# Patient Record
Sex: Female | Born: 1970 | Race: Black or African American | Hispanic: No | Marital: Single | Smoking: Never smoker
Health system: Southern US, Community
[De-identification: ages and names within clinical notes are randomized; demographics above are authoritative.]

## PROBLEM LIST (undated history)

## (undated) DIAGNOSIS — Z800 Family history of malignant neoplasm of digestive organs: Secondary | ICD-10-CM

## (undated) DIAGNOSIS — E079 Disorder of thyroid, unspecified: Secondary | ICD-10-CM

## (undated) DIAGNOSIS — D259 Leiomyoma of uterus, unspecified: Secondary | ICD-10-CM

## (undated) DIAGNOSIS — Z975 Presence of (intrauterine) contraceptive device: Secondary | ICD-10-CM

## (undated) DIAGNOSIS — K219 Gastro-esophageal reflux disease without esophagitis: Secondary | ICD-10-CM

## (undated) DIAGNOSIS — Z803 Family history of malignant neoplasm of breast: Secondary | ICD-10-CM

## (undated) DIAGNOSIS — F209 Schizophrenia, unspecified: Secondary | ICD-10-CM

## (undated) DIAGNOSIS — E039 Hypothyroidism, unspecified: Secondary | ICD-10-CM

## (undated) HISTORY — DX: Family history of malignant neoplasm of digestive organs: Z80.0

## (undated) HISTORY — DX: Family history of malignant neoplasm of breast: Z80.3

---

## 2007-01-18 DIAGNOSIS — F319 Bipolar disorder, unspecified: Secondary | ICD-10-CM | POA: Insufficient documentation

## 2007-08-19 ENCOUNTER — Inpatient Hospital Stay: Payer: Self-pay | Admitting: Psychiatry

## 2009-01-04 DIAGNOSIS — D259 Leiomyoma of uterus, unspecified: Secondary | ICD-10-CM | POA: Insufficient documentation

## 2010-11-02 ENCOUNTER — Inpatient Hospital Stay: Payer: Self-pay | Admitting: Psychiatry

## 2010-12-02 DIAGNOSIS — R87610 Atypical squamous cells of undetermined significance on cytologic smear of cervix (ASC-US): Secondary | ICD-10-CM | POA: Insufficient documentation

## 2011-05-26 ENCOUNTER — Inpatient Hospital Stay: Payer: Self-pay | Admitting: Psychiatry

## 2011-05-31 LAB — TSH: Thyroid Stimulating Horm: 5.36 u[IU]/mL — ABNORMAL HIGH

## 2013-02-05 LAB — DRUG SCREEN, URINE
Cannabinoid 50 Ng, Ur ~~LOC~~: NEGATIVE (ref ?–50)
Cocaine Metabolite,Ur ~~LOC~~: NEGATIVE (ref ?–300)
MDMA (Ecstasy)Ur Screen: NEGATIVE (ref ?–500)
Methadone, Ur Screen: NEGATIVE (ref ?–300)
Opiate, Ur Screen: NEGATIVE (ref ?–300)
Tricyclic, Ur Screen: NEGATIVE (ref ?–1000)

## 2013-02-05 LAB — CBC
HCT: 42.5 % (ref 35.0–47.0)
HGB: 15.1 g/dL (ref 12.0–16.0)
MCH: 31.3 pg (ref 26.0–34.0)
RBC: 4.82 10*6/uL (ref 3.80–5.20)
RDW: 14.3 % (ref 11.5–14.5)
WBC: 8.1 10*3/uL (ref 3.6–11.0)

## 2013-02-05 LAB — URINALYSIS, COMPLETE
Ketone: NEGATIVE
Ph: 7 (ref 4.5–8.0)
Protein: NEGATIVE
Specific Gravity: 1.002 (ref 1.003–1.030)
Squamous Epithelial: 12
WBC UR: 11 /HPF (ref 0–5)

## 2013-02-05 LAB — COMPREHENSIVE METABOLIC PANEL
Alkaline Phosphatase: 151 U/L — ABNORMAL HIGH (ref 50–136)
Anion Gap: 9 (ref 7–16)
Bilirubin,Total: 0.6 mg/dL (ref 0.2–1.0)
Calcium, Total: 9.7 mg/dL (ref 8.5–10.1)
Creatinine: 0.74 mg/dL (ref 0.60–1.30)
EGFR (African American): >60
EGFR (Non-African Amer.): >60
Osmolality: 255 (ref 275–301)
SGPT (ALT): 22 U/L (ref 12–78)
Sodium: 129 mmol/L — ABNORMAL LOW (ref 136–145)

## 2013-02-05 LAB — TSH: Thyroid Stimulating Horm: 1.96 u[IU]/mL

## 2013-02-05 LAB — ETHANOL: Ethanol: <3 mg/dL

## 2013-02-06 LAB — VALPROIC ACID LEVEL: Valproic Acid: 85 ug/mL

## 2013-02-07 LAB — AMMONIA: Ammonia, Plasma: <25 mcmol/L (ref 11–32)

## 2013-02-08 ENCOUNTER — Inpatient Hospital Stay: Payer: Self-pay | Admitting: Psychiatry

## 2013-02-12 LAB — POTASSIUM: Potassium: 3.6 mmol/L (ref 3.5–5.1)

## 2013-11-11 LAB — CBC
HCT: 39.3 % (ref 35.0–47.0)
HGB: 13.4 g/dL (ref 12.0–16.0)
MCH: 30.8 pg (ref 26.0–34.0)
MCHC: 34.2 g/dL (ref 32.0–36.0)
MCV: 90 fL (ref 80–100)
Platelet: 295 10*3/uL (ref 150–440)
RBC: 4.37 10*6/uL (ref 3.80–5.20)
RDW: 13.8 % (ref 11.5–14.5)
WBC: 8.9 10*3/uL (ref 3.6–11.0)

## 2013-11-11 LAB — COMPREHENSIVE METABOLIC PANEL
ALK PHOS: 111 U/L
Albumin: 3.8 g/dL (ref 3.4–5.0)
Anion Gap: 10 (ref 7–16)
BUN: 2 mg/dL — ABNORMAL LOW (ref 7–18)
Bilirubin,Total: 0.3 mg/dL (ref 0.2–1.0)
CALCIUM: 9.1 mg/dL (ref 8.5–10.1)
CREATININE: 0.8 mg/dL (ref 0.60–1.30)
Chloride: 90 mmol/L — ABNORMAL LOW (ref 98–107)
Co2: 25 mmol/L (ref 21–32)
EGFR (African American): >60
EGFR (Non-African Amer.): >60
Glucose: 132 mg/dL — ABNORMAL HIGH (ref 65–99)
OSMOLALITY: 250 (ref 275–301)
Potassium: 2.6 mmol/L — ABNORMAL LOW (ref 3.5–5.1)
SGOT(AST): 25 U/L (ref 15–37)
SGPT (ALT): 18 U/L (ref 12–78)
SODIUM: 125 mmol/L — AB (ref 136–145)
TOTAL PROTEIN: 8.1 g/dL (ref 6.4–8.2)

## 2013-11-11 LAB — URINALYSIS, COMPLETE
BILIRUBIN, UR: NEGATIVE
Bacteria: NONE SEEN
Glucose,UR: NEGATIVE mg/dL (ref 0–75)
KETONE: NEGATIVE
Nitrite: NEGATIVE
PH: 7 (ref 4.5–8.0)
PROTEIN: NEGATIVE
RBC,UR: 1 /HPF (ref 0–5)
Specific Gravity: 1.001 (ref 1.003–1.030)
Squamous Epithelial: 3

## 2013-11-11 LAB — DRUG SCREEN, URINE
Amphetamines, Ur Screen: NEGATIVE (ref ?–1000)
BENZODIAZEPINE, UR SCRN: NEGATIVE (ref ?–200)
Barbiturates, Ur Screen: NEGATIVE (ref ?–200)
COCAINE METABOLITE, UR ~~LOC~~: NEGATIVE (ref ?–300)
Cannabinoid 50 Ng, Ur ~~LOC~~: NEGATIVE (ref ?–50)
MDMA (Ecstasy)Ur Screen: NEGATIVE (ref ?–500)
Methadone, Ur Screen: NEGATIVE (ref ?–300)
Opiate, Ur Screen: NEGATIVE (ref ?–300)
Phencyclidine (PCP) Ur S: NEGATIVE (ref ?–25)
Tricyclic, Ur Screen: NEGATIVE (ref ?–1000)

## 2013-11-11 LAB — SALICYLATE LEVEL: Salicylates, Serum: <1.7 mg/dL

## 2013-11-11 LAB — ACETAMINOPHEN LEVEL: Acetaminophen: <2 ug/mL

## 2013-11-11 LAB — ETHANOL: Ethanol: <3 mg/dL

## 2013-11-12 ENCOUNTER — Inpatient Hospital Stay: Payer: Self-pay | Admitting: Psychiatry

## 2013-11-12 LAB — PREGNANCY, URINE: Pregnancy Test, Urine: NEGATIVE m[IU]/mL

## 2013-11-14 LAB — BASIC METABOLIC PANEL
Anion Gap: 9 (ref 7–16)
BUN: 3 mg/dL — ABNORMAL LOW (ref 7–18)
CREATININE: 0.88 mg/dL (ref 0.60–1.30)
Calcium, Total: 9.6 mg/dL (ref 8.5–10.1)
Chloride: 97 mmol/L — ABNORMAL LOW (ref 98–107)
Co2: 29 mmol/L (ref 21–32)
EGFR (African American): >60
Glucose: 117 mg/dL — ABNORMAL HIGH (ref 65–99)
OSMOLALITY: 268 (ref 275–301)
Potassium: 3.9 mmol/L (ref 3.5–5.1)
SODIUM: 135 mmol/L — AB (ref 136–145)

## 2014-09-15 NOTE — Consult Note (Signed)
Brief Consult Note: Diagnosis: Schizoaffective disorder bipolar type.   Patient was seen by consultant.   Consult note dictated.   Recommend further assessment or treatment.   Orders entered.   Comments: Tamara [name] has a h/o psychosis and mood instability on Haldol decanoate injections and depakote. She came to the hospital floridly psychotic in spite of good treatment compliance as evidenced by therapeutic VPA level.   PLAN: 1. Mood/Psychosis-.we continue depakote. 100 mg of Haldol decanoate injection due on 9/24. Will add Zyprexa zydid to address psychosis.   2. UTI- it is not impossible that some symptoms stem from it. She is on Cipro.   3. Hyponatremia-per mother, she drinks a lot of fluids. This can be from Depakote. We will limit fluids.   4. Low potassium-KCl supplementation per ER physician.  5. Slurred speech-could be from EPS, high Ammonia or reportedly, dental work. Ammonia level.  6. Psychiatry will follow along.  Electronic Signatures: Orson Slick (MD)  (Signed 15-Sep-14 16:03)  Authored: Brief Consult Note   Last Updated: 15-Sep-14 16:03 by Orson Slick (MD)

## 2014-09-15 NOTE — Consult Note (Signed)
Brief Consult Note: Diagnosis: Schizoaffective disorder bipolar type.   Patient was seen by consultant.   Recommend further assessment or treatment.   Orders entered.   Comments: Pt seen in ED [facility]. She has h/o psychosis and mood instability on Haldol decanoate injections and depakote. She was floridly psychotic and was started on Haldol Dec to control her paranoia. She remains delusional and she has poor insight about her illness.   PLAN: Pt will be admitted to [facility].  Continue meds.  She will receive Haldol Dec on Sept 24th.  Continue Depakote and Zyprexa Zydis.  Staff to monitor her closely.  Electronic Signatures: Jeronimo Norma (MD)  (Signed 16-Sep-14 10:51)  Authored: Brief Consult Note   Last Updated: 16-Sep-14 10:51 by Jeronimo Norma (MD)

## 2014-09-16 NOTE — Discharge Summary (Signed)
PATIENT NAME:  Tamara [name], Tamara [name] MR#:  [id number] DATE OF BIRTH:  February 02, 1971  DATE OF ADMISSION:  11/12/2013 DATE OF DISCHARGE:  11/15/2013  HOSPITAL COURSE: See dictated history and physical for details of admission. A 44 year old woman with a history of schizophrenia and mild mental retardation who comes into the hospital committed by her family. Apparently, she had been increasingly belligerent and bizarre. When first seen in the Emergency Room, she was disorganized and confused and bizarre. She was started back on her usual medication which she has tolerated fine. Here in the hospital, she has been showing no side effects and has been compliant with medication treatment. She has been treated with medication as well as individual and group psychotherapy. She has shown a significant improvement in her symptoms. She is now able to be coherent and lucid in conversation and is not expressing any hostility. Her insight is improved and she agrees to continuing on her current medicine. It is still unclear to me when her last shot of Haldol decanoate was but she should continue getting that on her regular schedule. Otherwise, she should continue her Depakote and olanzapine and Cogentin as she is taking it currently. The patient was counseled about the importance of staying on her medicine and agrees to this and is going to follow up with [place].   MENTAL STATUS EXAM AT DISCHARGE: Casually dressed, reasonably well-groomed woman, looks her stated age, cooperative with the interview. Good eye contact. Normal psychomotor activity. Speech is a little bit rapid but easy to understand. Mood is stated as being good. Thoughts are lucid. No evidence of loosening of associations or delusions. Denies auditory or visual hallucinations. Denies suicidal or homicidal ideation. Shows improved judgment and insight. Intelligence is low average. Short and long-term memory some chronic impairment. Alert and oriented x 4.   DISCHARGE  MEDICATIONS: Depakote 250 mg 3 times a day, Cogentin 0.5 mg twice a day, Haldol decanoate 100 mg intramuscular every 4 weeks on her usual schedule, olanzapine 5 mg in the morning and 15 mg at night, hydroxyzine 50 mg every 6 hours as needed for anxiety, Zantac 150 mg twice a day, levothyroxine 75 mcg once a day, potassium chloride 20 mEq twice a day.   LABORATORY RESULTS: Most recent chemistry panel shows (Dictation Anomaly) <<sodiumMISSING TEXT>> normalizing at 135, potassium normal, (Dictation Anomaly) <<chlorideMISSING TEXT>> 97, glucose 117. Admission chemistry showed low sodium at 125, probably from excessive water drinking, potassium was low at 2.6. Alcohol was negative. Drug screen negative. CBC unremarkable. Urinalysis 1+ leukocyte esterase, no bacteria.   DISPOSITION: The patient will be discharged home with her family and will follow up with [place].   DIAGNOSIS, PRINCIPAL AND PRIMARY:  AXIS I: Schizophrenia, undifferentiated.   SECONDARY DIAGNOSES: AXIS I: No further.  AXIS II: Mild mental retardation.  AXIS III: Chronic gastric discomfort, heartburn. Hypothyroid. 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  AXIS IV: Moderate from chronic illness.  AXIS V: Functioning at time of discharge 55.   ____________________________ Gonzella Lex, MD jtc:cs D: 11/15/2013 44:81:85 ET T: 11/15/2013 20:22:14 ET JOB#: [id number]  cc: Gonzella Lex, MD, <Dictator> Gonzella Lex MD ELECTRONICALLY SIGNED 11/15/2013 22:21

## 2014-09-16 NOTE — Consult Note (Signed)
PATIENT NAME:  Tamara [name], Tamara [name] MR#:  [id number] DATE OF BIRTH:  1970-10-29  DATE Seen   11/13/2013  CONSULTING PHYSICIAN:  Surya K. Challa, MD  SUBJECTIVE: The patient was seen for followup today in [place]. The patient is a 44 year old African American female with a long history of mental illness and had been on [place] on many occasions. This time she was brought back and according to information obtained, she stated that she came to Emergency Room to see her father Roe Rutherford. 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 Today, patient reports that she gave a call to her mother and she was taking a nap and she smiled while talking about the same. She has been compliant with medications and she already states she feels better now.  OBJECTIVE: The patient is still dressed in hospital scrubs, grooming is below average, alert and oriented to place, person, and time with a little prompting and help. Affect is bright and better and she reports that she rested better. This makes her feel better. No agitation. Denies hearing voices and said, "No ma'am." Denies any visual hallucinations and said, "No ma'am." She is glad that she is getting help and she is being helped and she then said, "I want to go home," and started laughing. Cognition is much below average. Recall memory is poor but still she is cooperative. Admits she is resting better and eating better. Denies any suicidal ideas  or plans and realizes the importance of taking her medications. Insight and judgment guarded. Impulse control is poor.   IMPRESSION: Schizoaffective disorder with psychosis.  PLAN: Continue current medications. I talked to the patient about importance with compliance and discussed about coping skills.   ____________________________ Wallace Cullens. Franchot Mimes, MD skc:lt D: 11/13/2013 20:26:59  ET T: 11/13/2013 22:12:23 ET JOB#: [id number]  cc: Arlyn Leak K. Franchot Mimes, MD, <Dictator> Dewain Penning MD ELECTRONICALLY SIGNED 11/14/2013 9:29

## 2014-09-16 NOTE — H&P (Signed)
PATIENT NAME:  Tamara [name], Tamara [name] MR#:  [id number] DATE OF BIRTH:  1971-01-29  DATE OF ADMISSION:  11/12/2013  SEX: Female.  RACE: African American.  INITIAL ASSESSMENT PSYCHIATRIC EVALUATION   IDENTIFYING INFORMATION: The patient is a 44 year old African American female with a long history of mental illness and had been at [facility] on several occasions for the same. The patient is a poor historian and she stated that "The police came and handcuffed me and brought me here. They said they do that to everybody." She put her hands behind her back and she showed me  how they handcuffed her and brought her here. According to information obtained from the chart, patient stated to the emergency room that she came to the ER to see her father, Roe Rutherford. She is agitated at home. 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 According to information obtained from the Emergency Room note, the patient is clearly psychotic and a history of similar episodes in the past and was brought to the hospital on IVC.   HISTORY OF PRESENT ILLNESS: The patient reports that she was just handcuffed and is a poor historian. According to information obtained from IVC, she has hallucinations, mentally unstable and has schizophrenia and she needs help.   PAST PSYCHIATRIC HISTORY: The patient has a long history of mental illness and has been hospitalized for psychosis on many occasions. She was last hospitalized at [facility] in September of 2014 and then she was  psychotic. The first hospitalization was as a teenager at age 37 years. This is her fifth inpatient hospital hold on psychiatry at [facility]   MEDICATIONS: She has been on various medications and in September of 2014 she was discharged on the following medications:  1. Valproic acid 250 mg. 2. depakote 15 mL orally once a day at bedtime for mood stabilization. 3. Cogentin 0.5 mg 1 tablet twice a day for eps 4.  Haldol 100 mg IM once a month for psychosis. 5. Zyprexa 15 mg 1 at bedtime. 6. Vistaril 15 mg 1 p.o. q. 6 hours p.r.n. for anxiety. 7. Ranitidine 15 mg twice a day. 8. Levothyroxine 75 mcg 1 tablet once a day for hypothyroidism.   The patient reports that her mother gives her the medicine and she takes the medicine as she lives with her parents. She gets her Haldol 100 mg IM and her mother gives it to her at home. Patient reports that her psychiatrist is a [place] man who is a very good man and does not know his name and she reports that her next appointment is coming up on Wednesday, that is 11/16/2013 and she said she is going to get a shot at the clinic. She was wrong when she stated her mother gives it. She said that the nurse at the mental health clinic gives it to her. Mother goes and picks up the medicine from the drug store and then she takes the medication to doctor. According to information obtained from the chart, the patient was under the care of Dr. Sherlynn Stalls at [facility]. No history of suicide attempts.   FAMILY HISTORY OF MENTAL ILLNESS: None reported.   FAMILY HISTORY: Raised by parents. Currently she lives with her mother and father and she says they do not abuse her. They take good care of her, give her the medications. When they do not feel like cooking at home, they go to the shelter and eat food. Admits that she graduated  from high school, was in special classes, and never liked school.  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 She quit the job because she moved.  MARRIAGES: Never married.  CHILDREN: No children.  ALCOHOL AND DRUGS: Denies drinking alcohol. Denies street or prescription drug abuse. Denies smoking nicotine cigarettes.  MEDICAL HISTORY: Hypothyroidism and is on medication for the same. GERD and is on medications for the same. No known history of hypertension. No history of diabetes mellitus.  No major surgeries, no major injuries. 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  ALLERGIES: No known drug allergies.  PRIMARY CARE PHYSICIAN: She does not know the name of the primary care physician and said the current one is gone and she going to get a new one.  PHYSICAL EXAMINATION: VITALS SIGNS: Temperature is 97.4, pulse is 74, rhythm regular. Respirations 20 per minute and regular. Blood pressure is 122/80 mmHg.  HEENT: Head is normocephalic, atraumatic. Eyes: PERRLA, fundi are benign. NECK: Supple without any thyromegaly, lymphadenopathy.  CHEST: Normal expansion, normal breath sounds heard.  HEART: Normal S1 without any murmurs or rubs. ABDOMEN: Soft, no organomegaly. Bowel sounds heard. RECTAL: Deferred. PELVIC: Deferred. NEUROLOGIC: Gait is normal. Romberg is negative. Cranial nerves II-XII are grossly intact and normal. DTRs 2+ and normal.   MENTAL STATUS EXAMINATION: The patient is dressed in hospital scrubs. Alert and oriented to person and place. Not exactly to date. She said that she did not know the year and she did not know the date and she said "I don't know, I can't tell you that." She is exactly uncertain about the situation that brought her and all she knows is she was handcuffed and brought here. She is pleasant and cooperative. Makes good eye contact. Speech is normal in rate and rhythm and smiling and cheerful. She denies auditory or visual hallucinations. Denies people being against her. Denies  history of suicidal or homicidal plans and said, "People are always nice to me." She is probably delusional about certain things. Cognition is much below average. Recall and memory is poor and she could recall 1 of the 3 objects and said she cannot do things like that. She said capital of [place] is "[place]" and she says she is from [place], [place], so she did not known it and she did not know the capital of this  country. She cannot do any spelling. She said 4 quarters and 6 dimes and she said there are lots of nickels and that there were 5 nickels in a dollar and there is bunch of pennies. Denies s/h ideas or plans and contracts for safety. I/J guarded. Impulse control is poor.  Imp SAD with psychosis.       Mild Mental disabilities.       Hypothyroid and GERD. REc Start [pt back on her meds and stabilize. Will participate in individual and Group therapy for support and coping skills. Discharge back home to parents when medically and mentally stable with appropriate apts.    ____________________________ Wallace Cullens. Franchot Mimes, MD skc:lt D: 11/12/2013 20:43:32 ET T: 11/12/2013 21:06:28 ET JOB#: [id number]  cc: Arlyn Leak K. Franchot Mimes, MD, <Dictator> Dewain Penning MD ELECTRONICALLY SIGNED 11/13/2013 20:43

## 2014-09-17 NOTE — H&P (Signed)
PATIENT NAME:  Tamara [name], Tamara [name] MR#:  [id number] DATE OF BIRTH:  06-Nov-1970  DATE OF ADMISSION:  05/26/2011  REFERRING PHYSICIAN: Lenise Arena, MD  ATTENDING PHYSICIAN: Orson Slick, MD   IDENTIFYING DATA: Tamara [name] is a 44 year old female with a history of psychosis.   CHIEF COMPLAINT: "I just try to help."  HISTORY OF PRESENT ILLNESS: Tamara [name] was petitioned by her mother for reportedly threatening behavior. The patient is rather disorganized and unable to provide details. She tells me that she did see some Christmas presents in the room, including two flat screen TVs and other items.  They all disappeared, and they are now in [place] in her sister's place. It is unclear why she became upset about Christmas gifts and whether or not this was delusional. I do not have any details about her behavior, and I am not certain at this point whether she was physical with her mother or not. We will continue to attempt to contact the family for details. The patient reports that she has been compliant with her visits to Dr. Leonides Schanz and her medications. She "takes her pill and drinks her liquids." She does not remember the names of her medicines but apparently does not mind taking them at all. In addition, the patient is given and an injection of Prolixin every three weeks which she reminds me of and has no problem to continue on medications. Her Depakote level on admission was within low therapeutic range of 58, confirming her story. The patient is very appropriate on the unit. There are no behavioral problems. She takes medications as prescribed, is very pleasant and polite with staff and peers. She denies any symptoms of depression, anxiety or psychosis. She denies hearing any voices. Her story is unlikely true. She denies drinking alcohol, prescription pill abuse, or illicit substance use.   PAST PSYCHIATRIC HISTORY: She has been hospitalized for psychosis several times. The first hospitalization  was at the age of 58. She was admitted to [facility] three times. Her last hospitalization was in June of 2012. We do not know her medication story, but apparently she has been doing well on a combination of Depakote, Haldol and Prolixin. She is compliant with her visits to Dr. Leonides Schanz. She denies suicide attempt. She does have poor insight into mental illness.   FAMILY PSYCHIATRIC HISTORY: None reported.   PAST MEDICAL HISTORY:  1. Polydipsia.  2. Hypothyroidism. 3. Gastroesophageal reflux disease.   MEDICATIONS ON ADMISSION:  1. Synthroid 75 mcg daily.  2. Cogentin 0.5 b.i.d.   3. Ranitidine 150 mg b.i.d.    4. Depakote syrup 250 mg t.i.d.   5. Haldol 5 mg t.i.d.   6. Hydroxyzine 50 mg, 3 to 4 times daily as needed.  7. Prolixin 25 mg IM every 3 weeks. The patient is uncertain when the last injection was given.   ALLERGIES: No known drug allergies.   SOCIAL HISTORY: She is disabled from psychosis. She lives with her mother. She was briefly employed prior to development of her symptoms. She reports that she was placed in a group home right after she graduated from high school. She did not like it there and felt that people were mean to her. She is perfectly happy living with her mother. Apparently, she is quite low functioning.   REVIEW OF SYSTEMS: CONSTITUTIONAL: No fevers or chills. No weight changes. EYES: No double or blurred vision. ENT: No hearing loss. RESPIRATORY: No shortness of breath or cough. CARDIOVASCULAR: No chest pain  or orthopnea. GASTROINTESTINAL: No abdominal pain, nausea, vomiting, or diarrhea. GU: No incontinence or frequency. ENDOCRINE: No heat or cold intolerance. LYMPHATIC: No anemia or easy bruising. INTEGUMENTARY: No acne or rash. MUSCULOSKELETAL: No muscle or joint pain. NEUROLOGICAL: No tingling or weakness. PSYCHIATRIC: See history of present illness for details.   PHYSICAL EXAMINATION:  VITAL SIGNS: Blood pressure 108/67, pulse 106,  respirations 20, temperature 95.7.   GENERAL: This is a well-developed female in no acute distress.   HEENT: The pupils are equal, round, and reactive to light.  NECK: Supple. No thyromegaly.   LUNGS: Clear to auscultation. No dullness to percussion.   HEART: Regular rhythm and rate. No murmurs, rubs, or gallops.   ABDOMEN: Soft, nontender, nondistended. Positive bowel sounds.   MUSCULOSKELETAL: Normal muscle strength in all extremities. No stiffness or cogwheeling. Normal muscle tone.   SKIN: No rashes or bruises.   LYMPHATIC: No cervical adenopathy.   NEUROLOGIC: Cranial nerves II through XII are intact. Normal gait. Slight resting tremor.  LABORATORY, DIAGNOSTIC AND RADIOLOGICAL DATA:  Chemistries are within normal limits except for sodium of 130. Blood alcohol level is zero.  LFTs are within normal limits.  TSH 4.68. Depakote level 58.  Urine toxicology screen  negative for substances.  CBC within normal limits.  Urinalysis is not suggestive of urinary tract infection.  Pregnancy test in urine was negative.   MENTAL STATUS EXAMINATION ON ADMISSION: The patient is alert and oriented to person and place. She knows it is December. She does not know the exact date. She is clearly confused about the situation. She is pleasant, polite, and cooperative. She is well groomed and casually dressed. She maintains good eye contact. She keeps combing her hair, that is a little overgrown, as we speak. She makes a comment that she needs to get a haircut. Speech is of normal rhythm, rate, and volume. Mood is fine with an odd affect. Thought processing is influenced by her delusional thinking with its own logic. Thought content: She denies suicidal or homicidal ideation and adamantly denies threatening her mother at home. She is delusional and slightly paranoid. She denies auditory or visual hallucinations. Her cognition is impaired. She registers two out of three and recalls two out of three with  help. She cannot name the President. She does not want to spell. Her insight and judgment seem poor.  SUICIDE RISK ASSESSMENT ON ADMISSION: This is a patient with a long history of psychosis but not suicide attempts who reportedly threatened her family at home.   AXIS I: Schizoaffective disorder, bipolar type, versus paranoid schizophrenia.   AXIS II: Deferred.   AXIS III:  1. Hypothyroidism.  2. Gastroesophageal reflux disease. 3. Polydipsia.   AXIS IV: Mental illness, conflict at home.   AXIS V: Global Assessment of Functioning score is 25.   PLAN: The patient was admitted to  Unit for safety, stabilization and medication management. She was initially placed on suicide precautions and was closely monitored for any unsafe behaviors. She underwent full psychiatric and risk assessment. She received pharmacotherapy, individual and group psychotherapy, substance abuse counseling, and support from therapeutic milieu.   1. Violent behavior: This has resolved. The patient is able to contract for safety.  2. Mood and psychosis: We will continue Depakote and Haldol. We will get in touch with Dr. Leonides Schanz to establish if she is due for her next antipsychotic injection.  3. We will continue all medications as prescribed by her primary provider.  4.  Disposition: It is unclear if the patient is allowed to return to her mother. We will obtain collateral data.   ____________________________ Wardell Honour. Bary Leriche, MD jbp:cbb D: 05/26/2011 15:40:24 ET T: 05/26/2011 16:04:52 ET JOB#: [id number]  cc: Alera Quevedo B. Bary Leriche, MD, <Dictator> Clovis Fredrickson MD ELECTRONICALLY SIGNED 06/04/2011 23:35

## 2014-10-03 NOTE — H&P (Signed)
PATIENT NAME:  Tamara [name], Tamara [name] [id number] OF BIRTH:  10/09/1970 OF ADMISSION:  02/08/2013  REFERRING PHYSICIAN: Emergency Room MDPHYSICIAN: Orson Slick, MD  DATA: Tamara [name] is a 44 year old female with a history of schizoaffective disorder.  COMPLAINT: Patient is unable to state.  OF PRESENT ILLNESS: Tamara [name] returns to the hospital floridly psychotic. The mother believes that the patient has not been taking her medications as prescribed for the past week and became disorganized, delusional, believing that Roe Rutherford is her father, and hallucinating. The patient is in the care of Dr. Macy Mis and usually takes her medications without problems when supervised. She has been maintained on Haldol decanoate shots and liquid Depakote. Her Depakote level is therapeutic. Her next shot of 100 mg of Haldol decanoate on 02/16/2013. The patient is not able to provide any details. She does not remember names of her medications due to thought disorganization but she reports good treatment compliance. The patient denies any symptoms of depression or anxiety. She denies having voices but by her mother?s report she has been talking to herself. She has not been sleeping at night. The patient was admitted to [facility] in 2012 under similar circumstances most likely in a hypomanic episode. The patient denies drinking alcohol, prescription pill or illicit substance use.  PSYCHIATRIC HISTORY: She has been hospitalized for psychosis several times. The first hospitalization was at the age of 64. She was admitted to [facility] three times. Her last hospitalization was in June of 2012. She has been tried on Depakote, Haldol and Prolixin. There were no suicide attempts. She is compliant with her visits to [facility].   PSYCHIATRIC HISTORY: None reported.  MEDICAL HISTORY:  1. Polydipsia.  2. Hypothyroidism. Gastroesophageal reflux disease.  ON ADMISSION:  1. Synthroid 75 mcg daily.  2. Cogentin 0.5  twice daily.   Ranitidine syrup 150 mg twice daily.    Depakote syrup 750 mg at bedtime.    Haldol 0.5 mg twice daily.   Hydroxyzine 50 mg 4 times daily as needed.  Haldol decanoate 100 mg every 4 weeks. Next injection on 02/16/2013.   No known drug allergies.  HISTORY: She is disabled from psychosis. She lives with her mother. She was briefly employed prior to development of her symptoms. She reports that she was placed in a group home right after she graduated from high school. She did not like it there and felt that people were mean to her. She is perfectly happy living with her mother. Apparently, she is quite low functioning.  OF SYSTEMS: No fevers or chills. No weight changes. No double or blurred vision. No hearing loss. No shortness of breath or cough. No chest pain or orthopnea. No abdominal pain, nausea, vomiting, or diarrhea. No incontinence or frequency. No heat or cold intolerance. No anemia or easy bruising. No acne or rash. No muscle or joint pain. No tingling or weakness. See history of present illness for details.  EXAMINATION: SIGNS: Blood pressure 127/69, pulse 102, respirations 20, temperature 99.3. GENERAL: This is a well-developed female in no acute distress. The pupils are equal, round, and reactive to light. Supple. No thyromegaly. Clear to auscultation. No dullness to percussion. Regular rhythm and rate. No murmurs, rubs, or gallops. Soft, nontender, nondistended. Positive bowel sounds. MUSCULOSKELETAL: Normal muscle strength in all extremities. No stiffness or cogwheeling. Normal muscle tone. No rashes or bruises. No cervical adenopathy. Cranial nerves II through XII are intact. Normal gait.  DIAGNOSTIC AND RADIOLOGICAL DATA: Chemistries are within normal limits except for  sodium of 129, potassium 2.9. Blood ammonia 25.  Blood alcohol level is zero. LFTs are within normal limits except AP 151. TSH 1.96. VPA level 85. Urine toxicology screen negative for substances. CBC within normal  limits. Urinalysis is suggestive of urinary tract infection. Pregnancy test in urine negative.  STATUS EXAMINATION ON ADMISSION: The patient is alert and oriented to person and place. She knows it is fall, September. She does not know the exact date. She is uncertain about the situation. She recognizes me from previous admission. She is pleasant, polite, and cooperative. She is well groomed wearing hospital scrubs. She maintains good eye contact. Speech is of normal rhythm, rate, and volume. Mood is "fine" with odd affect. Thought process is influenced by delusional thinking with its own logic. Thought content: She denies suicidal or homicidal ideation. She is delusional and slightly paranoid. She denies auditory or visual hallucinations. Her cognition is impaired. She registers two out of three and recalls two out of three with help. She cannot name the President. She does not want to spell. Her insight and judgment seem poor.  RISK ASSESSMENT ON ADMISSION: This is a patient with a long history of psychosis but not suicide attempts who return to the hospital floridly psychotic apparently in spite of good medication compliance.  DIAGNOSES:I:  Schizoaffective disorder bipolar type.II:  Deferred. III:  Hypothyroidism, Gastroesophageal reflux disease, Polydipsia. IV:  Mental illness.  V:  Global Assessment of Functioning on admission 35.  The patient was admitted to [facility] for safety, stabilization and medication management. She was initially placed on suicide precautions and was closely monitored for any unsafe behaviors. She underwent full psychiatric and risk assessment. She received pharmacotherapy, individual and group psychotherapy, substance abuse counseling, and support from therapeutic milieu.   1. Mood/psychosis: We continued Depakote, VPA level was therapeutic. We will likely give haldol decanoate injection every 3 weeks instead every 4 weeks rather  than to increase the dose. She was started on Zyprexa zydis in the ER for additional antipsychotic and mood stabilizing effects.  Hyponatremia/Hypokalemia: This is likely from a combination of potomania and sodium lowering effect of Depakote. We started potassium supplementation. Will attempt to limit fluid intake.   Medical: We continue Synthroid and Ranitidine as prescribed by her primary provider.   UTI: She was started on Cipro in the ER.    Social: The patient is incompetent adult and her mother, Steve Gregg is the guardian.   Disposition: She will return to home with the mother. She will follow up with [facility].    Electronic Signatures: Orson Slick (MD)  (Signed on 17-Sep-14 03:23)  Authored  Last Updated: 17-Sep-14 03:23 by Orson Slick (MD)

## 2015-02-13 DIAGNOSIS — E031 Congenital hypothyroidism without goiter: Secondary | ICD-10-CM | POA: Diagnosis present

## 2015-03-05 DIAGNOSIS — Z3042 Encounter for surveillance of injectable contraceptive: Secondary | ICD-10-CM | POA: Insufficient documentation

## 2015-09-09 ENCOUNTER — Encounter: Payer: Self-pay | Admitting: *Deleted

## 2015-09-09 ENCOUNTER — Emergency Department
Admission: EM | Admit: 2015-09-09 | Discharge: 2015-09-10 | Disposition: A | Discharge status: Other Acute Inpatient Hospital | Payer: Medicaid Other | Attending: Emergency Medicine | Admitting: Emergency Medicine

## 2015-09-09 DIAGNOSIS — F200 Paranoid schizophrenia: Secondary | ICD-10-CM

## 2015-09-09 DIAGNOSIS — F203 Undifferentiated schizophrenia: Secondary | ICD-10-CM | POA: Diagnosis not present

## 2015-09-09 DIAGNOSIS — Z79899 Other long term (current) drug therapy: Secondary | ICD-10-CM | POA: Insufficient documentation

## 2015-09-09 DIAGNOSIS — F209 Schizophrenia, unspecified: Secondary | ICD-10-CM | POA: Diagnosis not present

## 2015-09-09 DIAGNOSIS — R451 Restlessness and agitation: Secondary | ICD-10-CM | POA: Diagnosis present

## 2015-09-09 HISTORY — DX: Schizophrenia, unspecified: F20.9

## 2015-09-09 HISTORY — DX: Disorder of thyroid, unspecified: E07.9

## 2015-09-09 LAB — URINE DRUG SCREEN, QUALITATIVE (ARMC ONLY)
Amphetamines, Ur Screen: NOT DETECTED
BARBITURATES, UR SCREEN: NOT DETECTED
Benzodiazepine, Ur Scrn: NOT DETECTED
COCAINE METABOLITE, UR ~~LOC~~: NOT DETECTED
Cannabinoid 50 Ng, Ur ~~LOC~~: NOT DETECTED
MDMA (Ecstasy)Ur Screen: NOT DETECTED
METHADONE SCREEN, URINE: NOT DETECTED
OPIATE, UR SCREEN: NOT DETECTED
Phencyclidine (PCP) Ur S: NOT DETECTED
Tricyclic, Ur Screen: NOT DETECTED

## 2015-09-09 LAB — CBC WITH DIFFERENTIAL/PLATELET
Basophils Absolute: 0 10*3/uL (ref 0–0.1)
Basophils Relative: 0 %
EOS PCT: 1 %
Eosinophils Absolute: 0.1 10*3/uL (ref 0–0.7)
HCT: 39 % (ref 35.0–47.0)
Hemoglobin: 13.4 g/dL (ref 12.0–16.0)
LYMPHS ABS: 3 10*3/uL (ref 1.0–3.6)
LYMPHS PCT: 29 %
MCH: 28.4 pg (ref 26.0–34.0)
MCHC: 34.4 g/dL (ref 32.0–36.0)
MCV: 82.6 fL (ref 80.0–100.0)
MONO ABS: 1.3 10*3/uL — AB (ref 0.2–0.9)
Monocytes Relative: 13 %
Neutro Abs: 5.9 10*3/uL (ref 1.4–6.5)
Neutrophils Relative %: 57 %
PLATELETS: 282 10*3/uL (ref 150–440)
RBC: 4.73 MIL/uL (ref 3.80–5.20)
RDW: 13.8 % (ref 11.5–14.5)
WBC: 10.4 10*3/uL (ref 3.6–11.0)

## 2015-09-09 LAB — POCT PREGNANCY, URINE: PREG TEST UR: NEGATIVE

## 2015-09-09 LAB — COMPREHENSIVE METABOLIC PANEL
ALT: 27 U/L (ref 14–54)
AST: 34 U/L (ref 15–41)
Albumin: 4.5 g/dL (ref 3.5–5.0)
Alkaline Phosphatase: 95 U/L (ref 38–126)
Anion gap: 11 (ref 5–15)
CHLORIDE: 96 mmol/L — AB (ref 101–111)
CO2: 23 mmol/L (ref 22–32)
CREATININE: 0.84 mg/dL (ref 0.44–1.00)
Calcium: 9.7 mg/dL (ref 8.9–10.3)
GFR calc Af Amer: >60 mL/min (ref >60)
GFR calc non Af Amer: >60 mL/min (ref >60)
Glucose, Bld: 135 mg/dL — ABNORMAL HIGH (ref 65–99)
Potassium: 3.3 mmol/L — ABNORMAL LOW (ref 3.5–5.1)
SODIUM: 130 mmol/L — AB (ref 135–145)
Total Bilirubin: 0.7 mg/dL (ref 0.3–1.2)
Total Protein: 8 g/dL (ref 6.5–8.1)

## 2015-09-09 LAB — URINALYSIS COMPLETE WITH MICROSCOPIC (ARMC ONLY)
BACTERIA UA: NONE SEEN
BILIRUBIN URINE: NEGATIVE
Glucose, UA: NEGATIVE mg/dL
Ketones, ur: NEGATIVE mg/dL
Nitrite: NEGATIVE
Protein, ur: NEGATIVE mg/dL
Specific Gravity, Urine: 1.001 — ABNORMAL LOW (ref 1.005–1.030)
pH: 7 (ref 5.0–8.0)

## 2015-09-09 LAB — ETHANOL: Alcohol, Ethyl (B): <5 mg/dL (ref <5)

## 2015-09-09 LAB — ACETAMINOPHEN LEVEL: Acetaminophen (Tylenol), Serum: <10 ug/mL — ABNORMAL LOW (ref 10–30)

## 2015-09-09 LAB — SALICYLATE LEVEL: Salicylate Lvl: <4 mg/dL (ref 2.8–30.0)

## 2015-09-09 MED ORDER — SODIUM CHLORIDE 0.9 % IV BOLUS (SEPSIS)
1000.0000 mL | Freq: Once | INTRAVENOUS | Status: AC
  Administered 2015-09-10: 1000 mL via INTRAVENOUS

## 2015-09-09 MED ORDER — DIPHENHYDRAMINE HCL 50 MG/ML IJ SOLN
INTRAMUSCULAR | Status: AC
  Administered 2015-09-09: 50 mg via INTRAMUSCULAR
  Filled 2015-09-09: qty 1

## 2015-09-09 MED ORDER — LORAZEPAM 2 MG/ML IJ SOLN
INTRAMUSCULAR | Status: AC
  Administered 2015-09-09: 2 mg via INTRAMUSCULAR
  Filled 2015-09-09: qty 1

## 2015-09-09 MED ORDER — FOSFOMYCIN TROMETHAMINE 3 G PO PACK
3.0000 g | PACK | Freq: Once | ORAL | Status: AC
  Administered 2015-09-10: 3 g via ORAL
  Filled 2015-09-09: qty 3

## 2015-09-09 MED ORDER — POTASSIUM CHLORIDE CRYS ER 20 MEQ PO TBCR
40.0000 meq | EXTENDED_RELEASE_TABLET | Freq: Once | ORAL | Status: AC
  Administered 2015-09-10: 40 meq via ORAL
  Filled 2015-09-09: qty 2

## 2015-09-09 MED ORDER — HALOPERIDOL LACTATE 5 MG/ML IJ SOLN
INTRAMUSCULAR | Status: AC
  Administered 2015-09-09: 5 mg via INTRAMUSCULAR
  Filled 2015-09-09: qty 1

## 2015-09-09 NOTE — ED Provider Notes (Addendum)
[facility] Emergency Department Provider Note  ____________________________________________  Time seen: Approximately 11:13 PM  I have reviewed the triage vital signs and the nursing notes.   HISTORY  Chief Complaint Medical Clearance    HPI Tamara [name] is a 45 y.o. female brought to the ED by her mother with a chief complaint of agitation and requesting medication adjustment. Patient has a history of schizophrenia who has been experiencing labile mood swings and agitation as well as paranoia. Mother reports patient becomes unbalanced approximately once per year necessitating medication adjustments. Patient currently voices no medical concerns. Denies active SI/HI/AH/VH. Required IM sedation prior to my interview and exam.   Past Medical History  Diagnosis Date  . Thyroid disease   . Schizophrenia ([place])     There are no active problems to display for this patient.   History reviewed. No pertinent past surgical history.  Current Outpatient Rx  Name  Route  Sig  Dispense  Refill  . benztropine (COGENTIN) 0.5 MG tablet   Oral   Take 0.5 mg by mouth 2 (two) times daily.         . cetirizine (ZYRTEC) 10 MG tablet   Oral   Take 10 mg by mouth daily.         . haloperidol (HALDOL) 0.5 MG tablet   Oral   Take 0.5 mg by mouth 2 (two) times daily.         . haloperidol decanoate (HALDOL DECANOATE) 100 MG/ML injection   Intramuscular   Inject 100 mg into the muscle every 28 (twenty-eight) days.         . hydrOXYzine (ATARAX/VISTARIL) 50 MG tablet   Oral   Take 50 mg by mouth every 6 (six) hours as needed.         Marland Kitchen levothyroxine (SYNTHROID, LEVOTHROID) 75 MCG tablet   Oral   Take 75 mcg by mouth daily before breakfast.         . olanzapine zydis (ZYPREXA) 15 MG disintegrating tablet   Oral   Take 15 mg by mouth at bedtime.         . potassium chloride SA (K-DUR,KLOR-CON) 20 MEQ tablet   Oral   Take 20 mEq by mouth 2 (two)  times daily.         . ranitidine (ZANTAC) 15 MG/ML syrup   Oral   Take by mouth 2 (two) times daily. 2 teaspoon fulls         . Valproate Sodium (VALPROIC ACID) 250 MG/5ML SOLN   Oral   Take 45 mLs by mouth at bedtime.           Allergies Review of patient's allergies indicates no known allergies.  History reviewed. No pertinent family history.  Social History Social History  Substance Use Topics  . Smoking status: Never Smoker   . Smokeless tobacco: None  . Alcohol Use: No    Review of Systems  Constitutional: No fever/chills. Eyes: No visual changes. ENT: No sore throat. Cardiovascular: Denies chest pain. Respiratory: Denies shortness of breath. Gastrointestinal: No abdominal pain.  No nausea, no vomiting.  No diarrhea.  No constipation. Genitourinary: Negative for dysuria. Musculoskeletal: Negative for back pain. Skin: Negative for rash. Neurological: Negative for headaches, focal weakness or numbness. Psychiatric:Positive for agitation.  10-point ROS otherwise negative.  ____________________________________________   PHYSICAL EXAM:  VITAL SIGNS: ED Triage Vitals  Enc Vitals Group     BP 09/09/15 2049 167/91 mmHg     Pulse  Rate 09/09/15 2049 144     Resp 09/09/15 2049 24     Temp 09/09/15 2049 99 F (37.2 C)     Temp Source 09/09/15 2049 Oral     SpO2 09/09/15 2049 99 %     Weight 09/09/15 2049 202 lb (91.627 kg)     Height 09/09/15 2049 5\' 2"  (1.575 m)     Head Cir --      Peak Flow --      Pain Score --      Pain Loc --      Pain Edu? --      Excl. in [place]? --     Constitutional: Alert and oriented. Well appearing and in no acute distress. Eyes: Conjunctivae are normal. PERRL. EOMI. Head: Atraumatic. Nose: No congestion/rhinnorhea. Mouth/Throat: Mucous membranes are moist.  Oropharynx non-erythematous. Neck: No stridor.   Cardiovascular: Normal rate, regular rhythm. Grossly normal heart sounds.  Good peripheral  circulation. Respiratory: Normal respiratory effort.  No retractions. Lungs CTAB. Gastrointestinal: Soft and nontender. No distention. No abdominal bruits. No CVA tenderness. Musculoskeletal: No lower extremity tenderness nor edema.  No joint effusions. Neurologic:  Normal speech and language. No gross focal neurologic deficits are appreciated. No gait instability. Skin:  Skin is warm, dry and intact. No rash noted. Psychiatric: Mood and affect are flat. Speech and behavior are flat. Exhibiting flight of ideas.  ____________________________________________   LABS (all labs ordered are listed, but only abnormal results are displayed)  Labs Reviewed  COMPREHENSIVE METABOLIC PANEL - Abnormal; Notable for the following:    Sodium 130 (*)    Potassium 3.3 (*)    Chloride 96 (*)    Glucose, Bld 135 (*)    BUN <5 (*)    All other components within normal limits  CBC WITH DIFFERENTIAL/PLATELET - Abnormal; Notable for the following:    Monocytes Absolute 1.3 (*)    All other components within normal limits  ACETAMINOPHEN LEVEL - Abnormal; Notable for the following:    Acetaminophen (Tylenol), Serum <10 (*)    All other components within normal limits  URINALYSIS COMPLETEWITH MICROSCOPIC ([facility] ONLY) - Abnormal; Notable for the following:    Color, Urine STRAW (*)    APPearance CLEAR (*)    Specific Gravity, Urine 1.001 (*)    Hgb urine dipstick 2+ (*)    Leukocytes, UA 1+ (*)    Squamous Epithelial / LPF 0-5 (*)    All other components within normal limits  ETHANOL  SALICYLATE LEVEL  URINE DRUG SCREEN, QUALITATIVE ([facility] ONLY)  POC URINE PREG, ED  POCT PREGNANCY, URINE   ____________________________________________  EKG  None ____________________________________________  RADIOLOGY  None ____________________________________________   PROCEDURES  Procedure(s) performed: None  Critical Care performed: No  ____________________________________________   INITIAL  IMPRESSION / ASSESSMENT AND PLAN / ED COURSE  Pertinent labs & imaging results that were available during my care of the patient were reviewed by me and considered in my medical decision making (see chart for details).  45 year old female with a history of schizophrenia who presents for agitation. Patient required IM sedation upon her arrival. She is currently calm and cooperative. Laboratory urinalysis results remarkable for hyponatremia and mild hypokalemia; will initiate IV fluid resuscitation. Urinalysis reveals mild UTI for which we will administer fosfomycin. At this time patient is medically cleared for psychiatric evaluation and disposition.  ----------------------------------------- 8:14 AM on 09/10/2015 -----------------------------------------  Patient was transferred to [facility] pending psychiatry evaluation. No events overnight. Resting in no acute distress.  Disposition per psychiatry. ____________________________________________   FINAL CLINICAL IMPRESSION(S) / ED DIAGNOSES  Final diagnoses:  Schizophrenia, unspecified type ([place])      Paulette Blanch, MD 09/10/15 [place], MD 09/10/15 [phone]

## 2015-09-09 NOTE — ED Notes (Signed)
BEHAVIORAL HEALTH ROUNDING  Patient sleeping: No.  Patient alert and oriented: yes  Behavior appropriate: Yes. ; If no, describe:  Nutrition and fluids offered: Yes  Toileting and hygiene offered: Yes  Sitter present: not applicable, Q 15 min safety rounds and observation.  Law enforcement present: Yes ODS  

## 2015-09-09 NOTE — ED Notes (Signed)

## 2015-09-09 NOTE — ED Notes (Signed)
Pt has flight of ideas and is unable to answer health related questions. Pt is accompanied by mother. Pt is yelling about her children being taken from her, being unwilling to stay in ED, people taking her money. Pt is agitated and somewhat uncooperative. Mother reports hx of psychiatric conditions and that she has taken her home meds daily.

## 2015-09-10 ENCOUNTER — Inpatient Hospital Stay
Admission: EM | Admit: 2015-09-10 | Discharge: 2015-09-13 | DRG: 885 | Disposition: A | Discharge status: Home / Self Care | Payer: Medicaid Other | Source: Intra-hospital | Attending: Psychiatry | Admitting: Psychiatry

## 2015-09-10 DIAGNOSIS — F70 Mild intellectual disabilities: Secondary | ICD-10-CM | POA: Diagnosis present

## 2015-09-10 DIAGNOSIS — F200 Paranoid schizophrenia: Secondary | ICD-10-CM

## 2015-09-10 DIAGNOSIS — E031 Congenital hypothyroidism without goiter: Secondary | ICD-10-CM | POA: Diagnosis present

## 2015-09-10 DIAGNOSIS — F209 Schizophrenia, unspecified: Secondary | ICD-10-CM | POA: Diagnosis present

## 2015-09-10 DIAGNOSIS — F22 Delusional disorders: Secondary | ICD-10-CM | POA: Diagnosis present

## 2015-09-10 DIAGNOSIS — Z9114 Patient's other noncompliance with medication regimen: Secondary | ICD-10-CM

## 2015-09-10 DIAGNOSIS — L219 Seborrheic dermatitis, unspecified: Secondary | ICD-10-CM | POA: Diagnosis present

## 2015-09-10 DIAGNOSIS — R631 Polydipsia: Secondary | ICD-10-CM | POA: Diagnosis present

## 2015-09-10 DIAGNOSIS — Z975 Presence of (intrauterine) contraceptive device: Secondary | ICD-10-CM | POA: Diagnosis not present

## 2015-09-10 DIAGNOSIS — R4586 Emotional lability: Secondary | ICD-10-CM | POA: Diagnosis present

## 2015-09-10 DIAGNOSIS — E876 Hypokalemia: Secondary | ICD-10-CM | POA: Diagnosis present

## 2015-09-10 DIAGNOSIS — F203 Undifferentiated schizophrenia: Secondary | ICD-10-CM

## 2015-09-10 DIAGNOSIS — K219 Gastro-esophageal reflux disease without esophagitis: Secondary | ICD-10-CM | POA: Diagnosis present

## 2015-09-10 DIAGNOSIS — E221 Hyperprolactinemia: Secondary | ICD-10-CM | POA: Diagnosis present

## 2015-09-10 DIAGNOSIS — F54 Psychological and behavioral factors associated with disorders or diseases classified elsewhere: Secondary | ICD-10-CM

## 2015-09-10 DIAGNOSIS — E871 Hypo-osmolality and hyponatremia: Secondary | ICD-10-CM | POA: Diagnosis present

## 2015-09-10 HISTORY — DX: Leiomyoma of uterus, unspecified: D25.9

## 2015-09-10 HISTORY — DX: Gastro-esophageal reflux disease without esophagitis: K21.9

## 2015-09-10 HISTORY — DX: Presence of (intrauterine) contraceptive device: Z97.5

## 2015-09-10 MED ORDER — HALOPERIDOL 5 MG PO TABS
5.0000 mg | ORAL_TABLET | Freq: Two times a day (BID) | ORAL | Status: DC
  Administered 2015-09-10: 5 mg via ORAL

## 2015-09-10 MED ORDER — LEVOTHYROXINE SODIUM 75 MCG PO TABS: 75.0000 ug | ORAL_TABLET | Freq: Every day | ORAL | Status: DC

## 2015-09-10 MED ORDER — OLANZAPINE 10 MG PO TABS
5.0000 mg | ORAL_TABLET | Freq: Every day | ORAL | Status: DC
  Administered 2015-09-10: 5 mg via ORAL
  Filled 2015-09-10: qty 1

## 2015-09-10 MED ORDER — DIVALPROEX SODIUM 125 MG PO CSDR
250.0000 mg | DELAYED_RELEASE_CAPSULE | Freq: Two times a day (BID) | ORAL | Status: DC
  Administered 2015-09-10: 250 mg via ORAL
  Filled 2015-09-10: qty 2

## 2015-09-10 MED ORDER — LEVOTHYROXINE SODIUM 75 MCG PO TABS
75.0000 ug | ORAL_TABLET | Freq: Every day | ORAL | Status: DC
  Administered 2015-09-11 – 2015-09-13 (×3): 75 ug via ORAL
  Filled 2015-09-10 (×3): qty 1

## 2015-09-10 MED ORDER — BENZTROPINE MESYLATE 1 MG PO TABS
0.5000 mg | ORAL_TABLET | Freq: Two times a day (BID) | ORAL | Status: DC
  Administered 2015-09-11 – 2015-09-12 (×3): 0.5 mg via ORAL
  Filled 2015-09-10 (×3): qty 1

## 2015-09-10 MED ORDER — PANTOPRAZOLE SODIUM 40 MG PO TBEC
40.0000 mg | DELAYED_RELEASE_TABLET | Freq: Every day | ORAL | Status: DC
  Administered 2015-09-11 – 2015-09-13 (×3): 40 mg via ORAL
  Filled 2015-09-10 (×4): qty 1

## 2015-09-10 MED ORDER — HALOPERIDOL 5 MG PO TABS
5.0000 mg | ORAL_TABLET | Freq: Two times a day (BID) | ORAL | Status: DC
  Administered 2015-09-11: 5 mg via ORAL
  Filled 2015-09-10: qty 1

## 2015-09-10 MED ORDER — PANTOPRAZOLE SODIUM 40 MG PO TBEC
DELAYED_RELEASE_TABLET | ORAL | Status: AC
  Administered 2015-09-10: 40 mg via ORAL
  Filled 2015-09-10: qty 1

## 2015-09-10 MED ORDER — OLANZAPINE 5 MG PO TABS
5.0000 mg | ORAL_TABLET | Freq: Every day | ORAL | Status: DC
Start: 2015-09-10 — End: 2015-09-10

## 2015-09-10 MED ORDER — HALOPERIDOL 5 MG PO TABS
ORAL_TABLET | ORAL | Status: AC
  Administered 2015-09-10: 5 mg via ORAL
  Filled 2015-09-10: qty 1

## 2015-09-10 MED ORDER — MAGNESIUM HYDROXIDE 400 MG/5ML PO SUSP: 30.0000 mL | Freq: Every day | ORAL | Status: DC | PRN

## 2015-09-10 MED ORDER — PANTOPRAZOLE SODIUM 40 MG PO TBEC
40.0000 mg | DELAYED_RELEASE_TABLET | Freq: Every day | ORAL | Status: DC
  Administered 2015-09-10: 40 mg via ORAL

## 2015-09-10 MED ORDER — ALUM & MAG HYDROXIDE-SIMETH 200-200-20 MG/5ML PO SUSP: 30.0000 mL | ORAL | Status: DC | PRN

## 2015-09-10 MED ORDER — BENZTROPINE MESYLATE 0.5 MG PO TABS
ORAL_TABLET | ORAL | Status: AC
Start: 2015-09-10 — End: 2015-09-10
  Administered 2015-09-10: 0.5 mg via ORAL
  Filled 2015-09-10: qty 1

## 2015-09-10 MED ORDER — BENZTROPINE MESYLATE 1 MG PO TABS
0.5000 mg | ORAL_TABLET | Freq: Two times a day (BID) | ORAL | Status: DC
  Administered 2015-09-10: 0.5 mg via ORAL

## 2015-09-10 MED ORDER — ACETAMINOPHEN 325 MG PO TABS: 650.0000 mg | ORAL_TABLET | Freq: Four times a day (QID) | ORAL | Status: DC | PRN

## 2015-09-10 MED ORDER — DIVALPROEX SODIUM 125 MG PO CSDR
250.0000 mg | DELAYED_RELEASE_CAPSULE | Freq: Two times a day (BID) | ORAL | Status: DC
  Administered 2015-09-11: 250 mg via ORAL
  Filled 2015-09-10 (×2): qty 2

## 2015-09-10 NOTE — ED Notes (Addendum)
Patient has been calm and cooperative. Her behavior has been childish at times but this appears to be baseline.  She is currently not expressing any delusional thinking except when directly asked when she offers various stories about "her baby" and other members of the family. She is aware of she is in the hospital and needs to take medication for her "nerves."  Continue all safety precautions.

## 2015-09-10 NOTE — ED Notes (Signed)
Patient resting quietly in room. No noted distress or abnormal behaviors noted. Will continue 15 minute checks and observation by security camera for safety. 

## 2015-09-10 NOTE — ED Notes (Signed)
Patient noted in room. No complaints, stable, in no acute distress. Q15 minute rounds and monitoring via Security Cameras to continue.  

## 2015-09-10 NOTE — ED Notes (Signed)
Patient told that she will be admitted as an inpatient. She was cooperative with taking medications and vital signs. Maintained on 15 minute checks and observation by security camera for safety.

## 2015-09-10 NOTE — ED Notes (Signed)
Patient remains calm and cooperative. She is still expressing some delusional thoughts regarding "her baby" but is easily redirected.  Maintained on 15 minute checks and observation by security camera for safety.

## 2015-09-10 NOTE — BH Assessment (Signed)
Assessment Note  Tamara [name] is an 45 y.o. female. Tamara [name] arrived to the the ED She states that her mom is mad at her because she "got with her man back in the day and had a baby with him and she brought him home in the incubator today and her mother would not hold the baby," She shared the baby's father is dead. She states a friend is taking care of the baby now and will bring the baby around sometimes. She states that she has a rich father and he is having a ball for her and she has to go get dressed.  Patient was not coherent at the time of the interview.  Tamara [name]'s mother reports that she had a nervous break down again.  She has had them since age 64.  Mother states that it is hereditary. Mother states "She was thinking she had a child, her and her mother were lovers, that mother stole her money, that [place] on the bill was making her pregnant.  She is not recognizing pictures of family members or family members in the room. She was argumentative with her mother. Mother states that she was fine in church this morning. Mother states that she has been this way for the past 3 days. Mother states that last month she had an episode that lasted 2 weeks, but they did not bring her due to her being brought in by the police the last time, and she had a hard time afterwards due to being handcuffed. Mother shared that when symptoms were present this time she brought her to the hospital. Mother reports multiple hospitalizations. She reports that she is having auditory and visual hallucination.  She is reported as reacting and responding to internal stimuli. Mother states that she has developmental delay. She is unaware of her IQ.  Mother Tamara [name]) [phone] is her legal guardian.    Diagnosis: Schizophrenia  Past Medical History:  Past Medical History  Diagnosis Date  . Thyroid disease   . Schizophrenia ([place])     History reviewed. No pertinent past surgical  history.  Family History: History reviewed. No pertinent family history.  Social History:  reports that she has never smoked. She does not have any smokeless tobacco history on file. She reports that she does not drink alcohol or use illicit drugs.  Additional Social History:     CIWA: CIWA-Ar BP: (!) 167/91 mmHg Pulse Rate: (!) 144 COWS:    Allergies: No Known Allergies  Home Medications:  (Not in a hospital admission)  OB/GYN Status:  No LMP recorded.  General Assessment Data Location of Assessment: [facility] ED TTS Assessment: In system Is this a Tele or Face-to-Face Assessment?: Face-to-Face Is this an Initial Assessment or a Re-assessment for this encounter?: Initial Assessment Marital status: Single Maiden name: n/a Is patient pregnant?: No Pregnancy Status: No Living Arrangements: Parent Can pt return to current living arrangement?: Yes Admission Status: Voluntary Is patient capable of signing voluntary admission?: No Referral Source: Self/Family/Friend Insurance type: Medicaid  Medical Screening Exam ([place]) Medical Exam completed: Yes  Crisis Care Plan Living Arrangements: Parent Legal Guardian: Mother Tamara [name] [phone]) Name of Psychiatrist: Dr. Lilian Coma Name of Therapist: None at this time  Education Status Is patient currently in school?: No Current Grade: n/a Highest grade of school patient has completed: 12th Name of school: [employer] person: n/a  Risk to self with the past 6 months Suicidal Ideation: No Has patient been a  risk to self within the past 6 months prior to admission? : No Suicidal Intent: No Has patient had any suicidal intent within the past 6 months prior to admission? : No Is patient at risk for suicide?: No Suicidal Plan?: No Has patient had any suicidal plan within the past 6 months prior to admission? : No Access to Means: No What has been your use of drugs/alcohol within the last 12  months?: none  Previous Attempts/Gestures: No How many times?: 0 Other Self Harm Risks: denied Triggers for Past Attempts: None known Intentional Self Injurious Behavior: None Family Suicide History: No Recent stressful life event(s):  (None reported) Persecutory voices/beliefs?: No Depression: No Depression Symptoms:  (denied) Substance abuse history and/or treatment for substance abuse?: No Suicide prevention information given to non-admitted patients: Not applicable  Risk to Others within the past 6 months Homicidal Ideation: No Does patient have any lifetime risk of violence toward others beyond the six months prior to admission? : No Thoughts of Harm to Others: No Current Homicidal Intent: No Current Homicidal Plan: No Access to Homicidal Means: No Identified Victim: None identified History of harm to others?: No Assessment of Violence: None Noted Violent Behavior Description: none reported Does patient have access to weapons?: No Criminal Charges Pending?: No Does patient have a court date: No Is patient on probation?: No  Psychosis Hallucinations: Auditory, Visual Delusions:  (None)  Mental Status Report Appearance/Hygiene: In scrubs Motor Activity: Unremarkable Speech: Slow Level of Consciousness: Alert Mood: Pleasant Affect: Flat Anxiety Level: Minimal Thought Processes: Tangential Judgement: Impaired Orientation: Not oriented Obsessive Compulsive Thoughts/Behaviors: None  Cognitive Functioning Concentration: Poor Memory: Unable to Assess IQ: Below Average Insight: Poor Impulse Control: Poor Appetite: Poor Sleep: Decreased Vegetative Symptoms: None  ADLScreening [facility] Assessment Services) [name] cognitive ability adequate to safely complete daily activities?: Yes Patient able to express need for assistance with ADLs?: Yes Independently performs ADLs?: Yes (appropriate for developmental age)  Prior Inpatient Therapy Prior Inpatient Therapy:  Yes Prior Therapy Dates: Several times since age 15 last time was in  August 2016 Prior Therapy Facilty/Provider(s): [place], [facility] Reason for Treatment: Schizophrenia  Prior Outpatient Therapy Prior Outpatient Therapy: Yes Prior Therapy Dates: Current Prior Therapy Facilty/Provider(s): Dr. Lilian Coma Reason for Treatment: Schizophrenia Does patient have an ACCT team?: No Does patient have Intensive In-House Services?  : No Does patient have [facility] services? : No Does patient have P4CC services?: No  ADL Screening (condition at time of admission) [name] cognitive ability adequate to safely complete daily activities?: Yes Patient able to express need for assistance with ADLs?: Yes Independently performs ADLs?: Yes (appropriate for developmental age)                  Additional Information 1:1 In Past 12 Months?: No CIRT Risk: No Elopement Risk: No Does patient have medical clearance?: Yes     Disposition:  Disposition Initial Assessment Completed for this Encounter: Yes Disposition of Patient: Other dispositions  On Site Evaluation by:   Reviewed with Physician:    Elmer Bales 09/10/2015 12:35 AM

## 2015-09-10 NOTE — ED Notes (Signed)
BEHAVIORAL HEALTH ROUNDING  Patient sleeping: No.  Patient alert and oriented: yes  Behavior appropriate: Yes. ; If no, describe:  Nutrition and fluids offered: Yes  Toileting and hygiene offered: Yes  Sitter present: not applicable, Q 15 min safety rounds and observation.  Law enforcement present: Yes ODS  

## 2015-09-10 NOTE — ED Notes (Signed)
Report received from Ann, RN.

## 2015-09-10 NOTE — ED Notes (Signed)
Patient watching television in the dayroom. In no apparent distress. Maintained on 15 minute checks and observation by security camera for safety.

## 2015-09-10 NOTE — ED Provider Notes (Signed)
-----------------------------------------   5:35 PM on 09/10/2015 -----------------------------------------  Patient has been seen and evaluated by psychiatry, they plan to admit to their service once a bed is available.  Harvest Dark, MD 09/10/15 [phone]

## 2015-09-10 NOTE — ED Notes (Addendum)

## 2015-09-10 NOTE — Progress Notes (Signed)
Pt. To BHU from ED ambulatory without difficulty, to room .Pt. Is alert and oriented, warm and dry in no distress. Pt. denies SI, HI, and AVH. Pt. Remains calm and cooperative. Pt. made aware of security cameras and Q15 minute rounds. Pt. encouraged to let Nursing staff know of any concerns or needs.

## 2015-09-10 NOTE — ED Notes (Signed)
Patient asleep in room. No noted distress or abnormal behavior. Will continue 15 minute checks and observation by security cameras for safety. 

## 2015-09-10 NOTE — ED Notes (Signed)
Report called to Caren Griffins, RN in the ED BHU.

## 2015-09-10 NOTE — ED Notes (Signed)
Report received from [facility] RN. Patient alert and oriented, warm and dry, in no acute distress. Patient denies SI, HI, AVH and pain. Patient made aware of Q15 minute rounds and security cameras for their safety. Patient instructed to come to me with needs or concerns.

## 2015-09-10 NOTE — Consult Note (Signed)
[facility] Face-to-Face Psychiatry Consult   Reason for Consult:  Consult for 45 year old woman with a history of schizophrenia and developmental disability who was dropped off here by her mother because of psychosis. Referring Physician:  Edd Fabian Patient Identification: Tamara [name] MRN:  [id number] Principal Diagnosis: Schizophrenia [facility]) Diagnosis:   Patient Active Problem List   Diagnosis Date Noted  . Schizophrenia ([place]) [F20.9] 09/10/2015  . Thyroid disease [E07.9] 09/10/2015    Total Time spent with patient: 1 hour  Subjective:   Tamara [name] is a 45 y.o. female patient admitted with "I just had a baby and my mother won't pick her up".  HPI:  Patient interviewed. Chart reviewed including old psychiatric hospital notes. Labs reviewed. 45 year old woman who has a history of mental retardation and schizophrenia. Dropped off by her mother with reports that her hallucinations delusions and strange behavior have been worse. The patient is not a very reliable historian but does say that she has a baby that was born sometime last year and she wants to go pick it up but her mother will not help her. Prior history it seems clear that there was no baby born last year. 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 The patient doesn't have any insight into this. Denies being down or depressed. Says that she sleeps okay. Denies suicidal or homicidal ideation. She says that she takes her medicine for her nerves but she can't remember what they are or give me any more detail about it. She is pretty cognitively limited. Ref medical history: History of hypothyroidism.  Substance abuse history: Denies any alcohol or drug abuse no past history of substance abuse problems.  Social history: Apparently lives with her mother. I don't know if there is anyone else in the home regularly right now. Patient is clearly disabled.  Past Psychiatric History: Past history of recurrent episodes of psychosis similar  to this. Mother has reported that the patient will decompensate about once a year and sometimes needs her medicines adjusted. There've been plenty of times in the past when she was lucid and not focused on her delusions. This seems to be a worsening right now. No known history of suicide attempts or violence.  Risk to Self: Suicidal Ideation: No Suicidal Intent: No Is patient at risk for suicide?: No Suicidal Plan?: No Access to Means: No What has been your use of drugs/alcohol within the last 12 months?: none  How many times?: 0 Other Self Harm Risks: denied Triggers for Past Attempts: None known Intentional Self Injurious Behavior: None Risk to Others: Homicidal Ideation: No Thoughts of Harm to Others: No Current Homicidal Intent: No Current Homicidal Plan: No Access to Homicidal Means: No Identified Victim: None identified History of harm to others?: No Assessment of Violence: None Noted Violent Behavior Description: none reported Does patient have access to weapons?: No Criminal Charges Pending?: No Does patient have a court date: No Prior Inpatient Therapy: Prior Inpatient Therapy: Yes Prior Therapy Dates: Several times since age 19 last time was in  August 2016 Prior Therapy Facilty/Provider(s): [facility], Gas[facility] Col[facility] Reason for Treatment: Schizophrenia Prior Outpatient Therapy: Prior Outpatient Therapy: Yes Prior Therapy Dates: Current Prior Therapy Facilty/Provider(s): Dr. Lilian Coma Reason for Treatment: Schizophrenia Does patient have an ACCT team?: No Does patient have Intensive In-House Services?  : No Does patient have [facility] services? : No Does patient have P4CC services?: No  Past Medical History:  Past Medical History  Diagnosis Date  . Thyroid disease   .  Schizophrenia ([place])    History reviewed. No pertinent past surgical history. Family History: History reviewed. No pertinent family history. Family Psychiatric  History: Patient is not able to give any  family history at all. Social History:  History  Alcohol Use No     History  Drug Use No    Social History   Social History  . Marital Status: Single    Spouse Name: N/A  . Number of Children: N/A  . Years of Education: N/A   Social History Main Topics  . Smoking status: Never Smoker   . Smokeless tobacco: None  . Alcohol Use: No  . Drug Use: No  . Sexual Activity: Yes    Birth Control/ Protection: Surgical   Other Topics Concern  . None   Social History Narrative  . None   Additional Social History:    Allergies:  No Known Allergies  Labs:  Results for orders placed or performed during the hospital encounter of 09/09/15 (from the past 48 hour(s))  Comprehensive metabolic panel     Status: Abnormal   Collection Time: 09/09/15  9:07 PM  Result Value Ref Range   Sodium 130 (L) 135 - 145 mmol/L   Potassium 3.3 (L) 3.5 - 5.1 mmol/L   Chloride 96 (L) 101 - 111 mmol/L   CO2 23 22 - 32 mmol/L   Glucose, Bld 135 (H) 65 - 99 mg/dL   BUN <5 (L) 6 - 20 mg/dL   Creatinine, Ser 0.84 0.44 - 1.00 mg/dL   Calcium 9.7 8.9 - 10.3 mg/dL   Total Protein 8.0 6.5 - 8.1 g/dL   Albumin 4.5 3.5 - 5.0 g/dL   AST 34 15 - 41 U/L   ALT 27 14 - 54 U/L   Alkaline Phosphatase 95 38 - 126 U/L   Total Bilirubin 0.7 0.3 - 1.2 mg/dL   GFR calc non Af Amer >60 >60 mL/min   GFR calc Af Amer >60 >60 mL/min    Comment: (NOTE) The eGFR has been calculated using the CKD EPI equation. This calculation has not been validated in all clinical situations. eGFR's persistently <60 mL/min signify possible Chronic Kidney Disease.    Anion gap 11 5 - 15  Ethanol     Status: None   Collection Time: 09/09/15  9:07 PM  Result Value Ref Range   Alcohol, Ethyl (B) <5 <5 mg/dL    Comment:        LOWEST DETECTABLE LIMIT FOR SERUM ALCOHOL IS 5 mg/dL FOR MEDICAL PURPOSES ONLY   CBC with Diff     Status: Abnormal   Collection Time: 09/09/15  9:07 PM  Result Value Ref Range   WBC 10.4 3.6 - 11.0 K/uL    RBC 4.73 3.80 - 5.20 MIL/uL   Hemoglobin 13.4 12.0 - 16.0 g/dL   HCT 39.0 35.0 - 47.0 %   MCV 82.6 80.0 - 100.0 fL   MCH 28.4 26.0 - 34.0 pg   MCHC 34.4 32.0 - 36.0 g/dL   RDW 13.8 11.5 - 14.5 %   Platelets 282 150 - 440 K/uL   Neutrophils Relative % 57 %   Neutro Abs 5.9 1.4 - 6.5 K/uL   Lymphocytes Relative 29 %   Lymphs Abs 3.0 1.0 - 3.6 K/uL   Monocytes Relative 13 %   Monocytes Absolute 1.3 (H) 0.2 - 0.9 K/uL   Eosinophils Relative 1 %   Eosinophils Absolute 0.1 0 - 0.7 K/uL   Basophils Relative 0 %  Basophils Absolute 0.0 0 - 0.1 K/uL  Salicylate level     Status: None   Collection Time: 09/09/15  9:07 PM  Result Value Ref Range   Salicylate Lvl <6.0 2.8 - 30.0 mg/dL  Acetaminophen level     Status: Abnormal   Collection Time: 09/09/15  9:07 PM  Result Value Ref Range   Acetaminophen (Tylenol), Serum <10 (L) 10 - 30 ug/mL    Comment:        THERAPEUTIC CONCENTRATIONS VARY SIGNIFICANTLY. A RANGE OF 10-30 ug/mL MAY BE AN EFFECTIVE CONCENTRATION FOR MANY PATIENTS. HOWEVER, SOME ARE BEST TREATED AT CONCENTRATIONS OUTSIDE THIS RANGE. ACETAMINOPHEN CONCENTRATIONS >150 ug/mL AT 4 HOURS AFTER INGESTION AND >50 ug/mL AT 12 HOURS AFTER INGESTION ARE OFTEN ASSOCIATED WITH TOXIC REACTIONS.   Urine Drug Screen, Qualitative ([facility] only)     Status: None   Collection Time: 09/09/15  9:07 PM  Result Value Ref Range   Tricyclic, Ur Screen NONE DETECTED NONE DETECTED   Amphetamines, Ur Screen NONE DETECTED NONE DETECTED   MDMA (Ecstasy)Ur Screen NONE DETECTED NONE DETECTED   Cocaine Metabolite,Ur [place] NONE DETECTED NONE DETECTED   Opiate, Ur Screen NONE DETECTED NONE DETECTED   Phencyclidine (PCP) Ur S NONE DETECTED NONE DETECTED   Cannabinoid 50 Ng, Ur [place] NONE DETECTED NONE DETECTED   Barbiturates, Ur Screen NONE DETECTED NONE DETECTED   Benzodiazepine, Ur Scrn NONE DETECTED NONE DETECTED   Methadone Scn, Ur NONE DETECTED NONE DETECTED    Comment: (NOTE) 737  Tricyclics,  urine               Cutoff 1000 ng/mL 200  Amphetamines, urine             Cutoff 1000 ng/mL 300  MDMA (Ecstasy), urine           Cutoff 500 ng/mL 400  Cocaine Metabolite, urine       Cutoff 300 ng/mL 500  Opiate, urine                   Cutoff 300 ng/mL 600  Phencyclidine (PCP), urine      Cutoff 25 ng/mL 700  Cannabinoid, urine              Cutoff 50 ng/mL 800  Barbiturates, urine             Cutoff 200 ng/mL 900  Benzodiazepine, urine           Cutoff 200 ng/mL 1000 Methadone, urine                Cutoff 300 ng/mL 1100 1200 The urine drug screen provides only a preliminary, unconfirmed 1300 analytical test result and should not be used for non-medical 1400 purposes. Clinical consideration and professional judgment should 1500 be applied to any positive drug screen result due to possible 1600 interfering substances. A more specific alternate chemical method 1700 must be used in order to obtain a confirmed analytical result.  1800 Gas chromato graphy / mass spectrometry (GC/MS) is the preferred 1900 confirmatory method.   Urinalysis complete, with microscopic ([facility] only)     Status: Abnormal   Collection Time: 09/09/15  9:07 PM  Result Value Ref Range   Color, Urine STRAW (A) YELLOW   APPearance CLEAR (A) CLEAR   Glucose, UA NEGATIVE NEGATIVE mg/dL   Bilirubin Urine NEGATIVE NEGATIVE   Ketones, ur NEGATIVE NEGATIVE mg/dL   Specific Gravity, Urine 1.001 (L) 1.005 - 1.030   Hgb urine dipstick 2+ (A)  NEGATIVE   pH 7.0 5.0 - 8.0   Protein, ur NEGATIVE NEGATIVE mg/dL   Nitrite NEGATIVE NEGATIVE   Leukocytes, UA 1+ (A) NEGATIVE   RBC / HPF 0-5 0 - 5 RBC/hpf   WBC, UA 6-30 0 - 5 WBC/hpf   Bacteria, UA NONE SEEN NONE SEEN   Squamous Epithelial / LPF 0-5 (A) NONE SEEN  Pregnancy, urine POC     Status: None   Collection Time: 09/09/15  9:21 PM  Result Value Ref Range   Preg Test, Ur NEGATIVE NEGATIVE    Comment:        THE SENSITIVITY OF THIS METHODOLOGY IS >24 mIU/mL     No  current facility-administered medications for this encounter.   Current Outpatient Prescriptions  Medication Sig Dispense Refill  . benztropine (COGENTIN) 0.5 MG tablet Take 0.5 mg by mouth 2 (two) times daily.    . cetirizine (ZYRTEC) 10 MG tablet Take 10 mg by mouth daily.    . haloperidol (HALDOL) 0.5 MG tablet Take 0.5 mg by mouth 2 (two) times daily.    . haloperidol decanoate (HALDOL DECANOATE) 100 MG/ML injection Inject 100 mg into the muscle every 28 (twenty-eight) days.    . hydrOXYzine (ATARAX/VISTARIL) 50 MG tablet Take 50 mg by mouth every 6 (six) hours as needed.    Marland Kitchen levothyroxine (SYNTHROID, LEVOTHROID) 75 MCG tablet Take 75 mcg by mouth daily before breakfast.    . olanzapine zydis (ZYPREXA) 15 MG disintegrating tablet Take 15 mg by mouth at bedtime.    . potassium chloride SA (K-DUR,KLOR-CON) 20 MEQ tablet Take 20 mEq by mouth 2 (two) times daily.    . ranitidine (ZANTAC) 15 MG/ML syrup Take by mouth 2 (two) times daily. 2 teaspoon fulls    . Valproate Sodium (VALPROIC ACID) 250 MG/5ML SOLN Take 45 mLs by mouth at bedtime.      Musculoskeletal: Strength & Muscle Tone: within normal limits Gait & Station: normal Patient leans: N/A  Psychiatric Specialty Exam: Review of Systems  Constitutional: Negative.   HENT: Negative.   Eyes: Negative.   Respiratory: Negative.   Cardiovascular: Negative.   Gastrointestinal: Negative.   Musculoskeletal: Negative.   Skin: Negative.   Neurological: Negative.   Psychiatric/Behavioral: Negative for depression, suicidal ideas, hallucinations, memory loss and substance abuse. The patient is nervous/anxious. The patient does not have insomnia.     Blood pressure 106/57, pulse 102, temperature 98.6 F (37 C), temperature source Oral, resp. rate 20, height 5' 2"  (1.575 m), weight 91.627 kg (202 lb), SpO2 100 %.Body mass index is 36.94 kg/(m^2).  General Appearance: Disheveled  Eye Contact::  Good  Speech:  Clear and Coherent  Volume:   Normal  Mood:  Euthymic  Affect:  Inappropriate  Thought Process:  Goal Directed  Orientation:  Full (Time, Place, and Person)  Thought Content:  Delusions  Suicidal Thoughts:  No  Homicidal Thoughts:  No  Memory:  Immediate;   Fair Recent;   Poor Remote;   Poor  Judgement:  Impaired  Insight:  Shallow  Psychomotor Activity:  Normal  Concentration:  Poor  Recall:  Poor  Fund of Knowledge:Poor  Language: Poor  Akathisia:  No  Handed:  Right  AIMS (if indicated):     Assets:  Communication Skills Desire for Improvement Financial Resources/Insurance Housing Intimacy Social Support  ADL's:  Intact  Cognition: Impaired,  Mild  Sleep:      Treatment Plan Summary: Daily contact with patient to assess and evaluate symptoms and  progress in treatment, Medication management and Plan Patient with decompensated psychosis that apparently is causing more problems at home. It's unclear to me what medicine she is currently taking. She is to be on a Haldol decanoate shot I don't know if that is still being administered. I haven't been able to get in touch with the family. Patient will be admitted to the hospital because of psychosis. Continuous observation for now. Check labs including the TSH lipid panel and hemoglobin A1c and prolactin level. Continue some version of current antipsychotic medicines while she is stabilized.  Disposition: Recommend psychiatric Inpatient admission when medically cleared.  Alethia Berthold, MD 09/10/2015 3:25 PM

## 2015-09-11 ENCOUNTER — Encounter: Payer: Self-pay | Admitting: Psychiatry

## 2015-09-11 DIAGNOSIS — L219 Seborrheic dermatitis, unspecified: Secondary | ICD-10-CM

## 2015-09-11 DIAGNOSIS — F209 Schizophrenia, unspecified: Principal | ICD-10-CM

## 2015-09-11 DIAGNOSIS — F70 Mild intellectual disabilities: Secondary | ICD-10-CM

## 2015-09-11 DIAGNOSIS — K219 Gastro-esophageal reflux disease without esophagitis: Secondary | ICD-10-CM

## 2015-09-11 LAB — TSH: TSH: 3.373 u[IU]/mL (ref 0.350–4.500)

## 2015-09-11 LAB — BASIC METABOLIC PANEL
ANION GAP: 9 (ref 5–15)
BUN: 8 mg/dL (ref 6–20)
CALCIUM: 10 mg/dL (ref 8.9–10.3)
CO2: 28 mmol/L (ref 22–32)
CREATININE: 0.81 mg/dL (ref 0.44–1.00)
Chloride: 101 mmol/L (ref 101–111)
GFR calc Af Amer: >60 mL/min (ref >60)
GLUCOSE: 99 mg/dL (ref 65–99)
Potassium: 3.9 mmol/L (ref 3.5–5.1)
Sodium: 138 mmol/L (ref 135–145)

## 2015-09-11 LAB — LIPID PANEL
CHOL/HDL RATIO: 2.9 ratio
Cholesterol: 240 mg/dL — ABNORMAL HIGH (ref 0–200)
HDL: 84 mg/dL (ref >40)
LDL CALC: 127 mg/dL — AB (ref 0–99)
Triglycerides: 145 mg/dL (ref <150)
VLDL: 29 mg/dL (ref 0–40)

## 2015-09-11 LAB — VALPROIC ACID LEVEL: VALPROIC ACID LVL: 41 ug/mL — AB (ref 50.0–100.0)

## 2015-09-11 LAB — HEMOGLOBIN A1C: HEMOGLOBIN A1C: 5.5 % (ref 4.0–6.0)

## 2015-09-11 MED ORDER — OLANZAPINE 5 MG PO TBDP
15.0000 mg | ORAL_TABLET | Freq: Every day | ORAL | Status: DC
  Administered 2015-09-11 – 2015-09-12 (×2): 15 mg via ORAL
  Filled 2015-09-11 (×2): qty 3

## 2015-09-11 MED ORDER — POTASSIUM CHLORIDE CRYS ER 20 MEQ PO TBCR
20.0000 meq | EXTENDED_RELEASE_TABLET | Freq: Two times a day (BID) | ORAL | Status: DC
  Administered 2015-09-11 – 2015-09-12 (×3): 20 meq via ORAL
  Filled 2015-09-11 (×3): qty 1

## 2015-09-11 MED ORDER — HALOPERIDOL 0.5 MG PO TABS
0.5000 mg | ORAL_TABLET | Freq: Two times a day (BID) | ORAL | Status: DC
  Administered 2015-09-11 – 2015-09-12 (×3): 0.5 mg via ORAL
  Filled 2015-09-11 (×3): qty 1

## 2015-09-11 MED ORDER — VALPROIC ACID 250 MG PO CAPS
2250.0000 mg | ORAL_CAPSULE | Freq: Every day | ORAL | Status: DC
  Administered 2015-09-11 – 2015-09-12 (×2): 2250 mg via ORAL
  Filled 2015-09-11 (×4): qty 9

## 2015-09-11 NOTE — [facility] Group Notes (Signed)
[facility] LCSW Group Therapy  09/11/2015 4:41 PM  Type of Therapy:  Group Therapy  Participation Level:  Did Not Attend    August Saucer, MSW, LCSW 09/11/2015, 4:41 PM

## 2015-09-11 NOTE — [facility] Group Notes (Signed)
[place] Group Notes:  (Nursing/MHT/Case Management/Adjunct)  Date:  09/11/2015  Time:  2:19 PM  Type of Therapy:  Psychoeducational Skills  Participation Level:  Active  Participation Quality:  Appropriate, Attentive and Sharing  Affect:  Appropriate  Cognitive:  Alert and Appropriate  Insight:  Appropriate  Engagement in Group:  Engaged  Modes of Intervention:  Discussion, Education and Support  Summary of Progress/Problems:  Tamara [name] [facility] 09/11/2015, 2:19 PM

## 2015-09-11 NOTE — Progress Notes (Signed)
Recreation Therapy Notes  Date: 04.18.17 Time: 9:30 am Location: Craft Room  Group Topic: Goal Setting  Goal Area(s) Addresses:  Patient will write one goal. Patient will write one positive statement.  Behavioral Response: Did not attend  Intervention: Step By Step  Activity: Patients were given a blank foot worksheet and instructed to write a goal on the inside of the foot and to write positive words on the outside of the foot.  Education: LRT educated patients on why it is important to set goals.  Education Outcome: Patient did not attend group.  Clinical Observations/Feedback: Patient did not attend group.  Leonette Monarch, LRT/CTRS 09/11/2015 10:09 AM

## 2015-09-11 NOTE — H&P (Signed)
Psychiatric Admission Assessment Adult  Patient Identification: [name] MRN:  [id number] Date of Evaluation:  09/11/2015 Chief Complaint:  Schizophrenia Principal Diagnosis: Schizophrenia ([place]) Diagnosis:   Patient Active Problem List   Diagnosis Date Noted  . GERD (gastroesophageal reflux disease) [K21.9] 09/11/2015  . Seborrheic dermatitis [L21.9] 09/11/2015  . 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 Schizophrenia ([place]) [F20.9] 09/10/2015  . 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 female brought to the on 4/16 ED by her [name] with a chief complaint of agitation and requesting medication adjustment. Patient has a history of schizophrenia who has been experiencing labile mood swings and agitation as well as paranoia. [name] reports patient becomes unbalanced approximately once per year necessitating medication adjustments.   Collateral info obtained in the ER: [name] reports that she had a "nervous breakdown again". [name] states "She was thinking she had a child, her and her [name] were lovers, that [name] stole her money, that [place] on the bill was making her pregnant.  She is not recognizing pictures of family members or family members in the room. [name] states that she has been this way for the past 3 days. [name] reports multiple hospitalizations. She reports that she is having auditory and visual hallucination.  She is reported as reacting and responding to internal stimuli. [name] states that she has developmental delay. She is unaware of her IQ.  [name] [name]) [phone] is her legal guardian.   Hospitalized in our facility about 5 times. Her last hospitalization was in 2015. Her diagnosis has ranged from schizophrenia to schizoaffective disorder bipolar type. She has been treated with Haldol decanoate and also  Prolixin Decanoate in the past. She has a history of mild intellectual disability and polydipsia. She has presented to the hospital with episodes of hyponatremia. Patient also has history of noncompliance with medications.  Patient currently follows with [facility] Dr. Leticia Penna. In addition to her history of mental illness she has been diagnosed with GERD and hypothyroidism.  I spoke with [name] this morning. She reports the patient has being erratic and disorganized for the last 3 days. The [name] states that she has been drinking large amounts of fluids. Her sensorium upon admission was 130 to rest of the electrolytes were within the normal limits.  [name] denies that the patient has been using any alcohol or illicit substances. Patient is not a smoker. [name] states she gets the medicine to the patient on a daily basis she does not think patient has been missing doses.  Associated Signs/Symptoms: Depression Symptoms:  denies (Hypo) Manic Symptoms:  denies Anxiety Symptoms:  denies Psychotic Symptoms:  denie PTSD Symptoms: NA Total Time spent with patient: 1 hour  Past Psychiatric History: Multiple prior hospitalizations. No prior history of suicidal attempts. No prior history of self-injurious behaviors. Currently follows up with [facility]  Is the patient at risk to self? Yes.   Has the patient been a risk to self in the past 6 months? yES.  Has the patient been a risk to self within the distant past? Yes.   Is the patient a risk to others? No.  Has the patient been a risk to others in the past 6 months? No.   Has the patient been a risk to others within the distant past? NO.  I Past Medical History:  Past Medical History  Diagnosis  Date  . Thyroid disease   . Schizophrenia ([place])   . IUD (intrauterine device) in place IUD was placed in 2016  . Leiomyoma of uterus   . GERD (gastroesophageal reflux disease)    History reviewed. No  pertinent past surgical history.  Family History: History reviewed. No pertinent family history.  Social History: Patient lives with her [name]. Her [name] is her legal guardian. Her [name]'s name is [name] something her telephone number is [phone].  Patient completed the 12th grade. History  Alcohol Use No     History  Drug Use No     Allergies:  No Known Allergies  Lab Results:  Results for orders placed or performed during the hospital encounter of 09/10/15 (from the past 48 hour(s))  Lipid panel     Status: Abnormal   Collection Time: 09/11/15  6:36 AM  Result Value Ref Range   Cholesterol 240 (H) 0 - 200 mg/dL   Triglycerides 145 <150 mg/dL   HDL 84 >40 mg/dL   Total CHOL/HDL Ratio 2.9 RATIO   VLDL 29 0 - 40 mg/dL   LDL Cholesterol 127 (H) 0 - 99 mg/dL    Comment:        Total Cholesterol/HDL:CHD Risk Coronary Heart Disease Risk Table                     Men   Women  1/2 Average Risk   3.4   3.3  Average Risk       5.0   4.4  2 X Average Risk   9.6   7.1  3 X Average Risk  23.4   11.0        Use the calculated Patient Ratio above and the CHD Risk Table to determine the patient's CHD Risk.        ATP III CLASSIFICATION (LDL):  <100     mg/dL   Optimal  100-129  mg/dL   Near or Above                    Optimal  130-159  mg/dL   Borderline  160-189  mg/dL   High  >190     mg/dL   Very High   TSH     Status: None   Collection Time: 09/11/15  6:36 AM  Result Value Ref Range   TSH 3.373 0.350 - 4.500 uIU/mL    Blood Alcohol level:  Lab Results  Component Value Date   ETH <5 [id number]    Metabolic Disorder Labs:  No results found for: HGBA1C, MPG No results found for: PROLACTIN Lab Results  Component Value Date   CHOL 240* 09/11/2015   TRIG 145 09/11/2015   HDL 84 09/11/2015   CHOLHDL 2.9 09/11/2015   VLDL 29 09/11/2015   LDLCALC 127* 09/11/2015    Current Medications: Current Facility-Administered Medications  Medication Dose Route  Frequency Provider Last Rate Last Dose  . acetaminophen (TYLENOL) tablet 650 mg  650 mg Oral Q6H PRN Gonzella Lex, MD      . alum & mag hydroxide-simeth (MAALOX/MYLANTA) 200-200-20 MG/5ML suspension 30 mL  30 mL Oral Q4H PRN Gonzella Lex, MD      . benztropine (COGENTIN) tablet 0.5 mg  0.5 mg Oral BID Gonzella Lex, MD   0.5 mg at 09/11/15 [id number]  . haloperidol (HALDOL) tablet 0.5 mg  0.5 mg Oral BID Hildred Priest, MD      . levothyroxine (SYNTHROID, LEVOTHROID)  tablet 75 mcg  75 mcg Oral QAC breakfast Gonzella Lex, MD   75 mcg at 09/11/15 [phone]  . magnesium hydroxide (MILK OF MAGNESIA) suspension 30 mL  30 mL Oral Daily PRN Gonzella Lex, MD      . OLANZapine zydis (ZYPREXA) disintegrating tablet 15 mg  15 mg Oral QHS Hildred Priest, MD      . pantoprazole (PROTONIX) EC tablet 40 mg  40 mg Oral Daily Gonzella Lex, MD   40 mg at 09/11/15 0634  . potassium chloride SA (K-DUR,KLOR-CON) CR tablet 20 mEq  20 mEq Oral BID Hildred Priest, MD      . Valproic Acid SOLN 2,250 mg  45 mL Oral QHS Hildred Priest, MD       PTA Medications: Prescriptions prior to admission  Medication Sig Dispense Refill Last Dose  . benztropine (COGENTIN) 0.5 MG tablet Take 0.5 mg by mouth 2 (two) times daily.   unknown at unknown  . cetirizine (ZYRTEC) 10 MG tablet Take 10 mg by mouth daily.   unknown at unknown  . haloperidol (HALDOL) 0.5 MG tablet Take 0.5 mg by mouth 2 (two) times daily.   unknown at unknown  . haloperidol decanoate (HALDOL DECANOATE) 100 MG/ML injection Inject 100 mg into the muscle every 28 (twenty-eight) days.   unknown at unknown  . hydrOXYzine (ATARAX/VISTARIL) 50 MG tablet Take 50 mg by mouth every 6 (six) hours as needed.   unknown at unknown  . levothyroxine (SYNTHROID, LEVOTHROID) 75 MCG tablet Take 75 mcg by mouth daily before breakfast.   unknown at unknown  . olanzapine zydis (ZYPREXA) 15 MG disintegrating tablet Take 15 mg by mouth at  bedtime.   unknown at unknown  . potassium chloride SA (K-DUR,KLOR-CON) 20 MEQ tablet Take 20 mEq by mouth 2 (two) times daily.   unknown at unknown  . ranitidine (ZANTAC) 15 MG/ML syrup Take by mouth 2 (two) times daily. 2 teaspoon fulls   unknown at unknown  . Valproate Sodium (VALPROIC ACID) 250 MG/5ML SOLN Take 45 mLs by mouth at bedtime.   unknown at unknown    Musculoskeletal: Strength & Muscle Tone: within normal limits Gait & Station: normal Patient leans: N/A  Psychiatric Specialty Exam: Physical Exam  Constitutional: She is oriented to person, place, and time. She appears well-developed and well-nourished.  HENT:  Head: Normocephalic and atraumatic.  Eyes: Conjunctivae and EOM are normal.  Neck: Normal range of motion.  Respiratory: Effort normal.  Musculoskeletal: Normal range of motion.  Neurological: She is alert and oriented to person, place, and time.    Review of Systems  Constitutional: Negative.   HENT: Negative.   Eyes: Negative.   Respiratory: Negative.   Cardiovascular: Negative.   Gastrointestinal: Negative.   Genitourinary: Negative.   Musculoskeletal: Negative.   Skin: Negative.   Neurological: Negative.   Endo/Heme/Allergies: Negative.   Psychiatric/Behavioral: Negative.     Blood pressure 121/58, pulse 93, temperature 97.8 F (36.6 C), temperature source Oral, resp. rate 18, height 5\' 2"  (1.575 m), weight 87.544 kg (193 lb), SpO2 100 %.Body mass index is 35.29 kg/(m^2).  General Appearance: Fairly Groomed  Engineer, water::  Good  Speech:  Pressured  Volume:  Increased  Mood:  Euthymic  Affect:  Appropriate  Thought Process:  concrete  Orientation:  Full (Time, Place, and Person)  Thought Content:  Hallucinations: None  Suicidal Thoughts:  No  Homicidal Thoughts:  No  Memory:  Immediate;   Fair Recent;   Fair Remote;  Fair  Judgement:  Impaired  Insight:  Shallow  Psychomotor Activity:  Normal  Concentration:  Fair  Recall:  [employer]  of Knowledge:Poor  Language: Fair  Akathisia:  No  Handed:    AIMS (if indicated):     Assets:  Armed forces logistics/support/administrative officer Physical Health  ADL's:  Intact  Cognition: WNL  Sleep:  Number of Hours: 6.25    Treatment Plan Summary:  Schizophrenia versus schizoaffective disorder. I reviewed her current medications patient is receiving Haldol decanoate 100 mg every 28 days, unknown as to when she last received the injection. She is also maintained on olanzapine disintegrating tablet 50 mg at bedtime and Haldol 0.5 mg by mouth twice a day. These medications will be restarted no changes will be made today.  Patient is also on Depakote 2250 MG at bedtime. She is prescribed with liquid Depakote. Unfortunately a Depakote level was not checked in the emergency department. I will order a random level today  EPS patient is currently taking benztropine 0.5 mg by mouth twice a day  Hypothyroidism the patient will be continued on Synthroid 75 g a day. TSH was checked and was within normal limits  Hyponatremia: Currently psychotic and is 130. Patient has history of polydipsia.  Hypokalemia: [place] Capri patient patient was maintained oN K-Dur 20 milliequivalents twice a day  For GERD patient will be continued on Protonix 40 mg a day  Labs: Hemoglobin A1c, and prolactin have been ordered. I will order a random Depakote level.  Precautions every 15 minute checks  Hospitalization and status continue involuntary commitment  Diet regular  Disposition patient will discharge back to her [name]'s care once stable  Follow-up the patient will continue to follow-up with [facility]   [id number] MINUTES  I certify that inpatient services furnished can reasonably be expected to improve the patient's condition.    Hildred Priest, MD 4/18/20179:36 AM

## 2015-09-11 NOTE — Progress Notes (Signed)
Patient pleasant, cooperative with ward routine, had very minimal interaction with peers and required short frequent directions to go to groups or day area. Denies SI, +ve AH/VH and contracts for safety.

## 2015-09-11 NOTE — Tx Team (Signed)
Initial Interdisciplinary Treatment Plan   PATIENT STRESSORS: Marital or family conflict   PATIENT STRENGTHS: General fund of knowledge Supportive family/friends   PROBLEM LIST: Problem List/Patient Goals Date to be addressed Date deferred Reason deferred Estimated date of resolution  Psychosis  09/10/15     Per counselor note: "got with her man back in the day and had a baby with him and she brought him home in the incubator today and her mother would not hold the baby,"  09/10/15                                                DISCHARGE CRITERIA:  Improved stabilization in mood, thinking, and/or behavior  PRELIMINARY DISCHARGE PLAN: Outpatient therapy  PATIENT/FAMIILY INVOLVEMENT: This treatment plan has been presented to and reviewed with the patient, [facility], and/or family member.  The patient and family have been given the opportunity to ask questions and make suggestions.  Derrill Kay 09/11/2015, 12:13 AM
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
## 2015-09-11 NOTE — Progress Notes (Signed)
Patient ID: Tamara [name], female   DOB: Aug 28, 1970, 45 y.o.   MRN: [id number] Patient admitted to the ed IVC after some delusional thoughts about mother stating, "got with her man back in the day and had a baby with him and she brought him home in the incubator today and her mother would not hold the baby" per counselor note. No delusional statements made with this Probation officer. Patient talking about mother stated, "we get mad at eachother then I end up here." She denies SI/HI/AVH at this time. She was oriented to unit. Patient shows some confusion, not being able to find her room on multiple occasion. She's been pleasant and cooperative with admission interview. Safety maintained with 15 min checks.

## 2015-09-11 NOTE — Plan of Care (Signed)
Problem: Alteration in thought process Goal: STG-Patient is able to follow short directions Outcome: Progressing Patient is able to follow short frequent directions.

## 2015-09-11 NOTE — BHH Suicide Risk Assessment (Signed)
[facility] Admission Suicide Risk Assessment   Nursing information obtained from:  Patient Demographic factors:  Unemployed, Low socioeconomic status Current Mental Status:  NA Loss Factors:  NA Historical Factors:  Family history of mental illness or substance abuse (Cousin, niece schizophrenia ) Risk Reduction Factors:  Positive social support  Total Time spent with patient: 1 hour Principal Problem: Schizophrenia ([place]) Diagnosis:   Patient Active Problem List   Diagnosis Date Noted  . GERD (gastroesophageal reflux disease) [K21.9] 09/11/2015  . Seborrheic dermatitis [L21.9] 09/11/2015  . 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 Schizophrenia ([place]) [F20.9] 09/10/2015  . 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  World Pharmacologist [facility]). Score between 0-7:  no or low risk or alcohol related problems. Score between 8-15:  moderate risk of alcohol related problems. Score between 16-19:  high risk of alcohol related problems. Score 20 or above:  warrants further diagnostic evaluation for alcohol dependence and treatment.   CLINICAL FACTORS:   Schizophrenia:   Paranoid or undifferentiated type Previous Psychiatric Diagnoses and Treatments   Psychiatric Specialty Exam: ROS  Blood pressure 121/58, pulse 93, temperature 97.8 F (36.6 C), temperature source Oral, resp. rate 18, height 5\' 2"  (1.575 m), weight 87.544 kg (193 lb), SpO2 100 %.Body mass index is 35.29 kg/(m^2).   COGNITIVE FEATURES THAT CONTRIBUTE TO RISK:  None    SUICIDE RISK:   Mild:  Suicidal ideation of limited frequency, intensity, duration, and specificity.  There are no identifiable plans, no associated intent, mild dysphoria and  related symptoms, good self-control (both objective and subjective assessment), few other risk factors, and identifiable protective factors, including available and accessible social support.  PLAN OF CARE: admit to [facility]  I certify that inpatient services furnished can reasonably be expected to improve the patient's condition.   Hildred Priest, MD 09/11/2015, 10:02 AM

## 2015-09-11 NOTE — Plan of Care (Signed)
Problem: Ineffective individual coping Goal: STG: Patient will remain free from self harm Outcome: Progressing Patient has remained free from harm on unit.

## 2015-09-12 LAB — PROLACTIN: PROLACTIN: 163.4 ng/mL — AB (ref 4.8–23.3)

## 2015-09-12 MED ORDER — POTASSIUM CHLORIDE CRYS ER 20 MEQ PO TBCR: 20.0000 meq | EXTENDED_RELEASE_TABLET | Freq: Every day | ORAL | Status: DC

## 2015-09-12 MED ORDER — AMANTADINE HCL 100 MG PO CAPS
100.0000 mg | ORAL_CAPSULE | Freq: Every day | ORAL | Status: DC
  Administered 2015-09-12: 100 mg via ORAL
  Filled 2015-09-12: qty 1

## 2015-09-12 NOTE — Plan of Care (Signed)
Problem: Ineffective individual coping Goal: STG: Patient will remain free from self harm Outcome: Progressing Calm and cooperative. Bright affect. Med complaint. No voiced thoughts of hurting herself. Pt remains free from harm.

## 2015-09-12 NOTE — BHH Suicide Risk Assessment (Signed)
[place] INPATIENT:  Family/Significant Other Suicide Prevention Education  Suicide Prevention Education:  Education Completed; wit the pt's mother Hayvin Petrey at ph: [phone],  (name of family member/significant other) has been identified by the patient as the family member/significant other with whom the patient will be residing, and identified as the person(s) who will aid the patient in the event of a mental health crisis (suicidal ideations/suicide attempt).  With written consent from the patient, the family member/significant other has been provided the following suicide prevention education, prior to the and/or following the discharge of the patient.  CSW completed SPE with the pt.  The suicide prevention education provided includes the following:  Suicide risk factors  Suicide prevention and interventions  National Suicide Hotline telephone number  [facility] assessment telephone number  [facility] Emergency Assistance [place] and/or Residential Mobile Crisis Unit telephone number  Request made of family/significant other to:  Remove weapons (e.g., guns, rifles, knives), all items previously/currently identified as safety concern.    Remove drugs/medications (over-the-counter, prescriptions, illicit drugs), all items previously/currently identified as a safety concern.  The family member/significant other verbalizes understanding of the suicide prevention education information provided.  The family member/significant other agrees to remove the items of safety concern listed above.  Alphonse Guild Shantrell Placzek 09/12/2015, 3:39 PM

## 2015-09-12 NOTE — BHH Group Notes (Signed)
[place] LCSW Group Therapy  09/12/2015 3:17 PM  Type of Therapy:  Group Therapy  Participation Level:  Active   Participation Quality:  Attentive  Affect:  Excited  Cognitive:  Lacking  Insight:  Limited  Engagement in Therapy:  Limited  Modes of Intervention:  Discussion, Education, Socialization and Support  Summary of Progress/Problems: Self esteem: Patients discussed self esteem and how it impacts them. They discussed what aspects in their lives has influenced their self esteem. They were challenged to identify changes that are needed in order to improve self esteem.  Pt attended group and stayed the entire time. She discussed feeling sad when people call her "slow". She states her medications help her feel less sad.   [place] MSW, [place]  09/12/2015, 3:17 PM

## 2015-09-12 NOTE — Progress Notes (Signed)
Recreation Therapy Notes  Date: 04.19.17 Time: 9:30 am Location: Craft Room  Group Topic: Self-esteem  Goal Area(s) Addresses:  Patient will write at least one positive trait. Patient will verbalize benefit of having healthy self-esteem.  Behavioral Response: Did not attend  Intervention: I Am  Activity: Patients were given a worksheet with the letter I on it and instructed to write as many positive traits about themselves inside the letter.  Education: LRT educated patients on ways they can increase their self-esteem.  Education Outcome: Patient did not attend group.  Clinical Observations/Feedback: Patient did not attend group.  Leonette Monarch, LRT/CTRS 09/12/2015 9:52 AM

## 2015-09-12 NOTE — BHH Group Notes (Signed)
[place] Group Notes:  (Nursing/MHT/Case Management/Adjunct)  Date:  09/12/2015  Time:  5:14 PM  Type of Therapy:  Psychoeducational Skills  Participation Level:  Active  Participation Quality:  Appropriate  Affect:  Appropriate  Cognitive:  Appropriate  Insight:  Appropriate  Engagement in Group:  Engaged and Improving  Modes of Intervention:  Discussion and Education  Summary of Progress/Problems:  Drake Leach 09/12/2015, 5:14 PM

## 2015-09-12 NOTE — Progress Notes (Signed)
Patient is pleasant & cooperative.Denies suicidal or homicidal ideations and AV hallucinations.Appropriate with staff & peers.Compliant with medications.Attended groups.

## 2015-09-12 NOTE — Plan of Care (Signed)
Problem: Alteration in thought process Goal: STG-Patient does not respond to command hallucinations Outcome: Progressing Denies AV hallucinations.

## 2015-09-12 NOTE — Tx Team (Signed)
Interdisciplinary Treatment Plan Update (Adult)         Date: 09/12/2015   Time Reviewed: 9:30 AM   Progress in Treatment: Improving Attending groups: Yes  Participating in groups: Yes  Taking medication as prescribed: Yes  Tolerating medication: Yes  Family/Significant other contact made: Yes, CSW has spoken with the pt's mother  Patient understands diagnosis: Yes  Discussing patient identified problems/goals with staff: Yes  Medical problems stabilized or resolved: Yes  Denies suicidal/homicidal ideation: Yes  Issues/concerns per patient self-inventory: Yes  Other:   New problem(s) identified: N/A   Discharge Plan or Barriers: Pt will discharge home to [facility] to live with her mother and follow up with [facility] for medication management and therapy.   Reason for Continuation of Hospitalization:   Depression   Anxiety   Medication Stabilization   Comments: N/A   Estimated date of discharge: 09/12/15     Patient lives in [place].  Pt is a 45 y.o. female brought to the on 4/16 ED by her mother and was admitted for her chief complaint of agitation and requesting medication adjustment. Patient has a history of schizophrenia who has been experiencing labile mood swings and agitation as well as paranoia. Mother reports patient becomes unbalanced approximately once per year necessitating medication adjustments.  Collateral info obtained in the ER: Ms. Jillson's mother reports that she had a "nervous breakdown again". Mother states "She was thinking she had a child, her and her mother were lovers, that mother stole her money, that [place] on the bill was making her pregnant. She is not recognizing pictures of family members or family members in the room. Mother states that she has been this way for the past 3 days. Mother reports multiple hospitalizations. She reports that she is having auditory and visual hallucination. She is reported as reacting  and responding to internal stimuli. Mother states that she has developmental delay. She is unaware of her IQ. Mother Serrena Linderman) [phone] is her legal guardian. Hospitalized in our facility about 5 times. Her last hospitalization was in 2015. Her diagnosis has ranged from schizophrenia to schizoaffective disorder bipolar type. She has been treated with Haldol decanoate and also Prolixin Decanoate in the past. She has a history of mild intellectual disability and polydipsia. She has presented to the hospital with episodes of hyponatremia. Patient also has history of noncompliance with medications.  Patient currently follows with [facility] Dr. Leticia Penna. In addition to her history of mental illness she has been diagnosed with GERD and hypothyroidism.  I spoke with mother this morning. She reports the patient has being erratic and disorganized for the last 3 days. The mother states that she has been drinking large amounts of fluids. Her sensorium upon admission was 130 to rest of the electrolytes were within the normal limits. Mother denies that the patient has been using any alcohol or illicit substances. Patient is not a smoker. Mother states she gets the medicine to the patient on a daily basis she does not think patient has been missing doses.  Patient will benefit from crisis stabilization, medication evaluation, group therapy, and psycho education in addition to case management for discharge planning. Patient and CSW reviewed pt's identified goals and treatment plan. Pt verbalized understanding and agreed to treatment plan.    Review of initial/current patient goals per problem list:  1. Goal(s): Patient will participate in aftercare plan   Met: Yes  Target date: 3-5 days post admission date  As evidenced by: Patient will participate within aftercare plan AEB aftercare provider and housing plan at discharge being identified.   4/19: Pt will discharge  home to [facility] to live with her mother and follow up with [facility] for medication management and therapy.   2. Goal (s): Patient will exhibit decreased depressive symptoms and suicidal ideations.   Met: No  Target date: 3-5 days post admission date   As evidenced by: Patient will utilize self-rating of depression at 3 or below and demonstrate decreased signs of depression or be deemed stable for discharge by MD.   4/19: Goal progressing.  Pt denies SI/HI.  Pt reports he is safe for discharge.    3. Goal(s): Patient will demonstrate decreased signs and symptoms of anxiety.   Met: No  Target date: 3-5 days post admission date   As evidenced by: Patient will utilize self-rating of anxiety at 3 or below and demonstrated decreased signs of anxiety, or be deemed stable for discharge by MD   4/19: Goal progressing.    4. Goal(s): Patient will demonstrate decreased signs of withdrawal due to substance abuse   Met: Yes  Target date: 3-5 days post admission date   As evidenced by: Patient will produce a CIWA/COWS score of 0, have stable vitals signs, and no symptoms of withdrawal   4/19: Patient produced a CIWA/COWS score of 0, has stable vitals signs, and no symptoms of withdrawal     5. Goal(s): Patient will demonstrate decreased signs of psychosis  * Met: No * Target date: 3-5 days post admission date  * As evidenced by: Patient will demonstrate decreased frequency of AVH or return to baseline function   4/19: Goal progressing.     Attendees:  Patient: Tamara [name] Family:  Physician: Dr. Jerilee Hoh, MD    09/12/2015 10:30 AM  Nursing: , RN       09/12/2015 10:30 AM  Clinical Social Worker: Marylou Flesher, [place]  09/12/2015 10:30 AM  Recreational Therapist: Everitt Amber, LRT   09/12/2015 10:30 AM  Psychologist: Byrd Hesselbach Roush   09/12/2015 10:30 AM  Nursing: Carolynn Sayers    09/12/2015 10:30 AM  Other:        09/12/2015 10:30 AM   Alphonse Guild.  Dujuan Stankowski, LCSWA, LCAS 09/12/15

## 2015-09-12 NOTE — BHH Group Notes (Signed)
[place] Group Notes:  (Nursing/MHT/Case Management/Adjunct)  Date:  09/12/2015  Time:  10:08 PM  Type of Therapy:  Group Therapy  Participation Level:  Active  Participation Quality:  Appropriate  Affect:  Appropriate  Cognitive:  Appropriate  Insight:  Appropriate  Engagement in Group:  Engaged  Modes of Intervention:  Discussion  Summary of Progress/Problems:  Tamara [name] 09/12/2015, 10:08 PM

## 2015-09-12 NOTE — Progress Notes (Signed)
Pleasant and cooperative. Bright affect. Childlike behavior. Showered and changed scrubs. States she's ready to go home to attend the prom at the [facility]. States she paid a lot of the money for the it and hope to go. Interacting with peers and staff. Attends group. Med compliant. Denies SI/HIAV/H. No behavior problems noted. No c/o pain/discomfort noted.

## 2015-09-12 NOTE — Progress Notes (Addendum)
[facility] MD Progress Note  09/12/2015 2:21 PM Tamara [name]  MRN:  [id number] Subjective:  Patient reports doing very well today. She denies having any issues or concerns. Says that she took her medications and they have not caused any problems. She was able to sleep very well last night. Patient denies depressed mood, suicidality, homicidality or having auditory or visual hallucinations. She was has been calm, pleasant and cooperative. She has been participating in programming.  Staff have not reported any aggression or agitation.  She has been eating and sleeping well. She has been compliant with medications   PER NURSING: Pleasant and cooperative. Bright affect. Childlike behavior. Showered and changed scrubs. States she's ready to go home to attend the prom at the [facility]. States she paid a lot of the money for the it and hope to go. Interacting with peers and staff. Attends group. Med compliant. Denies SI/HIAV/H. No behavior problems noted. No c/o pain/discomfort noted.   Principal Problem: Schizophrenia ([place]) Diagnosis:   Patient Active Problem List   Diagnosis Date Noted  . GERD (gastroesophageal reflux disease) [K21.9] 09/11/2015  . Seborrheic dermatitis [L21.9] 09/11/2015  . 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 Schizophrenia ([place]) [F20.9] 09/10/2015  . 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 Thyroid disease   . Schizophrenia ([place])   . IUD (intrauterine device) in place IUD was placed in 2016  . Leiomyoma of uterus   . GERD (gastroesophageal reflux disease)    History reviewed. No pertinent past surgical history. Family History: History reviewed. No pertinent family history.  Social History:  History  Alcohol Use No     History  Drug Use No    Social History   Social History  . Marital Status:  Single    Spouse Name: N/A  . Number of Children: N/A  . Years of Education: N/A   Social History Main Topics  . Smoking status: Never Smoker   . Smokeless tobacco: None  . Alcohol Use: No  . Drug Use: No  . Sexual Activity: Yes    Birth Control/ Protection: Surgical   Other Topics Concern  . None   Social History Narrative    Current Medications: Current Facility-Administered Medications  Medication Dose Route Frequency Provider Last Rate Last Dose  . acetaminophen (TYLENOL) tablet 650 mg  650 mg Oral Q6H PRN Gonzella Lex, MD      . alum & mag hydroxide-simeth (MAALOX/MYLANTA) 200-200-20 MG/5ML suspension 30 mL  30 mL Oral Q4H PRN Gonzella Lex, MD      . amantadine (SYMMETREL) capsule 100 mg  100 mg Oral QHS Hildred Priest, MD      . levothyroxine (SYNTHROID, LEVOTHROID) tablet 75 mcg  75 mcg Oral QAC breakfast Gonzella Lex, MD   75 mcg at 09/12/15 0719  . magnesium hydroxide (MILK OF MAGNESIA) suspension 30 mL  30 mL Oral Daily PRN Gonzella Lex, MD      . OLANZapine zydis (ZYPREXA) disintegrating tablet 15 mg  15 mg Oral QHS Hildred Priest, MD   15 mg at 09/11/15 2138  . pantoprazole (PROTONIX) EC tablet 40 mg  40 mg Oral Daily Gonzella Lex, MD   40 mg at 09/12/15 0857  . [START ON 09/13/2015] potassium chloride SA (K-DUR,KLOR-CON) CR tablet 20 mEq  20 mEq Oral QHS Hildred Priest, MD      .  valproic acid (DEPAKENE) 250 MG capsule 2,250 mg  2,250 mg Oral QHS Hildred Priest, MD   2,250 mg at 09/11/15 2217    Lab Results:  Results for orders placed or performed during the hospital encounter of 09/10/15 (from the past 48 hour(s))  Hemoglobin A1c     Status: None   Collection Time: 09/11/15  6:36 AM  Result Value Ref Range   Hgb A1c MFr Bld 5.5 4.0 - 6.0 %  Lipid panel     Status: Abnormal   Collection Time: 09/11/15  6:36 AM  Result Value Ref Range   Cholesterol 240 (H) 0 - 200 mg/dL   Triglycerides 145 <150 mg/dL   HDL 84  >40 mg/dL   Total CHOL/HDL Ratio 2.9 RATIO   VLDL 29 0 - 40 mg/dL   LDL Cholesterol 127 (H) 0 - 99 mg/dL    Comment:        Total Cholesterol/HDL:CHD Risk Coronary Heart Disease Risk Table                     Men   Women  1/2 Average Risk   3.4   3.3  Average Risk       5.0   4.4  2 X Average Risk   9.6   7.1  3 X Average Risk  23.4   11.0        Use the calculated Patient Ratio above and the CHD Risk Table to determine the patient's CHD Risk.        ATP III CLASSIFICATION (LDL):  <100     mg/dL   Optimal  100-129  mg/dL   Near or Above                    Optimal  130-159  mg/dL   Borderline  160-189  mg/dL   High  >190     mg/dL   Very High   Prolactin     Status: Abnormal   Collection Time: 09/11/15  6:36 AM  Result Value Ref Range   Prolactin 163.4 (H) 4.8 - 23.3 ng/mL    Comment: (NOTE) Performed At: [facility] [place], [place] [id number] Lindon Romp MD [id number]   TSH     Status: None   Collection Time: 09/11/15  6:36 AM  Result Value Ref Range   TSH 3.373 0.350 - 4.500 uIU/mL  Valproic acid level     Status: Abnormal   Collection Time: 09/11/15  6:36 AM  Result Value Ref Range   Valproic Acid Lvl 41 (L) 50.0 - 100.0 ug/mL  Basic metabolic panel     Status: None   Collection Time: 09/11/15  6:36 AM  Result Value Ref Range   Sodium 138 135 - 145 mmol/L   Potassium 3.9 3.5 - 5.1 mmol/L   Chloride 101 101 - 111 mmol/L   CO2 28 22 - 32 mmol/L   Glucose, Bld 99 65 - 99 mg/dL   BUN 8 6 - 20 mg/dL   Creatinine, Ser 0.81 0.44 - 1.00 mg/dL   Calcium 10.0 8.9 - 10.3 mg/dL   GFR calc non Af Amer >60 >60 mL/min   GFR calc Af Amer >60 >60 mL/min    Comment: (NOTE) The eGFR has been calculated using the CKD EPI equation. This calculation has not been validated in all clinical situations. eGFR's persistently <60 mL/min signify possible Chronic Kidney Disease.    Anion gap 9 5 -  15    Blood Alcohol level:  Lab Results  Component  Value Date   ETH <5 09/09/2015    Physical Findings: AIMS:  , ,  ,  , Dental Status Current problems with teeth and/or dentures?: No Does patient usually wear dentures?: No  CIWA:    COWS:     Musculoskeletal: Strength & Muscle Tone: within normal limits Gait & Station: normal Patient leans: N/A  Psychiatric Specialty Exam: Review of Systems  Constitutional: Negative.   HENT: Negative.   Eyes: Negative.   Respiratory: Negative.   Cardiovascular: Negative.   Gastrointestinal: Negative.   Genitourinary: Negative.   Musculoskeletal: Negative.   Skin: Negative.   Neurological: Negative.   Endo/Heme/Allergies: Negative.   Psychiatric/Behavioral: Negative.     Blood pressure 110/65, pulse 97, temperature 97.9 F (36.6 C), temperature source Oral, resp. rate 18, height 5' 2" (1.575 m), weight 87.544 kg (193 lb), SpO2 100 %.Body mass index is 35.29 kg/(m^2).  General Appearance: Fairly Groomed  Engineer, water::  Good  Speech:  Clear and Coherent  Volume:  Normal  Mood:  Euthymic  Affect:  Congruent  Thought Process:  concrete  Orientation:  Full (Time, Place, and Person)  Thought Content:  Hallucinations: None  Suicidal Thoughts:  No  Homicidal Thoughts:  No  Memory:  Immediate;   Good Recent;   Good Remote;   Good  Judgement:  Poor  Insight:  Shallow  Psychomotor Activity:  Normal  Concentration:  Fair  Recall:  Poor  Fund of Knowledge:Poor  Language: Fair  Akathisia:  No  Handed:    AIMS (if indicated):     Assets:  Agricultural consultant Housing Physical Health Social Support  ADL's:  Intact  Cognition: WNL  Sleep:  Number of Hours: 7.15   Treatment Plan Summary:  Schizophrenia versus schizoaffective disorder. I reviewed her current medications patient is receiving Haldol decanoate 100 mg every 28 days, unknown as to when she last received the injection. She is also maintained on olanzapine disintegrating tablet 15 mg at bedtime  and Haldol 0.5 mg by mouth twice a day. In order to simplify her medication regimen I will discontinue the Haldol 0.5 mg twice a day.   Patient is also on Depakote 2250 MG at bedtime. She is prescribed with liquid Depakote. Unfortunately a Depakote level was not checked in the emergency department. Random level was 41 yesterday  EPS patient is currently taking benztropine 0.5 mg by mouth twice a day. Due to hyperprolactinemia I will discontinue benztropine and the staff amantadine 100 mg by mouth daily at bedtime.  Hyperprolactinemia: Lacking is currently elevated at 163. Patient is asymptomatic. I will start the patient on amantadine 100 mg by mouth daily at bedtime.  Hypothyroidism the patient will be continued on Synthroid 75 g a day. TSH was checked and was within normal limits  Hyponatremia: At admission sodium was 131. Sodium on 4/18 was 138  Hypokalemia: [place] patient patient was maintained oN K-Dur 20 milliequivalents twice a day.  Potassium is 3.9. I will change stay K-Dur 20 20 mEq a day.  For GERD patient will be continued on Protonix 40 mg a day  Labs: Hemoglobin A1c was within normal limits. Prolactin was elevated at 163. TSH is within the normal limits. Random Depakote level was 41. Electrolytes are not within the normal limits  Precautions every 15 minute checks  Hospitalization and status continue involuntary commitment  Diet regular  Disposition patient will discharge back to her mother's care  once stable--- we'll plan to have the mother come and visit with the patient as we feels that the patient might be very close to her baseline.  Follow-up the patient will continue to follow-up with [facility]    Hildred Priest, MD 09/12/2015, 2:21 PM

## 2015-09-13 DIAGNOSIS — F54 Psychological and behavioral factors associated with disorders or diseases classified elsewhere: Secondary | ICD-10-CM

## 2015-09-13 DIAGNOSIS — R631 Polydipsia: Secondary | ICD-10-CM

## 2015-09-13 MED ORDER — AMANTADINE HCL 100 MG PO CAPS: 100.0000 mg | ORAL_CAPSULE | Freq: Every day | ORAL | Status: DC

## 2015-09-13 MED ORDER — CETIRIZINE HCL 10 MG PO TABS: 10.0000 mg | ORAL_TABLET | Freq: Every day | ORAL | Status: DC | PRN

## 2015-09-13 MED ORDER — POTASSIUM CHLORIDE CRYS ER 20 MEQ PO TBCR: 20.0000 meq | EXTENDED_RELEASE_TABLET | Freq: Every day | ORAL | Status: DC

## 2015-09-13 NOTE — Progress Notes (Signed)
  [facility] Adult Case Management Discharge Plan :  Will you be returning to the same living situation after discharge:  Yes,  pt will be returning home to [facility] to live with her mother At discharge, do you have transportation home?: Yes,  pt will be picked up by her mother Do you have the ability to pay for your medications: Yes,  pt will be provided with medications at discharge  Release of information consent forms completed and in the chart;  [name] signature needed at discharge.  Patient to Follow up at: Follow-up Information    Follow up with [facility].   Why:  Please call to schedule your hospital follow up for medication managment and therapy upon discharge   Contact information:   [address] [place], [place] [address] Phone: [phone] Fax: [phone]      Next level of care provider has access to [place] and Suicide Prevention discussed: Yes,  completed with pt  Have you used any form of tobacco in the last 30 days? (Cigarettes, Smokeless Tobacco, Cigars, and/or Pipes): No  Has patient been referred to the Quitline?: N/A patient is not a smoker  Patient has been referred for addiction treatment: N/A  Claudine Mouton 09/13/2015, 11:47 AM

## 2015-09-13 NOTE — Plan of Care (Signed)
Problem: Ineffective individual coping Goal: LTG: Patient will report a decrease in negative feelings Outcome: Progressing Patient reports feeling better today than she did yesterday     

## 2015-09-13 NOTE — Plan of Care (Signed)
Problem: Alteration in thought process Goal: LTG-Patient is able to perceive the environment accurately Outcome: Progressing Patient appears to be perceiving her environment accurately this shift

## 2015-09-13 NOTE — Discharge Summary (Addendum)
Physician Discharge Summary Note  Patient:  Tamara [name] is an 45 y.o., female MRN:  [id number] DOB:  11/08/1970 Patient phone:  [phone] (home)  Patient address:   [address] [place] [address],  Total Time spent with patient: 45 minutes  Date of Admission:  09/10/2015 Date of Discharge: 09/13/15  Reason for Admission:  psychosis  Principal Problem: Schizophrenia (HCC) Discharge Diagnoses: Patient Active Problem List   Diagnosis Date Noted  . GERD (gastroesophageal reflux disease) [K21.9] 09/11/2015  . Seborrheic dermatitis [L21.9] 09/11/2015  . 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 Schizophrenia (HCC) [F20.9] 09/10/2015  . 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 female brought to the on 4/16 ED by her mother with a chief complaint of agitation and requesting medication adjustment. Patient has a history of schizophrenia who has been experiencing labile mood swings and agitation as well as paranoia. Mother reports patient becomes unbalanced approximately once per year necessitating medication adjustments.   Collateral info obtained in the ER: Tamara [name]'s mother reports that she had a "nervous breakdown again". Mother states "She was thinking she had a child, her and her mother were lovers, that mother stole her money, that [name] on the bill was making her pregnant. She is not recognizing pictures of family members or family members in the room. Mother states that she has been this way for the past 3 days. Mother reports multiple hospitalizations. She reports that she is having auditory and visual hallucination. She is reported as reacting and responding to internal stimuli. Mother states that she has developmental delay. She is unaware of her IQ. Mother (Sandra Hocutt) [phone] is her legal guardian.  Hospitalized in  our facility about 5 times. Her last hospitalization was in 2015. Her diagnosis has ranged from schizophrenia to schizoaffective disorder bipolar type. She has been treated with Haldol decanoate and also Prolixin Decanoate in the past. She has a history of mild intellectual disability and polydipsia. She has presented to the hospital with episodes of hyponatremia. Patient also has history of noncompliance with medications.  Patient [facility] Dr. Matthew Zeitler. In addition to her history of mental illness she has been diagnosed with GERD and hypothyroidism.  I spoke with mother this morning. She reports the patient has being erratic and disorganized for the last 3 days. The mother states that she has been drinking large amounts of fluids. Her sensorium upon admission was 130 to rest of the electrolytes were within the normal limits. Mother denies that the patient has been using any alcohol or illicit substances. Patient is not a smoker. Mother states she gets the medicine to the patient on a daily basis she does not think patient has been missing doses.  Associated Signs/Symptoms: Depression Symptoms: denies (Hypo) Manic Symptoms: denies Anxiety Symptoms: denies Psychotic Symptoms: denie PTSD Symptoms: NA  Past Psychiatric History: Multiple prior hospitalizations. No prior history of suicidal attempts. No prior history of self-injurious behaviors. Currently follows up [facility]  Past Medical History:  Past Medical History  Diagnosis Date  . Thyroid disease   . Schizophrenia (HCC)   . IUD (intrauterine device) in place IUD was placed in 2016  . Leiomyoma of uterus   . GERD (gastroesophageal reflux disease)    History reviewed. No pertinent past surgical history. Family History: History reviewed. No pertinent family history.  Social   History: Patient lives with her mother. Her mother is her legal guardian. Her  mother's name is Sandra Broman something her telephone number is [phone]. Patient completed the 12th grade. History  Alcohol Use No     History  Drug Use No    Social History   Social History  . Marital Status: Single    Spouse Name: N/A  . Number of Children: N/A  . Years of Education: N/A   Social History Main Topics  . Smoking status: Never Smoker   . Smokeless tobacco: None  . Alcohol Use: No  . Drug Use: No  . Sexual Activity: Yes    Birth Control/ Protection: Surgical   Other Topics Concern  . None   Social History Narrative    Hospital Course:   Schizophrenia versus schizoaffective disorder. I reviewed her current medications patient is receiving Haldol decanoate 100 mg every 28 days, unknown as to when she last received the injection. No injections were given during her hospitalization here. She is also maintained on olanzapine disintegrating tablet 15 mg at bedtime and Haldol 0.5 mg by mouth twice a day. In order to simplify her medication regimen I will discontinue the Haldol 0.5 mg twice a day.   Patient is also on Depakote 2250 MG at bedtime. She is prescribed with liquid Depakote. Unfortunately a Depakote level was not checked in the emergency department. Random level was 41 on 4/19  EPS patient is currently taking benztropine 0.5 mg by mouth twice a day. Due to hyperprolactinemia I will discontinue benztropine and the staff amantadine 100 mg by mouth daily at bedtime. Amantadine can address hyperprolactinemia and prevent EPS  Hyperprolactinemia: Prolactin is currently elevated at 163. Patient is asymptomatic. I will start the patient on amantadine 100 mg by mouth daily at bedtime.  Hypothyroidism the patient will be continued on Synthroid 75 g a day. TSH was checked and was within normal limits  Hyponatremia: At admission sodium was 131. Sodium on 4/18 was 138  Hypokalemia: [place] patient patient was maintained oN K-Dur 20 milliequivalents twice a day.  Potassium is 3.9. I will change stay K-Dur 20 20 mEq a day.  For GERD patient will be continued on Protonix 40 mg a day  Labs: Hemoglobin A1c was within normal limits. Prolactin was elevated at 163. TSH is within the normal limits. Random Depakote level was 41. Electrolytes are not within the normal limits  Follow-up the patient will continue to follow-up with [facility]  During her hospitalization the patient was pleasant, calm and cooperative. She did not require seclusion, restraints or forced medications. She was compliant with medications. She participated in programming. She did not display any unsafe or destructive behaviors.  This hospitalization was uneventful.  Family meeting was held upon discharge. The patient's mother and her stepfather reported that she drinks liquids excessively throughout the day including milk, juice, water and a large amount of sodas. It was explained to the family that likely these episodes of belligerent behavior and agitation are caused by hyponatremia and delirium and not by psychosis.  There were encouraged to supervise liquid intake closely in order to prevent future episodes of hyponatremia. We discussed the possibility of a Clozaril trial in the near future.  My recommendation for her outpatient provider is to consider a trial of Clozaril as this is the only medication that has some evidence to reduce psychogenic polydipsia.   Physical Findings: AIMS:  , ,  ,  , Dental Status Current problems with teeth and/or   dentures?: No Does patient usually wear dentures?: No  CIWA:    COWS:     Musculoskeletal: Strength & Muscle Tone: within normal limits Gait & Station: normal Patient leans: N/A  Psychiatric Specialty Exam: Review of Systems  Constitutional: Negative.   HENT: Negative.   Eyes: Negative.   Respiratory: Negative.   Cardiovascular: Negative.   Gastrointestinal: Negative.   Genitourinary: Negative.   Musculoskeletal:  Negative.   Skin: Negative.   Neurological: Negative.   Endo/Heme/Allergies: Negative.   Psychiatric/Behavioral: Negative.     Blood pressure 107/43, pulse 91, temperature 98.7 F (37.1 C), temperature source Oral, resp. rate 18, height 5' 2" (1.575 m), weight 87.544 kg (193 lb), SpO2 100 %.Body mass index is 35.29 kg/(m^2).  General Appearance: Well Groomed  Eye Contact::  Good  Speech:  Clear and Coherent  Volume:  Normal  Mood:  Euthymic  Affect:  Appropriate  Thought Process:  concrete  Orientation:  Full (Time, Place, and Person)  Thought Content:  Hallucinations: None  Suicidal Thoughts:  No  Homicidal Thoughts:  No  Memory:  Immediate;   Fair Recent;   Fair Remote;   Fair  Judgement:  Poor  Insight:  Shallow  Psychomotor Activity:  Normal  Concentration:  Fair  Recall:  Fair  Fund of Knowledge:Poor  Language: Fair  Akathisia:  No  Handed:    AIMS (if indicated):     Assets:  Communication Skills Financial Resources/Insurance Housing Physical Health Social Support  ADL's:  Intact  Cognition: WNL  Sleep:  Number of Hours: 7   Have you used any form of tobacco in the last 30 days? (Cigarettes, Smokeless Tobacco, Cigars, and/or Pipes): No  Has this patient used any form of tobacco in the last 30 days? (Cigarettes, Smokeless Tobacco, Cigars, and/or Pipes) Yes, No  Blood Alcohol level:  Lab Results  Component Value Date   ETH <5 09/09/2015    Metabolic Disorder Labs:  Lab Results  Component Value Date   HGBA1C 5.5 09/11/2015   Lab Results  Component Value Date   PROLACTIN 163.4* 09/11/2015   Lab Results  Component Value Date   CHOL 240* 09/11/2015   TRIG 145 09/11/2015   HDL 84 09/11/2015   CHOLHDL 2.9 09/11/2015   VLDL 29 09/11/2015   LDLCALC 127* 09/11/2015   Results for Bona, Antwonette B (MRN [id number]) as of 09/13/2015 11:31  Ref. Range 09/09/2015 21:07 09/09/2015 21:21 09/11/2015 06:36  Sodium Latest Ref Range: 135-145 mmol/L 130 (L)  138   Potassium Latest Ref Range: 3.5-5.1 mmol/L 3.3 (L)  3.9  Chloride Latest Ref Range: 101-111 mmol/L 96 (L)  101  CO2 Latest Ref Range: 22-32 mmol/L 23  28  BUN Latest Ref Range: 6-20 mg/dL <5 (L)  8  Creatinine Latest Ref Range: 0.44-1.00 mg/dL 0.84  0.81  Calcium Latest Ref Range: 8.9-10.3 mg/dL 9.7  10.0  EGFR (Non-African Amer.) Latest Ref Range: >60 mL/min >60  >60  EGFR (African American) Latest Ref Range: >60 mL/min >60  >60  Glucose Latest Ref Range: 65-99 mg/dL 135 (H)  99  Anion gap Latest Ref Range: 5-15  11  9  Alkaline Phosphatase Latest Ref Range: 38-126 U/L 95    Albumin Latest Ref Range: 3.5-5.0 g/dL 4.5    AST Latest Ref Range: 15-41 U/L 34    ALT Latest Ref Range: 14-54 U/L 27    Total Protein Latest Ref Range: 6.5-8.1 g/dL 8.0    Total Bilirubin Latest Ref Range: 0.3-1.2 mg/dL 0.7      Cholesterol Latest Ref Range: 0-200 mg/dL   240 (H)  Triglycerides Latest Ref Range: <150 mg/dL   145  HDL Cholesterol Latest Ref Range: >40 mg/dL   84  LDL (calc) Latest Ref Range: 0-99 mg/dL   127 (H)  VLDL Latest Ref Range: 0-40 mg/dL   29  Total CHOL/HDL Ratio Latest Units: RATIO   2.9  WBC Latest Ref Range: 3.6-11.0 K/uL 10.4    RBC Latest Ref Range: 3.80-5.20 MIL/uL 4.73    Hemoglobin Latest Ref Range: 12.0-16.0 g/dL 13.4    HCT Latest Ref Range: 35.0-47.0 % 39.0    MCV Latest Ref Range: 80.0-100.0 fL 82.6    MCH Latest Ref Range: 26.0-34.0 pg 28.4    MCHC Latest Ref Range: 32.0-36.0 g/dL 34.4    RDW Latest Ref Range: 11.5-14.5 % 13.8    Platelets Latest Ref Range: 150-440 K/uL 282    Neutrophils Latest Units: % 57    Lymphocytes Latest Units: % 29    Monocytes Relative Latest Units: % 13    Eosinophil Latest Units: % 1    Basophil Latest Units: % 0    NEUT# Latest Ref Range: 1.4-6.5 K/uL 5.9    Lymphocyte # Latest Ref Range: 1.0-3.6 K/uL 3.0    Monocyte # Latest Ref Range: 0.2-0.9 K/uL 1.3 (H)    Eosinophils Absolute Latest Ref Range: 0-0.7 K/uL 0.1    Basophils Absolute  Latest Ref Range: 0-0.1 K/uL 0.0    Acetaminophen (Tylenol), S Latest Ref Range: 10-30 ug/mL <10 (L)    Salicylate Lvl Latest Ref Range: 2.8-30.0 mg/dL <4.0    Valproic Acid,S Latest Ref Range: 50.0-100.0 ug/mL   41 (L)  Prolactin Latest Ref Range: 4.8-23.3 ng/mL   163.4 (H)  Hemoglobin A1C Latest Ref Range: 4.0-6.0 %   5.5  Preg Test, Ur Latest Ref Range: NEGATIVE   NEGATIVE   TSH Latest Ref Range: 0.350-4.500 uIU/mL   3.373  Appearance Latest Ref Range: CLEAR  CLEAR (A)    Bacteria, UA Latest Ref Range: NONE SEEN  NONE SEEN    Bilirubin Urine Latest Ref Range: NEGATIVE  NEGATIVE    Color, Urine Latest Ref Range: YELLOW  STRAW (A)    Glucose Latest Ref Range: NEGATIVE mg/dL NEGATIVE    Hgb urine dipstick Latest Ref Range: NEGATIVE  2+ (A)    Ketones, ur Latest Ref Range: NEGATIVE mg/dL NEGATIVE    Leukocytes, UA Latest Ref Range: NEGATIVE  1+ (A)    Nitrite Latest Ref Range: NEGATIVE  NEGATIVE    pH Latest Ref Range: 5.0-8.0  7.0    Protein Latest Ref Range: NEGATIVE mg/dL NEGATIVE    RBC / HPF Latest Ref Range: 0-5 RBC/hpf 0-5    Specific Gravity, Urine Latest Ref Range: 1.005-1.030  1.001 (L)    Squamous Epithelial / LPF Latest Ref Range: NONE SEEN  0-5 (A)    WBC, UA Latest Ref Range: 0-5 WBC/hpf 6-30    Alcohol, Ethyl (B) Latest Ref Range: <5 mg/dL <5    Amphetamines, Ur Screen Latest Ref Range: NONE DETECTED  NONE DETECTED    Barbiturates, Ur Screen Latest Ref Range: NONE DETECTED  NONE DETECTED    Benzodiazepine, Ur Scrn Latest Ref Range: NONE DETECTED  NONE DETECTED    Cocaine Metabolite,Ur [place] Latest Ref Range: NONE DETECTED  NONE DETECTED    Methadone Scn, Ur Latest Ref Range: NONE DETECTED  NONE DETECTED    MDMA (Ecstasy)Ur Screen Latest Ref Range: NONE DETECTED  NONE DETECTED      Cannabinoid 50 Ng, Ur [place] Latest Ref Range: NONE DETECTED  NONE DETECTED    Opiate, Ur Screen Latest Ref Range: NONE DETECTED  NONE DETECTED    Phencyclidine (PCP) Ur S Latest Ref Range: NONE  DETECTED  NONE DETECTED    Tricyclic, Ur Screen Latest Ref Range: NONE DETECTED  NONE DETECTED     See Psychiatric Specialty Exam and Suicide Risk Assessment completed by Attending Physician prior to discharge.  Discharge destination:  Home  Is patient on multiple antipsychotic therapies at discharge:  Yes,   Do you recommend tapering to monotherapy for antipsychotics?  Yes   Has Patient had three or more failed trials of antipsychotic monotherapy by history: unknown  Recommended Plan for Multiple Antipsychotic Therapies: NA     Medication List    STOP taking these medications        benztropine 0.5 MG tablet  Commonly known as:  COGENTIN     haloperidol 0.5 MG tablet  Commonly known as:  HALDOL     hydrOXYzine 50 MG tablet  Commonly known as:  ATARAX/VISTARIL     ranitidine 15 MG/ML syrup  Commonly known as:  ZANTAC      TAKE these medications      Indication   amantadine 100 MG capsule  Commonly known as:  SYMMETREL  Take 1 capsule (100 mg total) by mouth at bedtime.  Notes to Patient:  hyperprolactimenia and prevent EPS      cetirizine 10 MG tablet  Commonly known as:  ZYRTEC  Take 1 tablet (10 mg total) by mouth daily as needed for allergies.      haloperidol decanoate 100 MG/ML injection  Commonly known as:  HALDOL DECANOATE  Inject 100 mg into the muscle every 28 (twenty-eight) days.  Notes to Patient:  schizophrenia      levothyroxine 75 MCG tablet  Commonly known as:  SYNTHROID, LEVOTHROID  Take 75 mcg by mouth daily before breakfast.  Notes to Patient:  hypothyroidism      olanzapine zydis 15 MG disintegrating tablet  Commonly known as:  ZYPREXA  Take 15 mg by mouth at bedtime.  Notes to Patient:  schizophrenia      potassium chloride SA 20 MEQ tablet  Commonly known as:  K-DUR,KLOR-CON  Take 1 tablet (20 mEq total) by mouth at bedtime.  Notes to Patient:  Low potassium      Valproic Acid 250 MG/5ML Soln  Take 45 mLs by mouth at bedtime.   Notes to Patient:  mood            Follow-up Information    Follow up with [facility].   Why:  Please arrive for your assessment for medication managment and therapy   Contact information:   [address] , [place] [address] Phone: [phone] Fax: [phone]     >30 minutes. More than 50% of time was as spending coordination of care.  Signed: Hernandez-Gonzalez,  , MD 09/13/2015, 8:56 AM 

## 2015-09-13 NOTE — Progress Notes (Signed)
D:  Patient expresses readiness for discharge today.  Patient denies any pain currently.  Patient denies suicidal ideation, homicidal ideation, auditory or visual hallucinations currently. A:  Medications and instructions for their use were reviewed with the patient and she voiced understanding.  Discharge instructions and follow up were reviewed with the patient.  [name] belongings were returned upon her leaving the unit. R:  Patient signed for the return of her belongings.  Patient was cooperative with the discharge process.  Patient expressed understanding of discharge instructions, follow up, medications and their use.  Patient was escorted off the unit.  Patient remains safe at the time of discharge.

## 2015-09-13 NOTE — Plan of Care (Signed)
Problem: Ineffective individual coping Goal: STG: Patient will remain free from self harm Outcome: Not Applicable Date Met:  94/07/[birth year] Calm and cooperative. Bright affect. Med compliant. Attends group. No voiced thoughts of hurting herself. Will continue to monitor for safety.

## 2015-09-13 NOTE — [facility] Suicide Risk Assessment (Signed)
[facility] Discharge Suicide Risk Assessment   Principal Problem: Schizophrenia [facility]) Discharge Diagnoses:  Patient Active Problem List   Diagnosis Date Noted  . GERD (gastroesophageal reflux disease) [K21.9] 09/11/2015  . Seborrheic dermatitis [L21.9] 09/11/2015  . 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 Schizophrenia ([place]) [F20.9] 09/10/2015  . 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 rate 18, height 5\' 2"  (1.575 m), weight 87.544 kg (193 lb), SpO2 100 %.Body mass index is 35.29 kg/(m^2).                                                       Mental Status Per Nursing Assessment::   On Admission:  NA  Demographic Factors:  AAM, with mild intellectual disability, lives with mother  Loss Factors: NA  Historical Factors: Impulsivity  Risk Reduction Factors:   Sense of responsibility to family, Religious beliefs about death and Positive social support  Continued Clinical Symptoms:  Previous Psychiatric Diagnoses and Treatments  Cognitive Features That Contribute To Risk:  Closed-mindedness    Suicide Risk:  Minimal: No identifiable suicidal ideation.  Patients presenting with no risk factors but with morbid ruminations; may be classified as minimal risk based on the severity of the depressive symptoms  Follow-up Information    Follow up with [facility].   Why:  Please arrive for your assessment for medication managment and therapy   Contact information:   [address] [place], [place] [address] Phone: [phone] Fax: [phone]       Hildred Priest, MD 09/13/2015, 8:53 AM

## 2015-09-13 NOTE — Progress Notes (Signed)
Recreation Therapy Notes  Date: 04.20.17 Time: 9:30 am Location: Craft Room  Group Topic: Leisure Education  Goal Area(s) Addresses:  Patient will identify things they are grateful for. Patient will identify why it is important to remember the things they are grateful for.  Behavioral Response: Attentive, Interactive  Intervention: Grateful Wheel  Activity: Patients were given an I Am Grateful For worksheet and instructed to write 1-2 things they are grateful for under each category.  Education: LRT educated patients on why it is important to be grateful.  Education Outcome: In group clarification offered  Clinical Observations/Feedback: Patient completed activity by writing down things she was grateful for. Patient contributed to group discussion by stating things she is grateful for.  Leonette Monarch, LRT/CTRS 09/13/2015 10:17 AM
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
## 2015-09-14 LAB — URINE CULTURE: Culture: 40000 — AB

## 2015-09-16 NOTE — Progress Notes (Signed)
Pharmacy Note - Antibiotic Follow-Up  Patient seen in ED 4/17 and admitted to behavioral medicine.   Urine culture done in ED 4/17 growing 40k pseudomonas sensitive to cipro.  Discussed with Dr. Archie Balboa, who would like to treat. MD authorized prescription for ciprofloxacin 250mg  PO BID x 3 days. Spoke with patient's mother and called prescription to [facility] pharmacy in [place].  Rexene Edison, PharmD Clinical Pharmacist  09/16/2015 3:24 PM

## 2016-02-13 DIAGNOSIS — E039 Hypothyroidism, unspecified: Secondary | ICD-10-CM | POA: Insufficient documentation

## 2016-02-13 DIAGNOSIS — Z550 Illiteracy and low-level literacy: Secondary | ICD-10-CM | POA: Insufficient documentation

## 2016-02-13 DIAGNOSIS — E669 Obesity, unspecified: Secondary | ICD-10-CM | POA: Insufficient documentation

## 2016-08-22 DIAGNOSIS — Z831 Family history of other infectious and parasitic diseases: Secondary | ICD-10-CM | POA: Insufficient documentation

## 2016-08-22 DIAGNOSIS — Z201 Contact with and (suspected) exposure to tuberculosis: Secondary | ICD-10-CM | POA: Insufficient documentation

## 2017-02-24 ENCOUNTER — Emergency Department
Admission: EM | Admit: 2017-02-24 | Discharge: 2017-02-25 | Disposition: A | Discharge status: Home / Self Care | Payer: Medicaid Other | Attending: Emergency Medicine | Admitting: Emergency Medicine

## 2017-02-24 ENCOUNTER — Encounter: Payer: Self-pay | Admitting: Emergency Medicine

## 2017-02-24 DIAGNOSIS — E031 Congenital hypothyroidism without goiter: Secondary | ICD-10-CM | POA: Diagnosis not present

## 2017-02-24 DIAGNOSIS — E876 Hypokalemia: Secondary | ICD-10-CM | POA: Diagnosis not present

## 2017-02-24 DIAGNOSIS — F70 Mild intellectual disabilities: Secondary | ICD-10-CM | POA: Diagnosis not present

## 2017-02-24 DIAGNOSIS — Z79899 Other long term (current) drug therapy: Secondary | ICD-10-CM | POA: Diagnosis not present

## 2017-02-24 DIAGNOSIS — F99 Mental disorder, not otherwise specified: Secondary | ICD-10-CM | POA: Diagnosis present

## 2017-02-24 DIAGNOSIS — E871 Hypo-osmolality and hyponatremia: Secondary | ICD-10-CM | POA: Diagnosis not present

## 2017-02-24 DIAGNOSIS — F209 Schizophrenia, unspecified: Secondary | ICD-10-CM | POA: Diagnosis not present

## 2017-02-24 LAB — URINE DRUG SCREEN, QUALITATIVE (ARMC ONLY)
Amphetamines, Ur Screen: NOT DETECTED
BARBITURATES, UR SCREEN: NOT DETECTED
Benzodiazepine, Ur Scrn: NOT DETECTED
CANNABINOID 50 NG, UR ~~LOC~~: NOT DETECTED
COCAINE METABOLITE, UR ~~LOC~~: NOT DETECTED
MDMA (ECSTASY) UR SCREEN: NOT DETECTED
Methadone Scn, Ur: NOT DETECTED
Opiate, Ur Screen: NOT DETECTED
PHENCYCLIDINE (PCP) UR S: NOT DETECTED
Tricyclic, Ur Screen: NOT DETECTED

## 2017-02-24 LAB — SALICYLATE LEVEL: Salicylate Lvl: <7 mg/dL (ref 2.8–30.0)

## 2017-02-24 LAB — CBC
HCT: 40.4 % (ref 35.0–47.0)
HEMOGLOBIN: 13.7 g/dL (ref 12.0–16.0)
MCH: 28.6 pg (ref 26.0–34.0)
MCHC: 33.9 g/dL (ref 32.0–36.0)
MCV: 84.2 fL (ref 80.0–100.0)
PLATELETS: 324 10*3/uL (ref 150–440)
RBC: 4.79 MIL/uL (ref 3.80–5.20)
RDW: 13.5 % (ref 11.5–14.5)
WBC: 10.6 10*3/uL (ref 3.6–11.0)

## 2017-02-24 LAB — COMPREHENSIVE METABOLIC PANEL
ALK PHOS: 90 U/L (ref 38–126)
ALT: 22 U/L (ref 14–54)
AST: 29 U/L (ref 15–41)
Albumin: 4.8 g/dL (ref 3.5–5.0)
Anion gap: 14 (ref 5–15)
BILIRUBIN TOTAL: 0.8 mg/dL (ref 0.3–1.2)
BUN: <5 mg/dL — ABNORMAL LOW (ref 6–20)
CALCIUM: 9.8 mg/dL (ref 8.9–10.3)
CO2: 24 mmol/L (ref 22–32)
Chloride: 89 mmol/L — ABNORMAL LOW (ref 101–111)
Creatinine, Ser: 0.77 mg/dL (ref 0.44–1.00)
GFR calc non Af Amer: >60 mL/min (ref >60)
Glucose, Bld: 110 mg/dL — ABNORMAL HIGH (ref 65–99)
POTASSIUM: 3.2 mmol/L — AB (ref 3.5–5.1)
SODIUM: 127 mmol/L — AB (ref 135–145)
TOTAL PROTEIN: 8.8 g/dL — AB (ref 6.5–8.1)

## 2017-02-24 LAB — ETHANOL

## 2017-02-24 LAB — POCT PREGNANCY, URINE: Preg Test, Ur: NEGATIVE

## 2017-02-24 LAB — ACETAMINOPHEN LEVEL: Acetaminophen (Tylenol), Serum: <10 ug/mL — ABNORMAL LOW (ref 10–30)

## 2017-02-24 MED ORDER — POTASSIUM CHLORIDE CRYS ER 20 MEQ PO TBCR
40.0000 meq | EXTENDED_RELEASE_TABLET | Freq: Once | ORAL | Status: AC
  Administered 2017-02-25: 40 meq via ORAL
  Filled 2017-02-24: qty 2

## 2017-02-24 MED ORDER — SODIUM CHLORIDE 0.9 % IV BOLUS (SEPSIS)
1000.0000 mL | Freq: Once | INTRAVENOUS | Status: AC
  Administered 2017-02-25: 1000 mL via INTRAVENOUS

## 2017-02-24 NOTE — ED Triage Notes (Signed)
Patient brought to ER from home by [facility] for c/o "needing behavioral eval". Patient is pleasant at stat desk and has been cooperative throughout per EMS. Per EMS, patient took her last Abilify yesterday, is getting refill tomorrow. Mother to desk stating she doesn't want patient to leave. Mother advised of policies on IVC (patient is currently on voluntary).
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
## 2017-02-24 NOTE — ED Provider Notes (Signed)
[facility] Emergency Department Provider Note   ____________________________________________   First MD Initiated Contact with Patient 02/24/17 2317     (approximate)  I have reviewed the triage vital signs and the nursing notes.   HISTORY  Chief Complaint Psychiatric Evaluation  history limited by intellectual disability  HPI Tamara [name] is a 46 y.o. female brought to the ED from home via EMS for behavioral medicine evaluation. Patienthas a history of schizophrenia who had a change to her medications approximately 2 weeks ago. Per EMS, mother reports patient has been psychotic since yesterday. Patient denies active SI/HI. Only tells me that she lives in [place] and wishes to go home. Voices no medical complaints. Specifically, denies fever, chills, chest pain, shortness breath, abdominal pain, nausea, vomiting, dysuria. Denies recent travel or trauma. Reports medication compliance.   Past Medical History:  Diagnosis Date  . GERD (gastroesophageal reflux disease)   . IUD (intrauterine device) in place IUD was placed in 2016  . Leiomyoma of uterus   . Schizophrenia ([place])   . Thyroid disease     Patient Active Problem List   Diagnosis Date Noted  . 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 GERD (gastroesophageal reflux disease) 09/11/2015  . Seborrheic dermatitis 09/11/2015  . 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 Schizophrenia ([place]) 09/10/2015  . 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 No pertinent surgical history.  Prior to Admission medications   Medication Sig Start Date End Date Taking? Authorizing Provider  ARIPiprazole (ABILIFY) 5 MG tablet Take 5 mg by mouth at bedtime.   Yes [provider]  ARIPiprazole ER (ABILIFY MAINTENA) 300 MG SRER Inject 1 each into the muscle every 30 (thirty) days.   Yes [provider]  benztropine (COGENTIN) 0.5 MG tablet Take 0.5 mg by mouth 2 (two)  times daily.   Yes [provider]  cetirizine (ZYRTEC) 10 MG tablet Take 1 tablet (10 mg total) by mouth daily as needed for allergies. 09/13/15  Yes Hildred Priest, MD  haloperidol decanoate (HALDOL DECANOATE) 100 MG/ML injection Inject 100 mg into the muscle every 28 (twenty-eight) days.   Yes [provider]  levothyroxine (SYNTHROID, LEVOTHROID) 88 MCG tablet Take 88 mcg by mouth daily before breakfast.   Yes [provider]  olanzapine zydis (ZYPREXA) 15 MG disintegrating tablet Take 15 mg by mouth at bedtime.   Yes [provider]  potassium chloride SA (K-DUR,KLOR-CON) 20 MEQ tablet Take 1 tablet (20 mEq total) by mouth at bedtime. 09/13/15  Yes Hildred Priest, MD  ranitidine (ZANTAC) 15 MG/ML syrup Take 4 mg/kg/day by mouth 2 (two) times daily.   Yes [provider]  Valproate Sodium (VALPROIC ACID) 250 MG/5ML SOLN Take 45 mLs by mouth at bedtime.   Yes [provider]  haloperidol (HALDOL) 0.5 MG tablet Take 0.5 mg by mouth 2 (two) times daily.    [provider]  hydrOXYzine (ATARAX/VISTARIL) 50 MG tablet Take 50 mg by mouth every 6 (six) hours as needed.    [provider]    Allergies Patient has no known allergies.  No family history on file.  Social History Social History  Substance Use Topics  . Smoking status: Never Smoker  . Smokeless tobacco: Never Used  . Alcohol use No    Review of Systems  Constitutional: No fever/chills. Eyes: No visual changes. ENT: No sore throat. Cardiovascular: Denies chest pain. Respiratory: Denies shortness of breath. Gastrointestinal: No abdominal pain.  No nausea, no  vomiting.  No diarrhea.  No constipation. Genitourinary: Negative for dysuria. Musculoskeletal: Negative for back pain. Skin: Negative for rash. Neurological: Negative for headaches, focal weakness or numbness. Psychiatric:positive for  psychosis.  ____________________________________________   PHYSICAL EXAM:  VITAL SIGNS: ED Triage Vitals  Enc Vitals Group     BP 02/24/17 2123 (!) 157/87     Pulse Rate 02/24/17 2123 (!) 112     Resp 02/24/17 2123 18     Temp 02/24/17 2123 99 F (37.2 C)     Temp Source 02/24/17 2123 Oral     SpO2 02/24/17 2123 100 %     Weight 02/24/17 2124 190 lb (86.2 kg)     Height 02/24/17 2124 5\' 2"  (1.575 m)     Head Circumference --      Peak Flow --      Pain Score --      Pain Loc --      Pain Edu? --      Excl. in [place]? --     Constitutional: Alert and oriented. Well appearing and in no acute distress. Eyes: Conjunctivae are normal. PERRL. EOMI. Head: Atraumatic. Nose: No congestion/rhinnorhea. Mouth/Throat: Mucous membranes are moist.  Oropharynx non-erythematous. Neck: No stridor.  No cervical spine tenderness to palpation. Cardiovascular: Normal rate, regular rhythm. Grossly normal heart sounds.  Good peripheral circulation. Respiratory: Normal respiratory effort.  No retractions. Lungs CTAB. Gastrointestinal: Soft and nontender. No distention. No abdominal bruits. No CVA tenderness. Musculoskeletal: No lower extremity tenderness nor edema.  No joint effusions. Neurologic:  Normal speech and language. No gross focal neurologic deficits are appreciated. No gait instability. Skin:  Skin is warm, dry and intact. No rash noted. Psychiatric: Mood and affect are flat. Speech and behavior are normal. Exhibiting flight of ideas.  ____________________________________________   LABS (all labs ordered are listed, but only abnormal results are displayed)  Labs Reviewed  COMPREHENSIVE METABOLIC PANEL - Abnormal; Notable for the following:       Result Value   Sodium 127 (*)    Potassium 3.2 (*)    Chloride 89 (*)    Glucose, Bld 110 (*)    BUN <5 (*)    Total Protein 8.8 (*)    All other components within normal limits  ACETAMINOPHEN LEVEL - Abnormal; Notable for the following:     Acetaminophen (Tylenol), Serum <10 (*)    All other components within normal limits  ETHANOL  SALICYLATE LEVEL  CBC  URINE DRUG SCREEN, QUALITATIVE ([facility] ONLY)  POC URINE PREG, ED  POCT PREGNANCY, URINE   ____________________________________________  EKG  None ____________________________________________  RADIOLOGY  No results found.  ____________________________________________   PROCEDURES  Procedure(s) performed: None  Procedures  Critical Care performed: No  ____________________________________________   INITIAL IMPRESSION / ASSESSMENT AND PLAN / ED COURSE  Pertinent labs & imaging results that were available during my care of the patient were reviewed by me and considered in my medical decision making (see chart for details).  46 year old female with a history of schizophrenia and intellectual disability brought for behavioral medicine evaluation. Laboratory and urine results demonstrate mild hyponatremia compared to one year ago. Also with mild hypokalemia. Will administer 1 L normal saline, consult TTS and [facility] psychiatry. Patient contracts for safety in the emergency department. Will continue voluntary status pending psychiatric disposition.  Clinical Course as of Feb 26 339  Wed Feb 25, 2017  0246 Patient was evaluated by [facility] psychiatrist Dr. Allena Napoleon who deems patient is psychiatrically stable for discharge  home with her mother. Patient was in the ED under voluntary status. Mother will transport her home. Strict return precautions given. Patient verbalizes understanding and agrees with plan of care.  [JS]    Clinical Course User Index [JS] Paulette Blanch, MD     ____________________________________________   FINAL CLINICAL IMPRESSION(S) / ED DIAGNOSES  Final diagnoses:  Schizophrenia, unspecified type ([place])  Hyponatremia  Hypokalemia      NEW MEDICATIONS STARTED DURING THIS VISIT:  New Prescriptions   No medications on file     Note:   This document was prepared using Dragon voice recognition software and may include unintentional dictation errors.    Paulette Blanch, MD 02/25/17 [phone]

## 2017-02-24 NOTE — ED Triage Notes (Signed)
Patient has a history of schizophrenia. Patient had a change to her medications about 2 weeks ago. Mother reports that the patient has been having a mental break since Monday. Patient cooperative with flight of ideas in triage.

## 2017-02-25 NOTE — ED Notes (Signed)
Report called to [facility] in [place] and pt ambulatory to [facility] without difficulty or distress noted

## 2017-02-25 NOTE — ED Notes (Signed)
Resting quietly on stretcher with eyes closed; resp even/unlab with no distress noted; IVFs cont

## 2017-02-25 NOTE — Progress Notes (Signed)
Pt is alert and oriented on admission. Pt mood is appropriate and her affect is bright. Writer oriented pt to the milieu and encouraged rest. Pt went to bed upon arrival. 15 minute checks are ongoing for safety

## 2017-02-25 NOTE — Progress Notes (Signed)
Writer spoke with [facility] who is recommending d/c. Writer called pt's mother, but she did not answer the phone. Left message to contact [facility].

## 2017-02-25 NOTE — ED Notes (Signed)
SOC in progress.  

## 2017-02-25 NOTE — ED Notes (Signed)
BEHAVIORAL HEALTH ROUNDING Patient sleeping: No. Patient alert and oriented: yes Behavior appropriate: Yes.  ; If no, describe:  Nutrition and fluids offered: yes Toileting and hygiene offered: Yes  Sitter present: q15 minute observations and security camera monitoring Law enforcement present: Yes  ODS  
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
## 2017-02-25 NOTE — ED Notes (Signed)
TTS in progress 
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
## 2017-02-25 NOTE — Discharge Instructions (Signed)
Continue your medicines as directed by your doctor.  Return to the ER for worsening symptoms, feelings of hurting yourself or others, or other concerns. °

## 2017-02-25 NOTE — ED Notes (Signed)
Patient alert and stable for discharge. Discharge AVS reviewed with patient and she verbalized understanding. Patient given written copy of AVS will also review with mom. Patient denies SI/Hi and A/V/H. Patient denies pain. All belongings returned to patient. Patient escorted to lobby by this Probation officer to meet mom.

## 2017-02-25 NOTE — BH Assessment (Signed)
TTS consult has been completed

## 2017-06-30 DIAGNOSIS — R634 Abnormal weight loss: Secondary | ICD-10-CM | POA: Insufficient documentation

## 2018-08-06 ENCOUNTER — Other Ambulatory Visit: Payer: Self-pay

## 2018-08-06 ENCOUNTER — Emergency Department
Admission: EM | Admit: 2018-08-06 | Discharge: 2018-08-08 | Disposition: A | Discharge status: Home / Self Care | Payer: Medicaid Other | Attending: Emergency Medicine | Admitting: Emergency Medicine

## 2018-08-06 DIAGNOSIS — F200 Paranoid schizophrenia: Secondary | ICD-10-CM | POA: Diagnosis present

## 2018-08-06 DIAGNOSIS — F458 Other somatoform disorders: Secondary | ICD-10-CM | POA: Insufficient documentation

## 2018-08-06 DIAGNOSIS — E031 Congenital hypothyroidism without goiter: Secondary | ICD-10-CM | POA: Insufficient documentation

## 2018-08-06 DIAGNOSIS — F209 Schizophrenia, unspecified: Secondary | ICD-10-CM | POA: Diagnosis present

## 2018-08-06 DIAGNOSIS — K219 Gastro-esophageal reflux disease without esophagitis: Secondary | ICD-10-CM | POA: Insufficient documentation

## 2018-08-06 DIAGNOSIS — Z79899 Other long term (current) drug therapy: Secondary | ICD-10-CM | POA: Diagnosis not present

## 2018-08-06 DIAGNOSIS — F70 Mild intellectual disabilities: Secondary | ICD-10-CM | POA: Insufficient documentation

## 2018-08-06 DIAGNOSIS — F23 Brief psychotic disorder: Secondary | ICD-10-CM

## 2018-08-06 DIAGNOSIS — R631 Polydipsia: Secondary | ICD-10-CM

## 2018-08-06 DIAGNOSIS — F29 Unspecified psychosis not due to a substance or known physiological condition: Secondary | ICD-10-CM | POA: Insufficient documentation

## 2018-08-06 DIAGNOSIS — E871 Hypo-osmolality and hyponatremia: Secondary | ICD-10-CM

## 2018-08-06 DIAGNOSIS — F54 Psychological and behavioral factors associated with disorders or diseases classified elsewhere: Secondary | ICD-10-CM | POA: Diagnosis present

## 2018-08-06 LAB — COMPREHENSIVE METABOLIC PANEL
ALK PHOS: 62 U/L (ref 38–126)
ALT: 9 U/L (ref 0–44)
AST: 28 U/L (ref 15–41)
Albumin: 3.7 g/dL (ref 3.5–5.0)
Anion gap: 14 (ref 5–15)
BILIRUBIN TOTAL: 0.4 mg/dL (ref 0.3–1.2)
BUN: <5 mg/dL — ABNORMAL LOW (ref 6–20)
CALCIUM: 8.8 mg/dL — AB (ref 8.9–10.3)
CO2: 25 mmol/L (ref 22–32)
CREATININE: 0.59 mg/dL (ref 0.44–1.00)
Chloride: 85 mmol/L — ABNORMAL LOW (ref 98–111)
GFR calc Af Amer: >60 mL/min (ref >60)
Glucose, Bld: 105 mg/dL — ABNORMAL HIGH (ref 70–99)
Potassium: 3.1 mmol/L — ABNORMAL LOW (ref 3.5–5.1)
Sodium: 124 mmol/L — ABNORMAL LOW (ref 135–145)
TOTAL PROTEIN: 7.7 g/dL (ref 6.5–8.1)

## 2018-08-06 LAB — CBC
HCT: 35.1 % — ABNORMAL LOW (ref 36.0–46.0)
Hemoglobin: 12.6 g/dL (ref 12.0–15.0)
MCH: 31.8 pg (ref 26.0–34.0)
MCHC: 35.9 g/dL (ref 30.0–36.0)
MCV: 88.6 fL (ref 80.0–100.0)
PLATELETS: 254 10*3/uL (ref 150–400)
RBC: 3.96 MIL/uL (ref 3.87–5.11)
RDW: 12.2 % (ref 11.5–15.5)
WBC: 11.9 10*3/uL — ABNORMAL HIGH (ref 4.0–10.5)
nRBC: 0 % (ref 0.0–0.2)

## 2018-08-06 LAB — ETHANOL

## 2018-08-06 LAB — ACETAMINOPHEN LEVEL: Acetaminophen (Tylenol), Serum: <10 ug/mL — ABNORMAL LOW (ref 10–30)

## 2018-08-06 LAB — SALICYLATE LEVEL: Salicylate Lvl: <7 mg/dL (ref 2.8–30.0)

## 2018-08-06 MED ORDER — HALOPERIDOL LACTATE 5 MG/ML IJ SOLN
INTRAMUSCULAR | Status: AC
  Filled 2018-08-06: qty 1

## 2018-08-06 MED ORDER — DIPHENHYDRAMINE HCL 50 MG/ML IJ SOLN
INTRAMUSCULAR | Status: AC
  Filled 2018-08-06: qty 1

## 2018-08-06 MED ORDER — LORAZEPAM 2 MG/ML IJ SOLN
INTRAMUSCULAR | Status: AC
  Filled 2018-08-06: qty 1

## 2018-08-06 MED ORDER — DIPHENHYDRAMINE HCL 50 MG/ML IJ SOLN
50.0000 mg | Freq: Once | INTRAMUSCULAR | Status: AC
  Administered 2018-08-06: 50 mg via INTRAMUSCULAR

## 2018-08-06 MED ORDER — HALOPERIDOL LACTATE 5 MG/ML IJ SOLN
5.0000 mg | Freq: Once | INTRAMUSCULAR | Status: AC
  Administered 2018-08-06: 5 mg via INTRAMUSCULAR

## 2018-08-06 MED ORDER — LORAZEPAM 2 MG/ML IJ SOLN
2.0000 mg | Freq: Once | INTRAMUSCULAR | Status: AC
  Administered 2018-08-06: 2 mg via INTRAMUSCULAR

## 2018-08-06 NOTE — ED Notes (Signed)
Patient has IVC paperwork that states she is threatening to kill her mother. Patient is speaking in incomprehensible sentences.

## 2018-08-06 NOTE — ED Notes (Signed)
Pt agitated, yelling, pacing room. Pt refuses to leave triage room. MD to triage room, order for haldol 34mIM, benadryl 50mg IM, ativan 2mg IM received.
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
## 2018-08-06 NOTE — ED Notes (Signed)
Pt belongings obtained by this tech and RN April, the following belongings collected are as follows...Marland KitchenMarland KitchenMarland Kitchen   White hat, Purple coat, Black pants,  Black underwear, [employer]
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
## 2018-08-06 NOTE — ED Notes (Signed)
Pt will not cooperate for blood draw.
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
## 2018-08-07 DIAGNOSIS — F23 Brief psychotic disorder: Secondary | ICD-10-CM

## 2018-08-07 DIAGNOSIS — F201 Disorganized schizophrenia: Secondary | ICD-10-CM

## 2018-08-07 LAB — BASIC METABOLIC PANEL
Anion gap: 10 (ref 5–15)
CO2: 26 mmol/L (ref 22–32)
Calcium: 8.7 mg/dL — ABNORMAL LOW (ref 8.9–10.3)
Chloride: 97 mmol/L — ABNORMAL LOW (ref 98–111)
Creatinine, Ser: 0.62 mg/dL (ref 0.44–1.00)
GFR calc Af Amer: >60 mL/min (ref >60)
GFR calc non Af Amer: >60 mL/min (ref >60)
Glucose, Bld: 99 mg/dL (ref 70–99)
Potassium: 3.3 mmol/L — ABNORMAL LOW (ref 3.5–5.1)
Sodium: 133 mmol/L — ABNORMAL LOW (ref 135–145)

## 2018-08-07 LAB — URINE DRUG SCREEN, QUALITATIVE (ARMC ONLY)
AMPHETAMINES, UR SCREEN: NOT DETECTED
Barbiturates, Ur Screen: NOT DETECTED
Benzodiazepine, Ur Scrn: NOT DETECTED
COCAINE METABOLITE, UR ~~LOC~~: NOT DETECTED
Cannabinoid 50 Ng, Ur ~~LOC~~: NOT DETECTED
MDMA (ECSTASY) UR SCREEN: NOT DETECTED
Methadone Scn, Ur: NOT DETECTED
Opiate, Ur Screen: NOT DETECTED
Phencyclidine (PCP) Ur S: NOT DETECTED
TRICYCLIC, UR SCREEN: NOT DETECTED

## 2018-08-07 MED ORDER — FAMOTIDINE 20 MG PO TABS
20.0000 mg | ORAL_TABLET | Freq: Every day | ORAL | Status: DC
  Administered 2018-08-07 – 2018-08-08 (×2): 20 mg via ORAL
  Filled 2018-08-07 (×2): qty 1

## 2018-08-07 MED ORDER — OLANZAPINE 5 MG PO TABS: 5.0000 mg | ORAL_TABLET | ORAL | Status: DC | PRN

## 2018-08-07 MED ORDER — ARIPIPRAZOLE 10 MG PO TABS
10.0000 mg | ORAL_TABLET | Freq: Every day | ORAL | Status: DC
  Administered 2018-08-07: 10 mg via ORAL
  Filled 2018-08-07 (×2): qty 1

## 2018-08-07 MED ORDER — SODIUM CHLORIDE 0.9 % IV BOLUS
1000.0000 mL | Freq: Once | INTRAVENOUS | Status: AC
  Administered 2018-08-07: 1000 mL via INTRAVENOUS

## 2018-08-07 MED ORDER — HALOPERIDOL LACTATE 5 MG/ML IJ SOLN: 5.0000 mg | Freq: Four times a day (QID) | INTRAMUSCULAR | Status: DC | PRN

## 2018-08-07 MED ORDER — LORATADINE 10 MG PO TABS
10.0000 mg | ORAL_TABLET | Freq: Every day | ORAL | Status: DC
  Administered 2018-08-07 – 2018-08-08 (×2): 10 mg via ORAL
  Filled 2018-08-07 (×2): qty 1

## 2018-08-07 MED ORDER — DIPHENHYDRAMINE HCL 50 MG/ML IJ SOLN: 50.0000 mg | Freq: Four times a day (QID) | INTRAMUSCULAR | Status: DC | PRN

## 2018-08-07 MED ORDER — LEVOTHYROXINE SODIUM 88 MCG PO TABS
88.0000 ug | ORAL_TABLET | Freq: Every day | ORAL | Status: DC
  Administered 2018-08-08: 88 ug via ORAL
  Filled 2018-08-07 (×2): qty 1

## 2018-08-07 MED ORDER — POTASSIUM CHLORIDE CRYS ER 20 MEQ PO TBCR
40.0000 meq | EXTENDED_RELEASE_TABLET | Freq: Once | ORAL | Status: AC
  Administered 2018-08-07: 40 meq via ORAL
  Filled 2018-08-07: qty 2

## 2018-08-07 MED ORDER — DIVALPROEX SODIUM 125 MG PO CSDR
250.0000 mg | DELAYED_RELEASE_CAPSULE | Freq: Three times a day (TID) | ORAL | Status: DC
  Administered 2018-08-07 – 2018-08-08 (×2): 250 mg via ORAL
  Filled 2018-08-07 (×5): qty 2

## 2018-08-07 MED ORDER — TRAZODONE HCL 100 MG PO TABS
100.0000 mg | ORAL_TABLET | Freq: Every evening | ORAL | Status: DC | PRN
  Administered 2018-08-07: 100 mg via ORAL
  Filled 2018-08-07: qty 1

## 2018-08-07 MED ORDER — BENZTROPINE MESYLATE 1 MG PO TABS
1.0000 mg | ORAL_TABLET | Freq: Two times a day (BID) | ORAL | Status: DC
  Administered 2018-08-07 – 2018-08-08 (×2): 1 mg via ORAL
  Filled 2018-08-07 (×2): qty 1

## 2018-08-07 MED ORDER — LORAZEPAM 2 MG/ML IJ SOLN: 2.0000 mg | Freq: Four times a day (QID) | INTRAMUSCULAR | Status: DC | PRN

## 2018-08-07 NOTE — ED Notes (Signed)
Gave patient [place] snack tray and diet gingrale.

## 2018-08-07 NOTE — ED Notes (Signed)
Snack and beverage given. 

## 2018-08-07 NOTE — Consult Note (Addendum)
[facility] Consult   Reason for Consult: Schizophrenia with agitation increased delusions Referring Physician:  ED Patient Identification: Tamara [name] MRN:  [id number] Principal Diagnosis: Schizophrenia ([place]) Diagnosis:  Principal Problem:   Schizophrenia ([place]) Active Problems:   GERD (gastroesophageal reflux disease)   Mild intellectual disability   Congenital hypothyroidism without goiter   Psychogenic polydipsia   Total Time spent with patient: 1 hour  Subjective/HPI:   Tamara [name] is a 48 y.o. female patient with history of schizophrenia who arrived under involuntary commitment from [place].  The patient is disorganized speech I suspect she has mild IDD.  She presents with paranoia that she is being raped and that someone is trying to be killed possibly she is one being killed. Speaking loudly about her mother giving her further drugs and making her father pregnant.  She has been agitated yelling and pacing.  She has been given Haldol 5 mg Benadryl 50 mg Ativan 2 mg IM with some benefit.  She was in handcuffs initially.  She is not able to provide any coherent history.  She is oriented to her first name and states we are "close to the hospital."  She is not able to guess any of the date.  Past Psychiatric History: Schizophrenia she has been admitted to the [facility] inpatient psychiatric service in 2017.  She was discharged on amantadine 100 mg nightly Haldol decanoate 100 mg IM monthly levothyroxine 75 mcg daily Zyprexa Zydis 15 mg nightly valproic acid 2225 mg nightly liquid. The most recent admission I see is to [facility] healthcare 05/11/2017 through 05/29/2017 and she was then discharged to [facility].  At that point she was Cogentin 1 mg twice daily Depakote ER 1000 mg nightly Haldol 5 mg twice daily Synthroid at 63 mcg dailyProlixin 2.5 mg twice daily Abilify maintain at 300 mg IM monthly Seroquel 50 mg nightly.  Risk to Self:  Mod unable to provide  self-care needs 24-hour support and supervision Risk to Others:  agitated but no evidence intentionally trying to harm others Prior Inpatient Therapy:  Yes Prior Outpatient Therapy:  Yes  Past Medical History:  Past Medical History:  Diagnosis Date  . GERD (gastroesophageal reflux disease)   . IUD (intrauterine device) in place IUD was placed in 2016  . Leiomyoma of uterus   . Schizophrenia ([place])   . Thyroid disease    No past surgical history on file. Family History: No family history on file.  Social History:  Social History   Substance and Sexual Activity  Alcohol Use No     Social History   Substance and Sexual Activity  Drug Use No    Social History   Socioeconomic History  . Marital status: Single    Spouse name: Not on file  . Number of children: Not on file  . Years of education: Not on file  . Highest education level: Not on file  Occupational History  . Not on file  Social Needs  . Financial resource strain: Not on file  . Food insecurity:    Worry: Not on file    Inability: Not on file  . Transportation needs:    Medical: Not on file    Non-medical: Not on file  Tobacco Use  . Smoking status: Never Smoker  . Smokeless tobacco: Never Used  Substance and Sexual Activity  . Alcohol use: No  . Drug use: No  . Sexual activity: Yes    Birth control/protection: Surgical  Lifestyle  .  Physical activity:    Days per week: Not on file    Minutes per session: Not on file  . Stress: Not on file  Relationships  . Social connections:    Talks on phone: Not on file    Gets together: Not on file    Attends religious service: Not on file    Active member of club or organization: Not on file    Attends meetings of clubs or organizations: Not on file    Relationship status: Not on file  Other Topics Concern  . Not on file  Social History Narrative  . Not on file   Additional Social History:    Allergies:  No Known Allergies  Labs:  Results for  orders placed or performed during the hospital encounter of 08/06/18 (from the past 48 hour(s))  Comprehensive metabolic panel     Status: Abnormal   Collection Time: 08/06/18  9:37 PM  Result Value Ref Range   Sodium 124 (L) 135 - 145 mmol/L   Potassium 3.1 (L) 3.5 - 5.1 mmol/L   Chloride 85 (L) 98 - 111 mmol/L   CO2 25 22 - 32 mmol/L   Glucose, Bld 105 (H) 70 - 99 mg/dL   BUN <5 (L) 6 - 20 mg/dL   Creatinine, Ser 0.59 0.44 - 1.00 mg/dL   Calcium 8.8 (L) 8.9 - 10.3 mg/dL   Total Protein 7.7 6.5 - 8.1 g/dL   Albumin 3.7 3.5 - 5.0 g/dL   AST 28 15 - 41 U/L   ALT 9 0 - 44 U/L   Alkaline Phosphatase 62 38 - 126 U/L   Total Bilirubin 0.4 0.3 - 1.2 mg/dL   GFR calc non Af Amer >60 >60 mL/min   GFR calc Af Amer >60 >60 mL/min   Anion gap 14 5 - 15    Comment: Performed at [facility], [address] [place], [place] [address]  Ethanol     Status: None   Collection Time: 08/06/18  9:37 PM  Result Value Ref Range   Alcohol, Ethyl (B) <10 <10 mg/dL    Comment: (NOTE) Lowest detectable limit for serum alcohol is 10 mg/dL. For medical purposes only. Performed at [facility], [address] [place], [place] [address]   Salicylate level     Status: None   Collection Time: 08/06/18  9:37 PM  Result Value Ref Range   Salicylate Lvl <0.6 2.8 - 30.0 mg/dL    Comment: Performed at [facility], [place], [place] [address]  Acetaminophen level     Status: Abnormal   Collection Time: 08/06/18  9:37 PM  Result Value Ref Range   Acetaminophen (Tylenol), Serum <10 (L) 10 - 30 ug/mL    Comment: (NOTE) Therapeutic concentrations vary significantly. A range of 10-30 ug/mL  may be an effective concentration for many patients. However, some  are best treated at concentrations outside of this range. Acetaminophen concentrations >150 ug/mL at 4 hours after ingestion  and >50 ug/mL at 12 hours after ingestion are often associated with  toxic  reactions. Performed at [facility], [address] [place], [place] [address]   cbc     Status: Abnormal   Collection Time: 08/06/18  9:37 PM  Result Value Ref Range   WBC 11.9 (H) 4.0 - 10.5 K/uL   RBC 3.96 3.87 - 5.11 MIL/uL   Hemoglobin 12.6 12.0 - 15.0 g/dL   HCT 35.1 (L) 36.0 - 46.0 %  MCV 88.6 80.0 - 100.0 fL   MCH 31.8 26.0 - 34.0 pg   MCHC 35.9 30.0 - 36.0 g/dL   RDW 12.2 11.5 - 15.5 %   Platelets 254 150 - 400 K/uL   nRBC 0.0 0.0 - 0.2 %    Comment: Performed at [facility], [address] [place], [place] [address]  Urine Drug Screen, Qualitative     Status: None   Collection Time: 08/06/18  9:37 PM  Result Value Ref Range   Tricyclic, Ur Screen NONE DETECTED NONE DETECTED   Amphetamines, Ur Screen NONE DETECTED NONE DETECTED   MDMA (Ecstasy)Ur Screen NONE DETECTED NONE DETECTED   Cocaine Metabolite,Ur [place] NONE DETECTED NONE DETECTED   Opiate, Ur Screen NONE DETECTED NONE DETECTED   Phencyclidine (PCP) Ur S NONE DETECTED NONE DETECTED   Cannabinoid 50 Ng, Ur [place] NONE DETECTED NONE DETECTED   Barbiturates, Ur Screen NONE DETECTED NONE DETECTED   Benzodiazepine, Ur Scrn NONE DETECTED NONE DETECTED   Methadone Scn, Ur NONE DETECTED NONE DETECTED    Comment: (NOTE) Tricyclics + metabolites, urine    Cutoff 1000 ng/mL Amphetamines + metabolites, urine  Cutoff 1000 ng/mL MDMA (Ecstasy), urine              Cutoff 500 ng/mL Cocaine Metabolite, urine          Cutoff 300 ng/mL Opiate + metabolites, urine        Cutoff 300 ng/mL Phencyclidine (PCP), urine         Cutoff 25 ng/mL Cannabinoid, urine                 Cutoff 50 ng/mL Barbiturates + metabolites, urine  Cutoff 200 ng/mL Benzodiazepine, urine              Cutoff 200 ng/mL Methadone, urine                   Cutoff 300 ng/mL The urine drug screen provides only a preliminary, unconfirmed analytical test result and should not be used for non-medical purposes. Clinical consideration and  professional judgment should be applied to any positive drug screen result due to possible interfering substances. A more specific alternate chemical method must be used in order to obtain a confirmed analytical result. Gas chromatography / mass spectrometry (GC/MS) is the preferred confirmat ory method. Performed at [facility], [address] [place], [place] [address]   Basic metabolic panel     Status: Abnormal   Collection Time: 08/07/18  4:32 AM  Result Value Ref Range   Sodium 133 (L) 135 - 145 mmol/L   Potassium 3.3 (L) 3.5 - 5.1 mmol/L   Chloride 97 (L) 98 - 111 mmol/L   CO2 26 22 - 32 mmol/L   Glucose, Bld 99 70 - 99 mg/dL   BUN <5 (L) 6 - 20 mg/dL   Creatinine, Ser 0.62 0.44 - 1.00 mg/dL   Calcium 8.7 (L) 8.9 - 10.3 mg/dL   GFR calc non Af Amer >60 >60 mL/min   GFR calc Af Amer >60 >60 mL/min   Anion gap 10 5 - 15    Comment: Performed at [facility], [address] [place], [place] [address]    No current facility-administered medications for this encounter.    Current Outpatient Medications  Medication Sig Dispense Refill  . ARIPiprazole (ABILIFY) 5 MG tablet Take 5 mg by mouth at bedtime.    . benztropine (COGENTIN) 1 MG tablet Take 1 mg  by mouth 2 (two) times daily.    . cetirizine (ZYRTEC) 10 MG tablet Take 1 tablet (10 mg total) by mouth daily as needed for allergies.    [place] divalproex (DEPAKOTE ER) 250 MG 24 hr tablet Take 375 mg by mouth at bedtime.     . divalproex (DEPAKOTE ER) 500 MG 24 hr tablet Take 750 mg by mouth at bedtime.     . haloperidol decanoate (HALDOL DECANOATE) 100 MG/ML injection Inject 100 mg into the muscle every 28 (twenty-eight) days.    [place] Kitchen levothyroxine (SYNTHROID, LEVOTHROID) 88 MCG tablet Take 88 mcg by mouth daily before breakfast.    . ranitidine (ZANTAC) 150 MG tablet Take 150 mg by mouth 2 (two) times daily.    . Valproate Sodium (VALPROIC ACID) 250 MG/5ML SOLN Take 250-500 mg by mouth See admin instructions.  Take 9ml (250mg ) by mouth every morning and 20ml (500mg ) by mouth every night at bedtime      Musculoskeletal: Strength & Muscle Tone: within normal limits Gait & Station: normal Patient leans: N/A  Psychiatric Specialty Exam: Physical Exam  Constitutional: She appears well-developed and well-nourished.  HENT:  Head: Normocephalic and atraumatic.  Eyes: Pupils are equal, round, and reactive to light. EOM are normal.  Neck: Normal range of motion. Neck supple.  Cardiovascular: Normal rate and regular rhythm.  Respiratory: Effort normal and breath sounds normal.  Musculoskeletal: Normal range of motion.  Neurological: She is alert.  Skin: Skin is warm and dry.    ROS  Blood pressure 113/62, pulse 75, temperature (!) 97.4 F (36.3 C), temperature source Axillary, resp. rate 18, height 5\' 2"  (1.575 m), weight 81.6 kg, SpO2 100 %.Body mass index is 32.92 kg/m.  General Appearance: Fairly Groomed  Eye Contact:  Good  Speech:  Poorly enunciated  Volume:  Increased  Mood:  Anxious and Dysphoric  Affect:  Congruent  Thought Process:  Disorganized  Orientation:  Other:  First name and setting  Thought Content:  Illogical and Delusions  Suicidal Thoughts:  No  Homicidal Thoughts:  No  Memory:  Immediate;   Poor Recent;   Poor Remote;   Poor  Judgement:  Impaired  Insight:  Lacking  Psychomotor Activity:  Normal  Concentration:  Concentration: Poor and Attention Span: Poor  Recall:  Poor  Fund of Knowledge:  Poor  Language:  Fair  Akathisia:  No  Handed:  Right  AIMS (if indicated):     Assets:  Housing Social Support  ADL's:  Intact  Cognition:  Impaired,  Mild  Sleep:        Treatment Plan Summary: Daily contact with patient to assess and evaluate symptoms and progress in treatment, Medication management and Plan inpatient psychiatric admission titrate Abilify to 10 mg nightly.  She has been on Haldol Decanoate injections need to determine most recent dose.  Continue  Cogentin.  She talks about a large tablet that she can swallow in her medication list includes both Depakote ER tablets and liquid.  For now we will change to Depakote sprinkle 250 mg 3 times daily and determine whether or not this medication is actually been helpful for her or not.  Cogentin.  Haldol Benadryl Ativan as needed, that was helpful when she initially presented to the emergency department Zyprexa p.o. as needed agitation.  Monitor overnight consider inpatient admission here or elsewhere.  Sodium 133 potassium 3.3, potassium supplementation now recheck BMP in the morning also check valproic acid TSH lipid panel hemoglobin A1c in the morning.  Disposition: Recommend psychiatric Inpatient admission when medically cleared.  Patience Musca, MD 08/07/2018 5:38 PM

## 2018-08-07 NOTE — ED Notes (Signed)
Pt up to bathroom.

## 2018-08-07 NOTE — ED Notes (Signed)
Verbal order from Dr Cinda Quest for Ativan 2 mg IM and Haldol 10 mg IM for pt coming back from triage yelling and screaming.

## 2018-08-07 NOTE — ED Provider Notes (Signed)
-----------------------------------------   1:50 AM on 08/07/2018 -----------------------------------------  Reviewed patient's labs and noted hyponatremia.  Will infuse 1 L normal saline and repeat BMP.   ----------------------------------------- 5:16 AM on 08/07/2018 -----------------------------------------  Repeat sodium has increased to 133.   Paulette Blanch, MD 08/07/18 [phone]

## 2018-08-07 NOTE — ED Notes (Signed)
Pt given a food tray and a drink. Pt offered hand sanitizer.

## 2018-08-07 NOTE — ED Notes (Signed)
Pt. Calm and cooperative.  Pt. Able to ambulate by herself to bathroom and back.

## 2018-08-07 NOTE — BH Assessment (Signed)
Assessment Note  Tamara [name] is an 48 y.o. female. Pt arrives with [employer] officer with IVC papers in place from [place]. Pt is psychotic, will not answer questions, rapidly speaking about her mother giving her father drugs and making her father pregnant. Pt brought back to Rm 21 from triage at this time.   Pt agitated, yelling, pacing room. Pt refuses to leave triage room. MD to triage room, order for haldol 85mIM, benadryl 50mg IM, ativan 2mg IM received.   Pt remains with [employer] officer [name] in [place]. Pt was brought to ED from [facility] by officer under IVC. Pt is talking in rapid pressured speech about being raped and trying to kill someone. Pt did follow commands and got on the bed. Handcuffs removed after she was on the bed. [employer] officer remains at the bedside for assistance at this time.  Pt alert to self. Ambulatory without difficulty.   Diagnosis: Schizophrenia  Past Medical History:  Past Medical History:  Diagnosis Date  . GERD (gastroesophageal reflux disease)   . IUD (intrauterine device) in place IUD was placed in 2016  . Leiomyoma of uterus   . Schizophrenia ([place])   . Thyroid disease     No past surgical history on file.  Family History: No family history on file.  Social History:  reports that she has never smoked. She has never used smokeless tobacco. She reports that she does not drink alcohol or use drugs.  Additional Social History:     CIWA: CIWA-Ar BP: 137/80 Pulse Rate: 96 COWS:    Allergies: No Known Allergies  Home Medications: (Not in a hospital admission)   OB/GYN Status:  No LMP recorded (lmp unknown). (Menstrual status: Other).  General Assessment Data Assessment unable to be completed: Yes Reason for not completing assessment: Pt arrives with [employer] officer with IVC papers in place from [place]. Pt is psychotic, will not answer questions, rapidly speaking about her mother giving her father drugs and making her father pregnant.                                                              Disposition:     On Site Evaluation by:   Reviewed with Physician:    Tamara [name] D Nissi Doffing 08/07/2018 1:52 AM

## 2018-08-07 NOTE — ED Notes (Signed)
Hourly rounding reveals patient in room. No complaints, stable, in no acute distress. Q15 minute rounds and monitoring via Security Cameras to continue. 

## 2018-08-07 NOTE — ED Notes (Signed)
Pt. Transferred to [place] from ED to room after screening for contraband. Report to include Situation, Background, Assessment and Recommendations from [facility]. Pt. Oriented to unit including Q15 minute rounds as well as the security cameras for their protection. Patient is alert and oriented, warm and dry in no acute distress. Patient denies SI, HI, and appears to be having AVH. Pt. Encouraged to let me know if needs arise.

## 2018-08-08 MED ORDER — ARIPIPRAZOLE 10 MG PO TABS: 10.0000 mg | ORAL_TABLET | Freq: Every day | ORAL | 1 refills | Status: DC

## 2018-08-08 NOTE — ED Notes (Signed)
Hourly rounding reveals patient in room. No complaints, stable, in no acute distress. Q15 minute rounds and monitoring via Security Cameras to continue. 

## 2018-08-08 NOTE — ED Notes (Signed)
Patient voiced understanding of discharge, and her sister voiced understanding of discharge instructions, prescription given to sister for patient, all belongings given back to Patient, no signs of distress.

## 2018-08-08 NOTE — Consult Note (Signed)
[facility] Consult   Reason for Consult: Schizophrenia with agitation increased delusions Referring Physician:  ED Patient Identification: Tamara [name] MRN:  [id number] Principal Diagnosis: Schizophrenia ([place]) Diagnosis:  Principal Problem:   Schizophrenia ([place]) Active Problems:   GERD (gastroesophageal reflux disease)   Mild intellectual disability   Congenital hypothyroidism without goiter   Psychogenic polydipsia   Total Time spent with patient: 30 minutes   08/08/2018 update: Very pleasant overnight, cooperative with nurses. This AM she is calm, enjoys chatting, likes her medication, likes the Depakote sprinkle capsule states she can swallow it OK. I called her mother, some confusion at home about the medications, she read the labels and didn't realize Depakote, liquid valproate and tab are 3 forms of same medication, she has been breaking up the Depakote and giving her that as well. For now stay on the liquid only and bring all medications to next psychiatry appointment and I let her know there is also a capsule formulation, which can also be sprinkled, if has problems taking other forms. Mother and father state they are ready to manage her at home, agree she is doing well this AM. Discharge home. Discharge with Rx for Abilify 10mg  qhs.  Subjective/HPI:   Tamara [name] is a 48 y.o. female patient with history of schizophrenia who arrived under involuntary commitment from [facility].  The patient is disorganized speech I suspect she has mild IDD.  She presents with paranoia that she is being raped and that someone is trying to be killed possibly she is one being killed. Speaking loudly about her mother giving her further drugs and making her father pregnant.  She has been agitated yelling and pacing.  She has been given Haldol 5 mg Benadryl 50 mg Ativan 2 mg IM with some benefit.  She was in handcuffs initially.  She is not able to provide any coherent history.  She is  oriented to her first name and states we are "close to the hospital."  She is not able to guess any of the date.  Past Psychiatric History: Schizophrenia she has been admitted to the [facility] inpatient psychiatric service in 2017.  She was discharged on amantadine 100 mg nightly Haldol decanoate 100 mg IM monthly levothyroxine 75 mcg daily Zyprexa Zydis 15 mg nightly valproic acid 2225 mg nightly liquid. The most recent admission I see is to [facility] healthcare 05/11/2017 through 05/29/2017 and she was then discharged to [facility].  At that point she was Cogentin 1 mg twice daily Depakote ER 1000 mg nightly Haldol 5 mg twice daily Synthroid at 63 mcg dailyProlixin 2.5 mg twice daily Abilify maintain at 300 mg IM monthly Seroquel 50 mg nightly.  Risk to Self:  Mod unable to provide self-care needs 24-hour support and supervision Risk to Others:  agitated but no evidence intentionally trying to harm others Prior Inpatient Therapy:  Yes Prior Outpatient Therapy:  Yes  Past Medical History:  Past Medical History:  Diagnosis Date  . GERD (gastroesophageal reflux disease)   . IUD (intrauterine device) in place IUD was placed in 2016  . Leiomyoma of uterus   . Schizophrenia ([place])   . Thyroid disease    No past surgical history on file. Family History: No family history on file.  Social History:  Social History   Substance and Sexual Activity  Alcohol Use No     Social History   Substance and Sexual Activity  Drug Use No    Social History  Socioeconomic History  . Marital status: Single    Spouse name: Not on file  . Number of children: Not on file  . Years of education: Not on file  . Highest education level: Not on file  Occupational History  . Not on file  Social Needs  . Financial resource strain: Not on file  . Food insecurity:    Worry: Not on file    Inability: Not on file  . Transportation needs:    Medical: Not on file    Non-medical: Not on file  Tobacco  Use  . Smoking status: Never Smoker  . Smokeless tobacco: Never Used  Substance and Sexual Activity  . Alcohol use: No  . Drug use: No  . Sexual activity: Yes    Birth control/protection: Surgical  Lifestyle  . Physical activity:    Days per week: Not on file    Minutes per session: Not on file  . Stress: Not on file  Relationships  . Social connections:    Talks on phone: Not on file    Gets together: Not on file    Attends religious service: Not on file    Active member of club or organization: Not on file    Attends meetings of clubs or organizations: Not on file    Relationship status: Not on file  Other Topics Concern  . Not on file  Social History Narrative  . Not on file   Additional Social History:    Allergies:  No Known Allergies  Labs:  Results for orders placed or performed during the hospital encounter of 08/06/18 (from the past 48 hour(s))  Comprehensive metabolic panel     Status: Abnormal   Collection Time: 08/06/18  9:37 PM  Result Value Ref Range   Sodium 124 (L) 135 - 145 mmol/L   Potassium 3.1 (L) 3.5 - 5.1 mmol/L   Chloride 85 (L) 98 - 111 mmol/L   CO2 25 22 - 32 mmol/L   Glucose, Bld 105 (H) 70 - 99 mg/dL   BUN <5 (L) 6 - 20 mg/dL   Creatinine, Ser 0.59 0.44 - 1.00 mg/dL   Calcium 8.8 (L) 8.9 - 10.3 mg/dL   Total Protein 7.7 6.5 - 8.1 g/dL   Albumin 3.7 3.5 - 5.0 g/dL   AST 28 15 - 41 U/L   ALT 9 0 - 44 U/L   Alkaline Phosphatase 62 38 - 126 U/L   Total Bilirubin 0.4 0.3 - 1.2 mg/dL   GFR calc non Af Amer >60 >60 mL/min   GFR calc Af Amer >60 >60 mL/min   Anion gap 14 5 - 15    Comment: Performed at [facility], [address] [place], [place] [address]  Ethanol     Status: None   Collection Time: 08/06/18  9:37 PM  Result Value Ref Range   Alcohol, Ethyl (B) <10 <10 mg/dL    Comment: (NOTE) Lowest detectable limit for serum alcohol is 10 mg/dL. For medical purposes only. Performed at [facility] [address] [place], [place] [address]   Salicylate level     Status: None   Collection Time: 08/06/18  9:37 PM  Result Value Ref Range   Salicylate Lvl <5.1 2.8 - 30.0 mg/dL    Comment: Performed at [facility], [address], [place], [place] [address]  Acetaminophen level     Status: Abnormal   Collection Time: 08/06/18  9:37 PM  Result Value Ref Range  Acetaminophen (Tylenol), Serum <10 (L) 10 - 30 ug/mL    Comment: (NOTE) Therapeutic concentrations vary significantly. A range of 10-30 ug/mL  may be an effective concentration for many patients. However, some  are best treated at concentrations outside of this range. Acetaminophen concentrations >150 ug/mL at 4 hours after ingestion  and >50 ug/mL at 12 hours after ingestion are often associated with  toxic reactions. Performed at [facility], [address] [place], [place] [address]   cbc     Status: Abnormal   Collection Time: 08/06/18  9:37 PM  Result Value Ref Range   WBC 11.9 (H) 4.0 - 10.5 K/uL   RBC 3.96 3.87 - 5.11 MIL/uL   Hemoglobin 12.6 12.0 - 15.0 g/dL   HCT 35.1 (L) 36.0 - 46.0 %   MCV 88.6 80.0 - 100.0 fL   MCH 31.8 26.0 - 34.0 pg   MCHC 35.9 30.0 - 36.0 g/dL   RDW 12.2 11.5 - 15.5 %   Platelets 254 150 - 400 K/uL   nRBC 0.0 0.0 - 0.2 %    Comment: Performed at [facility], [address] [place], [place] [address]  Urine Drug Screen, Qualitative     Status: None   Collection Time: 08/06/18  9:37 PM  Result Value Ref Range   Tricyclic, Ur Screen NONE DETECTED NONE DETECTED   Amphetamines, Ur Screen NONE DETECTED NONE DETECTED   MDMA (Ecstasy)Ur Screen NONE DETECTED NONE DETECTED   Cocaine Metabolite,Ur Impact NONE DETECTED NONE DETECTED   Opiate, Ur Screen NONE DETECTED NONE DETECTED   Phencyclidine (PCP) Ur S NONE DETECTED NONE DETECTED   Cannabinoid 50 Ng, Ur [place] NONE DETECTED NONE DETECTED   Barbiturates, Ur Screen NONE DETECTED NONE DETECTED   Benzodiazepine, Ur Scrn NONE  DETECTED NONE DETECTED   Methadone Scn, Ur NONE DETECTED NONE DETECTED    Comment: (NOTE) Tricyclics + metabolites, urine    Cutoff 1000 ng/mL Amphetamines + metabolites, urine  Cutoff 1000 ng/mL MDMA (Ecstasy), urine              Cutoff 500 ng/mL Cocaine Metabolite, urine          Cutoff 300 ng/mL Opiate + metabolites, urine        Cutoff 300 ng/mL Phencyclidine (PCP), urine         Cutoff 25 ng/mL Cannabinoid, urine                 Cutoff 50 ng/mL Barbiturates + metabolites, urine  Cutoff 200 ng/mL Benzodiazepine, urine              Cutoff 200 ng/mL Methadone, urine                   Cutoff 300 ng/mL The urine drug screen provides only a preliminary, unconfirmed analytical test result and should not be used for non-medical purposes. Clinical consideration and professional judgment should be applied to any positive drug screen result due to possible interfering substances. A more specific alternate chemical method must be used in order to obtain a confirmed analytical result. Gas chromatography / mass spectrometry (GC/MS) is the preferred confirmat ory method. Performed at [facility], [address] [place], [place] [address]   Basic metabolic panel     Status: Abnormal   Collection Time: 08/07/18  4:32 AM  Result Value Ref Range   Sodium 133 (L) 135 - 145 mmol/L   Potassium 3.3 (L) 3.5 - 5.1 mmol/L   Chloride 97 (  L) 98 - 111 mmol/L   CO2 26 22 - 32 mmol/L   Glucose, Bld 99 70 - 99 mg/dL   BUN <5 (L) 6 - 20 mg/dL   Creatinine, Ser 0.62 0.44 - 1.00 mg/dL   Calcium 8.7 (L) 8.9 - 10.3 mg/dL   GFR calc non Af Amer >60 >60 mL/min   GFR calc Af Amer >60 >60 mL/min   Anion gap 10 5 - 15    Comment: Performed at [facility], [address], [place], [place] [address]    Current Facility-Administered Medications  Medication Dose Route Frequency Provider Last Rate Last Dose  . ARIPiprazole (ABILIFY) tablet 10 mg  10 mg Oral QHS Patience Musca, MD   10  mg at 08/07/18 2111  . benztropine (COGENTIN) tablet 1 mg  1 mg Oral BID Abran Richard C, MD   1 mg at 08/08/18 1024  . haloperidol lactate (HALDOL) injection 5 mg  5 mg Intramuscular Q6H PRN Patience Musca, MD       And  . diphenhydrAMINE (BENADRYL) injection 50 mg  50 mg Intramuscular Q6H PRN Patience Musca, MD       And  . LORazepam (ATIVAN) injection 2 mg  2 mg Intramuscular Q6H PRN Abran Richard C, MD      . divalproex (DEPAKOTE SPRINKLE) capsule 250 mg  250 mg Oral Q8H Abran Richard C, MD   250 mg at 08/08/18 1151  . famotidine (PEPCID) tablet 20 mg  20 mg Oral Daily Patience Musca, MD   20 mg at 08/08/18 1024  . levothyroxine (SYNTHROID, LEVOTHROID) tablet 88 mcg  88 mcg Oral Q0600 Patience Musca, MD   88 mcg at 08/08/18 0900  . loratadine (CLARITIN) tablet 10 mg  10 mg Oral Daily Patience Musca, MD   10 mg at 08/08/18 1023  . OLANZapine (ZYPREXA) tablet 5 mg  5 mg Oral Q4H PRN Patience Musca, MD      . traZODone (DESYREL) tablet 100 mg  100 mg Oral QHS PRN Patience Musca, MD   100 mg at 08/07/18 2111   Current Outpatient Medications  Medication Sig Dispense Refill  . ARIPiprazole (ABILIFY) 5 MG tablet Take 5 mg by mouth at bedtime.    . benztropine (COGENTIN) 1 MG tablet Take 1 mg by mouth 2 (two) times daily.    . cetirizine (ZYRTEC) 10 MG tablet Take 1 tablet (10 mg total) by mouth daily as needed for allergies.    Marland Kitchen divalproex (DEPAKOTE ER) 250 MG 24 hr tablet Take 375 mg by mouth at bedtime.     . divalproex (DEPAKOTE ER) 500 MG 24 hr tablet Take 750 mg by mouth at bedtime.     . haloperidol decanoate (HALDOL DECANOATE) 100 MG/ML injection Inject 100 mg into the muscle every 28 (twenty-eight) days.    Marland Kitchen levothyroxine (SYNTHROID, LEVOTHROID) 88 MCG tablet Take 88 mcg by mouth daily before breakfast.    . ranitidine (ZANTAC) 150 MG tablet Take 150 mg by mouth 2 (two) times daily.    . Valproate Sodium (VALPROIC ACID) 250 MG/5ML SOLN  Take 250-500 mg by mouth See admin instructions. Take 72ml (250mg ) by mouth every morning and 1ml (500mg ) by mouth every night at bedtime      Musculoskeletal: Strength & Muscle Tone: within normal limits Gait & Station: normal Patient leans: N/A  Psychiatric Specialty Exam: Physical Exam  Constitutional: She appears well-developed and well-nourished.  HENT:  Head: Normocephalic and  atraumatic.  Eyes: Pupils are equal, round, and reactive to light. EOM are normal.  Neck: Normal range of motion. Neck supple.  Cardiovascular: Normal rate and regular rhythm.  Respiratory: Effort normal and breath sounds normal.  Musculoskeletal: Normal range of motion.  Neurological: She is alert.  Skin: Skin is warm and dry.    ROS  Blood pressure 121/66, pulse 91, temperature (!) 97.4 F (36.3 C), temperature source Axillary, resp. rate 16, height 5\' 2"  (1.575 m), weight 81.6 kg, SpO2 98 %.Body mass index is 32.92 kg/m.  General Appearance: Fairly Groomed  Eye Contact:  Good  Speech:  Poorly enunciated  Volume:  Increased  Mood:  Euthymic  Affect:  Congruent  Thought Process:  Disorganized  Orientation:  Other:  First name and setting  Thought Content:  no evidence acute psychosis, at baseline cognition mild IDD  Suicidal Thoughts:  No  Homicidal Thoughts:  No  Memory:  Immediate;   Poor Recent;   Poor Remote;   Poor  Judgement:  Impaired  Insight:  Lacking  Psychomotor Activity:  Normal  Concentration:  Concentration: Poor and Attention Span: Poor  Recall:  Poor  Fund of Knowledge:  Poor  Language:  Fair  Akathisia:  No  Handed:  Right  AIMS (if indicated):     Assets:  Housing Social Support  ADL's:  Intact  Cognition:  Impaired,  Mild  Sleep:        Treatment Plan Summary: Plan She has cleared well, discharge home   Disposition: No evidence of imminent risk to self or others at present.   Patient does not meet criteria for psychiatric inpatient admission. Discharge home  to parents.Rx for Abilify 10mg  qhs.  Patience Musca, MD 08/08/2018 11:56 AM

## 2018-08-08 NOTE — ED Provider Notes (Signed)
Patient has been cleared by psychiatry for discharge.   Earleen Newport, MD 08/08/18 [phone]

## 2018-08-08 NOTE — ED Provider Notes (Signed)
-----------------------------------------   3:14 AM on 08/08/2018 -----------------------------------------   Blood pressure 121/66, pulse 91, temperature (!) 97.4 F (36.3 C), temperature source Axillary, resp. rate 16, height 5\' 2"  (1.575 m), weight 81.6 kg, SpO2 98 %.  The patient is calm and comfortably resting in room.  There have been no acute events since the last update.  Awaiting disposition plan from Behavioral Medicine team.    Delman Kitten, MD 08/08/18 [phone]

## 2018-08-13 ENCOUNTER — Other Ambulatory Visit: Payer: Self-pay

## 2018-08-13 ENCOUNTER — Encounter: Payer: Self-pay | Admitting: Emergency Medicine

## 2018-08-13 ENCOUNTER — Emergency Department
Admission: EM | Admit: 2018-08-13 | Discharge: 2018-08-13 | Disposition: A | Discharge status: Home / Self Care | Payer: Medicaid Other | Attending: Emergency Medicine | Admitting: Emergency Medicine

## 2018-08-13 DIAGNOSIS — F201 Disorganized schizophrenia: Secondary | ICD-10-CM | POA: Diagnosis not present

## 2018-08-13 DIAGNOSIS — F209 Schizophrenia, unspecified: Secondary | ICD-10-CM

## 2018-08-13 DIAGNOSIS — Z046 Encounter for general psychiatric examination, requested by authority: Secondary | ICD-10-CM | POA: Diagnosis present

## 2018-08-13 LAB — CBC
HCT: 42.2 % (ref 36.0–46.0)
Hemoglobin: 14 g/dL (ref 12.0–15.0)
MCH: 31.5 pg (ref 26.0–34.0)
MCHC: 33.2 g/dL (ref 30.0–36.0)
MCV: 95 fL (ref 80.0–100.0)
Platelets: 340 10*3/uL (ref 150–400)
RBC: 4.44 MIL/uL (ref 3.87–5.11)
RDW: 13.3 % (ref 11.5–15.5)
WBC: 8.6 10*3/uL (ref 4.0–10.5)
nRBC: 0 % (ref 0.0–0.2)

## 2018-08-13 LAB — COMPREHENSIVE METABOLIC PANEL
ALT: 10 U/L (ref 0–44)
AST: 25 U/L (ref 15–41)
Albumin: 4.3 g/dL (ref 3.5–5.0)
Alkaline Phosphatase: 68 U/L (ref 38–126)
Anion gap: 10 (ref 5–15)
BILIRUBIN TOTAL: 1 mg/dL (ref 0.3–1.2)
BUN: 12 mg/dL (ref 6–20)
CO2: 27 mmol/L (ref 22–32)
CREATININE: 0.7 mg/dL (ref 0.44–1.00)
Calcium: 9.6 mg/dL (ref 8.9–10.3)
Chloride: 102 mmol/L (ref 98–111)
GFR calc Af Amer: >60 mL/min (ref >60)
GFR calc non Af Amer: >60 mL/min (ref >60)
GLUCOSE: 101 mg/dL — AB (ref 70–99)
Potassium: 4.4 mmol/L (ref 3.5–5.1)
Sodium: 139 mmol/L (ref 135–145)
Total Protein: 8.7 g/dL — ABNORMAL HIGH (ref 6.5–8.1)

## 2018-08-13 LAB — ETHANOL: Alcohol, Ethyl (B): <10 mg/dL (ref <10)

## 2018-08-13 MED ORDER — VALPROIC ACID 250 MG/5ML PO SOLN: 500.0000 mg | Freq: Two times a day (BID) | ORAL | 0 refills | Status: DC

## 2018-08-13 MED ORDER — ARIPIPRAZOLE 10 MG PO TABS: 20.0000 mg | ORAL_TABLET | Freq: Every day | ORAL | 0 refills | Status: DC

## 2018-08-13 NOTE — BH Assessment (Signed)
Assessment Note  Tamara [name] is an 48 y.o. female. Tamara [name] arrived to the ED after engaging in an argument with her mother.  She denied symptoms of depression.  She denied symptoms of anxiety.  She denied having auditory or visual hallucinations.  She denied having suicidal or homicidal ideation or intent.  She reports, "My momma is trying to throw me out of the house. "  She reports, "They taking my clothes and my money".    TTS attempted to contact mother Angelize Ryce [phone]) No one answered the phone. A HIPPA compliant message was left.  Diagnosis:   Past Medical History:  Past Medical History:  Diagnosis Date  . GERD (gastroesophageal reflux disease)   . IUD (intrauterine device) in place IUD was placed in 2016  . Leiomyoma of uterus   . Schizophrenia ([place])   . Thyroid disease     History reviewed. No pertinent surgical history.  Family History: No family history on file.  Social History:  reports that she has never smoked. She has never used smokeless tobacco. She reports that she does not drink alcohol or use drugs.  Additional Social History:  Alcohol / Drug Use History of alcohol / drug use?: No history of alcohol / drug abuse  CIWA: CIWA-Ar BP: (!) 150/61 Pulse Rate: (!) 103 COWS:    Allergies: No Known Allergies  Home Medications: (Not in a hospital admission)   OB/GYN Status:  No LMP recorded (lmp unknown). (Menstrual status: Other).  General Assessment Data Location of Assessment: [facility] ED TTS Assessment: In system Is this a Tele or Face-to-Face Assessment?: Face-to-Face Is this an Initial Assessment or a Re-assessment for this encounter?: Initial Assessment Patient Accompanied by:: N/A Language Other than English: No Living Arrangements: Other (Comment)(Private residence) What gender do you identify as?: Female Marital status: Single Pregnancy Status: No Living Arrangements: Parent, Other relatives Can pt return to current living  arrangement?: Yes Admission Status: Voluntary Is patient capable of signing voluntary admission?: No Referral Source: Self/Family/Friend Insurance type: Medicare  Medical Screening Exam ([place]) Medical Exam completed: Yes  Crisis Care Plan Living Arrangements: Parent, Other relatives Legal Guardian: Mother Name of Psychiatrist: Dr. Almeta Monas Name of Therapist: None  Education Status Is patient currently in school?: No  Risk to self with the past 6 months Suicidal Ideation: No Has patient been a risk to self within the past 6 months prior to admission? : No Suicidal Intent: No Has patient had any suicidal intent within the past 6 months prior to admission? : No Is patient at risk for suicide?: No Suicidal Plan?: No Has patient had any suicidal plan within the past 6 months prior to admission? : No Access to Means: No What has been your use of drugs/alcohol within the last 12 months?: Denied use Previous Attempts/Gestures: No How many times?: 0 Other Self Harm Risks: denied Triggers for Past Attempts: None known Intentional Self Injurious Behavior: None Family Suicide History: No Recent stressful life event(s): Conflict (Comment)(Arguing with mother) Persecutory voices/beliefs?: No(Denied by patient) Depression: No Depression Symptoms: (denied by patient) Substance abuse history and/or treatment for substance abuse?: No Suicide prevention information given to non-admitted patients: Not applicable  Risk to Others within the past 6 months Homicidal Ideation: No Does patient have any lifetime risk of violence toward others beyond the six months prior to admission? : No Thoughts of Harm to Others: No Current Homicidal Intent: No Current Homicidal Plan: No Access to Homicidal Means: No Identified Victim: None identified  History of harm to others?: No Assessment of Violence: None Noted Violent Behavior Description: None reported Does patient have access to  weapons?: No Criminal Charges Pending?: No Does patient have a court date: No Is patient on probation?: No  Psychosis Hallucinations: None noted(Denied by patient) Delusions: (Unable to assess)  Mental Status Report Appearance/Hygiene: In scrubs, Unremarkable Eye Contact: Good Motor Activity: Unremarkable Speech: Logical/coherent Level of Consciousness: Alert Mood: Euthymic Affect: Appropriate to circumstance Anxiety Level: None Thought Processes: Relevant Judgement: Partial Orientation: Appropriate for developmental age Obsessive Compulsive Thoughts/Behaviors: None  Cognitive Functioning Concentration: Normal Memory: Recent Intact Is patient IDD: Yes Level of Function: Information is unavailable Is IQ score available?: No Insight: Fair Impulse Control: Fair Appetite: Good Have you had any weight changes? : No Change Sleep: No Change Vegetative Symptoms: None  ADLScreening [facility] Assessment Services) [name] cognitive ability adequate to safely complete daily activities?: Yes Patient able to express need for assistance with ADLs?: Yes Independently performs ADLs?: Yes (appropriate for developmental age)  Prior Inpatient Therapy Prior Inpatient Therapy: No  Prior Outpatient Therapy Prior Outpatient Therapy: Yes Prior Therapy Dates: Current Prior Therapy Facilty/Provider(s): Dr. Arvilla Meres Reason for Treatment: Schizophrenia Does patient have an ACCT team?: No Does patient have Intensive In-House Services?  : No Does patient have [facility] services? : No Does patient have P4CC services?: No  ADL Screening (condition at time of admission) [name] cognitive ability adequate to safely complete daily activities?: Yes Is the patient deaf or have difficulty hearing?: No Does the patient have difficulty seeing, even when wearing glasses/contacts?: No Does the patient have difficulty concentrating, remembering, or making decisions?: No Patient able to express need for  assistance with ADLs?: Yes Does the patient have difficulty dressing or bathing?: No Independently performs ADLs?: Yes (appropriate for developmental age) Does the patient have difficulty walking or climbing stairs?: No Weakness of Legs: None Weakness of Arms/Hands: None  Home Assistive Devices/Equipment Home Assistive Devices/Equipment: None          Advance Directives (For Healthcare) Does Patient Have a Medical Advance Directive?: No Would patient like information on creating a medical advance directive?: No - Patient declined          Disposition:  Disposition Initial Assessment Completed for this Encounter: Yes  On Site Evaluation by:   Reviewed with Physician:    Elmer Bales 08/13/2018 9:41 PM

## 2018-08-13 NOTE — Discharge Instructions (Addendum)
Return to the emergency department for any thoughts of hurting yourself or anyone else, hallucinations, or any other symptoms concerning to you.

## 2018-08-13 NOTE — ED Notes (Signed)

## 2018-08-13 NOTE — ED Notes (Signed)
SOC in progress.  

## 2018-08-13 NOTE — ED Triage Notes (Signed)
Pt to ED via POV with her mother. Pt states that her mother wanted to put her out of the house because she doesn't want to listen to what she has to say. Pt mother states that pt walked off from their house today and the police had to pick her up and take her to the crisis center, the crisis center referred them here. Pt has hx/o Schizophrenia.

## 2018-08-13 NOTE — ED Notes (Signed)
TTS at bedside. 

## 2018-08-13 NOTE — ED Notes (Signed)
Report given to [facility] doctor.

## 2018-08-13 NOTE — ED Provider Notes (Addendum)
[facility] Emergency Department Provider Note  ____________________________________________  Time seen: Approximately 6:55 PM  I have reviewed the triage vital signs and the nursing notes.   HISTORY  Chief Complaint Psychiatric Evaluation    HPI Tamara [name] is a 48 y.o. female with a history of schizophrenia recently stabilized on her medications brought by police because she left her house today.  She reports that she was having a disagreement with her mother about who pays the bills, and when she left her mother called the police.  She denies any thoughts of hurting herself, suicidal ideations, homicidal ideations or hallucinations.  He has no medical complaints at this time  Past Medical History:  Diagnosis Date  . GERD (gastroesophageal reflux disease)   . IUD (intrauterine device) in place IUD was placed in 2016  . Leiomyoma of uterus   . Schizophrenia ([place])   . Thyroid disease     Patient Active Problem List   Diagnosis Date Noted  . 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 GERD (gastroesophageal reflux disease) 09/11/2015  . Seborrheic dermatitis 09/11/2015  . 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 Schizophrenia ([place]) 09/10/2015  . 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 No pertinent surgical history.  Current Outpatient Rx  . Order #: [id number] Class: Print  . Order #: [id number] Class: Historical Med  . Order #: [id number] Class: No Print  . Order #: [id number] Class: Historical Med  . Order #: [id number] Class: Historical Med  . Order #: [id number] Class: Historical Med  . Order #: [id number] Class: Historical Med    Allergies Patient has no known allergies.  No family history on file.  Social History Social History   Tobacco Use  . Smoking status: Never Smoker  . Smokeless tobacco: Never Used  Substance Use Topics  . Alcohol use: No  . Drug use: No    Review of Systems Constitutional: No  fever/chills.  No lightheadedness or syncope. Eyes: No visual changes. ENT:  No congestion or rhinorrhea. Cardiovascular: Denies chest pain. Denies palpitations. Respiratory: Denies shortness of breath.  No cough. Gastrointestinal: No abdominal pain.  No nausea, no vomiting.  No diarrhea.  No constipation. Genitourinary: Negative for dysuria. Musculoskeletal: Negative for back pain. Skin: Negative for rash. Neurological: Negative for headaches. No focal numbness, tingling or weakness.  Psychiatric:No SI, HI or hallucinations.   ____________________________________________   PHYSICAL EXAM:  VITAL SIGNS: ED Triage Vitals  Enc Vitals Group     BP 08/13/18 1819 (!) 150/61     Pulse Rate 08/13/18 1819 (!) 103     Resp 08/13/18 1819 16     Temp 08/13/18 1819 98.4 F (36.9 C)     Temp Source 08/13/18 1819 Oral     SpO2 08/13/18 1819 100 %     Weight --      Height --      Head Circumference --      Peak Flow --      Pain Score 08/13/18 1820 0     Pain Loc --      Pain Edu? --      Excl. in [place]? --     Constitutional: Alert and oriented. Well appearing and in no acute distress. Answers questions appropriately. Eyes: Conjunctivae are normal.  EOMI. No scleral icterus. Head: Atraumatic. Nose: No congestion/rhinnorhea. Mouth/Throat: Mucous membranes are moist.  Neck: No stridor.  Supple.   Cardiovascular: Normal rate Respiratory: Normal respiratory effort.   Musculoskeletal: Moves all extremities well Neurologic:  A&Ox3.  Speech is  clear.  Face and smile are symmetric.  EOMI.  Moves all extremities well. Skin:  Skin is warm, dry and intact. No rash noted. Psychiatric: The patient's mood is normal, she is calm and cooperative.  She occasionally has some mild pressured speech but this is intermittent.  She has good insight into why she is here.  In my examination, the patient denies any SI, HI or hallucinations.  ____________________________________________   LABS (all labs  ordered are listed, but only abnormal results are displayed)  Labs Reviewed  CBC  COMPREHENSIVE METABOLIC PANEL  ETHANOL  URINE DRUG SCREEN, QUALITATIVE ([place])  POC URINE PREG, ED   ____________________________________________  EKG  Not indicated ____________________________________________  RADIOLOGY  No results found.  ____________________________________________   PROCEDURES  Procedure(s) performed: None  Procedures  Critical Care performed: No ____________________________________________   INITIAL IMPRESSION / ASSESSMENT AND PLAN / ED COURSE  Pertinent labs & imaging results that were available during my care of the patient were reviewed by me and considered in my medical decision making (see chart for details).  48 y.o. female with a history of schizophrenia, recently stabilized on her medications, by police because she left her home after having a disagreement with her mother.  Overall, the patient is hemodynamically stable and rate reassuring in terms of her medical examination as well as her psychiatric complaints.  We will get a psych evaluation for full corroboration, but I anticipate discharge home.  She does not meet criteria for involuntary commitment.  ----------------------------------------- 11:16 PM on 08/13/2018 -----------------------------------------  The patient has been seen and evaluated by tele-psychiatry who have recommended discharge with some medication adjustments; I have provided the patient with new prescriptions.  The patient is safe for discharge at this time.  Her mother is here to pick her up.  ____________________________________________  FINAL CLINICAL IMPRESSION(S) / ED DIAGNOSES  Final diagnoses:  Schizophrenia, unspecified type ([place])         NEW MEDICATIONS STARTED DURING THIS VISIT:  New Prescriptions   No medications on file      Eula Listen, MD 08/13/18 1900    Eula Listen,  MD 08/13/18 2317

## 2018-08-13 NOTE — ED Notes (Signed)
Pt verbalizes   "I go to my therapy - I get my shots there too - I take my pills everyday   - I don't know why my momma brought me here today except she got a lot of men that come into my house and they have condoms - I left today cause they scare me"

## 2018-08-13 NOTE — ED Notes (Signed)
Pt dressed out by this tech.  Pt Belongings: Red pants [employer] and black shoes Black socks Tan and white striped shirt Black bra Gray underwear

## 2018-08-13 NOTE — ED Notes (Signed)
Pt denies SI/HI/AVH. [name] (guardian) given discharge instructions including prescriptions. Mother states understanding. Mother states receipt of all belongings.

## 2019-02-03 ENCOUNTER — Emergency Department
Admission: EM | Admit: 2019-02-03 | Discharge: 2019-02-06 | Disposition: A | Discharge status: Home / Self Care | Payer: Medicaid Other | Attending: Emergency Medicine | Admitting: Emergency Medicine

## 2019-02-03 ENCOUNTER — Other Ambulatory Visit: Payer: Self-pay

## 2019-02-03 ENCOUNTER — Encounter: Payer: Self-pay | Admitting: Emergency Medicine

## 2019-02-03 DIAGNOSIS — R631 Polydipsia: Secondary | ICD-10-CM | POA: Diagnosis present

## 2019-02-03 DIAGNOSIS — R462 Strange and inexplicable behavior: Secondary | ICD-10-CM | POA: Diagnosis present

## 2019-02-03 DIAGNOSIS — Z79899 Other long term (current) drug therapy: Secondary | ICD-10-CM | POA: Diagnosis not present

## 2019-02-03 DIAGNOSIS — F200 Paranoid schizophrenia: Secondary | ICD-10-CM | POA: Diagnosis present

## 2019-02-03 DIAGNOSIS — E031 Congenital hypothyroidism without goiter: Secondary | ICD-10-CM | POA: Diagnosis present

## 2019-02-03 DIAGNOSIS — Z20828 Contact with and (suspected) exposure to other viral communicable diseases: Secondary | ICD-10-CM | POA: Insufficient documentation

## 2019-02-03 DIAGNOSIS — L219 Seborrheic dermatitis, unspecified: Secondary | ICD-10-CM | POA: Diagnosis present

## 2019-02-03 DIAGNOSIS — F54 Psychological and behavioral factors associated with disorders or diseases classified elsewhere: Secondary | ICD-10-CM | POA: Diagnosis present

## 2019-02-03 DIAGNOSIS — F209 Schizophrenia, unspecified: Secondary | ICD-10-CM | POA: Diagnosis not present

## 2019-02-03 DIAGNOSIS — K219 Gastro-esophageal reflux disease without esophagitis: Secondary | ICD-10-CM | POA: Diagnosis present

## 2019-02-03 DIAGNOSIS — F22 Delusional disorders: Secondary | ICD-10-CM

## 2019-02-03 DIAGNOSIS — F201 Disorganized schizophrenia: Secondary | ICD-10-CM | POA: Diagnosis not present

## 2019-02-03 DIAGNOSIS — F203 Undifferentiated schizophrenia: Secondary | ICD-10-CM | POA: Diagnosis not present

## 2019-02-03 DIAGNOSIS — Z975 Presence of (intrauterine) contraceptive device: Secondary | ICD-10-CM | POA: Diagnosis not present

## 2019-02-03 DIAGNOSIS — F70 Mild intellectual disabilities: Secondary | ICD-10-CM | POA: Diagnosis present

## 2019-02-03 LAB — COMPREHENSIVE METABOLIC PANEL
ALT: 9 U/L (ref 0–44)
AST: 14 U/L — ABNORMAL LOW (ref 15–41)
Albumin: 4.2 g/dL (ref 3.5–5.0)
Alkaline Phosphatase: 54 U/L (ref 38–126)
Anion gap: 11 (ref 5–15)
BUN: 9 mg/dL (ref 6–20)
CO2: 28 mmol/L (ref 22–32)
Calcium: 9.7 mg/dL (ref 8.9–10.3)
Chloride: 98 mmol/L (ref 98–111)
Creatinine, Ser: 0.81 mg/dL (ref 0.44–1.00)
GFR calc Af Amer: >60 mL/min (ref >60)
GFR calc non Af Amer: >60 mL/min (ref >60)
Glucose, Bld: 90 mg/dL (ref 70–99)
Potassium: 3.2 mmol/L — ABNORMAL LOW (ref 3.5–5.1)
Sodium: 137 mmol/L (ref 135–145)
Total Bilirubin: 0.9 mg/dL (ref 0.3–1.2)
Total Protein: 8.1 g/dL (ref 6.5–8.1)

## 2019-02-03 LAB — CBC
HCT: 40 % (ref 36.0–46.0)
Hemoglobin: 13.6 g/dL (ref 12.0–15.0)
MCH: 31.1 pg (ref 26.0–34.0)
MCHC: 34 g/dL (ref 30.0–36.0)
MCV: 91.3 fL (ref 80.0–100.0)
Platelets: 226 10*3/uL (ref 150–400)
RBC: 4.38 MIL/uL (ref 3.87–5.11)
RDW: 12.4 % (ref 11.5–15.5)
WBC: 6.3 10*3/uL (ref 4.0–10.5)
nRBC: 0 % (ref 0.0–0.2)

## 2019-02-03 LAB — SALICYLATE LEVEL: Salicylate Lvl: <7 mg/dL (ref 2.8–30.0)

## 2019-02-03 LAB — ACETAMINOPHEN LEVEL: Acetaminophen (Tylenol), Serum: <10 ug/mL — ABNORMAL LOW (ref 10–30)

## 2019-02-03 LAB — ETHANOL: Alcohol, Ethyl (B): <10 mg/dL (ref <10)

## 2019-02-03 NOTE — ED Notes (Signed)
Patient continues to come out of room. Patient is being redirected by staff.

## 2019-02-03 NOTE — BH Assessment (Signed)
TTS attempted to assess pt; pt was unable to participate in assessment.

## 2019-02-03 NOTE — ED Notes (Signed)
Attempted to retrieve vital signs from patient, she was very agitated. Will try later.AS

## 2019-02-03 NOTE — Consult Note (Signed)
Attempted to speak with patient but she is not able to communicate easily. Her speech is extremely slow and slurred. There was not a single comprehendible word. I attempted to contact mother, but no one answered and there was no voicemail left due to non identifiable voicemail. Will attempt to contact mother at a later time.

## 2019-02-03 NOTE — ED Notes (Addendum)
Pt dressed out at this time and ALL belongings including: 3 shirts, sports bra, leggings, socks, tennis shoes, and underwear given to Mother in [place], states she will take them home with her

## 2019-02-03 NOTE — ED Notes (Signed)
Pt given meal tray and hand hygiene encouraged.

## 2019-02-03 NOTE — ED Provider Notes (Signed)
[facility] Emergency Department Provider Note   ____________________________________________   First MD Initiated Contact with Patient 02/03/19 1223     (approximate)  I have reviewed the triage vital signs and the nursing notes.   HISTORY  Chief Complaint Psychiatric Evaluation    HPI Tamara [name] is a 48 y.o. female patient brought in by her mother.  Mother reports she has been taking her medicines but for the last 3 weeks has not been wanting to bathe or change her clothes.  She seems to be getting more more paranoid.  Here in the emergency room the patient appears to be very scared not answering questions possibly responding to internal stimuli still difficult to tell.         Past Medical History:  Diagnosis Date  . GERD (gastroesophageal reflux disease)   . IUD (intrauterine device) in place IUD was placed in 2016  . Leiomyoma of uterus   . Schizophrenia ([place])   . Thyroid disease     Patient Active Problem List   Diagnosis Date Noted  . 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 GERD (gastroesophageal reflux disease) 09/11/2015  . Seborrheic dermatitis 09/11/2015  . 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 Schizophrenia ([place]) 09/10/2015  . 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 No pertinent surgical history.  Prior to Admission medications   Medication Sig Start Date End Date Taking? Authorizing Provider  ARIPiprazole (ABILIFY) 5 MG tablet Take 5 mg by mouth at bedtime. 01/17/19  Yes [provider]  BANOPHEN 25 MG capsule Take 25 mg by mouth at bedtime. 12/30/18  Yes [provider]  cetirizine (ZYRTEC) 10 MG tablet Take 1 tablet (10 mg total) by mouth daily as needed for allergies. Patient taking differently: Take 10 mg by mouth daily.  09/13/15  Yes Hildred Priest, MD  divalproex (DEPAKOTE ER) 250 MG 24 hr tablet Take 375 mg by mouth at bedtime. 01/17/19  Yes [provider]  divalproex (DEPAKOTE ER) 500 MG 24 hr tablet Take 750 mg by mouth at bedtime. 01/17/19  Yes [provider]  haloperidol decanoate (HALDOL DECANOATE) 100 MG/ML injection Inject 100 mg into the muscle every 28 (twenty-eight) days.   Yes [provider]  levothyroxine (SYNTHROID, LEVOTHROID) 88 MCG tablet Take 88 mcg by mouth daily before breakfast.   Yes [provider]    Allergies Patient has no known allergies.  No family history on file.  Social History Social History   Tobacco Use  . Smoking status: Never Smoker  . Smokeless tobacco: Never Used  Substance Use Topics  . Alcohol use: No  . Drug use: No    Review of Systems Patient not answering questions to do this part of the exam  ____________________________________________   PHYSICAL EXAM:  VITAL SIGNS: ED Triage Vitals  Enc Vitals Group     BP 02/03/19 1146 131/66     Pulse Rate 02/03/19 1146 86     Resp 02/03/19 1146 20     Temp 02/03/19 1146 98.6 F (37 C)     Temp Source 02/03/19 1146 Oral     SpO2 02/03/19 1146 100 %     Weight --      Height --      Head Circumference --      Peak Flow --      Pain Score 02/03/19 1147 0     Pain Loc --      Pain Edu? --  Excl. in [place]? --     Constitutional: Alert walking around in room not answering questions.  Looks very scared Eyes: Conjunctivae are normal. PER. EOMI. Head: Atraumatic. Nose: No congestion/rhinnorhea. Mouth/Throat: Mucous membranes are moist. Neck: No stridor.  Cardiovascular: Normal rate, regular rhythm. Grossly normal heart sounds.  Good peripheral circulation. Respiratory: Normal respiratory effort.  No retractions. Lungs CTAB. Gastrointestinal: Soft and nontender. No distention. No abdominal bruits. Musculoskeletal: No lower extremity tenderness  Neurologic:  Normal speech and language. No gross focal neurologic deficits are appreciated. No gait instability. Skin:  Skin is warm, dry and intact.  No rash noted. ________________________________________   LABS (all labs ordered are listed, but only abnormal results are displayed)  Labs Reviewed  COMPREHENSIVE METABOLIC PANEL - Abnormal; Notable for the following components:      Result Value   Potassium 3.2 (*)    AST 14 (*)    All other components within normal limits  ACETAMINOPHEN LEVEL - Abnormal; Notable for the following components:   Acetaminophen (Tylenol), Serum <10 (*)    All other components within normal limits  ETHANOL  SALICYLATE LEVEL  CBC  URINE DRUG SCREEN, QUALITATIVE ([facility] ONLY)   ____________________________________________  EKG   ____________________________________________  RADIOLOGY  ED MD interpretation:   Official radiology report(s): No results found.  ____________________________________________   PROCEDURES  Procedure(s) performed (including Critical Care):  Procedures   ____________________________________________   INITIAL IMPRESSION / ASSESSMENT AND PLAN / ED COURSE  Tamara [name] was evaluated in Emergency Department on 02/03/2019 for the symptoms described in the history of present illness. She was evaluated in the context of the global COVID-19 pandemic, which necessitated consideration that the patient might be at risk for infection with the SARS-CoV-2 virus that causes COVID-19. Institutional protocols and algorithms that pertain to the evaluation of patients at risk for COVID-19 are in a state of rapid change based on information released by regulatory bodies including the CDC and federal and state organizations. These policies and algorithms were followed during the patient's care in the ED.       Patient pending psychiatry consult.       ____________________________________________   FINAL CLINICAL IMPRESSION(S) / ED DIAGNOSES  Final diagnoses:  Schizophrenia, unspecified type ([place])  Paranoia [facility])     ED Discharge Orders    None       Note:   This document was prepared using Dragon voice recognition software and may include unintentional dictation errors.    Nena Polio, MD 02/03/19 [phone]

## 2019-02-03 NOTE — ED Notes (Signed)
IVC/Consult pending patient unable to communicate with FNP

## 2019-02-03 NOTE — ED Triage Notes (Signed)
PT her with mother who is her legal guardian. States pt has been acting out x68month, not taking her meds. Hx of schizophrenia. PT denies SI/HI. NAD noted

## 2019-02-04 ENCOUNTER — Encounter: Payer: Self-pay | Admitting: Behavioral Health

## 2019-02-04 DIAGNOSIS — F201 Disorganized schizophrenia: Secondary | ICD-10-CM

## 2019-02-04 LAB — URINE DRUG SCREEN, QUALITATIVE (ARMC ONLY)
Amphetamines, Ur Screen: NOT DETECTED
Barbiturates, Ur Screen: NOT DETECTED
Benzodiazepine, Ur Scrn: NOT DETECTED
Cannabinoid 50 Ng, Ur ~~LOC~~: NOT DETECTED
Cocaine Metabolite,Ur ~~LOC~~: NOT DETECTED
MDMA (Ecstasy)Ur Screen: NOT DETECTED
Methadone Scn, Ur: NOT DETECTED
Opiate, Ur Screen: NOT DETECTED
Phencyclidine (PCP) Ur S: NOT DETECTED
Tricyclic, Ur Screen: NOT DETECTED

## 2019-02-04 LAB — SARS CORONAVIRUS 2 BY RT PCR (HOSPITAL ORDER, PERFORMED IN ~~LOC~~ HOSPITAL LAB): SARS Coronavirus 2: NEGATIVE

## 2019-02-04 MED ORDER — LORAZEPAM 2 MG/ML IJ SOLN
2.0000 mg | Freq: Two times a day (BID) | INTRAMUSCULAR | Status: DC
  Administered 2019-02-04: 2 mg via INTRAMUSCULAR
  Filled 2019-02-04: qty 1

## 2019-02-04 MED ORDER — HALOPERIDOL 5 MG PO TABS
5.0000 mg | ORAL_TABLET | Freq: Two times a day (BID) | ORAL | Status: DC
  Administered 2019-02-04 – 2019-02-06 (×4): 5 mg via ORAL
  Filled 2019-02-04 (×4): qty 1

## 2019-02-04 MED ORDER — HALOPERIDOL LACTATE 5 MG/ML IJ SOLN
5.0000 mg | Freq: Two times a day (BID) | INTRAMUSCULAR | Status: DC
  Administered 2019-02-04: 5 mg via INTRAMUSCULAR
  Filled 2019-02-04: qty 1

## 2019-02-04 MED ORDER — LORAZEPAM 2 MG PO TABS
2.0000 mg | ORAL_TABLET | Freq: Two times a day (BID) | ORAL | Status: DC
  Administered 2019-02-04 – 2019-02-05 (×2): 2 mg via ORAL
  Filled 2019-02-04 (×3): qty 1

## 2019-02-04 NOTE — Consult Note (Addendum)
[place] Psychiatry Consult   Reason for Consult: Psychiatric evaluation Referring Physician: Dr. Rip Harbour Patient Identification: Tamara [name] MRN:  [id number] Principal Diagnosis: Schizophrenia [facility]) Diagnosis:  Principal Problem:   Schizophrenia ([place]) Active Problems:   GERD (gastroesophageal reflux disease)   Seborrheic dermatitis   Mild intellectual disability   Congenital hypothyroidism without goiter   Psychogenic polydipsia   Total Time spent with patient: 45 minutes  Subjective:   Tamara [name] is a 48 y.o. female patient presented to [facility] ED via POV involuntary. The patient's mom discussed that the patient has not been taking her medication for a month. She stated the patient's last hospitalization was in 2019, and the patient was hospitalized in [place], which she remained there for three months.  Mom confirmed the patient psychiatrist for her outpatient treatment is with [facility]. She's under the psychiatric care of Dr. Corena Pilgrim [phone]).  Mom confirmed the patient has not been sleeping nor eating and has not cared for her hygiene. The patient was seen face-to-face by this provider; the chart reviewed and consulted with Dr. Jari Pigg on 02/03/2019 due to the patient's care. It was discussed with the EDP the patient does meet the criteria to be admitted to the psychiatric inpatient unit.  The patient is alert and oriented x 1, anxious, disorganized, and mood-congruent with affect on evaluation. The patient does appear to be responding to internal and external stimuli.  She is presenting with delusional thinking. The patient's mom confirmed that she is experiencing auditory and visual hallucinations. The patient denies suicidal, homicidal, or self-harm ideations. The patient is presenting with psychotic and paranoia behaviors. During an encounter with the patient, she was unable to answer most questions appropriately. Collateral was  obtained by the mother Tamara [name] [id number]), who expresses concerns about the patient's psychotic behaviors. [name] disclosed that the patient ran away from home today (02/03/2019) and the patient's sister had to go looking for her.  She stated when they were unable to locate her; she called the police.  She said the patient walked off, and she almost got hit by a car.  She voiced the patient presents to be afraid of everyone and everything.  She declared she is unable to provide care to the patient when she gets into this psychotic state. Plan: The patient is a safety risk to self and does require psychiatric inpatient admission for stabilization and treatment.    HPI: Per Dr. Rip Harbour; Tamara [name] is a 48 y.o. female patient brought in by her mother.  Mother reports she has been taking her medicines but for the last 3 weeks has not been wanting to bathe or change her clothes.  She seems to be getting more paranoid.  Here in the emergency room the patient appears to be very scared not answering questions possibly responding to internal stimuli still difficult to tell.  Past Psychiatric History:  Schizophrenia  Risk to Self: Suicidal Ideation: (UTA) Suicidal Intent: (UTA) Is patient at risk for suicide?: (UTA) Suicidal Plan?: (UTA) Access to Means: No What has been your use of drugs/alcohol within the last 12 months?: None reported How many times?: 0 Other Self Harm Risks: Reports none, per mother Triggers for Past Attempts: None known(Per mother) Intentional Self Injurious Behavior: None(Per mother) Risk to Others: Homicidal Ideation: (UTA) Thoughts of Harm to Others: (UTA) Current Homicidal Intent: (UTA) Current Homicidal Plan: (UTA) Access to Homicidal Means: (UTA) Identified Victim: n/a History of harm to others?: No(Per patient's mother) Assessment  of Violence: None Noted(Per patient's mother) Violent Behavior Description: None reported Does patient have access to  weapons?: No(Per patient's mother) Criminal Charges Pending?: No(Per patient's mother) Does patient have a court date: No(Per patient's mother) Prior Inpatient Therapy: Prior Inpatient Therapy: Yes Prior Therapy Dates: 07/2017, 05/2016, 10/2013, 01/2013, 04/2011, 10/2010 & 07/2007 Prior Therapy Facilty/Provider(s): [facility]  Reason for Treatment: Schizophrenia Prior Outpatient Therapy: Prior Outpatient Therapy: Yes Prior Therapy Dates: Current Prior Therapy Facilty/Provider(s): [facility] Reason for Treatment: Schizophrenia(Medication Managment) Does patient have an ACCT team?: No Does patient have Intensive In-House Services?  : No Does patient have [facility] services? : No Does patient have P4CC services?: No  Past Medical History:  Past Medical History:  Diagnosis Date  . GERD (gastroesophageal reflux disease)   . IUD (intrauterine device) in place IUD was placed in 2016  . Leiomyoma of uterus   . Schizophrenia ([place])   . Thyroid disease    History reviewed. No pertinent surgical history. Family History: History reviewed. No pertinent family history. Family Psychiatric  History: No pertinent family psychiatric history Social History:  Social History   Substance and Sexual Activity  Alcohol Use No     Social History   Substance and Sexual Activity  Drug Use No    Social History   Socioeconomic History  . Marital status: Single    Spouse name: Not on file  . Number of children: Not on file  . Years of education: Not on file  . Highest education level: Not on file  Occupational History  . Not on file  Social Needs  . Financial resource strain: Not on file  . Food insecurity    Worry: Not on file    Inability: Not on file  . Transportation needs    Medical: Not on file    Non-medical: Not on file  Tobacco Use  . Smoking status: Never Smoker  . Smokeless tobacco: Never Used  Substance and Sexual Activity  .  Alcohol use: No  . Drug use: No  . Sexual activity: Yes    Birth control/protection: Surgical  Lifestyle  . Physical activity    Days per week: Not on file    Minutes per session: Not on file  . Stress: Not on file  Relationships  . Social Herbalist on phone: Not on file    Gets together: Not on file    Attends religious service: Not on file    Active member of club or organization: Not on file    Attends meetings of clubs or organizations: Not on file    Relationship status: Not on file  Other Topics Concern  . Not on file  Social History Narrative  . Not on file   Additional Social History:    Allergies:  No Known Allergies  Labs:  Results for orders placed or performed during the hospital encounter of 02/03/19 (from the past 48 hour(s))  Comprehensive metabolic panel     Status: Abnormal   Collection Time: 02/03/19 11:53 AM  Result Value Ref Range   Sodium 137 135 - 145 mmol/L   Potassium 3.2 (L) 3.5 - 5.1 mmol/L   Chloride 98 98 - 111 mmol/L   CO2 28 22 - 32 mmol/L   Glucose, Bld 90 70 - 99 mg/dL   BUN 9 6 - 20 mg/dL   Creatinine, Ser 0.81 0.44 - 1.00 mg/dL   Calcium 9.7 8.9 - 10.3  mg/dL   Total Protein 8.1 6.5 - 8.1 g/dL   Albumin 4.2 3.5 - 5.0 g/dL   AST 14 (L) 15 - 41 U/L   ALT 9 0 - 44 U/L   Alkaline Phosphatase 54 38 - 126 U/L   Total Bilirubin 0.9 0.3 - 1.2 mg/dL   GFR calc non Af Amer >60 >60 mL/min   GFR calc Af Amer >60 >60 mL/min   Anion gap 11 5 - 15    Comment: Performed at [facility], [address] [place], [place] [address]  Ethanol     Status: None   Collection Time: 02/03/19 11:53 AM  Result Value Ref Range   Alcohol, Ethyl (B) <10 <10 mg/dL    Comment: (NOTE) Lowest detectable limit for serum alcohol is 10 mg/dL. For medical purposes only. Performed at [facility], [address] [place], [place] [id number]   Salicylate level     Status: None   Collection Time: 02/03/19 11:53 AM  Result Value Ref  Range   Salicylate Lvl [id number] 2.8 - 30.0 mg/dL    Comment: Performed at [facility], [address] [place], [place] [address]  Acetaminophen level     Status: Abnormal   Collection Time: 02/03/19 11:53 AM  Result Value Ref Range   Acetaminophen (Tylenol), Serum <10 (L) 10 - 30 ug/mL    Comment: (NOTE) Therapeutic concentrations vary significantly. A range of 10-30 ug/mL  may be an effective concentration for many patients. However, some  are best treated at concentrations outside of this range. Acetaminophen concentrations >150 ug/mL at 4 hours after ingestion  and >50 ug/mL at 12 hours after ingestion are often associated with  toxic reactions. Performed at [facility], [address] [place], [place] [address]   cbc     Status: None   Collection Time: 02/03/19 11:53 AM  Result Value Ref Range   WBC 6.3 4.0 - 10.5 K/uL   RBC 4.38 3.87 - 5.11 MIL/uL   Hemoglobin 13.6 12.0 - 15.0 g/dL   HCT 40.0 36.0 - 46.0 %   MCV 91.3 80.0 - 100.0 fL   MCH 31.1 26.0 - 34.0 pg   MCHC 34.0 30.0 - 36.0 g/dL   RDW 12.4 11.5 - 15.5 %   Platelets 226 150 - 400 K/uL   nRBC 0.0 0.0 - 0.2 %    Comment: Performed at [facility], [address] [place], [place] [address]    No current facility-administered medications for this encounter.    Current Outpatient Medications  Medication Sig Dispense Refill  . ARIPiprazole (ABILIFY) 5 MG tablet Take 5 mg by mouth at bedtime.    Marland Kitchen BANOPHEN 25 MG capsule Take 25 mg by mouth at bedtime.    . cetirizine (ZYRTEC) 10 MG tablet Take 1 tablet (10 mg total) by mouth daily as needed for allergies. (Patient taking differently: Take 10 mg by mouth daily. )    . divalproex (DEPAKOTE ER) 250 MG 24 hr tablet Take 375 mg by mouth at bedtime.    . divalproex (DEPAKOTE ER) 500 MG 24 hr tablet Take 750 mg by mouth at bedtime.    . haloperidol decanoate (HALDOL DECANOATE) 100 MG/ML injection Inject 100 mg into the muscle every 28  (twenty-eight) days.    Marland Kitchen levothyroxine (SYNTHROID, LEVOTHROID) 88 MCG tablet Take 88 mcg by mouth daily before breakfast.      Musculoskeletal: Strength & Muscle Tone: within normal limits Gait & Station: normal  Patient leans: N/A  Psychiatric Specialty Exam: Physical Exam  Nursing note and vitals reviewed. Constitutional: She appears well-developed.  HENT:  Head: Normocephalic.  Eyes: Pupils are equal, round, and reactive to light.  Neck: Normal range of motion.  Cardiovascular: Normal rate.  Respiratory: Effort normal.  Musculoskeletal: Normal range of motion.  Neurological: She is alert.  Skin: Skin is warm and dry.    Review of Systems  Psychiatric/Behavioral: Positive for hallucinations. The patient is nervous/anxious.   All other systems reviewed and are negative.   Blood pressure 131/66, pulse 86, temperature 98.6 F (37 C), temperature source Oral, resp. rate 20, SpO2 100 %.There is no height or weight on file to calculate BMI.  General Appearance: Disheveled and Guarded  Eye Contact:  Poor  Speech:  Garbled  Volume:  Decreased  Mood:  Dysphoric  Affect:  Congruent and Inappropriate  Thought Process:  Disorganized  Orientation:  Other:  Alert and oriented x1  Thought Content:  Delusions and Paranoid Ideation  Suicidal Thoughts:  No  Homicidal Thoughts:  No  Memory:  Immediate;   Unable to assess Recent;   Unable to assess Remote;   Unable to assess  Judgement:  Impaired  Insight:  Lacking  Psychomotor Activity:  Decreased  Concentration:  Concentration: Poor and Attention Span: Poor  Recall:  Poor  Fund of Knowledge:  Poor  Language:  Poor  Akathisia:  Negative  Handed:  Right  AIMS (if indicated):     Assets:  Desire for Improvement Leisure Time Social Support  ADL's:  Impaired  Cognition:  Impaired,  Severe  Sleep:   Insomnia     Treatment Plan Summary: Daily contact with patient to assess and evaluate symptoms and progress in treatment,  Medication management and Plan Patient meets criteria for psychiatric inpatient admission once a bed becomes available.  Disposition: Recommend psychiatric Inpatient admission when medically cleared. Supportive therapy provided about ongoing stressors.  Caroline Sauger, NP 02/04/2019 3:42 AM

## 2019-02-04 NOTE — ED Notes (Signed)
Pt sleeping in recliner, even regular respirations, NAD

## 2019-02-04 NOTE — ED Notes (Signed)
Pt sleeping in recliner, regular, even respirations. NAD

## 2019-02-04 NOTE — ED Notes (Signed)
Pt asleep with regular even respirations. NAD

## 2019-02-04 NOTE — ED Notes (Addendum)
Pt given a choice of po or IM meds. Refused both.

## 2019-02-04 NOTE — ED Notes (Signed)
Patient ate ham sandwich and pretzels on dinner tray. Patient drank 6 containers of cranberry juice and ate one cup of ice cream. Patient took medications with no issue.

## 2019-02-04 NOTE — ED Notes (Signed)
Tried to obtain COVID swab from patient. Patient started screaming and shaking head no. This Probation officer with ED tech unable to obtain.

## 2019-02-04 NOTE — BH Assessment (Signed)
Assessment Note  Tamara [name] is an 48 y.o. female who presents to the ER due to her mother having concerns about her current mental and emotional state. Per the report of the patient, she's been paranoid and suspicious of people, she knows to be safe and familiar with it. She's afraid to take her medications and eat food. She's not bathing or taking care of her hygiene. Her sleep has decrease and when she does go to sleep it's for a few hours, if that. Patient current behaviors stared approximately a week ago. At baseline she responds to internal stimuli but it has increase a great deal and very noticeable.  Patient currently receives outpatient treatment with [facility]. She's under the psychiatric care of Dr. Corena Pilgrim.   Diagnosis: Schizophrenia  Past Medical History:  Past Medical History:  Diagnosis Date  . GERD (gastroesophageal reflux disease)   . IUD (intrauterine device) in place IUD was placed in 2016  . Leiomyoma of uterus   . Schizophrenia ([place])   . Thyroid disease     History reviewed. No pertinent surgical history.  Family History: No family history on file.  Social History:  reports that she has never smoked. She has never used smokeless tobacco. She reports that she does not drink alcohol or use drugs.  Additional Social History:  Alcohol / Drug Use Pain Medications: See PTA Prescriptions: See PTA Over the Counter: See PTA History of alcohol / drug use?: No history of alcohol / drug abuse Longest period of sobriety (when/how long): None reported  CIWA: CIWA-Ar BP: 131/66 Pulse Rate: 86 COWS:    Allergies: No Known Allergies  Home Medications: (Not in a hospital admission)   OB/GYN Status:  No LMP recorded. (Menstrual status: Other).  General Assessment Data Location of Assessment: [facility] ED TTS Assessment: In system Is this a Tele or Face-to-Face Assessment?: Face-to-Face Is this an Initial Assessment or a Re-assessment for  this encounter?: Initial Assessment Patient Accompanied by:: Other(Mother/Legal Guardian) Language Other than English: No Living Arrangements: Other (Comment)(Private Home) What gender do you identify as?: Female Marital status: Married Pregnancy Status: No Living Arrangements: Spouse/significant other(Mother/Legal Guardian) Can pt return to current living arrangement?: Yes Admission Status: Involuntary Petitioner: ED Attending Is patient capable of signing voluntary admission?: No(Under IVC) Referral Source: Self/Family/Friend Insurance type: Medicaid  Medical Screening Exam ([place]) Medical Exam completed: Yes  Crisis Care Plan Living Arrangements: Spouse/significant other(Mother/Legal Guardian) Legal Guardian: Mother(Sandra Quentin Cornwall) Name of Psychiatrist: Dr. Vevelyn Pat Akintayo([facility]  Education Status Is patient currently in school?: No Is the patient employed, unemployed or receiving disability?: Unemployed, Receiving disability income  Risk to self with the past 6 months Suicidal Ideation: (UTA) Has patient been a risk to self within the past 6 months prior to admission? : (UTA) Suicidal Intent: (UTA) Has patient had any suicidal intent within the past 6 months prior to admission? : Yes(UTA) Is patient at risk for suicide?: (UTA) Suicidal Plan?: (UTA) Has patient had any suicidal plan within the past 6 months prior to admission? : (UTA) Access to Means: No What has been your use of drugs/alcohol within the last 12 months?: None reported Previous Attempts/Gestures: No How many times?: 0 Other Self Harm Risks: Reports none, per mother Triggers for Past Attempts: None known(Per mother) Intentional Self Injurious Behavior: None(Per mother) Family Suicide History: Unknown Recent stressful life event(s): Other (Comment)(Stop taking medications) Persecutory voices/beliefs?: (UTA) Depression: Yes Depression Symptoms: Isolating, Feeling  worthless/self pity, Tearfulness Substance abuse  history and/or treatment for substance abuse?: No Suicide prevention information given to non-admitted patients: (Per patient's mother)  Risk to Others within the past 6 months Homicidal Ideation: (UTA) Does patient have any lifetime risk of violence toward others beyond the six months prior to admission? : (UTA) Thoughts of Harm to Others: (UTA) Current Homicidal Intent: (UTA) Current Homicidal Plan: (UTA) Access to Homicidal Means: (UTA) Identified Victim: n/a History of harm to others?: No(Per patient's mother) Assessment of Violence: None Noted(Per patient's mother) Violent Behavior Description: None reported Does patient have access to weapons?: No(Per patient's mother) Criminal Charges Pending?: No(Per patient's mother) Does patient have a court date: No(Per patient's mother) Is patient on probation?: No(Per patient's mother)  Psychosis Hallucinations: Auditory, Visual(Per patient's mother) Delusions: (UTA)  Mental Status Report Appearance/Hygiene: Unremarkable, In scrubs Eye Contact: Fair Motor Activity: Freedom of movement, Unremarkable Speech: Pressured, Soft Level of Consciousness: Alert Mood: Suspicious, Anxious Affect: Fearful Anxiety Level: Moderate Thought Processes: Unable to Assess Judgement: Unable to Assess Orientation: Unable to assess Obsessive Compulsive Thoughts/Behaviors: Unable to Assess  Cognitive Functioning Concentration: Decreased(Per patient's mother) Memory: Unable to Assess Insight: Unable to Assess Impulse Control: Unable to Assess Appetite: Poor(Per patient's mother) Have you had any weight changes? : Loss(Per patient's mother) Amount of the weight change? (lbs): (Unknown) Sleep: Decreased(Per patient's mother) Total Hours of Sleep: 4 Vegetative Symptoms: Unable to Assess  ADLScreening [facility] Assessment Services) [name] cognitive ability adequate to safely complete daily activities?:  Yes Patient able to express need for assistance with ADLs?: Yes Independently performs ADLs?: Yes (appropriate for developmental age)  Prior Inpatient Therapy Prior Inpatient Therapy: Yes Prior Therapy Dates: 07/2017, 05/2016, 10/2013, 01/2013, 04/2011, 10/2010 & 07/2007 Prior Therapy Facilty/Provider(s): [facility]  Reason for Treatment: Schizophrenia  Prior Outpatient Therapy Prior Outpatient Therapy: Yes Prior Therapy Dates: Current Prior Therapy Facilty/Provider(s): [facility] Reason for Treatment: Schizophrenia(Medication Managment) Does patient have an ACCT team?: No Does patient have Intensive In-House Services?  : No Does patient have [facility] services? : No Does patient have P4CC services?: No  ADL Screening (condition at time of admission) [name] cognitive ability adequate to safely complete daily activities?: Yes Is the patient deaf or have difficulty hearing?: No Does the patient have difficulty seeing, even when wearing glasses/contacts?: No Does the patient have difficulty concentrating, remembering, or making decisions?: No Patient able to express need for assistance with ADLs?: Yes Does the patient have difficulty dressing or bathing?: No Independently performs ADLs?: Yes (appropriate for developmental age) Does the patient have difficulty walking or climbing stairs?: No Weakness of Legs: None Weakness of Arms/Hands: None  Home Assistive Devices/Equipment Home Assistive Devices/Equipment: None  Therapy Consults (therapy consults require a physician order) PT Evaluation Needed: No OT Evalulation Needed: No SLP Evaluation Needed: No Abuse/Neglect Assessment (Assessment to be complete while patient is alone) Abuse/Neglect Assessment Can Be Completed: Yes Physical Abuse: Denies Verbal Abuse: Denies Sexual Abuse: Denies Exploitation of patient/patient's resources: Denies Self-Neglect: Denies Values /  Beliefs Cultural Requests During Hospitalization: None Spiritual Requests During Hospitalization: None Consults Spiritual Care Consult Needed: No Social Work Consult Needed: No Regulatory affairs officer (For Healthcare) Does Patient Have a Medical Advance Directive?: No       Child/Adolescent Assessment Running Away Risk: Denies(Patient is an adult)  Disposition:  Disposition Initial Assessment Completed for this Encounter: Yes  On Site Evaluation by:   Reviewed with Physician:    Gunnar Fusi MS, LCAS, [facility], [place] Therapeutic Triage Specialist 02/04/2019  2:08 AM

## 2019-02-04 NOTE — ED Notes (Signed)
Unable to assess patient due to patient sleeping in recliner. Respirations even and non-labored.

## 2019-02-04 NOTE — ED Notes (Signed)
Pt sitting in recliner in room with even regular respirations, no distress and seems to be sleeping

## 2019-02-04 NOTE — ED Notes (Signed)
Pt refusing to eat or drink. Per report pt has not eaten or drank anything since coming to ED. Pt encouraged to drink and has been offered different drinks. Pt will hold cup but does not drink. Pt has not had any documented urine output and lips, oral mucosa are dry. Per report pt did not sleep last night and is awake and up in room today. Pt refusing to allow VS or lab work to be done. Pt refusing to shower or participate with any hygiene. Dr Ellender Hose notified and to bedside.

## 2019-02-04 NOTE — ED Notes (Signed)
Patient currently sleeping in recliner.

## 2019-02-04 NOTE — ED Notes (Signed)
Pt sleeping in recliner with even regular respirations, NAD

## 2019-02-04 NOTE — ED Notes (Signed)
Patient refusing to lay down on bed and try to rest. Patient redirected by multiple staff.

## 2019-02-04 NOTE — ED Notes (Signed)
IVC / Consult completed/ Pending Placement 

## 2019-02-04 NOTE — ED Provider Notes (Signed)
Patient seen by myself.  I also discussed with nursing and nurse aide, who know the patient well.  The patient appears disorganized with thought blocking, responding to internal stimuli, and symptoms consistent with significant negative symptoms of schizophrenia.  She reportedly is not bathing or sleeping.  I suspect she may need inpatient admission for stabilization.   Duffy Bruce, MD 02/04/19 [phone]

## 2019-02-04 NOTE — ED Notes (Signed)
Pt in hall looking at something move around walls that I can not see. When asked what she doing her response is unintelligible. Pt does not seem to be in distress at this time. Pt assisted to room with tech.

## 2019-02-04 NOTE — ED Notes (Signed)
IM given with verbal protest from pt. Pt was not physical during administration and security was not needed.

## 2019-02-04 NOTE — ED Notes (Signed)
Patient in room sitting in a recliner at this time.

## 2019-02-05 DIAGNOSIS — F203 Undifferentiated schizophrenia: Secondary | ICD-10-CM

## 2019-02-05 MED ORDER — DIVALPROEX SODIUM ER 250 MG PO TB24
500.0000 mg | ORAL_TABLET | Freq: Two times a day (BID) | ORAL | Status: DC
  Administered 2019-02-05 – 2019-02-06 (×3): 500 mg via ORAL
  Filled 2019-02-05 (×3): qty 2

## 2019-02-05 MED ORDER — DIVALPROEX SODIUM ER 250 MG PO TB24: 375.0000 mg | ORAL_TABLET | Freq: Every day | ORAL | Status: DC

## 2019-02-05 MED ORDER — DIVALPROEX SODIUM ER 250 MG PO TB24: 750.0000 mg | ORAL_TABLET | Freq: Every day | ORAL | Status: DC

## 2019-02-05 MED ORDER — LEVOTHYROXINE SODIUM 88 MCG PO TABS
88.0000 ug | ORAL_TABLET | Freq: Every day | ORAL | Status: DC
  Administered 2019-02-06: 88 ug via ORAL
  Filled 2019-02-05: qty 1

## 2019-02-05 MED ORDER — DIPHENHYDRAMINE HCL 25 MG PO CAPS: 25.0000 mg | ORAL_CAPSULE | Freq: Every day | ORAL | Status: DC | PRN

## 2019-02-05 MED ORDER — DIPHENHYDRAMINE HCL 25 MG PO CAPS: 25.0000 mg | ORAL_CAPSULE | Freq: Every day | ORAL | Status: DC

## 2019-02-05 NOTE — ED Notes (Signed)
Nurse held the [place], due to Patient being groggy, nurse talked with MD about not giving and He agreed, Patient is cooperative, sluggish to respond, speaks very soft, hard to understand, Nurse will continue to monitor. No signs of distress at this time.

## 2019-02-05 NOTE — Consult Note (Addendum)
[place] [facility] Consult   Reason for Consult: Psychiatric evaluation Referring Physician: Dr. Rip Harbour Patient Identification: Tamara [name] MRN:  [id number] Principal Diagnosis: Schizophrenia ([place]) Diagnosis:  Principal Problem:   Schizophrenia ([place]) Active Problems:   GERD (gastroesophageal reflux disease)   Seborrheic dermatitis   Mild intellectual disability   Congenital hypothyroidism without goiter   Psychogenic polydipsia  Total Time spent with patient: 30 minutes  Subjective:   Tamara [name] is a 48 y.o. female patient stopped taking her medications and became psychotic with paranoia.  She was not eating, drinking, or showering.  History of autism and schizophrenia. Forced medications given in ED initially and she seems at her baseline.  She wants to go home, need to evaluate for 24 hours to assure stabilization.  Patient seen and evaluated in person by this provider.  She was lying in bed earlier this morning sleeping.  Took her medications by mouth from the nurse without issues.  She sat up later for this provider after she denied suicidal/homicidal ideations, hallucinations.  Asked if she wanted to go home and she sat up like she was ready to go.  Explained to her she needed to eat and shower, then we would consider letting her return home tomorrow.  She lives with her mother and she can return home.  This provider set up her lunch and she was eating without issues.  Will make sure she showers today.    02/04/19: Presented to [facility] ED via POV involuntary. The patient's mom discussed that the patient has not been taking her medication for a month. She stated the patient's last hospitalization was in 2019, and the patient was hospitalized in [place], which she remained there for three months.  Mom confirmed the patient psychiatrist for her outpatient treatment is with [place] [facility]. She's under the psychiatric care of Dr. Corena Pilgrim  [phone]).  Mom confirmed the patient has not been sleeping nor eating and has not cared for her hygiene.  The patient was seen face-to-face by this provider; the chart reviewed and consulted with Dr. Jari Pigg on 02/03/2019 due to the patient's care. It was discussed with the EDP the patient does meet the criteria to be admitted to the psychiatric inpatient unit.  The patient is alert and oriented x 1, anxious, disorganized, and mood-congruent with affect on evaluation. The patient does appear to be responding to internal and external stimuli.  She is presenting with delusional thinking. The patient's mom confirmed that she is experiencing auditory and visual hallucinations. The patient denies suicidal, homicidal, or self-harm ideations. The patient is presenting with psychotic and paranoia behaviors. During an encounter with the patient, she was unable to answer most questions appropriately.  Collateral was obtained by the mother Tamara [name] [id number]), who expresses concerns about the patient's psychotic behaviors. Tamara [name] disclosed that the patient ran away from home today (02/03/2019) and the patient's sister had to go looking for her.  She stated when they were unable to locate her; she called the police.  She said the patient walked off, and she almost got hit by a car.  She voiced the patient presents to be afraid of everyone and everything.  She declared she is unable to provide care to the patient when she gets into this psychotic state.   HPI: Per Dr. Rip Harbour; Tamara [name] is a 48 y.o. female patient brought in by her mother.  Mother reports she has been taking her medicines but for the last 3 weeks has  not been wanting to bathe or change her clothes.  She seems to be getting more paranoid.  Here in the emergency room the patient appears to be very scared not answering questions possibly responding to internal stimuli still difficult to tell.  Past Psychiatric History:   Schizophrenia  Risk to Self: Suicidal Ideation: (UTA) Suicidal Intent: (UTA) Is patient at risk for suicide?: (UTA) Suicidal Plan?: (UTA) Access to Means: No What has been your use of drugs/alcohol within the last 12 months?: None reported How many times?: 0 Other Self Harm Risks: Reports none, per mother Triggers for Past Attempts: None known(Per mother) Intentional Self Injurious Behavior: None(Per mother) Risk to Others: Homicidal Ideation: (UTA) Thoughts of Harm to Others: (UTA) Current Homicidal Intent: (UTA) Current Homicidal Plan: (UTA) Access to Homicidal Means: (UTA) Identified Victim: n/a History of harm to others?: No(Per patient's mother) Assessment of Violence: None Noted(Per patient's mother) Violent Behavior Description: None reported Does patient have access to weapons?: No(Per patient's mother) Criminal Charges Pending?: No(Per patient's mother) Does patient have a court date: No(Per patient's mother) Prior Inpatient Therapy: Prior Inpatient Therapy: Yes Prior Therapy Dates: 07/2017, 05/2016, 10/2013, 01/2013, 04/2011, 10/2010 & 07/2007 Prior Therapy Facilty/Provider(s): [facility]  Reason for Treatment: Schizophrenia Prior Outpatient Therapy: Prior Outpatient Therapy: Yes Prior Therapy Dates: Current Prior Therapy Facilty/Provider(s): [facility] Reason for Treatment: Schizophrenia(Medication [place]) Does patient have an ACCT team?: No Does patient have Intensive In-House Services?  : No Does patient have [facility] services? : No Does patient have P4CC services?: No  Past Medical History:  Past Medical History:  Diagnosis Date  . GERD (gastroesophageal reflux disease)   . IUD (intrauterine device) in place IUD was placed in 2016  . Leiomyoma of uterus   . Schizophrenia ([place])   . Thyroid disease    History reviewed. No pertinent surgical history. Family History: History reviewed. No pertinent family  history. Family Psychiatric  History: No pertinent family psychiatric history Social History:  Social History   Substance and Sexual Activity  Alcohol Use No     Social History   Substance and Sexual Activity  Drug Use No    Social History   Socioeconomic History  . Marital status: Single    Spouse name: Not on file  . Number of children: Not on file  . Years of education: Not on file  . Highest education level: Not on file  Occupational History  . Not on file  Social Needs  . Financial resource strain: Not on file  . Food insecurity    Worry: Not on file    Inability: Not on file  . Transportation needs    Medical: Not on file    Non-medical: Not on file  Tobacco Use  . Smoking status: Never Smoker  . Smokeless tobacco: Never Used  Substance and Sexual Activity  . Alcohol use: No  . Drug use: No  . Sexual activity: Yes    Birth control/protection: Surgical  Lifestyle  . Physical activity    Days per week: Not on file    Minutes per session: Not on file  . Stress: Not on file  Relationships  . Social Herbalist on phone: Not on file    Gets together: Not on file    Attends religious service: Not on file    Active member of club or organization: Not on file    Attends meetings of clubs or organizations: Not on  file    Relationship status: Not on file  Other Topics Concern  . Not on file  Social History Narrative  . Not on file   Additional Social History:    Allergies:  No Known Allergies  Labs:  No results found for this or any previous visit (from the past 48 hour(s)).  Current Facility-Administered Medications  Medication Dose Route Frequency Provider Last Rate Last Dose  . haloperidol (HALDOL) tablet 5 mg  5 mg Oral BID Patrecia Pour, NP   5 mg at 02/05/19 [id number]   Or  . haloperidol lactate (HALDOL) injection 5 mg  5 mg Intramuscular BID Patrecia Pour, NP   5 mg at 02/04/19 1115  . LORazepam (ATIVAN) tablet 2 mg  2 mg Oral BID Patrecia Pour, NP   2 mg at 02/04/19 2114   Or  . LORazepam (ATIVAN) injection 2 mg  2 mg Intramuscular BID Patrecia Pour, NP   2 mg at 02/04/19 1115   Current Outpatient Medications  Medication Sig Dispense Refill  . ARIPiprazole (ABILIFY) 5 MG tablet Take 5 mg by mouth at bedtime.    Marland Kitchen BANOPHEN 25 MG capsule Take 25 mg by mouth at bedtime.    . cetirizine (ZYRTEC) 10 MG tablet Take 1 tablet (10 mg total) by mouth daily as needed for allergies. (Patient taking differently: Take 10 mg by mouth daily. )    . divalproex (DEPAKOTE ER) 250 MG 24 hr tablet Take 375 mg by mouth at bedtime.    . divalproex (DEPAKOTE ER) 500 MG 24 hr tablet Take 750 mg by mouth at bedtime.    . haloperidol decanoate (HALDOL DECANOATE) 100 MG/ML injection Inject 100 mg into the muscle every 28 (twenty-eight) days.    Marland Kitchen levothyroxine (SYNTHROID, LEVOTHROID) 88 MCG tablet Take 88 mcg by mouth daily before breakfast.      Musculoskeletal: Strength & Muscle Tone: within normal limits Gait & Station: normal Patient leans: N/A  Psychiatric Specialty Exam: Physical Exam  Nursing note and vitals reviewed. Constitutional: She appears well-developed.  HENT:  Head: Normocephalic.  Eyes: Pupils are equal, round, and reactive to light.  Neck: Normal range of motion.  Cardiovascular: Normal rate.  Respiratory: Effort normal.  Musculoskeletal: Normal range of motion.  Neurological: She is alert.  Skin: Skin is warm and dry.    Review of Systems  Psychiatric/Behavioral: Positive for hallucinations. The patient is nervous/anxious.   All other systems reviewed and are negative.   Blood pressure 113/84, pulse 86, temperature 98.2 F (36.8 C), temperature source Oral, resp. rate 18, SpO2 100 %.There is no height or weight on file to calculate BMI.  General Appearance: Casual  Eye Contact:  Good  Speech:  Normal  Volume:  Normal  Mood:  Anxious at times  Affect:  Congruent and Inappropriate  Thought Process:  Logical   Orientation:  Other:  Alert and oriented x1  Thought Content:  WDL  Suicidal Thoughts:  No  Homicidal Thoughts:  No  Memory:  Fair  Judgement:  Fair   Insight:  Lacking  Psychomotor Activity:  Decreased  Concentration:  Fair  Recall:  [place] of Knowledge:  Poor to fair  Language:  Fair  Akathisia:  Negative  Handed:  Right  AIMS (if indicated):     Assets:  Desire for Improvement Leisure Time Social Support  ADL's:  Impaired  Cognition:  Impaired, moderate  Sleep:   Insomnia    Treatment Plan Summary: Schizophrenia: -  Continue Haldol 5 mg BID -Restarted Depakote at 500 mg BID  Anxiety/catatonia: -Restarted Ativan 2 mg BID  Insomnia: -Restarted benadryl 25 mg at bedtime PRN  Disposition: Reevaluate in the am for stability to return home  Waylan Boga, NP 02/05/2019 2:10 PM   Case discussed and reviewed with Dr. Reita Cliche.  Agree with plan as outlined above.

## 2019-02-05 NOTE — ED Provider Notes (Signed)
-----------------------------------------   6:20 AM on 02/05/2019 -----------------------------------------   Blood pressure 131/66, pulse 86, temperature 98.6 F (37 C), temperature source Oral, resp. rate 20, SpO2 100 %.  The patient is calm and cooperative at this time.  There have been no acute events since the last update.  Awaiting disposition plan from Behavioral Medicine and/or Social Work team(s).   Paulette Blanch, MD 02/05/19 [phone]

## 2019-02-05 NOTE — BH Assessment (Signed)
Patient referral information faxed to:   [facility]

## 2019-02-05 NOTE — ED Notes (Signed)
Pt eating lunch tray  

## 2019-02-05 NOTE — BH Assessment (Signed)
Patient referral information faxed to:   [facility]     [facility]

## 2019-02-05 NOTE — ED Notes (Signed)
Patient ate a few bites of her supper and had a beverage, will continue to monitor, she denies Si/hi or avh. Patient is safe, q 15 minute checks for safety.

## 2019-02-05 NOTE — ED Notes (Addendum)
Pt ate 1/4 sandwich (sub), 1/2 bag chips, 1/2 banana, and 3 containers of juice.

## 2019-02-05 NOTE — ED Notes (Signed)
pt sleeping soundly; even and unlabored respirations, will continue to monitor

## 2019-02-05 NOTE — ED Notes (Signed)
Patient able to take PO meds with applesauce. Patient is pleasant and calm at this time. Warm blanket given per request. Primary nurse notified.

## 2019-02-06 MED ORDER — HALOPERIDOL 5 MG PO TABS: 5.0000 mg | ORAL_TABLET | Freq: Two times a day (BID) | ORAL | 1 refills | Status: DC | PRN

## 2019-02-06 MED ORDER — LORAZEPAM 1 MG PO TABS: 1.0000 mg | ORAL_TABLET | Freq: Two times a day (BID) | ORAL | 0 refills | Status: DC | PRN

## 2019-02-06 MED ORDER — LORAZEPAM 1 MG PO TABS: 1.0000 mg | ORAL_TABLET | Freq: Two times a day (BID) | ORAL | Status: DC | PRN

## 2019-02-06 MED ORDER — DIVALPROEX SODIUM ER 500 MG PO TB24: 500.0000 mg | ORAL_TABLET | Freq: Two times a day (BID) | ORAL | 1 refills | Status: DC

## 2019-02-06 MED ORDER — DIVALPROEX SODIUM ER 500 MG PO TB24: 500.0000 mg | ORAL_TABLET | Freq: Two times a day (BID) | ORAL | 0 refills | Status: DC

## 2019-02-06 MED ORDER — HALOPERIDOL 5 MG PO TABS: 5.0000 mg | ORAL_TABLET | Freq: Two times a day (BID) | ORAL | 0 refills | Status: DC | PRN

## 2019-02-06 NOTE — Consult Note (Addendum)
[facility] ED Discharge  02/06/2019 9:39 AM Tamara [name]  MRN:  [id number] Principal Problem: Schizophrenia [facility]) Discharge Diagnoses: Principal Problem:   Schizophrenia ([place]) Active Problems:   GERD (gastroesophageal reflux disease)   Seborrheic dermatitis   Mild intellectual disability   Congenital hypothyroidism without goiter   Psychogenic polydipsia  Subjective: "Can I go home?"  Patient is pleasant and cooperative, no distress.  Denies paranoia.  Easily prompted to eat and follows directions without issues.  Patient seen and evaluated by this provider, history of schizophrenia.  Stopped taking her medications and decompensated, became psychotic with paranoia.  Forced medications were restarted in the ED and patient stabilized.  She is taking her medications willingly since yesterday morning.  Eats when prompted.  She will shower today and mother is willing to come and get her.  No psychosis, suicidal/homicidal ideations, hallucinations, or withdrawal symptoms.  Total Time spent with patient: 30 minutes  Past Psychiatric History: schizophrenia, IDD  Past Medical History:  Past Medical History:  Diagnosis Date  . GERD (gastroesophageal reflux disease)   . IUD (intrauterine device) in place IUD was placed in 2016  . Leiomyoma of uterus   . Schizophrenia ([place])   . Thyroid disease    History reviewed. No pertinent surgical history. Family History: History reviewed. No pertinent family history. Family Psychiatric  History: none Social History:  Social History   Substance and Sexual Activity  Alcohol Use No     Social History   Substance and Sexual Activity  Drug Use No    Social History   Socioeconomic History  . Marital status: Single    Spouse name: Not on file  . Number of children: Not on file  . Years of education: Not on file  . Highest education level: Not on file  Occupational History  . Not on file  Social Needs  . Financial resource strain: Not on  file  . Food insecurity    Worry: Not on file    Inability: Not on file  . Transportation needs    Medical: Not on file    Non-medical: Not on file  Tobacco Use  . Smoking status: Never Smoker  . Smokeless tobacco: Never Used  Substance and Sexual Activity  . Alcohol use: No  . Drug use: No  . Sexual activity: Yes    Birth control/protection: Surgical  Lifestyle  . Physical activity    Days per week: Not on file    Minutes per session: Not on file  . Stress: Not on file  Relationships  . Social Herbalist on phone: Not on file    Gets together: Not on file    Attends religious service: Not on file    Active member of club or organization: Not on file    Attends meetings of clubs or organizations: Not on file    Relationship status: Not on file  Other Topics Concern  . Not on file  Social History Narrative  . Not on file    Has this patient used any form of tobacco in the last 30 days? (Cigarettes, Smokeless Tobacco, Cigars, and/or Pipes) NA  Current Medications: Current Facility-Administered Medications  Medication Dose Route Frequency Provider Last Rate Last Dose  . diphenhydrAMINE (BENADRYL) capsule 25 mg  25 mg Oral Daily PRN Patrecia Pour, NP      . divalproex (DEPAKOTE ER) 24 hr tablet 500 mg  500 mg Oral Q12H Lord, Asa Saunas, NP   500  mg at 02/05/19 2129  . haloperidol (HALDOL) tablet 5 mg  5 mg Oral BID Patrecia Pour, NP   5 mg at 02/05/19 2128   Or  . haloperidol lactate (HALDOL) injection 5 mg  5 mg Intramuscular BID Patrecia Pour, NP   5 mg at 02/04/19 1115  . levothyroxine (SYNTHROID) tablet 88 mcg  88 mcg Oral QAC breakfast Patrecia Pour, NP   88 mcg at 02/06/19 0804  . LORazepam (ATIVAN) tablet 2 mg  2 mg Oral BID Patrecia Pour, NP   2 mg at 02/05/19 2128   Or  . LORazepam (ATIVAN) injection 2 mg  2 mg Intramuscular BID Patrecia Pour, NP   2 mg at 02/04/19 1115   Current Outpatient Medications  Medication Sig Dispense Refill  .  ARIPiprazole (ABILIFY) 5 MG tablet Take 5 mg by mouth at bedtime.    Marland Kitchen BANOPHEN 25 MG capsule Take 25 mg by mouth at bedtime.    . cetirizine (ZYRTEC) 10 MG tablet Take 1 tablet (10 mg total) by mouth daily as needed for allergies. (Patient taking differently: Take 10 mg by mouth daily. )    . divalproex (DEPAKOTE ER) 250 MG 24 hr tablet Take 375 mg by mouth at bedtime.    . divalproex (DEPAKOTE ER) 500 MG 24 hr tablet Take 750 mg by mouth at bedtime.    . haloperidol decanoate (HALDOL DECANOATE) 100 MG/ML injection Inject 100 mg into the muscle every 28 (twenty-eight) days.    Marland Kitchen levothyroxine (SYNTHROID, LEVOTHROID) 88 MCG tablet Take 88 mcg by mouth daily before breakfast.     PTA Medications: (Not in a hospital admission)   Musculoskeletal: Strength & Muscle Tone: within normal limits Gait & Station: normal Patient leans: N/A  Psychiatric Specialty Exam: Physical Exam  Constitutional: She is oriented to person, place, and time. She appears well-developed and well-nourished.  HENT:  Head: Normocephalic.  Neck: Normal range of motion.  Respiratory: Effort normal.  Musculoskeletal: Normal range of motion.  Neurological: She is alert and oriented to person, place, and time.  Psychiatric: Her speech is normal and behavior is normal. Judgment and thought content normal. Her mood appears anxious. Her affect is blunt. Cognition and memory are impaired.    Review of Systems  Psychiatric/Behavioral: The patient is nervous/anxious.   All other systems reviewed and are negative.   Blood pressure 116/67, pulse 71, temperature 98.3 F (36.8 C), temperature source Oral, resp. rate 16, SpO2 99 %.There is no height or weight on file to calculate BMI.  General Appearance: Casual  Eye Contact:  Good  Speech:  Normal Rate  Volume:  Normal  Mood:  Anxious  Affect:  Blunt  Thought Process:  Coherent and Descriptions of Associations: Intact  Orientation:  Full (Time, Place, and Person)  Thought  Content:  WDL and Logical  Suicidal Thoughts:  No  Homicidal Thoughts:  No  Memory:  Immediate;   Fair Recent;   Fair Remote;   Fair  Judgement:  Fair  Insight:  Fair  Psychomotor Activity:  Normal  Concentration:  Concentration: Fair and Attention Span: Fair  Recall:  [employer] of Knowledge:  Fair  Language:  Good  Akathisia:  No  Handed:  Right  AIMS (if indicated):     Assets:  Housing Leisure Time Physical Health Resilience Social Support  ADL's:  Impaired, needs prompting  Cognition:  Impaired,  Mild  Sleep:        Demographic Factors:  NA  Loss Factors: NA  Historical Factors: Impulsivity  Risk Reduction Factors:   Sense of responsibility to family, Living with another person, especially a relative, Positive social support and Positive therapeutic relationship  Continued Clinical Symptoms:  Anxiety  Cognitive Features That Contribute To Risk:  None    Suicide Risk:  Minimal: No identifiable suicidal ideation.  Patients presenting with no risk factors but with morbid ruminations; may be classified as minimal risk based on the severity of the depressive symptoms   Plan Of Care/Follow-up recommendations:  Schizophrenia, undifferentiated: -Continue Haldol 5 mg BID -Continued Depakote at 500 mg BID  Anxiety/catatonia: -Continued Ativan 2 mg BID  Insomnia: -Continued benadryl 25 mg at bedtime PRN  Activity:  as tolerated Diet:  heart healthy diet  Disposition: discharge home Waylan Boga, NP 02/06/2019, 9:39 AM   Case discussed, plan agreed upon as discussed with Dr. Reita Cliche.

## 2019-02-06 NOTE — Discharge Instructions (Signed)
Supporting Someone With Schizophrenia If you are a friend or family member of someone diagnosed with schizophrenia, you can play a helpful role in that person's life. Understanding as much as you can about schizophrenia will help you to be as supportive as possible. What do I need to know about this condition? Schizophrenia is a lifelong mental illness that causes episodes of severe loss of contact with reality (psychosis). It may interfere with personal relationships or normal daily activities. Symptoms of schizophrenia may be continuous, or they may come and go. Some common symptoms include:  Seeing, hearing, or feeling things that are not there (hallucinations).  Having fixed, false beliefs (delusions). These often involve a person believing that he or she is being attacked, harassed, cheated, persecuted, or plotted against.  Speech or behavior that is odd or disorganized or does not make sense to others (incoherent). What do I need to know about the treatment options? Schizophrenia requires long-term treatment. Treatment may include:  Medicines, such as antipsychotics. Other medicines may be added depending on the type and severity of the person's symptoms.  Counseling or talk therapy. Individual, group, or family counseling may be helpful in providing education, support, and guidance. Many people with schizophrenia also benefit from social skills and job skills (vocational) training.  Electroconvulsive therapy (ECT). This procedure involves applying short electrical pulses to the brain through the scalp to change brain chemicals (neurotransmitters). ECT may be used to treat people with severe symptoms if other treatments are not effective. A combination of medicine and counseling is usually best for managing this disorder over time. How can I support my loved one? Talk about the condition Good communication can be helpful in supporting your friend or family member. Here are a few things to  keep in mind:  Be careful about too much prodding. Try not to overdo reminders to an adult friend or family member about things like taking medicines. Ask how your loved one prefers that you help.  Be patient, listen well, and speak encouraging words.  Be available if your loved one wants to talk. Make an effort to acknowledge his or her feelings. Find support and resources A health care provider may be able to recommend mental health resources that are available online or over the phone. You could start with:  Substance Abuse and Mental Health Services Administration [facility]): ktimeonline.com  National Alliance on Mental Illness (NAMI): www.nami.org Think about joining self-help and support groups, not only for your friend or family member, but also for yourself. People in these peer and family support groups understand what you and your loved one are going through. They can help you feel a sense of hope and connect you with local resources to help you learn more. You could also try family or couples therapy. General support  Make an effort to learn all you can about schizophrenia.  Ask your loved one how you can best help him or her. Offer to help with daily responsibilities, such as laundry or meals.  Help your loved one follow his or her treatment plan as directed by health care providers. This could mean driving him or her to therapy sessions or suggesting ways to cope with stress, such as going for a walk together.  Do not argue with your loved one about his or her perception of reality. How can I create a safe environment?  Create a written crisis plan. Include important phone numbers, such as the local crisis intervention team. Make sure that: ? The person  with schizophrenia knows about this plan. ? Everyone who has regular contact with that person knows about the plan and knows what to do in an emergency.  Be aware that sometimes there is a risk of violence toward you when a  person has schizophrenia. Set limits when it comes to physical or verbal abuse. One thing you could do is tell your loved one that if he or she is getting violent, you will leave and then call the police. Talk ahead of time about your plans for handling violent situations. Doing that will help to make violent episodes easier to manage if they happen.  To lower the risk of violence or suicide during a crisis, remove or lock up guns and other weapons. If you do not have a safe place to keep a gun, local law enforcement may store a gun for you. How should I care for myself? Supporting someone with schizophrenia can cause stress. Be sure to find ways to take care of your body, mind, and well-being.  Find someone you can talk to who will also help you develop coping skills to manage stress.  Try different techniques for coping with stress, such as: ? Muscle relaxation, meditation, and breathing exercises. ? Exercise, even if it is just taking a short walk a few times a week. ? Getting the right amount of good-quality sleep.  Try to maintain your normal routines. This can help you remember that your life is about more than your loved one's condition.  Make time for activities that help you relax, and try to not feel guilty about taking time for yourself. What are some signs that the condition is getting worse? Warning signs that your loved one's condition is getting worse include:  Clear, sudden change in behavior or symptoms.  Struggling with daily activities.  Increased use of drugs or alcohol.  Not being able to fulfill basic needs (food, clothing, shelter). Warning signs that your loved one is thinking about suicide include:  Withdrawing from friends and family.  Talking about suicide or searching for methods.  Expressing feelings of being trapped or not having a purpose in life. Ask a counselor or your loved one's health care provider about when to get help if you are concerned about  behavior changes. Privacy laws limit how much a health care provider can share with you without your loved one's permission, but if you feel that a situation is an emergency, do not wait to call a health care provider or emergency services. Get help right away if:  You are in a situation that threatens your life. Leave the situation and call emergency services (911 in the [place] as soon as possible. If you ever feel like your loved one may hurt himself or herself or others, or may have thoughts about taking his or her own life, get help right away. You can go to your nearest emergency department or call:  Your local emergency services (911 in the [place]  A suicide crisis helpline, such as the [facility] at (518) 013-3886. This is open 24 hours a day. Summary  Schizophrenia is a lifelong illness that causes episodes of severe loss of contact with reality (psychosis).  Treatment for schizophrenia is ongoing, and it may include medicines, counseling, therapy, support groups, and social skills or job Company secretary.  Support that you give to your loved one is an important part of a successful treatment plan.  Make sure to take care of yourself while supporting someone with  schizophrenia. Get help when you need it.  Expect episodes, and have a plan for how to help your loved one manage them. If you feel threatened or are worried about your loved one's safety, contact your local emergency services as soon as possible. This information is not intended to replace advice given to you by your health care provider. Make sure you discuss any questions you have with your health care provider. Document Released: 09/23/2016 Document Revised: 09/02/2018 Document Reviewed: 09/23/2016 Elsevier Patient Education  2020 [employer].

## 2019-02-06 NOTE — ED Notes (Signed)
Pt showered with this RN and Elmyra Ricks, NT. Pt given new blue scrubs for d/c.

## 2019-02-06 NOTE — ED Provider Notes (Signed)
-----------------------------------------   7:37 AM on 02/06/2019 -----------------------------------------   Blood pressure 116/67, pulse 71, temperature 98.3 F (36.8 C), temperature source Oral, resp. rate 16, SpO2 99 %.  The patient is calm and cooperative at this time.  There have been no acute events since the last update.  Awaiting disposition plan from Behavioral Medicine and/or Social Work team(s).    Merlyn Lot, MD 02/06/19 [phone]

## 2019-02-06 NOTE — ED Notes (Signed)
Pt came to the door. This tech walked up to pt and said, "Tamara [name], let go get you a shower before you go home", patient responded "No" I already had two showers. This tech then told PT, Well come on, let get you bathed one more time before you go home." Pt responded "No" I  Have already had 2 showers, and walked back into her room mumbling. This tech informed the RN Maudie Mercury of the interaction.

## 2019-02-06 NOTE — ED Notes (Signed)
Pt informed that after she took her medicine it was time to take a shower. Pt took her medication without difficulty but refused the opportunity to take a shower stating that she had already had two showers this morning and she didn't need another one. Pt stated she is ready to go home. This RN informed the patient that she would be going home soon and approached the pt in a different way asking if she'd like to take another shower before she goes home and again the patient refused stating that she had already had two showers this morning.

## 2019-02-07 NOTE — BH Assessment (Signed)
Writer spoke with patient to complete and updated/reassessment. Patient has improved and doing well. She's eating and took a shower. Patient asking to go home.

## 2019-02-17 ENCOUNTER — Other Ambulatory Visit: Payer: Self-pay

## 2019-02-17 ENCOUNTER — Encounter: Payer: Self-pay | Admitting: Emergency Medicine

## 2019-02-17 ENCOUNTER — Emergency Department
Admission: EM | Admit: 2019-02-17 | Discharge: 2019-02-19 | Disposition: A | Discharge status: Home / Self Care | Payer: Medicaid Other | Attending: Emergency Medicine | Admitting: Emergency Medicine

## 2019-02-17 DIAGNOSIS — Z975 Presence of (intrauterine) contraceptive device: Secondary | ICD-10-CM | POA: Diagnosis not present

## 2019-02-17 DIAGNOSIS — Z20828 Contact with and (suspected) exposure to other viral communicable diseases: Secondary | ICD-10-CM | POA: Insufficient documentation

## 2019-02-17 DIAGNOSIS — F201 Disorganized schizophrenia: Secondary | ICD-10-CM | POA: Insufficient documentation

## 2019-02-17 DIAGNOSIS — Z79899 Other long term (current) drug therapy: Secondary | ICD-10-CM | POA: Insufficient documentation

## 2019-02-17 DIAGNOSIS — F200 Paranoid schizophrenia: Secondary | ICD-10-CM | POA: Diagnosis present

## 2019-02-17 DIAGNOSIS — R440 Auditory hallucinations: Secondary | ICD-10-CM | POA: Diagnosis present

## 2019-02-17 LAB — COMPREHENSIVE METABOLIC PANEL
ALT: 11 U/L (ref 0–44)
AST: 20 U/L (ref 15–41)
Albumin: 4.3 g/dL (ref 3.5–5.0)
Alkaline Phosphatase: 65 U/L (ref 38–126)
Anion gap: 13 (ref 5–15)
BUN: 8 mg/dL (ref 6–20)
CO2: 28 mmol/L (ref 22–32)
Calcium: 9.9 mg/dL (ref 8.9–10.3)
Chloride: 99 mmol/L (ref 98–111)
Creatinine, Ser: 0.67 mg/dL (ref 0.44–1.00)
GFR calc Af Amer: >60 mL/min (ref >60)
GFR calc non Af Amer: >60 mL/min (ref >60)
Glucose, Bld: 103 mg/dL — ABNORMAL HIGH (ref 70–99)
Potassium: 3 mmol/L — ABNORMAL LOW (ref 3.5–5.1)
Sodium: 140 mmol/L (ref 135–145)
Total Bilirubin: 0.6 mg/dL (ref 0.3–1.2)
Total Protein: 8.6 g/dL — ABNORMAL HIGH (ref 6.5–8.1)

## 2019-02-17 LAB — CBC
HCT: 41.8 % (ref 36.0–46.0)
Hemoglobin: 14.3 g/dL (ref 12.0–15.0)
MCH: 31.4 pg (ref 26.0–34.0)
MCHC: 34.2 g/dL (ref 30.0–36.0)
MCV: 91.9 fL (ref 80.0–100.0)
Platelets: 332 10*3/uL (ref 150–400)
RBC: 4.55 MIL/uL (ref 3.87–5.11)
RDW: 12.7 % (ref 11.5–15.5)
WBC: 8.1 10*3/uL (ref 4.0–10.5)
nRBC: 0 % (ref 0.0–0.2)

## 2019-02-17 LAB — SALICYLATE LEVEL: Salicylate Lvl: <7 mg/dL (ref 2.8–30.0)

## 2019-02-17 LAB — ETHANOL: Alcohol, Ethyl (B): <10 mg/dL (ref <10)

## 2019-02-17 LAB — SARS CORONAVIRUS 2 BY RT PCR (HOSPITAL ORDER, PERFORMED IN ~~LOC~~ HOSPITAL LAB): SARS Coronavirus 2: NEGATIVE

## 2019-02-17 LAB — ACETAMINOPHEN LEVEL: Acetaminophen (Tylenol), Serum: <10 ug/mL — ABNORMAL LOW (ref 10–30)

## 2019-02-17 NOTE — ED Notes (Signed)
VOL  

## 2019-02-17 NOTE — ED Notes (Signed)
This tech dressed out pt. Pt belongings consisted of blue sweatshirt, white underwear, gray sports bra, red/white shirt, black/brown fur boots, gray pants and a black mask.

## 2019-02-17 NOTE — ED Triage Notes (Signed)
Patient ambulatory to triage with steady gait, without difficulty or distress noted, mask in place; pt cooperative but slow to respond to verbal commands and does not answer direct questions; mumbling speech; per pt's mother, pt is not eating, not bathing, hearing voices and hallucinating with paranoia and uncooperative at home; taking meds as rx; mother is also concerned because pt has IUD in place and she will not allow here PCP to check it; pt is seen at [facility]

## 2019-02-17 NOTE — ED Notes (Signed)
Pt brought into ED BHU via sally port and wand with metal detector for safety by Animal nutritionist. Patient oriented to unit/care area: Pt informed of unit policies and procedures.  Informed that, for their safety, care areas are designed for safety and monitored by security cameras at all times. Pt shown to their room.

## 2019-02-17 NOTE — ED Notes (Signed)
Patient alert and oriented, warm and dry, in no acute distress. UTA SI, HI, AVH and pain. Patient made aware of Q15 minute rounds and Engineer, drilling presence for their safety. Patient instructed to come to me with needs or concerns.

## 2019-02-17 NOTE — ED Notes (Signed)
Report received from [facility]. Patient care assumed. Patient/RN introduction complete. Will continue to monitor. Pt in room calm and cooperative at this time with no complaints. Pt awaiting psych consult and disposition.

## 2019-02-17 NOTE — ED Provider Notes (Signed)
[facility] Emergency Department Provider Note   ____________________________________________   First MD Initiated Contact with Patient 02/17/19 2201     (approximate)  I have reviewed the triage vital signs and the nursing notes.   HISTORY  Chief Complaint Mental Health Problem  EM caveat: Some intellectual disability, schizophrenia with suspected exacerbation limiting history and tangential nature  HPI Tamara [name] is a 48 y.o. female here for evaluation  of possibly hearing voices or hallucinations according the patient's family  Patient herself does not have acute complaint.  She is resting in bed.  Reports she would like something to eat.  Patient somewhat tangential.  Does however deny wanting herself or anyone else.  Not obviously responding any external stimuli  Past Medical History:  Diagnosis Date  . GERD (gastroesophageal reflux disease)   . IUD (intrauterine device) in place IUD was placed in 2016  . Leiomyoma of uterus   . Schizophrenia ([place])   . Thyroid disease     Patient Active Problem List   Diagnosis Date Noted  . 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 GERD (gastroesophageal reflux disease) 09/11/2015  . Seborrheic dermatitis 09/11/2015  . 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 Schizophrenia ([place]) 09/10/2015  . 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 No pertinent surgical history.  Prior to Admission medications   Medication Sig Start Date End Date Taking? Authorizing Provider  BANOPHEN 25 MG capsule Take 25 mg by mouth at bedtime. 12/30/18  Yes [provider]  cetirizine (ZYRTEC) 10 MG tablet Take 1 tablet (10 mg total) by mouth daily as needed for allergies. Patient taking differently: Take 10 mg by mouth daily.  09/13/15  Yes Hildred Priest, MD  divalproex (DEPAKOTE ER) 500 MG 24 hr tablet Take 1 tablet (500 mg total) by mouth every 12 (twelve) hours. 02/06/19   Yes Patrecia Pour, NP  haloperidol (HALDOL) 5 MG tablet Take 1 tablet (5 mg total) by mouth 2 (two) times daily as needed for agitation. 02/06/19  Yes Patrecia Pour, NP  haloperidol decanoate (HALDOL DECANOATE) 100 MG/ML injection Inject 100 mg into the muscle every 28 (twenty-eight) days.   Yes [provider]  levothyroxine (SYNTHROID, LEVOTHROID) 88 MCG tablet Take 88 mcg by mouth daily before breakfast.   Yes [provider]  LORazepam (ATIVAN) 1 MG tablet Take 1 tablet (1 mg total) by mouth 2 (two) times daily as needed for anxiety (agitation). 02/06/19  Yes Patrecia Pour, NP    Allergies Patient has no known allergies.  No family history on file.  Social History Social History   Tobacco Use  . Smoking status: Never Smoker  . Smokeless tobacco: Never Used  Substance Use Topics  . Alcohol use: No  . Drug use: No    Review of Systems EM caveat   ____________________________________________   PHYSICAL EXAM:  VITAL SIGNS: ED Triage Vitals  Enc Vitals Group     BP 02/17/19 2006 128/80     Pulse Rate 02/17/19 2006 92     Resp 02/17/19 2006 18     Temp 02/17/19 2006 98.2 F (36.8 C)     Temp Source 02/17/19 2006 Oral     SpO2 02/17/19 2006 100 %     Weight --      Height 02/17/19 2034 5\' 2"  (1.575 m)     Head Circumference --      Peak Flow --      Pain Score 02/17/19 2040  0     Pain Loc --      Pain Edu? --      Excl. in [place]? --     Constitutional: Alert and oriented. Well appearing and in no acute distress.  She time speaking to herself Eyes: Conjunctivae are normal. Head: Atraumatic. Nose: No congestion/rhinnorhea. Mouth/Throat: Mucous membranes are moist. Neck: No stridor.  Cardiovascular: Good peripheral perfusion  respiratory: Normal respiratory effort. Gastrointestinal: Eating a sandwich and chips.  Denies abdominal pain Musculoskeletal: No lower extremity tenderness nor edema. Neurologic:  Normal speech and language. No gross  focal neurologic deficits are appreciated.  Skin:  Skin is warm, dry and intact. No rash noted. Psychiatric: Mood and affect are flat.  Does not appear paranoid.  Speaks somewhat tangentially.  Denies homicidal or suicidal ideation.  ____________________________________________   LABS (all labs ordered are listed, but only abnormal results are displayed)  Labs Reviewed  COMPREHENSIVE METABOLIC PANEL - Abnormal; Notable for the following components:      Result Value   Potassium 3.0 (*)    Glucose, Bld 103 (*)    Total Protein 8.6 (*)    All other components within normal limits  ACETAMINOPHEN LEVEL - Abnormal; Notable for the following components:   Acetaminophen (Tylenol), Serum <10 (*)    All other components within normal limits  SARS CORONAVIRUS 2 (HOSPITAL ORDER, [place] [facility])  ETHANOL  SALICYLATE LEVEL  CBC  URINE DRUG SCREEN, QUALITATIVE ([facility] ONLY)  POC URINE PREG, ED   ____________________________________________  EKG   ____________________________________________  RADIOLOGY   ____________________________________________   PROCEDURES  Procedure(s) performed: None  Procedures  Critical Care performed: No  ____________________________________________   INITIAL IMPRESSION / ASSESSMENT AND PLAN / ED COURSE  Pertinent labs & imaging results that were available during my care of the patient were reviewed by me and considered in my medical decision making (see chart for details). No acute medical etiology is identified in screen exam.  History of known schizophrenia.  Seen in consultation with disposition plan through psychiatric team.  To me does not present an obvious danger to self or others.  No report of homicidal or suicidal ideation.  Further disposition per psychiatric team, discussed with nurse practitioner Kennyth Lose    Ongoing ER care and disposition assigned Dr. Wynn Maudlin was evaluated in Emergency Department  on 02/18/2019 for the symptoms described in the history of present illness. She was evaluated in the context of the global COVID-19 pandemic, which necessitated consideration that the patient might be at risk for infection with the SARS-CoV-2 virus that causes COVID-19. Institutional protocols and algorithms that pertain to the evaluation of patients at risk for COVID-19 are in a state of rapid change based on information released by regulatory bodies including the CDC and federal and state organizations. These policies and algorithms were followed during the patient's care in the ED.   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

## 2019-02-18 DIAGNOSIS — F201 Disorganized schizophrenia: Secondary | ICD-10-CM

## 2019-02-18 MED ORDER — HALOPERIDOL 5 MG PO TABS
5.0000 mg | ORAL_TABLET | Freq: Once | ORAL | Status: AC
  Administered 2019-02-18: 12:00:00 5 mg via ORAL
  Filled 2019-02-18: qty 1

## 2019-02-18 MED ORDER — LORAZEPAM 1 MG PO TABS
1.0000 mg | ORAL_TABLET | Freq: Once | ORAL | Status: AC
  Administered 2019-02-18: 12:00:00 1 mg via ORAL
  Filled 2019-02-18: qty 1

## 2019-02-18 MED ORDER — DIVALPROEX SODIUM 500 MG PO DR TAB
500.0000 mg | DELAYED_RELEASE_TABLET | Freq: Two times a day (BID) | ORAL | Status: DC
  Administered 2019-02-18 – 2019-02-19 (×3): 500 mg via ORAL
  Filled 2019-02-18 (×3): qty 1

## 2019-02-18 NOTE — ED Provider Notes (Signed)
-----------------------------------------   5:48 AM on 02/18/2019 -----------------------------------------   Blood pressure 133/72, pulse 90, temperature 98.2 F (36.8 C), temperature source Oral, resp. rate 16, height 5\' 2"  (1.575 m), SpO2 100 %.  The patient is sleeping at this time.  There have been no acute events since the last update.  Awaiting disposition plan from Behavioral Medicine and/or Social Work team(s).   Paulette Blanch, MD 02/18/19 [phone]

## 2019-02-18 NOTE — ED Notes (Signed)
Hourly rounding reveals patient in room. No complaints, stable, in no acute distress. Q15 minute rounds and monitoring via Security Cameras to continue. 

## 2019-02-18 NOTE — ED Notes (Signed)
Patient observed with no unusual behavior or acute distress. Patient with no verbalized needs or c/o at this time.... will continue to monitor and follow up as needed. Security staff monitoring patient on camera system.

## 2019-02-18 NOTE — ED Notes (Signed)
Patient resting comfortably in room. No complaints or concerns voiced. No distress or abnormal behavior noted. Will continue to monitor with security cameras. Q 15 minute rounds continue.

## 2019-02-18 NOTE — ED Notes (Signed)
Patient took a shower with no complaints

## 2019-02-18 NOTE — ED Notes (Signed)
Report to include Situation, Background, Assessment, and Recommendations received from Jadeka RN. Patient alert and oriented, warm and dry, in no acute distress. Patient denies SI, HI, AVH and pain. Patient made aware of Q15 minute rounds and security cameras for their safety. Patient instructed to come to me with needs or concerns. 

## 2019-02-18 NOTE — ED Notes (Signed)
Patient took her medications with no problems, patient doesn't appear as disorganized in her conversation, Probation officer is able to understand and follow her conversation much better. Patient is still somewhat bizarre and flat in affect.

## 2019-02-18 NOTE — ED Notes (Signed)
Hourly rounding reveals patient sleeping in room. No complaints, stable, in no acute distress. Q15 minute rounds and monitoring via Security Cameras to continue. 

## 2019-02-18 NOTE — Consult Note (Signed)
[facility] Consult   Reason for Consult: Psychiatric evaluation Referring Physician: Dr. Jacqualine Code Patient Identification: Tamara [name] MRN:  [id number] Principal Diagnosis: <principal problem not specified> Diagnosis:  Active Problems:   * No active hospital problems. *   Total Time spent with patient: 45 minutes  Subjective:   Tamara [name] is a 48 y.o. female patient presented to [facility] ED via POV voluntary. The patient's mom discussed that the patient has not been taking her medication. The patient was seen in the ED on 02/06/2019 due to the same problems with her refusing her medication, not bathing, not eating, hearing voices, and hallucinating.  The patient remained in the ED for a few days and was discharged back home.  The patient is brought in for similar problems today (02/17/2019). The patient was seen face-to-face by this provider; the chart reviewed and consulted with Dr. Jacqualine Code on 02/17/2019 due to the patient's care. It was discussed with the EDP the patient will be observed overnight and reassess in the a.m.  The patient is alert and oriented x 1, anxious, disorganized, and mood-congruent with an affect on evaluation. The patient does appear to be responding to internal and external stimuli.  She is presenting with delusional thinking. The patient denies suicidal, homicidal, or self-harm ideations. The patient is presenting with psychotic and paranoia behaviors. During an encounter with the patient, she was unable to answer most questions appropriately.   Plan: The patient will be observed overnight and reassess in the a.m.  HPI: Per Dr. Jacqualine Code; Tamara [name] is a 48 y.o. female here for evaluation of possibly hearing voices or hallucinations according the patient's family  Patient herself does not have acute complaint.  She is resting in bed.  Reports she would like something to eat.  Patient somewhat tangential.  Does however deny wanting herself or  anyone else.  Not obviously responding any external stimuli   Past Psychiatric History:  Schizophrenia  Risk to Self:  No Risk to Others:  No Prior Inpatient Therapy:  Yes Prior Outpatient Therapy:  Yes  Past Medical History:  Past Medical History:  Diagnosis Date  . GERD (gastroesophageal reflux disease)   . IUD (intrauterine device) in place IUD was placed in 2016  . Leiomyoma of uterus   . Schizophrenia ([place])   . Thyroid disease    History reviewed. No pertinent surgical history. Family History: No family history on file. Family Psychiatric  History: No pertinent family psychiatric history Social History:  Social History   Substance and Sexual Activity  Alcohol Use No     Social History   Substance and Sexual Activity  Drug Use No    Social History   Socioeconomic History  . Marital status: Single    Spouse name: Not on file  . Number of children: Not on file  . Years of education: Not on file  . Highest education level: Not on file  Occupational History  . Not on file  Social Needs  . Financial resource strain: Not on file  . Food insecurity    Worry: Not on file    Inability: Not on file  . Transportation needs    Medical: Not on file    Non-medical: Not on file  Tobacco Use  . Smoking status: Never Smoker  . Smokeless tobacco: Never Used  Substance and Sexual Activity  . Alcohol use: No  . Drug use: No  . Sexual activity: Yes    Birth control/protection: Surgical  Lifestyle  . Physical activity    Days per week: Not on file    Minutes per session: Not on file  . Stress: Not on file  Relationships  . Social Herbalist on phone: Not on file    Gets together: Not on file    Attends religious service: Not on file    Active member of club or organization: Not on file    Attends meetings of clubs or organizations: Not on file    Relationship status: Not on file  Other Topics Concern  . Not on file  Social History Narrative  . Not  on file   Additional Social History:    Allergies:  No Known Allergies  Labs:  Results for orders placed or performed during the hospital encounter of 02/17/19 (from the past 48 hour(s))  Comprehensive metabolic panel     Status: Abnormal   Collection Time: 02/17/19  8:26 PM  Result Value Ref Range   Sodium 140 135 - 145 mmol/L   Potassium 3.0 (L) 3.5 - 5.1 mmol/L   Chloride 99 98 - 111 mmol/L   CO2 28 22 - 32 mmol/L   Glucose, Bld 103 (H) 70 - 99 mg/dL   BUN 8 6 - 20 mg/dL   Creatinine, Ser 0.67 0.44 - 1.00 mg/dL   Calcium 9.9 8.9 - 10.3 mg/dL   Total Protein 8.6 (H) 6.5 - 8.1 g/dL   Albumin 4.3 3.5 - 5.0 g/dL   AST 20 15 - 41 U/L   ALT 11 0 - 44 U/L   Alkaline Phosphatase 65 38 - 126 U/L   Total Bilirubin 0.6 0.3 - 1.2 mg/dL   GFR calc non Af Amer >60 >60 mL/min   GFR calc Af Amer >60 >60 mL/min   Anion gap 13 5 - 15    Comment: Performed at [facility], [address] [place], [place] [address]  Ethanol     Status: None   Collection Time: 02/17/19  8:26 PM  Result Value Ref Range   Alcohol, Ethyl (B) <10 <10 mg/dL    Comment: (NOTE) Lowest detectable limit for serum alcohol is 10 mg/dL. For medical purposes only. Performed at [facility], [address] [place], [place] [id number]   Salicylate level     Status: None   Collection Time: 02/17/19  8:26 PM  Result Value Ref Range   Salicylate Lvl [id number] 2.8 - 30.0 mg/dL    Comment: Performed at [facility], [address] [place], [place] [address]  Acetaminophen level     Status: Abnormal   Collection Time: 02/17/19  8:26 PM  Result Value Ref Range   Acetaminophen (Tylenol), Serum <10 (L) 10 - 30 ug/mL    Comment: (NOTE) Therapeutic concentrations vary significantly. A range of 10-30 ug/mL  may be an effective concentration for many patients. However, some  are best treated at concentrations outside of this range. Acetaminophen concentrations >150 ug/mL at 4 hours after ingestion   and >50 ug/mL at 12 hours after ingestion are often associated with  toxic reactions. Performed at [facility], [address] [place], [place] [address]   cbc     Status: None   Collection Time: 02/17/19  8:26 PM  Result Value Ref Range   WBC 8.1 4.0 - 10.5 K/uL   RBC 4.55 3.87 - 5.11 MIL/uL   Hemoglobin 14.3 12.0 - 15.0 g/dL   HCT 41.8 36.0 - 46.0 %  MCV 91.9 80.0 - 100.0 fL   MCH 31.4 26.0 - 34.0 pg   MCHC 34.2 30.0 - 36.0 g/dL   RDW 12.7 11.5 - 15.5 %   Platelets 332 150 - 400 K/uL   nRBC 0.0 0.0 - 0.2 %    Comment: Performed at [facility], [address] [place], [place] [address]  SARS Coronavirus 2 [facility] order, Performed in [facility] hospital lab) Nasopharyngeal Nasopharyngeal Swab     Status: None   Collection Time: 02/17/19  9:03 PM   Specimen: Nasopharyngeal Swab  Result Value Ref Range   SARS Coronavirus 2 NEGATIVE NEGATIVE    Comment: (NOTE) If result is NEGATIVE SARS-CoV-2 target nucleic acids are NOT DETECTED. The SARS-CoV-2 RNA is generally detectable in upper and lower  respiratory specimens during the acute phase of infection. The lowest  concentration of SARS-CoV-2 viral copies this assay can detect is 250  copies / mL. A negative result does not preclude SARS-CoV-2 infection  and should not be used as the sole basis for treatment or other  patient management decisions.  A negative result may occur with  improper specimen collection / handling, submission of specimen other  than nasopharyngeal swab, presence of viral mutation(s) within the  areas targeted by this assay, and inadequate number of viral copies  (<250 copies / mL). A negative result must be combined with clinical  observations, patient history, and epidemiological information. If result is POSITIVE SARS-CoV-2 target nucleic acids are DETECTED. The SARS-CoV-2 RNA is generally detectable in upper and lower  respiratory specimens dur ing the acute phase of  infection.  Positive  results are indicative of active infection with SARS-CoV-2.  Clinical  correlation with patient history and other diagnostic information is  necessary to determine patient infection status.  Positive results do  not rule out bacterial infection or co-infection with other viruses. If result is PRESUMPTIVE POSTIVE SARS-CoV-2 nucleic acids MAY BE PRESENT.   A presumptive positive result was obtained on the submitted specimen  and confirmed on repeat testing.  While 2019 novel coronavirus  (SARS-CoV-2) nucleic acids may be present in the submitted sample  additional confirmatory testing may be necessary for epidemiological  and / or clinical management purposes  to differentiate between  SARS-CoV-2 and other Sarbecovirus currently known to infect humans.  If clinically indicated additional testing with an alternate test  methodology [phone]) is advised. The SARS-CoV-2 RNA is generally  detectable in upper and lower respiratory sp ecimens during the acute  phase of infection. The expected result is Negative. Fact Sheet for Patients:  StrictlyIdeas.no Fact Sheet for Healthcare Providers: BankingDealers.co.za This test is not yet approved or cleared by the [place] FDA and has been authorized for detection and/or diagnosis of SARS-CoV-2 by FDA under an Emergency Use Authorization (EUA).  This EUA will remain in effect (meaning this test can be used) for the duration of the COVID-19 declaration under Section 564(b)(1) of the Act, 21 U.S.C. section 360bbb-3(b)(1), unless the authorization is terminated or revoked sooner. Performed at [facility], [address] [place], [place] [address]     No current facility-administered medications for this encounter.    Current Outpatient Medications  Medication Sig Dispense Refill  . BANOPHEN 25 MG capsule Take 25 mg by mouth at bedtime.    . cetirizine (ZYRTEC)  10 MG tablet Take 1 tablet (10 mg total) by mouth daily as needed for allergies. (Patient taking differently: Take 10 mg by mouth daily. )    .  divalproex (DEPAKOTE ER) 500 MG 24 hr tablet Take 1 tablet (500 mg total) by mouth every 12 (twelve) hours. 60 tablet 1  . haloperidol (HALDOL) 5 MG tablet Take 1 tablet (5 mg total) by mouth 2 (two) times daily as needed for agitation. 60 tablet 1  . haloperidol decanoate (HALDOL DECANOATE) 100 MG/ML injection Inject 100 mg into the muscle every 28 (twenty-eight) days.    Marland Kitchen levothyroxine (SYNTHROID, LEVOTHROID) 88 MCG tablet Take 88 mcg by mouth daily before breakfast.    . LORazepam (ATIVAN) 1 MG tablet Take 1 tablet (1 mg total) by mouth 2 (two) times daily as needed for anxiety (agitation). 30 tablet 0    Musculoskeletal: Strength & Muscle Tone: within normal limits Gait & Station: normal Patient leans: N/A  Psychiatric Specialty Exam: Physical Exam  Nursing note and vitals reviewed. Constitutional: She appears well-developed.  HENT:  Head: Normocephalic.  Eyes: Pupils are equal, round, and reactive to light.  Neck: Normal range of motion.  Cardiovascular: Normal rate.  Respiratory: Effort normal.  Musculoskeletal: Normal range of motion.  Neurological: She is alert.  Skin: Skin is warm and dry.    Review of Systems  Psychiatric/Behavioral: Positive for hallucinations. The patient is nervous/anxious.   All other systems reviewed and are negative.   Blood pressure 133/72, pulse 90, temperature 98.2 F (36.8 C), temperature source Oral, resp. rate 16, height 5\' 2"  (1.575 m), SpO2 100 %.Body mass index is 32.92 kg/m.  General Appearance: Disheveled and Guarded  Eye Contact:  Poor  Speech:  Garbled  Volume:  Decreased  Mood:  Dysphoric  Affect:  Congruent and Inappropriate  Thought Process:  Disorganized  Orientation:  Other:  Alert and oriented x1  Thought Content:  Delusions and Paranoid Ideation  Suicidal Thoughts:  No   Homicidal Thoughts:  No  Memory:  Immediate;   Unable to assess Recent;   Unable to assess Remote;   Unable to assess  Judgement:  Impaired  Insight:  Lacking  Psychomotor Activity:  Decreased  Concentration:  Concentration: Poor and Attention Span: Poor  Recall:  Poor  Fund of Knowledge:  Poor  Language:  Poor  Akathisia:  Negative  Handed:  Right  AIMS (if indicated):     Assets:  Desire for Improvement Leisure Time Social Support  ADL's:  Impaired  Cognition:  Impaired,  Severe  Sleep:   Insomnia     Treatment Plan Summary: Daily contact with patient to assess and evaluate symptoms and progress in treatment, Medication management and Plan Patient will be observed overnight and reassess in the a.m. to determine if she meets criteria for psychiatric inpatient admission or to be discharged to family  Disposition: No evidence of imminent risk to self or others at present.   Supportive therapy provided about ongoing stressors. The patient will be observed overnight and reassess in the a.m. to determine if she meets criteria for psychiatric inpatient admission or to be discharged back to family.  Caroline Sauger, NP 02/18/2019 2:19 AM

## 2019-02-18 NOTE — Consult Note (Signed)
Patient reassessed this morning after staying the night last night upon reassessment patient was was displaying erratic behaviors however she was cooperative with interview and agreeable to take medication.  Behaviors included pacing around her room messing her bed and remaking it and being tangential and nonsensical in her speech.  Patient did take medication after some convincing from nursing staff and her behavior started to improve. Collateral information was obtained by the mother who states that patient has been noncompliant and has been displaying these are the same erratic behaviors in the home.  Mother was informed of plan to continue with standing medication administration and reassess for improvement.  Diagnosis: Schizophrenia and mild intellectual disability Assessment: Patient is clearly decompensated in the context of medication noncompliance, however she is not displaying any aggressive or violent behavior.  She denies SI or HI and is willing to take medication.  Plan: We will continue to administer standing medication and reassess for improvement.  Plan to discharge home once patient is clinically improved.

## 2019-02-19 NOTE — ED Notes (Signed)
Hourly rounding reveals patient in room. No complaints, stable, in no acute distress. Q15 minute rounds and monitoring via Security Cameras to continue. 

## 2019-02-19 NOTE — Discharge Instructions (Signed)
Managing Schizophrenia If you have been diagnosed with schizophrenia, you may be relieved to know why you have felt or behaved a certain way. Still, you may have questions about the treatment ahead, how to get the support you need, and how to deal with the condition day-to-day. With care and support, you can learn to manage your symptoms and live with schizophrenia. How to manage lifestyle changes Managing stress Stress is your body's reaction to life changes and events, both good and bad. For people with schizophrenia, stress can sometimes cause an episode to start (can be a trigger), so it is important to learn ways to manage stress. Your health care provider, therapist, or counselor may suggest some techniques such as:  Meditation, muscle relaxation, and breathing exercises.  Music therapy. This can include creating music or listening to music.  Life skills training. This training is focused on work, self-care, money, house management, and social skills. Other things you can do to manage stress include:  Keeping a stress diary. This can help you learn what causes your stress to start and how you can control your response to those triggers.  Exercising. Even a short daily walk can help.  Getting enough sleep.  Making a schedule to manage your time. Knowing what you will do from day to day helps you avoid feeling overwhelmed by tasks and deadlines.  Spending time on hobbies you enjoy that help you relax.  Medicines Antipsychotic medicines are usually prescribed to help manage this condition. Make sure you:  Talk with your pharmacist or health care provider about all medicines that you take, the possible side effects, and which medicines are safe to take together.  Make it your goal to take part in all treatment decisions (shared decision-making). Ask about possible side effects of medicines that your health care provider recommends, and tell him or her how you feel about having those  side effects. It is best if shared decision-making with your health care provider is part of your total treatment plan. Relationships Having the support of your family and friends can play a major role in the success of your treatment. The following steps can help you maintain healthy relationships:  Think about going to couples therapy, family therapy, or family education classes.  Create a written plan for your treatment, and include close family members and friends in the process.  Consider bringing your partner or another family member or friend to the appointments you have with your health care provider. How to recognize changes in your condition If you find that your condition is getting worse, talk to your health care provider right away. Watch for these signs:  You hear, see, taste, and things that others do not (hallucinate).  You cannot set aside persistent, unusual thoughts or beliefs, no matter what others may believe.  Your speech becomes unclear.  You withdraw from friends and family members.  You have racing thoughts and have trouble thinking clearly or staying focused.  You have poor personal hygiene, weight gain or weight loss, or changes in how you are sleeping or eating. Follow these instructions at home:  Take over-the-counter and prescription medicines only as told by your health care provider. Do not start new medicines or stop taking medicines before you ask your health care provider if it is safe to make those changes.  Avoid caffeine, alcohol, and drugs. They can affect how your medicine works and can make your symptoms worse.  Eat a healthy diet.  Look for support groups  in your area so you can meet other people with your condition. You will learn different methods that others have used to manage schizophrenia.  Keep all follow-up visits as told by your health care provider, therapist, or counselor. This is important. Where to find support Talking to  others  Reach out to trusted friends or family members, explain your condition, and let them know that you are working with a health care team.  Consider giving educational materials to friends and family.  If you are having trouble telling your friends and family about your condition, keep in mind that honest and open communication can make these conversations easier. Finances Be sure to check with your insurance carrier to find out what treatment options are covered by your plan. You may also be able to find financial assistance through not-for-profit organizations or with local government-based resources. If you are taking medicines, you may be able to get the generic form, which may be less expensive than brand-name medicine. Some makers of prescription medicines also offer help to patients who cannot afford the medicines that they need. Therapy and support groups  Make sure you find a counselor or therapist who is familiar with schizophrenia. Meet with your counselor or therapist once a week or more often if needed.  Find support programs for people with schizophrenia. Where to find more information [employer] on Mental Illness: www.nami.org Contact a health care provider if:  You are not able to take your medicines as prescribed.  Your symptoms get worse. Get help right away if:  You have serious thoughts about hurting yourself or others. If you ever feel like you may hurt yourself or others, or have thoughts about taking your own life, get help right away. You can go to your nearest emergency department or call:  Your local emergency services (911 in the [place]  A suicide crisis helpline, such as the [place] at 959-687-0445. This is open 24 hours a day. Summary  Schizophrenia is a lifelong illness. It is best controlled with continuous treatment that includes medicine and therapy.  Learning ways to deal with stress may help your treatment  work better.  Having the support of your family and friends can help to make your treatment a success.  If you find that your condition is getting worse, talk to your health care provider right away. This information is not intended to replace advice given to you by your health care provider. Make sure you discuss any questions you have with your health care provider. Document Released: 09/11/2016 Document Revised: 09/03/2018 Document Reviewed: 09/11/2016 Elsevier Patient Education  [place].

## 2019-02-19 NOTE — ED Notes (Signed)
Hourly rounding reveals patient sleeping in room. No complaints, stable, in no acute distress. Q15 minute rounds and monitoring via Security Cameras to continue. 

## 2019-02-19 NOTE — ED Provider Notes (Signed)
-----------------------------------------   4:54 AM on 02/19/2019 -----------------------------------------  Blood pressure 99/67, pulse 87, temperature (!) 97.5 F (36.4 C), temperature source Oral, resp. rate 18, height 5\' 2"  (1.575 m), SpO2 100 %.  The patient is calm and cooperative at this time.  There have been no acute events since the last update.  Awaiting disposition plan from Behavioral Medicine team.   [name], MD 02/19/19 [phone]

## 2019-02-19 NOTE — BH Assessment (Signed)
Writer spoke with the patient to complete updated/reassessment. Patient was able to share her name and nothing else. She was confused and having some thought blocking. Patient had a flat affect. It's reported the patient was refusing to take her medications while at home, which caused her to come to th ER.

## 2019-02-19 NOTE — ED Provider Notes (Signed)
11:00 AM Patient has been seen and evaluated by psychiatry team and deemed appropriate for discharge. Patient determined not to be a threat to themselves or others. Patient is otherwise medically clear.   Will plan for discharge with outpatient follow up.     Lilia Pro., MD 02/19/19 1101

## 2019-02-19 NOTE — Consult Note (Addendum)
[facility] ED Discharge  02/19/2019 10:53 AM Tamara [name]  MRN:  [id number] Principal Problem: Schizophrenia [facility]) Discharge Diagnoses: Principal Problem:   Schizophrenia ([place])  Subjective: "I feel better."  Smiling and engages easily in conversation.  Patient seen and evaluated in person.  No suicidal/homicidal ideations, hallucinations, or substance abuse. She was not taking her medications prior to admission.  These were restarted and she stabilized.  No psychosis or disorganized thought processes.  She has appropriate behaviors and is at her baseline.  Able to return to her group home  Total Time spent with patient: 30 minutes  Past Psychiatric History: schizophrenia  Past Medical History:  Past Medical History:  Diagnosis Date  . GERD (gastroesophageal reflux disease)   . IUD (intrauterine device) in place IUD was placed in 2016  . Leiomyoma of uterus   . Schizophrenia ([place])   . Thyroid disease    History reviewed. No pertinent surgical history. Family History: No family history on file. Family Psychiatric  History: none Social History:  Social History   Substance and Sexual Activity  Alcohol Use No     Social History   Substance and Sexual Activity  Drug Use No    Social History   Socioeconomic History  . Marital status: Single    Spouse name: Not on file  . Number of children: Not on file  . Years of education: Not on file  . Highest education level: Not on file  Occupational History  . Not on file  Social Needs  . Financial resource strain: Not on file  . Food insecurity    Worry: Not on file    Inability: Not on file  . Transportation needs    Medical: Not on file    Non-medical: Not on file  Tobacco Use  . Smoking status: Never Smoker  . Smokeless tobacco: Never Used  Substance and Sexual Activity  . Alcohol use: No  . Drug use: No  . Sexual activity: Yes    Birth control/protection: Surgical  Lifestyle  . Physical activity    Days per  week: Not on file    Minutes per session: Not on file  . Stress: Not on file  Relationships  . Social Herbalist on phone: Not on file    Gets together: Not on file    Attends religious service: Not on file    Active member of club or organization: Not on file    Attends meetings of clubs or organizations: Not on file    Relationship status: Not on file  Other Topics Concern  . Not on file  Social History Narrative  . Not on file    Has this patient used any form of tobacco in the last 30 days? (Cigarettes, Smokeless Tobacco, Cigars, and/or Pipes) None  Current Medications: Current Facility-Administered Medications  Medication Dose Route Frequency Provider Last Rate Last Dose  . divalproex (DEPAKOTE) DR tablet 500 mg  500 mg Oral BID Cristofano, Dorene Ar, MD   500 mg at 02/19/19 0900   Current Outpatient Medications  Medication Sig Dispense Refill  . BANOPHEN 25 MG capsule Take 25 mg by mouth at bedtime.    . cetirizine (ZYRTEC) 10 MG tablet Take 1 tablet (10 mg total) by mouth daily as needed for allergies. (Patient taking differently: Take 10 mg by mouth daily. )    . divalproex (DEPAKOTE ER) 500 MG 24 hr tablet Take 1 tablet (500 mg total) by mouth every 12 (  twelve) hours. 60 tablet 1  . haloperidol (HALDOL) 5 MG tablet Take 1 tablet (5 mg total) by mouth 2 (two) times daily as needed for agitation. 60 tablet 1  . haloperidol decanoate (HALDOL DECANOATE) 100 MG/ML injection Inject 100 mg into the muscle every 28 (twenty-eight) days.    Marland Kitchen levothyroxine (SYNTHROID, LEVOTHROID) 88 MCG tablet Take 88 mcg by mouth daily before breakfast.    . LORazepam (ATIVAN) 1 MG tablet Take 1 tablet (1 mg total) by mouth 2 (two) times daily as needed for anxiety (agitation). 30 tablet 0   PTA Medications: (Not in a hospital admission)   Musculoskeletal: Strength & Muscle Tone: within normal limits Gait & Station: normal Patient leans: N/A  Psychiatric Specialty Exam: Physical  Exam  Nursing note and vitals reviewed. Constitutional: She is oriented to person, place, and time. She appears well-developed and well-nourished.  HENT:  Head: Normocephalic.  Neck: Normal range of motion.  Respiratory: Effort normal.  Musculoskeletal: Normal range of motion.  Neurological: She is alert and oriented to person, place, and time.  Psychiatric: Her speech is normal and behavior is normal. Thought content normal. Her mood appears anxious. Cognition and memory are impaired. She expresses impulsivity.    Review of Systems  Psychiatric/Behavioral: The patient is nervous/anxious.   All other systems reviewed and are negative.   Blood pressure 99/67, pulse 87, temperature (!) 97.5 F (36.4 C), temperature source Oral, resp. rate 18, height 5\' 2"  (1.575 m), SpO2 100 %.Body mass index is 32.92 kg/m.  General Appearance: Casual  Eye Contact:  Good  Speech:  Normal Rate  Volume:  Normal  Mood:  Anxious  Affect:  Blunt  Thought Process:  Coherent and Descriptions of Associations: Intact  Orientation:  Full (Time, Place, and Person)  Thought Content:  WDL and Logical  Suicidal Thoughts:  No  Homicidal Thoughts:  No  Memory:  Immediate;   Fair Recent;   Fair Remote;   Fair  Judgement:  Fair  Insight:  Fair  Psychomotor Activity:  Normal  Concentration:  Concentration: Fair and Attention Span: Fair  Recall:  [employer] of Knowledge:  Fair  Language:  Good  Akathisia:  No  Handed:  Right  AIMS (if indicated):     Assets:  Housing Leisure Time Physical Health Resilience Social Support  ADL's:  Intact  Cognition:  Impaired,  Mild  Sleep:        Demographic Factors:  NA  Loss Factors: NA  Historical Factors: Impulsivity  Risk Reduction Factors:   Sense of responsibility to family, Living with another person, especially a relative, Positive social support and Positive therapeutic relationship  Continued Clinical Symptoms:  Anxiety, mild  Cognitive  Features That Contribute To Risk:  None    Suicide Risk:  Minimal: No identifiable suicidal ideation.  Patients presenting with no risk factors but with morbid ruminations; may be classified as minimal risk based on the severity of the depressive symptoms  Follow-up Information    Schedule an appointment as soon as possible for a visit  with [facility].   Why: For ER visit follow-up and reevaluation. Contact information: [address] [place] [address] [phone]           Plan Of Care/Follow-up recommendations:  Schizophrenia: -Continue Haldol dec monthly -Continue Depakote 500 mg BID Activity:  as tolerated Diet:  heart healthy diet  Disposition: discharge to her facility Waylan Boga, NP 02/19/2019, 10:53 AM   Case discussed and plan  agreed upon as outlined above.

## 2019-02-19 NOTE — BH Assessment (Signed)
Writer spoke with the patient to complete updated/reassessment. Patient was coherent and able engage in the conversation. She was able to provide appropriate answers to the questions. She is requesting to go home and be with her mother.

## 2019-02-19 NOTE — ED Notes (Signed)
Patient discharged home with mother Camrey Cairnes, patient received discharge papers. Patient received belongings and verbalized she has received all of her belongings. Patient appropriate and cooperative, Denies SI/HI AVH. Vital signs taken. NAD noted.

## 2019-03-10 ENCOUNTER — Other Ambulatory Visit (HOSPITAL_COMMUNITY): Payer: Self-pay | Admitting: Psychiatry

## 2019-03-23 ENCOUNTER — Other Ambulatory Visit: Payer: Self-pay

## 2019-03-23 ENCOUNTER — Encounter: Payer: Self-pay | Admitting: *Deleted

## 2019-03-23 DIAGNOSIS — Z046 Encounter for general psychiatric examination, requested by authority: Secondary | ICD-10-CM | POA: Diagnosis present

## 2019-03-23 DIAGNOSIS — Z20828 Contact with and (suspected) exposure to other viral communicable diseases: Secondary | ICD-10-CM | POA: Insufficient documentation

## 2019-03-23 DIAGNOSIS — Z79899 Other long term (current) drug therapy: Secondary | ICD-10-CM | POA: Insufficient documentation

## 2019-03-23 DIAGNOSIS — F209 Schizophrenia, unspecified: Secondary | ICD-10-CM | POA: Insufficient documentation

## 2019-03-23 LAB — CBC
HCT: 39.9 % (ref 36.0–46.0)
Hemoglobin: 13.6 g/dL (ref 12.0–15.0)
MCH: 31.3 pg (ref 26.0–34.0)
MCHC: 34.1 g/dL (ref 30.0–36.0)
MCV: 91.7 fL (ref 80.0–100.0)
Platelets: 286 10*3/uL (ref 150–400)
RBC: 4.35 MIL/uL (ref 3.87–5.11)
RDW: 13 % (ref 11.5–15.5)
WBC: 7.6 10*3/uL (ref 4.0–10.5)
nRBC: 0 % (ref 0.0–0.2)

## 2019-03-23 LAB — COMPREHENSIVE METABOLIC PANEL
ALT: 8 U/L (ref 0–44)
AST: 16 U/L (ref 15–41)
Albumin: 4.1 g/dL (ref 3.5–5.0)
Alkaline Phosphatase: 63 U/L (ref 38–126)
Anion gap: 12 (ref 5–15)
BUN: 7 mg/dL (ref 6–20)
CO2: 27 mmol/L (ref 22–32)
Calcium: 9.7 mg/dL (ref 8.9–10.3)
Chloride: 93 mmol/L — ABNORMAL LOW (ref 98–111)
Creatinine, Ser: 0.67 mg/dL (ref 0.44–1.00)
GFR calc Af Amer: >60 mL/min (ref >60)
GFR calc non Af Amer: >60 mL/min (ref >60)
Glucose, Bld: 89 mg/dL (ref 70–99)
Potassium: 3.5 mmol/L (ref 3.5–5.1)
Sodium: 132 mmol/L — ABNORMAL LOW (ref 135–145)
Total Bilirubin: 0.8 mg/dL (ref 0.3–1.2)
Total Protein: 8.3 g/dL — ABNORMAL HIGH (ref 6.5–8.1)

## 2019-03-23 LAB — URINE DRUG SCREEN, QUALITATIVE (ARMC ONLY)
Amphetamines, Ur Screen: NOT DETECTED
Barbiturates, Ur Screen: NOT DETECTED
Benzodiazepine, Ur Scrn: NOT DETECTED
Cannabinoid 50 Ng, Ur ~~LOC~~: NOT DETECTED
Cocaine Metabolite,Ur ~~LOC~~: NOT DETECTED
MDMA (Ecstasy)Ur Screen: NOT DETECTED
Methadone Scn, Ur: NOT DETECTED
Opiate, Ur Screen: NOT DETECTED
Phencyclidine (PCP) Ur S: NOT DETECTED
Tricyclic, Ur Screen: NOT DETECTED

## 2019-03-23 LAB — SALICYLATE LEVEL: Salicylate Lvl: <7 mg/dL (ref 2.8–30.0)

## 2019-03-23 LAB — ACETAMINOPHEN LEVEL: Acetaminophen (Tylenol), Serum: <10 ug/mL — ABNORMAL LOW (ref 10–30)

## 2019-03-23 LAB — ETHANOL: Alcohol, Ethyl (B): <10 mg/dL (ref <10)

## 2019-03-23 LAB — POCT PREGNANCY, URINE: Preg Test, Ur: NEGATIVE

## 2019-03-23 NOTE — ED Notes (Signed)
1 purple shirt, 1 pair jeans, 1 beige belt, 1 pair brown booties,

## 2019-03-23 NOTE — ED Triage Notes (Signed)
Pt ambulatory to triage.  Pt brought in by her mother.  Mother reports she is seeing things, not eating or drinking.   Pt alert  Speech clear.

## 2019-03-23 NOTE — ED Notes (Signed)
poct pregnancy Negative 

## 2019-03-24 ENCOUNTER — Emergency Department
Admission: EM | Admit: 2019-03-24 | Discharge: 2019-03-24 | Disposition: A | Discharge status: Home / Self Care | Payer: Medicaid Other | Attending: Emergency Medicine | Admitting: Emergency Medicine

## 2019-03-24 DIAGNOSIS — F209 Schizophrenia, unspecified: Secondary | ICD-10-CM

## 2019-03-24 DIAGNOSIS — F70 Mild intellectual disabilities: Secondary | ICD-10-CM | POA: Diagnosis present

## 2019-03-24 DIAGNOSIS — L219 Seborrheic dermatitis, unspecified: Secondary | ICD-10-CM | POA: Diagnosis present

## 2019-03-24 DIAGNOSIS — F54 Psychological and behavioral factors associated with disorders or diseases classified elsewhere: Secondary | ICD-10-CM | POA: Diagnosis present

## 2019-03-24 DIAGNOSIS — K219 Gastro-esophageal reflux disease without esophagitis: Secondary | ICD-10-CM | POA: Diagnosis present

## 2019-03-24 DIAGNOSIS — F200 Paranoid schizophrenia: Secondary | ICD-10-CM | POA: Diagnosis present

## 2019-03-24 DIAGNOSIS — E031 Congenital hypothyroidism without goiter: Secondary | ICD-10-CM | POA: Diagnosis present

## 2019-03-24 LAB — TSH: TSH: 1.808 u[IU]/mL (ref 0.350–4.500)

## 2019-03-24 LAB — SARS CORONAVIRUS 2 BY RT PCR (HOSPITAL ORDER, PERFORMED IN ~~LOC~~ HOSPITAL LAB): SARS Coronavirus 2: NEGATIVE

## 2019-03-24 LAB — VALPROIC ACID LEVEL: Valproic Acid Lvl: 114 ug/mL — ABNORMAL HIGH (ref 50.0–100.0)

## 2019-03-24 MED ORDER — HALOPERIDOL 5 MG PO TABS: 5.0000 mg | ORAL_TABLET | Freq: Two times a day (BID) | ORAL | Status: DC | PRN

## 2019-03-24 MED ORDER — ARIPIPRAZOLE 10 MG PO TABS: 10.0000 mg | ORAL_TABLET | Freq: Every day | ORAL | Status: DC

## 2019-03-24 MED ORDER — LORAZEPAM 1 MG PO TABS
1.0000 mg | ORAL_TABLET | Freq: Two times a day (BID) | ORAL | Status: DC | PRN
  Administered 2019-03-24: 10:00:00 1 mg via ORAL
  Filled 2019-03-24: qty 1

## 2019-03-24 MED ORDER — DIVALPROEX SODIUM ER 500 MG PO TB24
500.0000 mg | ORAL_TABLET | Freq: Two times a day (BID) | ORAL | Status: DC
  Administered 2019-03-24: 10:00:00 500 mg via ORAL
  Filled 2019-03-24: qty 1

## 2019-03-24 MED ORDER — DIPHENHYDRAMINE HCL 25 MG PO CAPS: 25.0000 mg | ORAL_CAPSULE | Freq: Every day | ORAL | Status: DC

## 2019-03-24 MED ORDER — DIVALPROEX SODIUM ER 250 MG PO TB24: 375.0000 mg | ORAL_TABLET | Freq: Every day | ORAL | Status: DC

## 2019-03-24 MED ORDER — LORATADINE 10 MG PO TABS
10.0000 mg | ORAL_TABLET | Freq: Every day | ORAL | Status: DC
  Administered 2019-03-24: 10:00:00 10 mg via ORAL
  Filled 2019-03-24: qty 1

## 2019-03-24 MED ORDER — DIVALPROEX SODIUM 125 MG PO DR TAB
125.0000 mg | DELAYED_RELEASE_TABLET | Freq: Every day | ORAL | Status: DC
  Filled 2019-03-24: qty 1

## 2019-03-24 MED ORDER — HALOPERIDOL DECANOATE 100 MG/ML IM SOLN
100.0000 mg | INTRAMUSCULAR | Status: DC
  Filled 2019-03-24: qty 1

## 2019-03-24 MED ORDER — DIVALPROEX SODIUM ER 250 MG PO TB24
250.0000 mg | ORAL_TABLET | Freq: Every day | ORAL | Status: DC
  Filled 2019-03-24: qty 1

## 2019-03-24 MED ORDER — LEVOTHYROXINE SODIUM 88 MCG PO TABS: 88.0000 ug | ORAL_TABLET | Freq: Every day | ORAL | Status: DC

## 2019-03-24 NOTE — ED Notes (Signed)
BEHAVIORAL HEALTH ROUNDING Patient sleeping: No. Patient alert: yes Behavior appropriate: Yes.  ; If no, describe:  Nutrition and fluids offered: yes Toileting and hygiene offered: Yes  Sitter present: q15 minute observations and security camera monitoring   

## 2019-03-24 NOTE — ED Notes (Signed)
BEHAVIORAL HEALTH ROUNDING Patient sleeping: No. Patient alert : yes Behavior appropriate: Yes.  ; If no, describe:  Nutrition and fluids offered: yes Toileting and hygiene offered: Yes  Sitter present: q15 minute observations and security camera monitoring Law enforcement present: Yes    ENVIRONMENTAL ASSESSMENT Potentially harmful objects out of patient reach: Yes.   Personal belongings secured: Yes.   Patient dressed in hospital provided attire only: Yes.   Plastic bags out of patient reach: Yes.   Patient care equipment (cords, cables, call bells, lines, and drains) shortened, removed, or accounted for: Yes.   Equipment and supplies removed from bottom of stretcher: Yes.   Potentially toxic materials out of patient reach: Yes.   Sharps container removed or out of patient reach: Yes.

## 2019-03-24 NOTE — Consult Note (Signed)
[facility] Consult   Reason for Consult: Psychiatric evaluation Referring Physician: Dr. Cinda Quest Patient Identification: Tamara [name] MRN:  [id number] Principal Diagnosis: <principal problem not specified> Diagnosis:  Active Problems:   Schizophrenia ([place])   GERD (gastroesophageal reflux disease)   Seborrheic dermatitis   Mild intellectual disability   Congenital hypothyroidism without goiter   Psychogenic polydipsia   Total Time spent with patient: 45 minutes  Subjective:   Tamara [name] is a 48 y.o. female patient presented to [facility] ED via POV voluntary. The patient's mom discussed that the patient has not been taking her medication. The patient was seen in the ED on many previous occasions due to the same problems with her refusing her medicine, not eating, medications non-compliant, and refusing to come in the house. During her assessment, she was more responsive to the assessment process than previously. The patient was seen face-to-face by this provider; the chart reviewed and consulted with Dr. Cinda Quest on 03/24/2019 due to the patient's care. It was discussed with the EDP the patient will be observed overnight and reassess in the a.m.  The patient is alert and oriented x 1, anxious, disorganized, and mood-congruent with an affect on evaluation. The patient does appear to be responding to internal and external stimuli.  She is presenting with delusional thinking. The patient denies suicidal, homicidal, or self-harm ideations. The patient is not presenting with any psychotic and paranoia behaviors. During an encounter with the patient, she was unable to answer some questions appropriately.   Plan: The patient home medications will be restarted, and she will be observed overnight and reassess in the a.m.  HPI: Per Dr. Cinda Quest; Tamara [name] is a 48 y.o. female with a history of disorganized schizophrenia who is brought in by her mom. Mom reports patient is not  been sleeping been refusing to come to the house.  She is not taking her medications properly.  Mom reports she is having auditory and visual hallucinations.  Patient is sitting does not make eye contact denies everything. She however does not give a good history she is says now I am not seeing things, yes I am drinking.  Yes I am sleeping.  She cannot tell me when she last slept or what she last drank.  Past Psychiatric History:  Schizophrenia  Risk to Self:  No Risk to Others:  No Prior Inpatient Therapy:  Yes Prior Outpatient Therapy:  Yes  Past Medical History:  Past Medical History:  Diagnosis Date  . GERD (gastroesophageal reflux disease)   . IUD (intrauterine device) in place IUD was placed in 2016  . Leiomyoma of uterus   . Schizophrenia ([place])   . Thyroid disease    No past surgical history on file. Family History: No family history on file. Family Psychiatric  History: No pertinent family psychiatric history Social History:  Social History   Substance and Sexual Activity  Alcohol Use No     Social History   Substance and Sexual Activity  Drug Use No    Social History   Socioeconomic History  . Marital status: Single    Spouse name: Not on file  . Number of children: Not on file  . Years of education: Not on file  . Highest education level: Not on file  Occupational History  . Not on file  Social Needs  . Financial resource strain: Not on file  . Food insecurity    Worry: Not on file    Inability: Not on  file  . Transportation needs    Medical: Not on file    Non-medical: Not on file  Tobacco Use  . Smoking status: Never Smoker  . Smokeless tobacco: Never Used  Substance and Sexual Activity  . Alcohol use: No  . Drug use: No  . Sexual activity: Yes    Birth control/protection: Surgical  Lifestyle  . Physical activity    Days per week: Not on file    Minutes per session: Not on file  . Stress: Not on file  Relationships  . Social Product manager on phone: Not on file    Gets together: Not on file    Attends religious service: Not on file    Active member of club or organization: Not on file    Attends meetings of clubs or organizations: Not on file    Relationship status: Not on file  Other Topics Concern  . Not on file  Social History Narrative  . Not on file   Additional Social History:    Allergies:  No Known Allergies  Labs:  Results for orders placed or performed during the hospital encounter of 03/24/19 (from the past 48 hour(s))  Comprehensive metabolic panel     Status: Abnormal   Collection Time: 03/23/19  8:59 PM  Result Value Ref Range   Sodium 132 (L) 135 - 145 mmol/L   Potassium 3.5 3.5 - 5.1 mmol/L   Chloride 93 (L) 98 - 111 mmol/L   CO2 27 22 - 32 mmol/L   Glucose, Bld 89 70 - 99 mg/dL   BUN 7 6 - 20 mg/dL   Creatinine, Ser 0.67 0.44 - 1.00 mg/dL   Calcium 9.7 8.9 - 10.3 mg/dL   Total Protein 8.3 (H) 6.5 - 8.1 g/dL   Albumin 4.1 3.5 - 5.0 g/dL   AST 16 15 - 41 U/L   ALT 8 0 - 44 U/L   Alkaline Phosphatase 63 38 - 126 U/L   Total Bilirubin 0.8 0.3 - 1.2 mg/dL   GFR calc non Af Amer >60 >60 mL/min   GFR calc Af Amer >60 >60 mL/min   Anion gap 12 5 - 15    Comment: Performed at [facility], [address], [place], [place] [address]  Ethanol     Status: None   Collection Time: 03/23/19  8:59 PM  Result Value Ref Range   Alcohol, Ethyl (B) <10 <10 mg/dL    Comment: (NOTE) Lowest detectable limit for serum alcohol is 10 mg/dL. For medical purposes only. Performed at [facility], [address] [place], [place] [id number]   Salicylate level     Status: None   Collection Time: 03/23/19  8:59 PM  Result Value Ref Range   Salicylate Lvl [id number] 2.8 - 30.0 mg/dL    Comment: Performed at [facility], [address] [place], [place] [address]  Acetaminophen level     Status: Abnormal   Collection Time: 03/23/19  8:59 PM  Result Value Ref Range   Acetaminophen  (Tylenol), Serum <10 (L) 10 - 30 ug/mL    Comment: (NOTE) Therapeutic concentrations vary significantly. A range of 10-30 ug/mL  may be an effective concentration for many patients. However, some  are best treated at concentrations outside of this range. Acetaminophen concentrations >150 ug/mL at 4 hours after ingestion  and >50 ug/mL at 12 hours after ingestion are often associated with  toxic reactions. Performed at [facility], [address] [place], [place] [address]   cbc     Status: None   Collection Time: 03/23/19  8:59 PM  Result Value Ref Range   WBC 7.6 4.0 - 10.5 K/uL   RBC 4.35 3.87 - 5.11 MIL/uL   Hemoglobin 13.6 12.0 - 15.0 g/dL   HCT 39.9 36.0 - 46.0 %   MCV 91.7 80.0 - 100.0 fL   MCH 31.3 26.0 - 34.0 pg   MCHC 34.1 30.0 - 36.0 g/dL   RDW 13.0 11.5 - 15.5 %   Platelets 286 150 - 400 K/uL   nRBC 0.0 0.0 - 0.2 %    Comment: Performed at [facility], [address] [place], [place] [address]  Urine Drug Screen, Qualitative     Status: None   Collection Time: 03/23/19  8:59 PM  Result Value Ref Range   Tricyclic, Ur Screen NONE DETECTED NONE DETECTED   Amphetamines, Ur Screen NONE DETECTED NONE DETECTED   MDMA (Ecstasy)Ur Screen NONE DETECTED NONE DETECTED   Cocaine Metabolite,Ur [place] NONE DETECTED NONE DETECTED   Opiate, Ur Screen NONE DETECTED NONE DETECTED   Phencyclidine (PCP) Ur S NONE DETECTED NONE DETECTED   Cannabinoid 50 Ng, Ur [place] NONE DETECTED NONE DETECTED   Barbiturates, Ur Screen NONE DETECTED NONE DETECTED   Benzodiazepine, Ur Scrn NONE DETECTED NONE DETECTED   Methadone Scn, Ur NONE DETECTED NONE DETECTED    Comment: (NOTE) Tricyclics + metabolites, urine    Cutoff 1000 ng/mL Amphetamines + metabolites, urine  Cutoff 1000 ng/mL MDMA (Ecstasy), urine              Cutoff 500 ng/mL Cocaine Metabolite, urine          Cutoff 300 ng/mL Opiate + metabolites, urine        Cutoff 300 ng/mL Phencyclidine (PCP), urine         Cutoff 25  ng/mL Cannabinoid, urine                 Cutoff 50 ng/mL Barbiturates + metabolites, urine  Cutoff 200 ng/mL Benzodiazepine, urine              Cutoff 200 ng/mL Methadone, urine                   Cutoff 300 ng/mL The urine drug screen provides only a preliminary, unconfirmed analytical test result and should not be used for non-medical purposes. Clinical consideration and professional judgment should be applied to any positive drug screen result due to possible interfering substances. A more specific alternate chemical method must be used in order to obtain a confirmed analytical result. Gas chromatography / mass spectrometry (GC/MS) is the preferred confirmat ory method. Performed at [facility], [address] [place], [place] [address]   TSH     Status: None   Collection Time: 03/23/19  8:59 PM  Result Value Ref Range   TSH 1.808 0.350 - 4.500 uIU/mL    Comment: Performed by a 3rd Generation assay with a functional sensitivity of <=0.01 uIU/mL. Performed at [facility], [address] [place], [place] [address]   Valproic acid level     Status: Abnormal   Collection Time: 03/23/19  8:59 PM  Result Value Ref Range   Valproic Acid Lvl 114 (H) 50.0 - 100.0 ug/mL    Comment: Performed at [facility], [address] [place], [place] [address]  Pregnancy, urine POC     Status: None   Collection Time:  03/23/19  9:10 PM  Result Value Ref Range   Preg Test, Ur NEGATIVE NEGATIVE    Comment:        THE SENSITIVITY OF THIS METHODOLOGY IS >24 mIU/mL     No current facility-administered medications for this encounter.    Current Outpatient Medications  Medication Sig Dispense Refill  . ARIPiprazole (ABILIFY) 10 MG tablet Take 10 mg by mouth at bedtime.    Marland Kitchen BANOPHEN 25 MG capsule Take 25 mg by mouth at bedtime.    . cetirizine (ZYRTEC) 10 MG tablet Take 1 tablet (10 mg total) by mouth daily as needed for allergies. (Patient taking differently:  Take 10 mg by mouth daily. )    . divalproex (DEPAKOTE ER) 250 MG 24 hr tablet Take 1.5 tablets by mouth at bedtime.    . divalproex (DEPAKOTE ER) 500 MG 24 hr tablet Take 1 tablet (500 mg total) by mouth every 12 (twelve) hours. 60 tablet 1  . haloperidol (HALDOL) 5 MG tablet Take 1 tablet (5 mg total) by mouth 2 (two) times daily as needed for agitation. 60 tablet 1  . haloperidol decanoate (HALDOL DECANOATE) 100 MG/ML injection Inject 100 mg into the muscle every 28 (twenty-eight) days.    Marland Kitchen levothyroxine (SYNTHROID, LEVOTHROID) 88 MCG tablet Take 88 mcg by mouth daily before breakfast.    . LORazepam (ATIVAN) 1 MG tablet Take 1 tablet (1 mg total) by mouth 2 (two) times daily as needed for anxiety (agitation). 30 tablet 0    Musculoskeletal: Strength & Muscle Tone: within normal limits Gait & Station: normal Patient leans: N/A  Psychiatric Specialty Exam: Physical Exam  Nursing note and vitals reviewed. Constitutional: She appears well-developed.  HENT:  Head: Normocephalic.  Eyes: Pupils are equal, round, and reactive to light.  Neck: Normal range of motion.  Cardiovascular: Normal rate.  Respiratory: Effort normal.  Musculoskeletal: Normal range of motion.  Neurological: She is alert.  Skin: Skin is warm and dry.    Review of Systems  Psychiatric/Behavioral: The patient is nervous/anxious and has insomnia.   All other systems reviewed and are negative.   Blood pressure (!) 158/75, pulse 98, temperature 98.3 F (36.8 C), temperature source Oral, resp. rate 18, height 5\' 2"  (1.575 m), weight 61.2 kg, SpO2 98 %.Body mass index is 24.69 kg/m.  General Appearance: Disheveled and Guarded  Eye Contact:  Poor  Speech:  Garbled  Volume:  Decreased  Mood:  Dysphoric  Affect:  Congruent and Inappropriate  Thought Process:  Disorganized  Orientation:  Other:  Alert and oriented x1  Thought Content:  WDL and Delusions  Suicidal Thoughts:  No  Homicidal Thoughts:  No  Memory:   Immediate;   Unable to assess Recent;   Unable to assess Remote;   Unable to assess  Judgement:  Impaired  Insight:  Lacking  Psychomotor Activity:  Decreased  Concentration:  Concentration: Poor and Attention Span: Poor  Recall:  Poor  Fund of Knowledge:  Poor  Language:  Poor  Akathisia:  Negative  Handed:  Right  AIMS (if indicated):     Assets:  Desire for Improvement Leisure Time Social Support  ADL's:  Impaired  Cognition:  Impaired,  Severe  Sleep:   Insomnia     Treatment Plan Summary: Daily contact with patient to assess and evaluate symptoms and progress in treatment, Medication management and Plan Patient will be observed overnight and reassess in the a.m. to determine if she meets criteria for psychiatric inpatient admission or  to be discharged to family.  The patient home medication will be restarted due to mom voicing that she is medication noncompliant at home.  Disposition: No evidence of imminent risk to self or others at present.   Supportive therapy provided about ongoing stressors. The patient will be observed overnight and reassess in the a.m. to determine if she meets criteria for psychiatric inpatient admission or to be discharged back to family.  Caroline Sauger, NP 03/24/2019 6:02 AM

## 2019-03-24 NOTE — ED Notes (Signed)
Patient patients mom, patient has not been sleeping and has been refusing to come into house. States she has been having AVH, and not taking medications correctly. Patient states she has been sleeping and denies SI/HI/AVH. Mom currently in waiting room.

## 2019-03-24 NOTE — ED Provider Notes (Signed)
Patient has been cleared for discharge by psychiatry.   Delman Kitten, MD 03/24/19 1550

## 2019-03-24 NOTE — ED Notes (Signed)
Mother verbalizes that she took all of the pts clothing home yesterday  - pt discharged with a hospital gown and blue paper scrub pants on

## 2019-03-24 NOTE — ED Notes (Addendum)
BEHAVIORAL HEALTH ROUNDING Patient sleeping: No. Patient alert: yes Behavior appropriate: Yes.  ; If no, describe:  Nutrition and fluids offered: yes Toileting and hygiene offered: Yes  Sitter present: q15 minute observations and security camera monitoring   

## 2019-03-24 NOTE — ED Notes (Signed)
Lunch tray given. 

## 2019-03-24 NOTE — ED Provider Notes (Signed)
[facility] Emergency Department Provider Note   ____________________________________________   First MD Initiated Contact with Patient 03/24/19 0148     (approximate)  I have reviewed the triage vital signs and the nursing notes.   HISTORY  Chief Complaint Behavior Problem    HPI Tamara [name] is a 48 y.o. female with a history of disorganized schizophrenia who is brought in by her mom.  Mom reports patient is not been sleeping been refusing to come to the house.  She is not taking her medications properly.  Mom reports she is having auditory and visual hallucinations.  Patient is sitting does not make eye contact denies everything.  She however does not give a good history she is says now I am not seeing things, yes I am drinking.  Yes I am sleeping.  She cannot tell me when she last slept or what she last drank          Past Medical History:  Diagnosis Date  . GERD (gastroesophageal reflux disease)   . IUD (intrauterine device) in place IUD was placed in 2016  . Leiomyoma of uterus   . Schizophrenia ([place])   . Thyroid disease     Patient Active Problem List   Diagnosis Date Noted  . 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 GERD (gastroesophageal reflux disease) 09/11/2015  . Seborrheic dermatitis 09/11/2015  . 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 Schizophrenia ([place]) 09/10/2015  . 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  Prior to Admission medications   Medication Sig Start Date End Date Taking? Authorizing Provider  ARIPiprazole (ABILIFY) 10 MG tablet Take 10 mg by mouth at bedtime. 02/24/19   [provider]  BANOPHEN 25 MG capsule Take 25 mg by mouth at bedtime. 12/30/18   [provider]  cetirizine (ZYRTEC) 10 MG tablet Take 1 tablet (10 mg total) by mouth daily as needed for allergies. Patient taking differently: Take 10 mg by mouth daily.  09/13/15    Hildred Priest, MD  divalproex (DEPAKOTE ER) 250 MG 24 hr tablet Take 1.5 tablets by mouth at bedtime. 02/24/19   [provider]  divalproex (DEPAKOTE ER) 500 MG 24 hr tablet Take 1 tablet (500 mg total) by mouth every 12 (twelve) hours. 02/06/19   Patrecia Pour, NP  haloperidol (HALDOL) 5 MG tablet Take 1 tablet (5 mg total) by mouth 2 (two) times daily as needed for agitation. 02/06/19   Patrecia Pour, NP  haloperidol decanoate (HALDOL DECANOATE) 100 MG/ML injection Inject 100 mg into the muscle every 28 (twenty-eight) days.    [provider]  levothyroxine (SYNTHROID, LEVOTHROID) 88 MCG tablet Take 88 mcg by mouth daily before breakfast.    [provider]  LORazepam (ATIVAN) 1 MG tablet Take 1 tablet (1 mg total) by mouth 2 (two) times daily as needed for anxiety (agitation). 02/06/19   Patrecia Pour, NP    Allergies Patient has no known allergies.  No family history on file.  Social History Social History   Tobacco Use  . Smoking status: Never Smoker  . Smokeless tobacco: Never Used  Substance Use Topics  . Alcohol use: No  . Drug use: No    Review of Systems  Constitutional: No fever/chills Eyes: No visual changes. ENT: No sore throat. Cardiovascular: Denies chest pain. Respiratory: Denies shortness of breath. Gastrointestinal: No abdominal pain.  No nausea, no vomiting.  No diarrhea.  No constipation. Genitourinary: Negative  for dysuria. Musculoskeletal: Negative for back pain. Skin: Negative for rash. Neurological: Negative for headaches, focal weakness   ____________________________________________   PHYSICAL EXAM:  VITAL SIGNS: ED Triage Vitals  Enc Vitals Group     BP 03/23/19 2055 (!) 158/75     Pulse Rate 03/23/19 2055 98     Resp 03/23/19 2055 18     Temp 03/23/19 2055 98.3 F (36.8 C)     Temp Source 03/23/19 2055 Oral     SpO2 03/23/19 2055 98 %     Weight 03/23/19 2056 135 lb (61.2 kg)     Height  03/23/19 2056 5\' 2"  (1.575 m)     Head Circumference --      Peak Flow --      Pain Score --      Pain Loc --      Pain Edu? --      Excl. in [place]? --     Constitutional: Alert and oriented.  Not in any apparent pain Eyes: Conjunctivae are normal. Head: Atraumatic. Nose: No congestion/rhinnorhea. Mouth/Throat: Mucous membranes are moist.  Oropharynx non-erythematous. Neck: No stridor. Cardiovascular: Normal rate, regular rhythm. Grossly normal heart sounds.  Good peripheral circulation. Respiratory: Normal respiratory effort.  No retractions. Lungs CTAB. Gastrointestinal: Soft and nontender. No distention. No abdominal bruits. No CVA tenderness. Musculoskeletal: No lower extremity tenderness nor edema.  . Neurologic:  Normal speech and language. No gross focal neurologic deficits are appreciated.  Skin:  Skin is warm, dry and intact. No rash noted.  ____________________________________________   LABS (all labs ordered are listed, but only abnormal results are displayed)  Labs Reviewed  COMPREHENSIVE METABOLIC PANEL - Abnormal; Notable for the following components:      Result Value   Sodium 132 (*)    Chloride 93 (*)    Total Protein 8.3 (*)    All other components within normal limits  ACETAMINOPHEN LEVEL - Abnormal; Notable for the following components:   Acetaminophen (Tylenol), Serum <10 (*)    All other components within normal limits  VALPROIC ACID LEVEL - Abnormal; Notable for the following components:   Valproic Acid Lvl 114 (*)    All other components within normal limits  SARS CORONAVIRUS 2 BY RT PCR (HOSPITAL ORDER, [facility])  ETHANOL  SALICYLATE LEVEL  CBC  URINE DRUG SCREEN, QUALITATIVE ([place])  TSH  POC URINE PREG, ED  POCT PREGNANCY, URINE   ____________________________________________  EKG   ____________________________________________  RADIOLOGY  ED MD interpretation:  Official radiology report(s): No results  found.  ____________________________________________   PROCEDURES  Procedure(s) performed (including Critical Care):  Procedures   ____________________________________________   INITIAL IMPRESSION / ASSESSMENT AND PLAN / ED COURSE  Tamara [name] was evaluated in Emergency Department on 03/24/2019 for the symptoms described in the history of present illness. She was evaluated in the context of the global COVID-19 pandemic, which necessitated consideration that the patient might be at risk for infection with the SARS-CoV-2 virus that causes COVID-19. Institutional protocols and algorithms that pertain to the evaluation of patients at risk for COVID-19 are in a state of rapid change based on information released by regulatory bodies including the CDC and federal and state organizations. These policies and algorithms were followed during the patient's care in the ED.              ____________________________________________   FINAL CLINICAL IMPRESSION(S) / ED DIAGNOSES  Final diagnoses:  Schizophrenia, unspecified type ([place])  ED Discharge Orders    None       Note:  This document was prepared using Dragon voice recognition software and may include unintentional dictation errors.    Nena Polio, MD 03/24/19 [phone]

## 2019-03-24 NOTE — Consult Note (Signed)
  Patient reassessed this morning at 10 AM.  Patient denies any suicidal or homicidal ideation.  Patient denies any psychosis or paranoia.  Patient denies any delusions or audio or visual hallucinations.  Patient requesting to return home at this time patient was able to have a full night sleep without issue, woke up and ate breakfast and is currently taking a shower.  Mother was spoken to and called for collateral, agreeable to pick the patient up in light of her improved status.   Plan: Discharge patient home, IVC rescinded

## 2019-03-24 NOTE — ED Notes (Addendum)
ED  Is the patient under IVC or is there intent for IVC: Yes.   Is the patient medically cleared: Yes.   Is there vacancy in the ED BHU: Yes.   Is the population mix appropriate for patient: Yes.   Is the patient awaiting placement in inpatient or outpatient setting:  Has the patient had a psychiatric consult:  Reassess this am Survey of unit performed for contraband, proper placement and condition of furniture, tampering with fixtures in bathroom, shower, and each patient room: Yes.  ; Findings:  APPEARANCE/BEHAVIOR Calm and cooperative NEURO ASSESSMENT Orientation: oriented to self - place  Hallucinations:  Denies at this time Speech: Normal  Pressured at times Gait: normal RESPIRATORY ASSESSMENT Even  Unlabored respirations  CARDIOVASCULAR ASSESSMENT Pulses equal   regular rate  Skin warm and dry   GASTROINTESTINAL ASSESSMENT no GI complaint EXTREMITIES Full ROM  PLAN OF CARE Provide calm/safe environment. Vital signs assessed twice daily. ED BHU Assessment once each 12-hour shift. Collaborate with TTS daily or as condition indicates. Assure the ED provider has rounded once each shift. Provide and encourage hygiene. Provide redirection as needed. Assess for escalating behavior; address immediately and inform ED provider.  Assess family dynamic and appropriateness for visitation as needed: Yes.  ; If necessary, describe findings:  Educate the patient/family about BHU procedures/visitation: Yes.  ; If necessary, describe findings:

## 2019-03-24 NOTE — ED Notes (Signed)
Pt observed with no unusual behavior  Appropriate to stimulation assessment completed   She denies pain   No verbalized needs or concerns at this time  NAD assessed  Continue to monitor

## 2019-03-24 NOTE — ED Notes (Signed)

## 2019-03-25 ENCOUNTER — Other Ambulatory Visit: Payer: Self-pay

## 2019-03-25 ENCOUNTER — Emergency Department
Admission: EM | Admit: 2019-03-25 | Discharge: 2019-03-27 | Disposition: A | Discharge status: Home / Self Care | Payer: Medicaid Other | Attending: Emergency Medicine | Admitting: Emergency Medicine

## 2019-03-25 DIAGNOSIS — Z79899 Other long term (current) drug therapy: Secondary | ICD-10-CM | POA: Insufficient documentation

## 2019-03-25 DIAGNOSIS — F70 Mild intellectual disabilities: Secondary | ICD-10-CM | POA: Insufficient documentation

## 2019-03-25 DIAGNOSIS — Z046 Encounter for general psychiatric examination, requested by authority: Secondary | ICD-10-CM | POA: Diagnosis not present

## 2019-03-25 DIAGNOSIS — R442 Other hallucinations: Secondary | ICD-10-CM | POA: Diagnosis present

## 2019-03-25 DIAGNOSIS — F200 Paranoid schizophrenia: Secondary | ICD-10-CM | POA: Insufficient documentation

## 2019-03-25 DIAGNOSIS — R443 Hallucinations, unspecified: Secondary | ICD-10-CM

## 2019-03-25 DIAGNOSIS — F202 Catatonic schizophrenia: Secondary | ICD-10-CM | POA: Diagnosis not present

## 2019-03-25 NOTE — ED Triage Notes (Signed)
Patient brought in by mother/legal guardian to be evaluated for hallucinations/dellusions and increased agitation. Patient has been threatening to move to a hotel (away from mother). [name] denies that patient has been SI. Patient agitated. Patient refusing blood draw.

## 2019-03-25 NOTE — ED Notes (Signed)
Per MD [place], no blood draw required at this time and the patient does not need to be changed out.

## 2019-03-26 DIAGNOSIS — F200 Paranoid schizophrenia: Secondary | ICD-10-CM

## 2019-03-26 MED ORDER — LORATADINE 10 MG PO TABS
10.0000 mg | ORAL_TABLET | Freq: Every day | ORAL | Status: DC
  Administered 2019-03-26 – 2019-03-27 (×2): 10 mg via ORAL
  Filled 2019-03-26 (×2): qty 1

## 2019-03-26 MED ORDER — DIPHENHYDRAMINE HCL 25 MG PO CAPS
25.0000 mg | ORAL_CAPSULE | Freq: Every day | ORAL | Status: DC
  Administered 2019-03-26: 25 mg via ORAL
  Filled 2019-03-26: qty 1

## 2019-03-26 MED ORDER — HALOPERIDOL 5 MG PO TABS: 5.0000 mg | ORAL_TABLET | Freq: Two times a day (BID) | ORAL | Status: DC | PRN

## 2019-03-26 MED ORDER — DIVALPROEX SODIUM ER 500 MG PO TB24: 750.0000 mg | ORAL_TABLET | Freq: Every day | ORAL | Status: DC

## 2019-03-26 MED ORDER — LEVOTHYROXINE SODIUM 88 MCG PO TABS
88.0000 ug | ORAL_TABLET | Freq: Every day | ORAL | Status: DC
  Administered 2019-03-27: 88 ug via ORAL
  Filled 2019-03-26: qty 1

## 2019-03-26 MED ORDER — DIVALPROEX SODIUM ER 500 MG PO TB24
500.0000 mg | ORAL_TABLET | Freq: Two times a day (BID) | ORAL | Status: DC
  Administered 2019-03-26 – 2019-03-27 (×3): 500 mg via ORAL
  Filled 2019-03-26 (×3): qty 1

## 2019-03-26 MED ORDER — LORAZEPAM 1 MG PO TABS: 1.0000 mg | ORAL_TABLET | Freq: Two times a day (BID) | ORAL | Status: DC | PRN

## 2019-03-26 MED ORDER — HALOPERIDOL LACTATE 5 MG/ML IJ SOLN: 5.0000 mg | Freq: Two times a day (BID) | INTRAMUSCULAR | Status: DC

## 2019-03-26 MED ORDER — ARIPIPRAZOLE 10 MG PO TABS
10.0000 mg | ORAL_TABLET | Freq: Every day | ORAL | Status: DC
  Administered 2019-03-26: 10 mg via ORAL
  Filled 2019-03-26: qty 1

## 2019-03-26 MED ORDER — BENZTROPINE MESYLATE 1 MG PO TABS
1.0000 mg | ORAL_TABLET | Freq: Every day | ORAL | Status: DC
  Administered 2019-03-26 – 2019-03-27 (×2): 1 mg via ORAL
  Filled 2019-03-26 (×2): qty 1

## 2019-03-26 MED ORDER — HALOPERIDOL 5 MG PO TABS
5.0000 mg | ORAL_TABLET | Freq: Two times a day (BID) | ORAL | Status: DC
  Administered 2019-03-26 – 2019-03-27 (×3): 5 mg via ORAL
  Filled 2019-03-26 (×3): qty 1

## 2019-03-26 NOTE — ED Provider Notes (Signed)
[facility] Emergency Department Provider Note  ____________________________________________   First MD Initiated Contact with Patient 03/25/19 2258     (approximate)  I have reviewed the triage vital signs and the nursing notes.   HISTORY  Chief Complaint Hallucinations    HPI Tamara [name] is a 48 y.o. female schizophrenia who presents with mother for hallucinations.  [name] she is continued to have hallucinations since being discharged although it seems like she has hallucinations at baseline.  They are both auditory and visual.  She says that she wants to go to a hotel to stay.  She denies any SI or HI.  She denies her being at harm to herself or to others.  However she says that she just feels tired and that she has not improved at all since coming home.  Patient was seen on 10/29.  Labs were reassuring at that time.  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 No pertinent surgical history.  Prior to Admission medications   Medication Sig Start Date End Date Taking? Authorizing Provider  ARIPiprazole (ABILIFY) 10 MG tablet Take 10 mg by mouth at bedtime. 02/24/19   [provider]  BANOPHEN 25 MG capsule Take 25 mg by mouth at bedtime. 12/30/18   [provider]  cetirizine (ZYRTEC) 10  MG tablet Take 1 tablet (10 mg total) by mouth daily as needed for allergies. Patient taking differently: Take 10 mg by mouth daily.  09/13/15   Hildred Priest, MD  divalproex (DEPAKOTE ER) 250 MG 24 hr tablet Take 1.5 tablets by mouth at bedtime. 02/24/19   [provider]  divalproex (DEPAKOTE ER) 500 MG 24 hr tablet Take 1 tablet (500 mg total) by mouth every 12 (twelve) hours. 02/06/19   Patrecia Pour, NP  haloperidol (HALDOL) 5 MG tablet Take 1 tablet (5 mg total) by mouth 2 (two) times daily as needed for agitation. 02/06/19   Patrecia Pour, NP  haloperidol decanoate (HALDOL DECANOATE) 100 MG/ML injection Inject 100 mg into the muscle every 28 (twenty-eight) days.    [provider]  levothyroxine (SYNTHROID, LEVOTHROID) 88 MCG tablet Take 88 mcg by mouth daily before breakfast.    [provider]  LORazepam (ATIVAN) 1 MG tablet Take 1 tablet (1 mg total) by mouth 2 (two) times daily as needed for anxiety (agitation). 02/06/19   Patrecia Pour, NP    Allergies Patient has no known allergies.  No family history on file.  Social History Social History   Tobacco Use   Smoking status: Never Smoker   Smokeless tobacco: Never Used  Substance Use Topics   Alcohol use: No   Drug use: No      Review of Systems Constitutional: No fever/chills Eyes: No visual changes. ENT: No sore throat. Cardiovascular: Denies chest pain. Respiratory:  Denies shortness of breath. Gastrointestinal: No abdominal pain.  No nausea, no vomiting.  No diarrhea.  No constipation. Genitourinary: Negative for dysuria. Musculoskeletal: Negative for back pain. Skin: Negative for rash. Neurological: Negative for headaches, focal weakness or numbness. Psych: Hallucinations All other ROS negative ____________________________________________   PHYSICAL EXAM:  VITAL SIGNS: ED Triage Vitals  Enc Vitals Group     BP 03/25/19 2258 139/74     Pulse Rate 03/25/19 2258  (!) 108     Resp 03/25/19 2258 (!) 22     Temp 03/25/19 2258 98.7 F (37.1 C)     Temp src --      SpO2 03/25/19 2258 100 %     Weight 03/25/19 2259 134 lb 14.7 oz (61.2 kg)     Height --      Head Circumference --      Peak Flow --      Pain Score 03/25/19 2259 0     Pain Loc --      Pain Edu? --      Excl. in [place]? --     Constitutional: Alert and oriented. Well appearing and in no acute distress. Eyes: Conjunctivae are normal. EOMI. Head: Atraumatic. Nose: No congestion/rhinnorhea. Mouth/Throat: Mucous membranes are moist.   Neck: No stridor. Trachea Midline. FROM Cardiovascular: Tachycardic, regular rhythm. Grossly normal heart sounds.  Good peripheral circulation. Respiratory: Normal respiratory effort.  No retractions. Lungs CTAB. Gastrointestinal: Soft and nontender. No distention. No abdominal bruits.  Musculoskeletal: No lower extremity tenderness nor edema.  No joint effusions. Neurologic:  Normal speech and language. No gross focal neurologic deficits are appreciated.  Skin:  Skin is warm, dry and intact. No rash noted. Psychiatric: Mumbling speech sometimes difficult to understand, no SI or HI GU: Deferred   ____________________________________________   LABS (all labs ordered are listed, but only abnormal results are displayed)  Labs Reviewed - No data to display ____________________________________________     INITIAL IMPRESSION / ASSESSMENT AND PLAN / ED COURSE  Tamara [name] was evaluated in Emergency Department on 03/26/2019 for the symptoms described in the history of present illness. She was evaluated in the context of the global COVID-19 pandemic, which necessitated consideration that the patient might be at risk for infection with the SARS-CoV-2 virus that causes COVID-19. Institutional protocols and algorithms that pertain to the evaluation of patients at risk for COVID-19 are in a state of rapid change based on information released by regulatory  bodies including the CDC and federal and state organizations. These policies and algorithms were followed during the patient's care in the ED.    Patient presents with mother for hallucinations.  Per mother she is not danger to herself or to others.  Sounds are this is baseline for her to have hallucinations.  I do not patient meets IVC criteria at this time. I do not think pt needs repeat labs given done yesterday and no evidence of anemia, aki, electrolyte abnormalities.  Pt is without any acute medical complaints. No exam findings to suggest medical cause of current presentation. Will order psychiatric screening labs and discuss further w/ psychiatric service.  D/d includes but is not limited to psychiatric disease, behavioral/personality disorder, inadequate socioeconomic support, medical.  Based on HPI, exam, unremarkable labs, no concern for acute medical problem at this time. No rigidity, clonus, hyperthermia, focal neurologic deficit, diaphoresis, tachycardia, meningismus, ataxia, gait abnormality or other finding to suggest this visit represents a non-psychiatric problem. Screening labs reviewed.    Given this,  pt medically cleared, to be dispositioned per Psych.  ____________________________________________   FINAL CLINICAL IMPRESSION(S) / ED DIAGNOSES   Final diagnoses:  Hallucination      MEDICATIONS GIVEN DURING THIS VISIT:  Medications - No data to display   ED Discharge Orders    None       Note:  This document was prepared using Dragon voice recognition software and may include unintentional dictation errors.   Vanessa [place], MD 03/26/19 [phone]

## 2019-03-26 NOTE — BH Assessment (Signed)
Assessment Note  Tamara [name] is an 48 y.o. female. Who presents to the ER accompanied by her mother. Writer was unable to complete a thorough face to face assessment due to AMS.  Due to pts present mental status the following information was obtain by reviewing the pts charts, and consulting with collateral resources.TTS has spoken with the pts mother who states that pt has  History of schizophrenia and has begin to suffer from hallucinations.  [name] she is continued to have hallucinations since being discharged although it seems like she has hallucinations at baseline.  They are both auditory and visual. She shares that on yesterday it was difficult to convince the pt to take her medication. She explains that the pt now refuses to bath properly or eat regularly. She continues to [place] that he pt has become paranoid and continues to run from he home yelling. Pts mother is concerned about being able to manage these behavioral concerns.     Diagnosis: Schizophrenia   Past Medical History:  Past Medical History:  Diagnosis Date  . GERD (gastroesophageal reflux disease)   . IUD (intrauterine device) in place IUD was placed in 2016  . Leiomyoma of uterus   . Schizophrenia ([place])   . Thyroid disease     History reviewed. No pertinent surgical history.  Family History: No family history on file.  Social History:  reports that she has never smoked. She has never used smokeless tobacco. She reports that she does not drink alcohol or use drugs.  Additional Social History:  Alcohol / Drug Use Pain Medications: See PTA Prescriptions: See PTA Over the Counter: See PTA History of alcohol / drug use?: No history of alcohol / drug abuse Longest period of sobriety (when/how long): None reported  CIWA: CIWA-Ar BP: 139/74 Pulse Rate: (!) 108 COWS:    Allergies: No Known Allergies  Home Medications: (Not in a hospital admission)   OB/GYN Status:  No LMP recorded. (Menstrual  status: IUD).  General Assessment Data Assessment unable to be completed: Yes Reason for not completing assessment: ams TTS Assessment: In system Patient Accompanied by:: Parent Living Arrangements: Parent Admission Status: Voluntary Referral Source: Self/Family/Friend Insurance type: medicaid   Medical Screening Exam ([place]) Medical Exam completed: Yes  Crisis Care Plan Living Arrangements: Parent Legal Guardian: Other:(mother) Name of Psychiatrist: [facility] Name of Therapist: None  Education Status Is patient currently in school?: No Is the patient employed, unemployed or receiving disability?: Unemployed, Receiving disability income  Risk to self with the past 6 months Suicidal Ideation: (UTA) Has patient been a risk to self within the past 6 months prior to admission? : Other (comment) Substance abuse history and/or treatment for substance abuse?: No  Risk to Others within the past 6 months Does patient have any lifetime risk of violence toward others beyond the six months prior to admission? : Unknown Does patient have a court date: No Is patient on probation?: No  Psychosis Hallucinations: Auditory Delusions: (UTA)  Mental Status Report Appearance/Hygiene: Unable to Assess Eye Contact: Unable to Assess Motor Activity: Unable to assess Speech: Unable to assess Level of Consciousness: Unable to assess Thought Processes: Unable to Assess Judgement: Unable to Assess Orientation: Unable to assess Obsessive Compulsive Thoughts/Behaviors: Unable to Assess  Cognitive Functioning Memory: Unable to Assess Insight: Unable to Assess Impulse Control: Unable to Assess Sleep: Unable to Assess Vegetative Symptoms: Unable to Assess  ADLScreening [facility] Assessment Services) [name] cognitive ability adequate to safely complete daily activities?: Yes  Patient able to express need for assistance with ADLs?: Yes Independently performs ADLs?: Yes (appropriate for  developmental age)  Prior Inpatient Therapy Prior Inpatient Therapy: Yes Prior Therapy Facilty[facility]Provider[facility]  Prior Outpatient Therapy Prior Outpatient Therapy: Yes Prior Therapy Dates: Current Prior Therapy Facilty/Provider(s): [facility] Reason for Treatment: Schizophrenia Does patient have an ACCT team?: No Does patient have Intensive In-House Services?  : No Does patient have [facility] services? : No Does patient have P4CC services?: No  ADL Screening (condition at time of admission) [name] cognitive ability adequate to safely complete daily activities?: Yes Patient able to express need for assistance with ADLs?: Yes Independently performs ADLs?: Yes (appropriate for developmental age)       Abuse/Neglect Assessment (Assessment to be complete while patient is alone) Physical Abuse: Denies Verbal Abuse: Denies Sexual Abuse: Denies Exploitation of patient/patient's resources: Denies Self-Neglect: Denies Values / Beliefs Cultural Requests During Hospitalization: None Spiritual Requests During Hospitalization: None Consults Spiritual Care Consult Needed: No Social Work Consult Needed: No Regulatory affairs officer (For Healthcare) Does Patient Have a Medical Advance Directive?: No Would patient like information on creating a medical advance directive?: No - Patient declined          Disposition:     On Site Evaluation by:   Reviewed with Physician:    Laretta Alstrom 03/26/2019 7:45 AM

## 2019-03-26 NOTE — ED Notes (Signed)
Hourly rounding reveals patient sleeping in room. No complaints, stable, in no acute distress. Q15 minute rounds and monitoring via Security Cameras to continue. 

## 2019-03-26 NOTE — ED Notes (Signed)
Pt provided breakfast tray at this time, pt refusing to eat

## 2019-03-26 NOTE — ED Provider Notes (Signed)
-----------------------------------------   8:58 AM on 03/26/2019 -----------------------------------------  BP 139/74 (BP Location: Left Arm)   Pulse (!) 108   Temp 98.7 F (37.1 C)   Resp (!) 22   Wt 61.2 kg   SpO2 100%   BMI 24.68 kg/m   The patient is calm and cooperative at this time.  There have been no acute events since the last update.  Awaiting disposition plan from Behavioral Medicine and/or Social Work team(s).   Lilia Pro., MD 03/26/19 [phone]

## 2019-03-26 NOTE — ED Notes (Signed)
Pt given lunch tray.

## 2019-03-26 NOTE — ED Notes (Signed)
Hourly rounding reveals patient in sleeping room. No complaints, stable, in no acute distress. Q15 minute rounds and monitoring via [employer] to continue.

## 2019-03-26 NOTE — ED Notes (Signed)
Pt did not have armband.

## 2019-03-26 NOTE — ED Notes (Signed)
Psych at bedside.

## 2019-03-26 NOTE — ED Notes (Signed)
This RN placed red shirt on pt over her clothes , no scrub pants available. PT wanded by BPD

## 2019-03-26 NOTE — Consult Note (Signed)
[place] [facility] Consult   Reason for Consult: Psychiatric evaluation Referring Physician: Dr. Rip Harbour Patient Identification: Tamara [name] MRN:  [id number] Principal Diagnosis: Schizophrenia, paranoid type [facility]) Diagnosis:  Principal Problem:   Schizophrenia, paranoid type ([place])  Total Time spent with patient: one hour  Subjective:   Tamara [name] is a 48 y.o. female patient who started having hallucinations yesterday and started yelling.  Reluctant to take medications. History of autism and schizophrenia. Not eating or taking care of her ADLs at this time  Patient seen and evaluated in person by this provider.  Difficult to understand at times on the assessment.  When asked if she had eaten lunch, she said the food was old.  Her head was down with mumbling at times.  Minimal information obtained from the patient.  Based on previous presentations, she is not at her baseline.  Collateral was obtained by the mother Issabela Archacki [id number]), who expresses concerns about the patient's psychotic behaviors. Ms. Friddle disclosed that the patient started having hallucinations yesterday and yelling.    HPI: Per NP on admission:  Tamara [name] is a 48 y.o. female patient brought in by her mother.  Mother reports she has been taking her medicines but for the last 3 weeks has not been wanting to bathe or change her clothes.  She seems to be getting more paranoid.  Here in the emergency room the patient appears to be very scared not answering questions possibly responding to internal stimuli still difficult to tell.  Past Psychiatric History:  Schizophrenia  Risk to Self: Suicidal Ideation: (UTA) Risk to Others: Does patient have a court date: No Prior Inpatient Therapy: Prior Inpatient Therapy: Yes Prior Therapy Facilty/Provider(s): [facility] Prior Outpatient Therapy: Prior Outpatient Therapy: Yes Prior Therapy Dates: Current Prior Therapy Facilty/Provider(s): [facility] Reason for Treatment: Schizophrenia Does patient have an ACCT team?: No Does patient have Intensive In-House Services?  : No Does patient have [facility] services? : No Does patient have P4CC services?: No  Past Medical History:  Past Medical History:  Diagnosis Date  . GERD (gastroesophageal reflux disease)   . IUD (intrauterine device) in place IUD was placed in 2016  . Leiomyoma of uterus   . Schizophrenia ([place])   . Thyroid disease    History reviewed. No pertinent surgical history. Family History: No family history on file. Family Psychiatric  History: No pertinent family psychiatric history Social History:  Social History   Substance and Sexual Activity  Alcohol Use No     Social History   Substance and Sexual Activity  Drug Use No    Social History   Socioeconomic History  . Marital status: Single    Spouse name: Not on file  . Number of children: Not on file  . Years of education: Not on file  . Highest education level: Not on file  Occupational History  . Not on file  Social Needs  . Financial resource strain: Not on file  . Food insecurity    Worry: Not on file    Inability: Not on file  . Transportation needs    Medical: Not on file    Non-medical: Not on file  Tobacco Use  . Smoking status: Never Smoker  . Smokeless tobacco: Never Used  Substance and Sexual Activity  . Alcohol use: No  . Drug use: No  . Sexual activity: Yes    Birth control/protection: Surgical  Lifestyle  . Physical activity    Days per  week: Not on file    Minutes per session: Not on file  . Stress: Not on file  Relationships  . Social Herbalist on phone: Not on file    Gets together: Not on file    Attends religious service: Not on file    Active member of club or organization: Not on file    Attends meetings of clubs or organizations: Not on file    Relationship status: Not on file  Other Topics Concern  . Not on file  Social  History Narrative  . Not on file   Additional Social History:    Allergies:  No Known Allergies  Labs:  No results found for this or any previous visit (from the past 48 hour(s)).  Current Facility-Administered Medications  Medication Dose Route Frequency Provider Last Rate Last Dose  . ARIPiprazole (ABILIFY) tablet 10 mg  10 mg Oral QHS Patrecia Pour, NP      . benztropine (COGENTIN) tablet 1 mg  1 mg Oral Daily Patrecia Pour, NP      . diphenhydrAMINE (BENADRYL) capsule 25 mg  25 mg Oral QHS Patrecia Pour, NP      . divalproex (DEPAKOTE ER) 24 hr tablet 500 mg  500 mg Oral BID Patrecia Pour, NP      . haloperidol (HALDOL) tablet 5 mg  5 mg Oral BID Patrecia Pour, NP       Or  . haloperidol lactate (HALDOL) injection 5 mg  5 mg Intramuscular BID Patrecia Pour, NP      . Derrill Memo ON 03/27/2019] levothyroxine (SYNTHROID) tablet 88 mcg  88 mcg Oral QAC breakfast Patrecia Pour, NP      . loratadine (CLARITIN) tablet 10 mg  10 mg Oral Daily Areyana Leoni Y, NP      . LORazepam (ATIVAN) tablet 1 mg  1 mg Oral BID PRN Patrecia Pour, NP       Current Outpatient Medications  Medication Sig Dispense Refill  . ARIPiprazole (ABILIFY) 10 MG tablet Take 10 mg by mouth at bedtime.    Marland Kitchen BANOPHEN 25 MG capsule Take 25 mg by mouth at bedtime.    . cetirizine (ZYRTEC) 10 MG tablet Take 1 tablet (10 mg total) by mouth daily as needed for allergies. (Patient taking differently: Take 10 mg by mouth daily. )    . divalproex (DEPAKOTE ER) 250 MG 24 hr tablet Take 375 mg by mouth at bedtime.     . divalproex (DEPAKOTE ER) 500 MG 24 hr tablet Take 1 tablet (500 mg total) by mouth every 12 (twelve) hours. (Patient taking differently: Take 750 mg by mouth at bedtime. ) 60 tablet 1  . haloperidol (HALDOL) 5 MG tablet Take 1 tablet (5 mg total) by mouth 2 (two) times daily as needed for agitation. 60 tablet 1  . haloperidol decanoate (HALDOL DECANOATE) 100 MG/ML injection Inject 100 mg into the  muscle every 28 (twenty-eight) days.    Marland Kitchen levothyroxine (SYNTHROID, LEVOTHROID) 88 MCG tablet Take 88 mcg by mouth daily before breakfast.    . LORazepam (ATIVAN) 1 MG tablet Take 1 tablet (1 mg total) by mouth 2 (two) times daily as needed for anxiety (agitation). 30 tablet 0    Musculoskeletal: Strength & Muscle Tone: within normal limits Gait & Station: normal Patient leans: N/A  Psychiatric Specialty Exam: Physical Exam  Nursing note and vitals reviewed. Constitutional: She appears well-developed.  HENT:  Head: Normocephalic.  Eyes: Pupils are equal, round, and reactive to light.  Neck: Normal range of motion.  Cardiovascular: Normal rate.  Respiratory: Effort normal.  Musculoskeletal: Normal range of motion.  Neurological: She is alert.  Skin: Skin is warm and dry.  Psychiatric: Her speech is normal. Her mood appears anxious. Her affect is blunt. She is actively hallucinating. Thought content is paranoid. Cognition and memory are impaired. She expresses impulsivity.    Review of Systems  Psychiatric/Behavioral: Positive for hallucinations. The patient is nervous/anxious.   All other systems reviewed and are negative.   Blood pressure 139/74, pulse (!) 108, temperature 98.7 F (37.1 C), resp. rate (!) 22, weight 61.2 kg, SpO2 100 %.Body mass index is 24.68 kg/m.  General Appearance: Casual  Eye Contact:  Good  Speech:  Normal  Volume:  Normal  Mood:  Anxious at times  Affect: Blunt  Thought Process:  Logical  Orientation:  Other:  Alert and oriented x1  Thought Content: Hallucinations  Suicidal Thoughts:  No  Homicidal Thoughts:  No  Memory:  Fair  Judgement:  Fair   Insight:  Lacking  Psychomotor Activity:  Decreased  Concentration:  Fair  Recall:  [place] of Knowledge:  Poor to fair  Language:  Fair  Akathisia:  Negative  Handed:  Right  AIMS (if indicated):     Assets:  Desire for Improvement Leisure Time Social Support  ADL's:  Impaired  Cognition:   Impaired, moderate  Sleep:   Insomnia    Treatment Plan Summary: Schizophrenia: -Continue Haldol 5 mg BID oral or IM.   -Last Haldol deck was on 10/7 -Restarted Depakote at 500 mg BID -Restarted Abilify 10 mg at bedtime  EPS: -Started Cogentin 1 mg daily  Anxiety/catatonia: -Restarted Ativan 2 mg BID  Insomnia: -Restarted benadryl 25 mg at bedtime PRN  Disposition: Reevaluate in the am for stability to return home  Waylan Boga, NP 03/26/2019 10:04 AM

## 2019-03-26 NOTE — ED Notes (Signed)
Report to include Situation, Background, Assessment, and Recommendations received from Amy B. RN. Patient alert and oriented, warm and dry, in no acute distress. Patient denies SI, HI, AVH and pain. Patient made aware of Q15 minute rounds and security cameras for their safety. Patient instructed to come to me with needs or concerns. 

## 2019-03-27 DIAGNOSIS — F202 Catatonic schizophrenia: Secondary | ICD-10-CM

## 2019-03-27 MED ORDER — HALOPERIDOL 5 MG PO TABS: ORAL_TABLET | ORAL | 2 refills | Status: DC

## 2019-03-27 MED ORDER — BENZTROPINE MESYLATE 1 MG PO TABS: 1.0000 mg | ORAL_TABLET | Freq: Two times a day (BID) | ORAL | 3 refills | Status: DC

## 2019-03-27 MED ORDER — BENZTROPINE MESYLATE 1 MG PO TABS: 1.0000 mg | ORAL_TABLET | Freq: Two times a day (BID) | ORAL | Status: DC

## 2019-03-27 MED ORDER — LEVOTHYROXINE SODIUM 88 MCG PO TABS: 88.0000 ug | ORAL_TABLET | Freq: Every day | ORAL | 2 refills | Status: DC

## 2019-03-27 MED ORDER — DIVALPROEX SODIUM ER 500 MG PO TB24: 500.0000 mg | ORAL_TABLET | Freq: Two times a day (BID) | ORAL | 2 refills | Status: DC

## 2019-03-27 MED ORDER — LEVOTHYROXINE SODIUM 88 MCG PO TABS: 88.0000 ug | ORAL_TABLET | Freq: Every day | ORAL | 1 refills | Status: DC

## 2019-03-27 MED ORDER — BENZTROPINE MESYLATE 1 MG PO TABS: 1.0000 mg | ORAL_TABLET | Freq: Two times a day (BID) | ORAL | 2 refills | Status: DC

## 2019-03-27 MED ORDER — DIVALPROEX SODIUM ER 500 MG PO TB24: 500.0000 mg | ORAL_TABLET | Freq: Two times a day (BID) | ORAL | 3 refills | Status: DC

## 2019-03-27 MED ORDER — HALOPERIDOL 5 MG PO TABS: ORAL_TABLET | ORAL | 3 refills | Status: DC

## 2019-03-27 NOTE — ED Notes (Signed)
Hourly rounding reveals patient sleeping in room. No complaints, stable, in no acute distress. Q15 minute rounds and monitoring via Security Cameras to continue. 

## 2019-03-27 NOTE — ED Notes (Signed)
Pt discharged home with mother (legal guardian).  VS stable. All belongings returned to patient. Pt denies SI/HI. Discharge instructions and  prescriptions reviewed with pt's mother. Pt's mother signed hard copy for discharge.

## 2019-03-27 NOTE — Discharge Summary (Signed)
Physician Discharge Summary Note  Patient:  Tamara [name] is an 48 y.o., female MRN:  [id number] DOB:  Oct 20, 1970 Patient phone:  [phone] (home)  Patient address:   [place] [address],  Total Time spent with patient: 45 minutes  Date of Admission:  03/25/2019 Date of Discharge: 03/27/2019  Reason for Admission:    This is the latest of multiple admissions/ER encounters for Tamara [name], 48 year old patient with a known schizophrenic type condition, complicated by chronic poor compliance despite long-acting injectable haloperidol. On presentation she was described as noncompliant with medications and responding to stimuli  Principal Problem: Schizophrenia, paranoid type [facility]) Discharge Diagnoses: Principal Problem:   Schizophrenia, paranoid type ([place])   Past Psychiatric History: See HPI  Past Medical History:  Past Medical History:  Diagnosis Date  . GERD (gastroesophageal reflux disease)   . IUD (intrauterine device) in place IUD was placed in 2016  . Leiomyoma of uterus   . Schizophrenia ([place])   . Thyroid disease    History reviewed. No pertinent surgical history. Family History: No family history on file. Family Psychiatric  History: See HPI Social History:  Social History   Substance and Sexual Activity  Alcohol Use No     Social History   Substance and Sexual Activity  Drug Use No    Social History   Socioeconomic History  . Marital status: Single    Spouse name: Not on file  . Number of children: Not on file  . Years of education: Not on file  . Highest education level: Not on file  Occupational History  . Not on file  Social Needs  . Financial resource strain: Not on file  . Food insecurity    Worry: Not on file    Inability: Not on file  . Transportation needs    Medical: Not on file    Non-medical: Not on file  Tobacco Use  . Smoking status: Never Smoker  . Smokeless tobacco: Never Used  Substance and Sexual  Activity  . Alcohol use: No  . Drug use: No  . Sexual activity: Yes    Birth control/protection: Surgical  Lifestyle  . Physical activity    Days per week: Not on file    Minutes per session: Not on file  . Stress: Not on file  Relationships  . Social Herbalist on phone: Not on file    Gets together: Not on file    Attends religious service: Not on file    Active member of club or organization: Not on file    Attends meetings of clubs or organizations: Not on file    Relationship status: Not on file  Other Topics Concern  . Not on file  Social History Narrative  . Not on file    Hospital Course:    Once in the ED the patient was compliant with her medications and she displayed no dangerous behaviors.  We did escalate her haloperidol and discontinued the aripiprazole due to some akathisia and EPS noted in the hands.  On the date of the first she was noted to be alert and cooperative with me had a mild tremor but no thoughts of harming self or others and denied auditory or visual hallucinations she knew the day but not the date was overall cooperative. Again no thoughts of harming self or others positive symptoms seem resolved with compliance  Physical Findings: AIMS:  , ,  ,  ,  CIWA:    COWS:     Musculoskeletal: Strength & Muscle Tone: within normal limits Gait & Station: normal Patient leans: N/A  Psychiatric Specialty Exam: Physical Exam  ROS  Blood pressure (!) 146/64, pulse (!) 103, temperature 98 F (36.7 C), temperature source Oral, resp. rate 16, weight 61.2 kg, SpO2 100 %.Body mass index is 24.68 kg/m.  General Appearance: Casual  Eye Contact:  Minimal  Speech:  Slow and Slurred  Volume:  Decreased  Mood:  Anxious  Affect:  Blunt  Thought Process:  Goal Directed and Descriptions of Associations: Circumstantial  Orientation:  Other:  Person place situation day  Thought Content:  Denies auditory or visual hallucinations  Suicidal Thoughts:   No  Homicidal Thoughts:  No  Memory:  Immediate;   Fair Recent;   Fair Remote;   Good  Judgement:  Fair  Insight:  Fair  Psychomotor Activity:  EPS  Concentration:  Concentration: Fair and Attention Span: Poor  Recall:  [place] of Knowledge:  Poor  Language:  Generally coherent goal-directed speech  Akathisia:  Negative  Handed:  Right  AIMS (if indicated):     Assets:  Communication Skills Desire for Improvement  ADL's:  Intact  Cognition:  WNL  Sleep:           Has this patient used any form of tobacco in the last 30 days? (Cigarettes, Smokeless Tobacco, Cigars, and/or Pipes) Yes, No  Blood Alcohol level:  Lab Results  Component Value Date   ETH <10 03/23/2019   ETH <10 [id number]    Metabolic Disorder Labs:  Lab Results  Component Value Date   HGBA1C 5.5 09/11/2015   Lab Results  Component Value Date   PROLACTIN 163.4 (H) 09/11/2015   Lab Results  Component Value Date   CHOL 240 (H) 09/11/2015   TRIG 145 09/11/2015   HDL 84 09/11/2015   CHOLHDL 2.9 09/11/2015   VLDL 29 09/11/2015   LDLCALC 127 (H) 09/11/2015    See Psychiatric Specialty Exam and Suicide Risk Assessment completed by Attending Physician prior to discharge.  Discharge destination:  Home  Is patient on multiple antipsychotic therapies at discharge:  No   Has Patient had three or more failed trials of antipsychotic monotherapy by history:  No  Recommended Plan for Multiple Antipsychotic Therapies: NA   Allergies as of 03/27/2019   No Known Allergies     Medication List    STOP taking these medications   ARIPiprazole 10 MG tablet Commonly known as: ABILIFY     TAKE these medications     Indication  Banophen 25 mg capsule Generic drug: diphenhydrAMINE Take 25 mg by mouth at bedtime.    benztropine 1 MG tablet Commonly known as: COGENTIN Take 1 tablet (1 mg total) by mouth 2 (two) times daily.  Indication: Extrapyramidal Reaction caused by Medications   cetirizine 10  MG tablet Commonly known as: ZYRTEC Take 1 tablet (10 mg total) by mouth daily as needed for allergies. What changed: when to take this    divalproex 500 MG 24 hr tablet Commonly known as: DEPAKOTE ER Take 1 tablet (500 mg total) by mouth 2 (two) times daily. What changed:   when to take this  Another medication with the same name was removed. Continue taking this medication, and follow the directions you see here.  Indication: Manic Phase of Manic-Depression, mood stabilization   haloperidol 5 MG tablet Commonly known as: HALDOL 1 in am 2 at hs What changed:  how much to take  how to take this  when to take this  reasons to take this  additional instructions  Indication: Psychosis   haloperidol decanoate 100 MG/ML injection Commonly known as: HALDOL DECANOATE Inject 100 mg into the muscle every 28 (twenty-eight) days.    levothyroxine 88 MCG tablet Commonly known as: SYNTHROID Take 1 tablet (88 mcg total) by mouth daily before breakfast.  Indication: Underactive Thyroid   LORazepam 1 MG tablet Commonly known as: ATIVAN Take 1 tablet (1 mg total) by mouth 2 (two) times daily as needed for anxiety (agitation).  Indication: Feeling Anxious, agitation        Medications are refilled escalation of Haldol to 5 in the morning and 10 mg at bedtime discontinuation of aripiprazole due to EPS continue Haldol Decanoate   SignedJohnn Hai, MD 03/27/2019, 10:16 AM

## 2019-03-27 NOTE — ED Provider Notes (Signed)
-----------------------------------------   6:00 AM on 03/27/2019 -----------------------------------------   Blood pressure (!) 146/64, pulse (!) 103, temperature 98 F (36.7 C), temperature source Oral, resp. rate 16, weight 61.2 kg, SpO2 100 %.  The patient is sleeping at this time.  There have been no acute events since the last update.  Awaiting disposition plan from Behavioral Medicine and/or Social Work team(s).   Paulette Blanch, MD 03/27/19 [phone]

## 2019-03-27 NOTE — ED Notes (Signed)
IVC/PENDING REEVALUATION THIS AM FOR STABILITY TO RETURN HOME.

## 2019-03-27 NOTE — BHH Suicide Risk Assessment (Signed)
[facility] Discharge Suicide Risk Assessment   Principal Problem: Schizophrenia, paranoid type ([place]) Discharge Diagnoses: Principal Problem:   Schizophrenia, paranoid type ([place])   Total Time spent with patient: 45 minutes Mental Status Per Nursing Assessment::   On Admission:    Musculoskeletal: Strength & Muscle Tone: within normal limits Gait & Station: normal Patient leans: N/A  Psychiatric Specialty Exam: Physical Exam  ROS  Blood pressure (!) 146/64, pulse (!) 103, temperature 98 F (36.7 C), temperature source Oral, resp. rate 16, weight 61.2 kg, SpO2 100 %.Body mass index is 24.68 kg/m.  General Appearance: Casual  Eye Contact:  Minimal  Speech:  Slow and Slurred  Volume:  Decreased  Mood:  Anxious  Affect:  Blunt  Thought Process:  Goal Directed and Descriptions of Associations: Circumstantial  Orientation:  Other:  Person place situation day  Thought Content:  Denies auditory or visual hallucinations  Suicidal Thoughts:  No  Homicidal Thoughts:  No  Memory:  Immediate;   Fair Recent;   Fair Remote;   Good  Judgement:  Fair  Insight:  Fair  Psychomotor Activity:  EPS  Concentration:  Concentration: Fair and Attention Span: Poor  Recall:  [employer] of Knowledge:  Poor  Language:  Generally coherent goal-directed speech  Akathisia:  Negative  Handed:  Right  AIMS (if indicated):     Assets:  Communication Skills Desire for Improvement  ADL's:  Intact  Cognition:  WNL  Sleep:       Demographic Factors:  Unemployed  Loss Factors: NA  Historical Factors: NA  Risk Reduction Factors:   Sense of responsibility to family  Continued Clinical Symptoms:  Previous Psychiatric Diagnoses and Treatments  Cognitive Features That Contribute To Risk:  Loss of executive function    Suicide Risk:  Minimal: No identifiable suicidal ideation.  Patients presenting with no risk factors but with morbid ruminations; may be classified as minimal risk based on the  severity of the depressive symptoms    Plan Of Care/Follow-up recommendations:  Activity:  full  Briget Shaheed, MD 03/27/2019, 10:19 AM

## 2019-03-27 NOTE — ED Notes (Signed)
Meal tray placed in room 

## 2019-05-21 ENCOUNTER — Other Ambulatory Visit (HOSPITAL_COMMUNITY): Payer: Self-pay | Admitting: Psychiatry

## 2019-06-05 ENCOUNTER — Encounter: Payer: Self-pay | Admitting: Emergency Medicine

## 2019-06-05 ENCOUNTER — Emergency Department
Admission: EM | Admit: 2019-06-05 | Discharge: 2019-06-06 | Disposition: A | Discharge status: Other Acute Inpatient Hospital | Payer: Medicaid Other | Attending: Emergency Medicine | Admitting: Emergency Medicine

## 2019-06-05 ENCOUNTER — Other Ambulatory Visit: Payer: Self-pay

## 2019-06-05 DIAGNOSIS — F70 Mild intellectual disabilities: Secondary | ICD-10-CM | POA: Insufficient documentation

## 2019-06-05 DIAGNOSIS — E876 Hypokalemia: Secondary | ICD-10-CM | POA: Diagnosis not present

## 2019-06-05 DIAGNOSIS — Z79899 Other long term (current) drug therapy: Secondary | ICD-10-CM | POA: Diagnosis not present

## 2019-06-05 DIAGNOSIS — F200 Paranoid schizophrenia: Secondary | ICD-10-CM | POA: Diagnosis present

## 2019-06-05 DIAGNOSIS — Z20822 Contact with and (suspected) exposure to covid-19: Secondary | ICD-10-CM | POA: Insufficient documentation

## 2019-06-05 DIAGNOSIS — E871 Hypo-osmolality and hyponatremia: Secondary | ICD-10-CM

## 2019-06-05 DIAGNOSIS — Z046 Encounter for general psychiatric examination, requested by authority: Secondary | ICD-10-CM | POA: Insufficient documentation

## 2019-06-05 DIAGNOSIS — R440 Auditory hallucinations: Secondary | ICD-10-CM | POA: Diagnosis present

## 2019-06-05 DIAGNOSIS — R443 Hallucinations, unspecified: Secondary | ICD-10-CM

## 2019-06-05 DIAGNOSIS — F209 Schizophrenia, unspecified: Secondary | ICD-10-CM | POA: Diagnosis not present

## 2019-06-05 LAB — URINE DRUG SCREEN, QUALITATIVE (ARMC ONLY)
Amphetamines, Ur Screen: NOT DETECTED
Barbiturates, Ur Screen: NOT DETECTED
Benzodiazepine, Ur Scrn: NOT DETECTED
Cannabinoid 50 Ng, Ur ~~LOC~~: NOT DETECTED
Cocaine Metabolite,Ur ~~LOC~~: NOT DETECTED
MDMA (Ecstasy)Ur Screen: NOT DETECTED
Methadone Scn, Ur: NOT DETECTED
Opiate, Ur Screen: NOT DETECTED
Phencyclidine (PCP) Ur S: NOT DETECTED
Tricyclic, Ur Screen: NOT DETECTED

## 2019-06-05 LAB — CBC WITH DIFFERENTIAL/PLATELET
Abs Immature Granulocytes: 0.05 10*3/uL (ref 0.00–0.07)
Basophils Absolute: 0 10*3/uL (ref 0.0–0.1)
Basophils Relative: 0 %
Eosinophils Absolute: 0 10*3/uL (ref 0.0–0.5)
Eosinophils Relative: 0 %
HCT: 36.6 % (ref 36.0–46.0)
Hemoglobin: 12.8 g/dL (ref 12.0–15.0)
Immature Granulocytes: 1 %
Lymphocytes Relative: 24 %
Lymphs Abs: 2 10*3/uL (ref 0.7–4.0)
MCH: 31.4 pg (ref 26.0–34.0)
MCHC: 35 g/dL (ref 30.0–36.0)
MCV: 89.7 fL (ref 80.0–100.0)
Monocytes Absolute: 1.5 10*3/uL — ABNORMAL HIGH (ref 0.1–1.0)
Monocytes Relative: 18 %
Neutro Abs: 4.9 10*3/uL (ref 1.7–7.7)
Neutrophils Relative %: 57 %
Platelets: 239 10*3/uL (ref 150–400)
RBC: 4.08 MIL/uL (ref 3.87–5.11)
RDW: 12.2 % (ref 11.5–15.5)
WBC: 8.5 10*3/uL (ref 4.0–10.5)
nRBC: 0 % (ref 0.0–0.2)

## 2019-06-05 LAB — COMPREHENSIVE METABOLIC PANEL
ALT: 10 U/L (ref 0–44)
ALT: 7 U/L (ref 0–44)
AST: 19 U/L (ref 15–41)
AST: 15 U/L (ref 15–41)
Albumin: 3.9 g/dL (ref 3.5–5.0)
Albumin: 3.6 g/dL (ref 3.5–5.0)
Alkaline Phosphatase: 71 U/L (ref 38–126)
Alkaline Phosphatase: 64 U/L (ref 38–126)
Anion gap: 14 (ref 5–15)
Anion gap: 8 (ref 5–15)
BUN: <5 mg/dL — ABNORMAL LOW (ref 6–20)
BUN: <5 mg/dL — ABNORMAL LOW (ref 6–20)
CO2: 25 mmol/L (ref 22–32)
CO2: 26 mmol/L (ref 22–32)
Calcium: 8.8 mg/dL — ABNORMAL LOW (ref 8.9–10.3)
Calcium: 8.7 mg/dL — ABNORMAL LOW (ref 8.9–10.3)
Chloride: 84 mmol/L — ABNORMAL LOW (ref 98–111)
Chloride: 93 mmol/L — ABNORMAL LOW (ref 98–111)
Creatinine, Ser: 0.55 mg/dL (ref 0.44–1.00)
Creatinine, Ser: 0.51 mg/dL (ref 0.44–1.00)
GFR calc Af Amer: >60 mL/min (ref >60)
GFR calc Af Amer: >60 mL/min (ref >60)
GFR calc non Af Amer: >60 mL/min (ref >60)
GFR calc non Af Amer: >60 mL/min (ref >60)
Glucose, Bld: 107 mg/dL — ABNORMAL HIGH (ref 70–99)
Glucose, Bld: 90 mg/dL (ref 70–99)
Potassium: 2.7 mmol/L — CL (ref 3.5–5.1)
Potassium: 4.8 mmol/L (ref 3.5–5.1)
Sodium: 123 mmol/L — ABNORMAL LOW (ref 135–145)
Sodium: 127 mmol/L — ABNORMAL LOW (ref 135–145)
Total Bilirubin: 0.5 mg/dL (ref 0.3–1.2)
Total Bilirubin: 0.6 mg/dL (ref 0.3–1.2)
Total Protein: 8.2 g/dL — ABNORMAL HIGH (ref 6.5–8.1)
Total Protein: 7.1 g/dL (ref 6.5–8.1)

## 2019-06-05 LAB — BASIC METABOLIC PANEL
Anion gap: 7 (ref 5–15)
BUN: <5 mg/dL — ABNORMAL LOW (ref 6–20)
CO2: 24 mmol/L (ref 22–32)
Calcium: 8.1 mg/dL — ABNORMAL LOW (ref 8.9–10.3)
Chloride: 101 mmol/L (ref 98–111)
Creatinine, Ser: 0.51 mg/dL (ref 0.44–1.00)
GFR calc Af Amer: >60 mL/min (ref >60)
GFR calc non Af Amer: >60 mL/min (ref >60)
Glucose, Bld: 167 mg/dL — ABNORMAL HIGH (ref 70–99)
Potassium: 3.5 mmol/L (ref 3.5–5.1)
Sodium: 132 mmol/L — ABNORMAL LOW (ref 135–145)

## 2019-06-05 LAB — TSH: TSH: 3.157 u[IU]/mL (ref 0.350–4.500)

## 2019-06-05 LAB — ACETAMINOPHEN LEVEL: Acetaminophen (Tylenol), Serum: <10 ug/mL — ABNORMAL LOW (ref 10–30)

## 2019-06-05 LAB — PREGNANCY, URINE: Preg Test, Ur: NEGATIVE

## 2019-06-05 LAB — T4, FREE: Free T4: 1.43 ng/dL — ABNORMAL HIGH (ref 0.61–1.12)

## 2019-06-05 LAB — MAGNESIUM: Magnesium: 1.8 mg/dL (ref 1.7–2.4)

## 2019-06-05 LAB — SALICYLATE LEVEL: Salicylate Lvl: <7 mg/dL — ABNORMAL LOW (ref 7.0–30.0)

## 2019-06-05 LAB — ETHANOL: Alcohol, Ethyl (B): <10 mg/dL (ref <10)

## 2019-06-05 MED ORDER — LORAZEPAM 1 MG PO TABS: 1.0000 mg | ORAL_TABLET | Freq: Two times a day (BID) | ORAL | Status: DC | PRN

## 2019-06-05 MED ORDER — POTASSIUM CHLORIDE 10 MEQ/100ML IV SOLN
10.0000 meq | INTRAVENOUS | Status: AC
  Administered 2019-06-05 (×2): 10 meq via INTRAVENOUS
  Filled 2019-06-05 (×2): qty 100

## 2019-06-05 MED ORDER — RISPERIDONE 1 MG PO TABS: 2.0000 mg | ORAL_TABLET | Freq: Two times a day (BID) | ORAL | Status: DC

## 2019-06-05 MED ORDER — POTASSIUM CHLORIDE CRYS ER 20 MEQ PO TBCR
40.0000 meq | EXTENDED_RELEASE_TABLET | Freq: Once | ORAL | Status: AC
  Administered 2019-06-05: 40 meq via ORAL
  Filled 2019-06-05: qty 2

## 2019-06-05 MED ORDER — BENZTROPINE MESYLATE 1 MG PO TABS
1.0000 mg | ORAL_TABLET | Freq: Two times a day (BID) | ORAL | Status: DC
  Administered 2019-06-05 – 2019-06-06 (×2): 1 mg via ORAL
  Filled 2019-06-05 (×2): qty 1

## 2019-06-05 MED ORDER — HALOPERIDOL 5 MG PO TABS
5.0000 mg | ORAL_TABLET | Freq: Two times a day (BID) | ORAL | Status: DC
  Administered 2019-06-05 – 2019-06-06 (×2): 5 mg via ORAL
  Filled 2019-06-05 (×2): qty 1

## 2019-06-05 MED ORDER — HALOPERIDOL 5 MG PO TABS: 5.0000 mg | ORAL_TABLET | Freq: Two times a day (BID) | ORAL | Status: DC

## 2019-06-05 MED ORDER — LORAZEPAM 2 MG/ML IJ SOLN: 1.0000 mg | Freq: Once | INTRAMUSCULAR | Status: DC

## 2019-06-05 MED ORDER — SODIUM CHLORIDE 0.9 % IV BOLUS
1000.0000 mL | Freq: Once | INTRAVENOUS | Status: AC
  Administered 2019-06-05: 1000 mL via INTRAVENOUS

## 2019-06-05 MED ORDER — DIVALPROEX SODIUM ER 250 MG PO TB24
500.0000 mg | ORAL_TABLET | Freq: Two times a day (BID) | ORAL | Status: DC
  Administered 2019-06-05 – 2019-06-06 (×2): 500 mg via ORAL
  Filled 2019-06-05 (×2): qty 2

## 2019-06-05 MED ORDER — LORAZEPAM 2 MG/ML IJ SOLN
2.0000 mg | Freq: Once | INTRAMUSCULAR | Status: AC
  Administered 2019-06-05: 2 mg via INTRAVENOUS
  Filled 2019-06-05: qty 1

## 2019-06-05 MED ORDER — LEVOTHYROXINE SODIUM 88 MCG PO TABS
88.0000 ug | ORAL_TABLET | Freq: Every day | ORAL | Status: DC
  Administered 2019-06-06: 88 ug via ORAL
  Filled 2019-06-05: qty 1

## 2019-06-05 NOTE — BH Assessment (Signed)
Assessment Note  Tamara [name] is an 49 y.o. female who presents to ED with history of schizophrenia. This writer was unable to appropriately assess pt as she would mumble and speak very low in tone. She gave permission for this writer to speak with her mother to obtain further information.  Per HPI: female with schizophrenia who comes in with sister for concerns of worsening psychosis.  [name] been compliant with her meds that she is becoming more psychotic.  Worsening hallucinations are severe, intermittent, nothing makes it better, nothing makes them worse.  They but they seem to be mostly auditory but also visual as well.  Denies that she had any SI or HI.  She has become more manic. Seems to be eating and drinking okay.  Denies any cough that she has noticed.   Collateral obtained from patient's mother Tamara [name]: [id number]) who reports pt "has not been acting like herself since her doctor changed her medications". She reports pt has not been bathing, she's been "talking outside of her head", and responding to internal stimuli.   Diagnosis: Schizophrenia, by history  Past Medical History:  Past Medical History:  Diagnosis Date  . GERD (gastroesophageal reflux disease)   . IUD (intrauterine device) in place IUD was placed in 2016  . Leiomyoma of uterus   . Schizophrenia ([place])   . Thyroid disease     History reviewed. No pertinent surgical history.  Family History: History reviewed. No pertinent family history.  Social History:  reports that she has never smoked. She has never used smokeless tobacco. She reports that she does not drink alcohol or use drugs.  Additional Social History:  Alcohol / Drug Use Pain Medications: See MAR Prescriptions: See MAR Over the Counter: See MAR History of alcohol / drug use?: No history of alcohol / drug abuse  CIWA: CIWA-Ar BP: 125/73 Pulse Rate: 98 COWS:    Allergies: No Known Allergies  Home Medications: (Not in a hospital  admission)   OB/GYN Status:  No LMP recorded. (Menstrual status: IUD).  General Assessment Data Location of Assessment: [facility] ED TTS Assessment: In system Is this a Tele or Face-to-Face Assessment?: Face-to-Face Is this an Initial Assessment or a Re-assessment for this encounter?: Initial Assessment Patient Accompanied by:: N/A Language Other than English: No Living Arrangements: Other (Comment)(Private Residence) What gender do you identify as?: Female Marital status: Single Maiden name: N/A Pregnancy Status: No Living Arrangements: Parent Can pt return to current living arrangement?: Yes Admission Status: Involuntary Petitioner: ED Attending Is patient capable of signing voluntary admission?: No Referral Source: Self/Family/Friend Insurance type: Medicaid  Medical Screening Exam ([place]) Medical Exam completed: Yes  Crisis Care Plan Living Arrangements: Parent Legal Guardian: Mother Name of Psychiatrist: Myer Haff Name of Therapist: [facility]  Education Status Is patient currently in school?: No Is the patient employed, unemployed or receiving disability?: Receiving disability income  Risk to self with the past 6 months Suicidal Ideation: No Has patient been a risk to self within the past 6 months prior to admission? : No Suicidal Intent: No Has patient had any suicidal intent within the past 6 months prior to admission? : No Is patient at risk for suicide?: No Suicidal Plan?: No Has patient had any suicidal plan within the past 6 months prior to admission? : No Access to Means: No What has been your use of drugs/alcohol within the last 12 months?: None Previous Attempts/Gestures: No How many times?: 0 Other Self Harm Risks: None Triggers for Past  Attempts: None known Intentional Self Injurious Behavior: None Family Suicide History: Unknown Recent stressful life event(s): Other (Comment)(SPMI) Persecutory voices/beliefs?: No Depression: No Depression Symptoms:  (None) Substance abuse history and/or treatment for substance abuse?: No Suicide prevention information given to non-admitted patients: Not applicable  Risk to Others within the past 6 months Homicidal Ideation: No Does patient have any lifetime risk of violence toward others beyond the six months prior to admission? : No Thoughts of Harm to Others: No Current Homicidal Intent: No Current Homicidal Plan: No Access to Homicidal Means: No Identified Victim: None History of harm to others?: No Assessment of Violence: None Noted Violent Behavior Description: None Does patient have access to weapons?: No Criminal Charges Pending?: No Does patient have a court date: No Is patient on probation?: No  Psychosis Hallucinations: Auditory(Pt denied; however, she has hx of auditory hallucinations) Delusions: None noted  Mental Status Report Appearance/Hygiene: In scrubs Eye Contact: Poor Motor Activity: Freedom of movement Speech: Soft, Incoherent Level of Consciousness: Alert Mood: Pleasant Affect: Blunted Anxiety Level: Minimal Thought Processes: Unable to Assess Judgement: Unable to Assess Orientation: Unable to assess Obsessive Compulsive Thoughts/Behaviors: Unable to Assess  Cognitive Functioning Concentration: Unable to Assess Memory: Unable to Assess Is patient IDD: Yes Level of Function: Below 14 Is IQ score available?: No Insight: Unable to Assess Impulse Control: Unable to Assess Appetite: (UTA) Have you had any weight changes? : (UTA) Sleep: Unable to Assess Total Hours of Sleep: (UTA) Vegetative Symptoms: Unable to Assess  ADLScreening [facility] Assessment Services) [name] cognitive ability adequate to safely complete daily activities?: Yes Patient able to express need for assistance with ADLs?: Yes Independently performs ADLs?: Yes (appropriate for developmental age)  Prior Inpatient Therapy Prior Inpatient Therapy: Yes Prior Therapy Dates: Multiple  Hospitalizations Prior Therapy Facilty/Provider(s): [facility] Reason for Treatment: Schizophrenia  Prior Outpatient Therapy Prior Outpatient Therapy: Myer Haff)  ADL Screening (condition at time of admission) [name] cognitive ability adequate to safely complete daily activities?: Yes Patient able to express need for assistance with ADLs?: Yes Independently performs ADLs?: Yes (appropriate for developmental age)       Abuse/Neglect Assessment (Assessment to be complete while patient is alone) Abuse/Neglect Assessment Can Be Completed: Yes Physical Abuse: Denies Verbal Abuse: Denies Sexual Abuse: Denies Exploitation of patient/patient's resources: Denies Self-Neglect: Denies Values / Beliefs Cultural Requests During Hospitalization: None Spiritual Requests During Hospitalization: None Consults Spiritual Care Consult Needed: No Transition of Care Team Consult Needed: No Advance Directives (For Healthcare) Does Patient Have a Medical Advance Directive?: No Would patient like information on creating a medical advance directive?: No - Patient declined       Child/Adolescent Assessment Running Away Risk: (Patient is an adult)  Disposition:  Disposition Initial Assessment Completed for this Encounter: Yes Disposition of Patient: (Pending Psych Consult)  On Site Evaluation by:   Reviewed with Physician:    Allyne Gee 06/05/2019 7:16 PM

## 2019-06-05 NOTE — ED Provider Notes (Signed)
[facility] Emergency Department Provider Note  ____________________________________________   First MD Initiated Contact with Patient 06/05/19 1306     (approximate)  I have reviewed the triage vital signs and the nursing notes.   HISTORY  Chief Complaint Psychiatric Evaluation    HPI Tamara [name] is a 49 y.o. female with schizophrenia who comes in with sister for concerns of worsening psychosis.  [name] been compliant with her meds that she is becoming more psychotic.  Worsening hallucinations are severe, intermittent, nothing makes it better, nothing makes them worse.  They but they seem to be mostly auditory but also visual as well.  Denies that she had any SI or HI.  She has become more manic.  Seems to be eating and drinking okay.  Denies any cough that she has noticed.          Past Medical History:  Diagnosis Date  . GERD (gastroesophageal reflux disease)   . IUD (intrauterine device) in place IUD was placed in 2016  . Leiomyoma of uterus   . Schizophrenia ([place])   . Thyroid disease     Patient Active Problem List   Diagnosis Date Noted  . 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 GERD (gastroesophageal reflux disease) 09/11/2015  . Seborrheic dermatitis 09/11/2015  . 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 Schizophrenia, paranoid type ([place]) 09/10/2015  . 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 No pertinent surgical history.  Prior to Admission medications   Medication Sig Start Date End Date Taking? Authorizing Provider  BANOPHEN 25 MG capsule Take 25 mg by mouth at bedtime. 12/30/18   [provider]  benztropine (COGENTIN) 1 MG tablet Take 1 tablet (1 mg total) by mouth 2 (two) times daily. 03/27/19   Johnn Hai, MD  cetirizine (ZYRTEC) 10 MG tablet Take 1 tablet (10 mg total) by mouth daily as needed for allergies. Patient taking differently: Take 10 mg by mouth daily.  09/13/15    Hildred Priest, MD  divalproex (DEPAKOTE ER) 500 MG 24 hr tablet Take 1 tablet (500 mg total) by mouth 2 (two) times daily. 03/27/19   Johnn Hai, MD  haloperidol (HALDOL) 5 MG tablet 1 in am 2 at hs 03/27/19   Johnn Hai, MD  haloperidol decanoate (HALDOL DECANOATE) 100 MG/ML injection Inject 100 mg into the muscle every 28 (twenty-eight) days.    [provider]  levothyroxine (SYNTHROID) 88 MCG tablet Take 1 tablet (88 mcg total) by mouth daily before breakfast. 03/27/19   Johnn Hai, MD  LORazepam (ATIVAN) 1 MG tablet Take 1 tablet (1 mg total) by mouth 2 (two) times daily as needed for anxiety (agitation). 02/06/19   Patrecia Pour, NP    Allergies Patient has no known allergies.  History reviewed. No pertinent family history.  Social History Social History   Tobacco Use  . Smoking status: Never Smoker  . Smokeless tobacco: Never Used  Substance Use Topics  . Alcohol use: No  . Drug use: No      Review of Systems Constitutional: No fever/chills Eyes: No visual changes. ENT: No sore throat. Cardiovascular: Denies chest pain. Respiratory: Denies shortness of breath. Gastrointestinal: No abdominal pain.  No nausea, no vomiting.  No diarrhea.  No constipation. Genitourinary: Negative for dysuria. Musculoskeletal: Negative for back pain. Skin: Negative for rash. Neurological: Negative for headaches, focal weakness or numbness. All other ROS negative ____________________________________________   PHYSICAL EXAM:  VITAL SIGNS: Blood pressure (!) 148/82, pulse (!) 106,  temperature 98.6 F (37 C), temperature source Oral, resp. rate (!) 21, height 5\' 4"  (1.626 m), weight 61.2 kg, SpO2 100 %.  Constitutional: Alert but continuously talking loud. Eyes: Conjunctivae are normal. EOMI. Head: Atraumatic. Nose: No congestion/rhinnorhea. Mouth/Throat: Mucous membranes are moist.   Neck: No stridor. Trachea Midline. FROM Cardiovascular: Normal rate,  regular rhythm. Grossly normal heart sounds.  Good peripheral circulation. Respiratory: Normal respiratory effort.  No retractions. Lungs CTAB. Gastrointestinal: Soft and nontender. No distention. No abdominal bruits.  Musculoskeletal: No lower extremity tenderness nor edema.  No joint effusions. Neurologic:  Normal speech and language. No gross focal neurologic deficits are appreciated.  Skin:  Skin is warm, dry and intact. No rash noted. Psychiatric: Patient is talking very loudly and run on sentences.  I have to mention multiple times I am the doctor before she stops talking and is starts to answer my questions.  She denies SI HI auditory visual hallucinations GU: Deferred   ____________________________________________   LABS (all labs ordered are listed, but only abnormal results are displayed)  Labs Reviewed  CBC WITH DIFFERENTIAL/PLATELET - Abnormal; Notable for the following components:      Result Value   Monocytes Absolute 1.5 (*)    All other components within normal limits  ACETAMINOPHEN LEVEL - Abnormal; Notable for the following components:   Acetaminophen (Tylenol), Serum <10 (*)    All other components within normal limits  SALICYLATE LEVEL - Abnormal; Notable for the following components:   Salicylate Lvl [id number] (*)    All other components within normal limits  ETHANOL  TSH  T4, FREE  COMPREHENSIVE METABOLIC PANEL  URINE DRUG SCREEN, QUALITATIVE ([facility] ONLY)   ____________________________________________    INITIAL IMPRESSION / ASSESSMENT AND PLAN / ED COURSE  Tamara [name] was evaluated in Emergency Department on 06/05/2019 for the symptoms described in the history of present illness. She was evaluated in the context of the global COVID-19 pandemic, which necessitated consideration that the patient might be at risk for infection with the SARS-CoV-2 virus that causes COVID-19. Institutional protocols and algorithms that pertain to the evaluation of patients at risk  for COVID-19 are in a state of rapid change based on information released by regulatory bodies including the CDC and federal and state organizations. These policies and algorithms were followed during the patient's care in the ED.    Given the collateral information from the sister will IVC patient.  Free T4 is slightly elevated but unlikely to be the cause of her symptoms.  Potassium is 2.7.  And sodium is 123.  We will give some IV fluids and repletion of potassium.   D/d includes but is not limited to psychiatric disease, behavioral/personality disorder, inadequate socioeconomic support, medical.   Patient IVC and psychiatric and TTS was consulted.  Given sodium will give 1 L of fluid and some IV and oral potassium.  Patient will be handed off to oncoming team pending repeat labs.  If not improving patient may need admission encases are contributing to her psychiatric illness.          ____________________________________________   FINAL CLINICAL IMPRESSION(S) / ED DIAGNOSES   Final diagnoses:  Hyponatremia  Hypokalemia  Hallucinations      MEDICATIONS GIVEN DURING THIS VISIT:  Medications  sodium chloride 0.9 % bolus 1,000 mL (has no administration in time range)  potassium chloride 10 mEq in 100 mL IVPB (has no administration in time range)  potassium chloride SA (KLOR-CON) CR tablet 40 mEq (has no  administration in time range)     ED Discharge Orders    None       Note:  This document was prepared using Dragon voice recognition software and may include unintentional dictation errors.   Vanessa [place], MD 06/05/19 [phone]

## 2019-06-05 NOTE — ED Triage Notes (Signed)
Brought in by sister - pt recently had her injection for psych and takes oral meds, but sister says the medication stopped working. States she has been admitted in the past for same. Pt placed in room 22 and needs medicated in order to proceed d/t psychosis. (yelling, uncooperative, unable to answer simple questions).

## 2019-06-05 NOTE — ED Notes (Signed)
Hourly rounding reveals patient in room. No complaints, stable, in no acute distress. Q15 minute rounds and monitoring via Rover and Officer to continue.   

## 2019-06-05 NOTE — ED Notes (Signed)
FIRST NURSE NOTE- pt unable to answer orientation questions. Yelling in lobby. Appears manic.  Loud pressure speech.  Uncooperative.

## 2019-06-05 NOTE — ED Notes (Signed)
Pt up independently to restroom 

## 2019-06-05 NOTE — ED Notes (Signed)
Report to include Situation, Background, Assessment, and Recommendations received from [facility]. Patient alert and oriented, warm and dry, in no acute distress. Patient denies SI, HI, AVH and pain. Patient made aware of Q15 minute rounds and Engineer, drilling presence for their safety. Patient instructed to come to me with needs or concerns.

## 2019-06-05 NOTE — Consult Note (Addendum)
[facility] Face-to-Face Psychiatry Consult   Reason for Consult:  Psychosis  Referring Physician:  EDP Patient Identification: Tamara [name] MRN:  [id number] Principal Diagnosis: Schizophrenia, paranoid type ([place]) Diagnosis:  Principal Problem:   Schizophrenia, paranoid type ([place])  Total Time spent with patient: 1 hour  Subjective:   Tamara [name] is a 49 y.o. female patient admitted with psychosis.  Patient given Ativan on admission, very effective and sleeping soundly at this time.  Collateral information from her mother, Tamara [name], out of some of her medications and haldol oral stopped two weeks ago.  This is when her symptoms started of agitation and aggression towards her family who she lives with.  They are very supportive and she is typically very sweet and easy to manage.  After her Ativan, she was cooperative and took oral medications, cooperative without issues.  Medications will be adjusted along with her potassium and other medical issues addressed for stabilization.  Her mother does state the Haldol oral and IM combination was working well along with her Depakote.  HPI per MD:  Tamara [name] is a 49 y.o. female with schizophrenia who comes in with sister for concerns of worsening psychosis.  [name] been compliant with her meds that she is becoming more psychotic.  Worsening hallucinations are severe, intermittent, nothing makes it better, nothing makes them worse.  They but they seem to be mostly auditory but also visual as well.  Denies that she had any SI or HI.  She has become more manic.  Seems to be eating and drinking okay.  Denies any cough that she has noticed.  Past Psychiatric History:  schizophrenia  Risk to Self:  none Risk to Others:  none Prior Inpatient Therapy:  yes Prior Outpatient Therapy:  yes  Past Medical History:  Past Medical History:  Diagnosis Date  . GERD (gastroesophageal reflux disease)   . IUD (intrauterine device) in place IUD was  placed in 2016  . Leiomyoma of uterus   . Schizophrenia ([place])   . Thyroid disease    History reviewed. No pertinent surgical history. Family History: History reviewed. No pertinent family history. Family Psychiatric  History: none Social History:  Social History   Substance and Sexual Activity  Alcohol Use No     Social History   Substance and Sexual Activity  Drug Use No    Social History   Socioeconomic History  . Marital status: Single    Spouse name: Not on file  . Number of children: Not on file  . Years of education: Not on file  . Highest education level: Not on file  Occupational History  . Not on file  Tobacco Use  . Smoking status: Never Smoker  . Smokeless tobacco: Never Used  Substance and Sexual Activity  . Alcohol use: No  . Drug use: No  . Sexual activity: Yes    Birth control/protection: Surgical  Other Topics Concern  . Not on file  Social History Narrative  . Not on file   Social Determinants of Health   Financial Resource Strain:   . Difficulty of Paying Living Expenses: Not on file  Food Insecurity:   . Worried About Charity fundraiser in the Last Year: Not on file  . Ran Out of Food in the Last Year: Not on file  Transportation Needs:   . Lack of Transportation (Medical): Not on file  . Lack of Transportation (Non-Medical): Not on file  Physical Activity:   . Days of  Exercise per Week: Not on file  . Minutes of Exercise per Session: Not on file  Stress:   . Feeling of Stress : Not on file  Social Connections:   . Frequency of Communication with Friends and Family: Not on file  . Frequency of Social Gatherings with Friends and Family: Not on file  . Attends Religious Services: Not on file  . Active Member of Clubs or Organizations: Not on file  . Attends Archivist Meetings: Not on file  . Marital Status: Not on file   Additional Social History:    Allergies:  No Known Allergies  Labs:  Results for orders placed or  performed during the hospital encounter of 06/05/19 (from the past 48 hour(s))  TSH     Status: None   Collection Time: 06/05/19  1:30 PM  Result Value Ref Range   TSH 3.157 0.350 - 4.500 uIU/mL    Comment: Performed by a 3rd Generation assay with a functional sensitivity of <=0.01 uIU/mL. Performed at [facility], [address] [place], [place] [address]   T4, free     Status: Abnormal   Collection Time: 06/05/19  1:30 PM  Result Value Ref Range   Free T4 1.43 (H) 0.61 - 1.12 ng/dL    Comment: (NOTE) Biotin ingestion may interfere with free T4 tests. If the results are inconsistent with the TSH level, previous test results, or the clinical presentation, then consider biotin interference. If needed, order repeat testing after stopping biotin. Performed at [facility], [address] [place], [place] [address]   CBC with Differential     Status: Abnormal   Collection Time: 06/05/19  1:30 PM  Result Value Ref Range   WBC 8.5 4.0 - 10.5 K/uL   RBC 4.08 3.87 - 5.11 MIL/uL   Hemoglobin 12.8 12.0 - 15.0 g/dL   HCT 36.6 36.0 - 46.0 %   MCV 89.7 80.0 - 100.0 fL   MCH 31.4 26.0 - 34.0 pg   MCHC 35.0 30.0 - 36.0 g/dL   RDW 12.2 11.5 - 15.5 %   Platelets 239 150 - 400 K/uL   nRBC 0.0 0.0 - 0.2 %   Neutrophils Relative % 57 %   Neutro Abs 4.9 1.7 - 7.7 K/uL   Lymphocytes Relative 24 %   Lymphs Abs 2.0 0.7 - 4.0 K/uL   Monocytes Relative 18 %   Monocytes Absolute 1.5 (H) 0.1 - 1.0 K/uL   Eosinophils Relative 0 %   Eosinophils Absolute 0.0 0.0 - 0.5 K/uL   Basophils Relative 0 %   Basophils Absolute 0.0 0.0 - 0.1 K/uL   Immature Granulocytes 1 %   Abs Immature Granulocytes 0.05 0.00 - 0.07 K/uL    Comment: Performed at [facility], [address] [place], [place] [address]  Comprehensive metabolic panel     Status: Abnormal   Collection Time: 06/05/19  1:30 PM  Result Value Ref Range   Sodium 123 (L) 135 - 145 mmol/L   Potassium 2.7 (LL) 3.5 -  5.1 mmol/L    Comment: CRITICAL RESULT CALLED TO, READ BACK BY AND VERIFIED WITH BRANDY DAVIS ON 06/05/19 AT 1418 BY JAG    Chloride 84 (L) 98 - 111 mmol/L   CO2 25 22 - 32 mmol/L   Glucose, Bld 107 (H) 70 - 99 mg/dL   BUN <5 (L) 6 - 20 mg/dL   Creatinine, Ser 0.55 0.44 - 1.00 mg/dL   Calcium 8.8 (L) 8.9 -  10.3 mg/dL   Total Protein 8.2 (H) 6.5 - 8.1 g/dL   Albumin 3.9 3.5 - 5.0 g/dL   AST 19 15 - 41 U/L   ALT 10 0 - 44 U/L   Alkaline Phosphatase 71 38 - 126 U/L   Total Bilirubin 0.5 0.3 - 1.2 mg/dL   GFR calc non Af Amer >60 >60 mL/min   GFR calc Af Amer >60 >60 mL/min   Anion gap 14 5 - 15    Comment: Performed at [facility], [address] [place], [place] [address]  Acetaminophen level     Status: Abnormal   Collection Time: 06/05/19  1:30 PM  Result Value Ref Range   Acetaminophen (Tylenol), Serum <10 (L) 10 - 30 ug/mL    Comment: (NOTE) Therapeutic concentrations vary significantly. A range of 10-30 ug/mL  may be an effective concentration for many patients. However, some  are best treated at concentrations outside of this range. Acetaminophen concentrations >150 ug/mL at 4 hours after ingestion  and >50 ug/mL at 12 hours after ingestion are often associated with  toxic reactions. Performed at [facility], [address] [place], [place] [id number]   Salicylate level     Status: Abnormal   Collection Time: 06/05/19  1:30 PM  Result Value Ref Range   Salicylate Lvl [id number] (L) 7.0 - 30.0 mg/dL    Comment: Performed at [facility], [address] [place], [place] [address]  Ethanol     Status: None   Collection Time: 06/05/19  1:30 PM  Result Value Ref Range   Alcohol, Ethyl (B) <10 <10 mg/dL    Comment: (NOTE) Lowest detectable limit for serum alcohol is 10 mg/dL. For medical purposes only. Performed at [facility], [address] [place],  [address]   Magnesium     Status: None   Collection Time: 06/05/19   1:30 PM  Result Value Ref Range   Magnesium 1.8 1.7 - 2.4 mg/dL    Comment: Performed at [facility], [address] [place],  [address]    Current Facility-Administered Medications  Medication Dose Route Frequency Provider Last Rate Last Admin  . potassium chloride 10 mEq in 100 mL IVPB  10 mEq Intravenous Q1 Hr x 2 Vanessa [place], MD      . potassium chloride SA (KLOR-CON) CR tablet 40 mEq  40 mEq Oral Once Marjean Donna E, MD      . sodium chloride 0.9 % bolus 1,000 mL  1,000 mL Intravenous Once Vanessa [place], MD       Current Outpatient Medications  Medication Sig Dispense Refill  . BANOPHEN 25 MG capsule Take 25 mg by mouth at bedtime.    . benztropine (COGENTIN) 1 MG tablet Take 1 tablet (1 mg total) by mouth 2 (two) times daily. 60 tablet 3  . cetirizine (ZYRTEC) 10 MG tablet Take 1 tablet (10 mg total) by mouth daily as needed for allergies. (Patient taking differently: Take 10 mg by mouth daily. )    . divalproex (DEPAKOTE ER) 500 MG 24 hr tablet Take 1 tablet (500 mg total) by mouth 2 (two) times daily. 60 tablet 3  . haloperidol (HALDOL) 5 MG tablet 1 in am 2 at hs 90 tablet 3  . haloperidol decanoate (HALDOL DECANOATE) 100 MG/ML injection Inject 100 mg into the muscle every 28 (twenty-eight) days.    Marland Kitchen levothyroxine (SYNTHROID) 88 MCG tablet Take 1 tablet (88 mcg total) by mouth  daily before breakfast. 90 tablet 2  . LORazepam (ATIVAN) 1 MG tablet Take 1 tablet (1 mg total) by mouth 2 (two) times daily as needed for anxiety (agitation). 30 tablet 0    Musculoskeletal: Strength & Muscle Tone: within normal limits Gait & Station: normal Patient leans: N/A  Psychiatric Specialty Exam: Physical Exam  Nursing note and vitals reviewed. Constitutional: She is oriented to person, place, and time. She appears well-developed and well-nourished.  HENT:  Head: Normocephalic.  Respiratory: Effort normal.  Musculoskeletal:        General: Normal range of motion.      Cervical back: Normal range of motion.  Neurological: She is alert and oriented to person, place, and time.  Psychiatric: Her speech is normal. Thought content normal. Her mood appears anxious. She is hyperactive. Cognition and memory are impaired. She expresses impulsivity and inappropriate judgment.    Review of Systems  Psychiatric/Behavioral: Positive for agitation. The patient is nervous/anxious.   All other systems reviewed and are negative.   Blood pressure (!) 148/82, pulse (!) 106, temperature 98.6 F (37 C), temperature source Oral, resp. rate (!) 21, height 5\' 4"  (1.626 m), weight 61.2 kg, SpO2 100 %.Body mass index is 23.17 kg/m.  General Appearance: Casual  Eye Contact:  Good  Speech:  Normal Rate  Volume:  Normal  Mood:  Anxious  Affect:  Blunt  Thought Process:  Coherent and Descriptions of Associations: Intact  Orientation:  Full (Time, Place, and Person)  Thought Content:  Paranoid Ideation  Suicidal Thoughts:  No  Homicidal Thoughts:  No  Memory:  Immediate;   Fair Recent;   Fair Remote;   Fair  Judgement:  Impaired  Insight:  Lacking  Psychomotor Activity:  Decreased now  Concentration:  Concentration: Fair and Attention Span: Fair  Recall:  [employer] of Knowledge:  Fair  Language:  Fair  Akathisia:  No  Handed:  Right  AIMS (if indicated):     Assets:  Housing Leisure Time Physical Health Resilience Social Support  ADL's:  Intact,needs prompting for showers and ADLS  Cognition:  Impaired,  Mild  Sleep:      49 year old female admitted for increasing aggression and agitation since she ran out of a couple of her medications 2 weeks ago, long history of schizophrenia paranoid type.  Lives with her mother who was called for collateral as the patient does have IDD.  Reports she was removed from the oral Haldol most likely because of the long-term injectable Haldol dec that she receives, unsure of the medication she ran out of.  She was stable on the oral  Haldol and Haldol deck along with her other medications prior to this to past 2 weeks.  Treatment Plan Summary: Daily contact with patient to assess and evaluate symptoms and progress in treatment, Medication management and Plan Schizophrenia, paranoid type:  -Restarted Haldol 5 mg BID -Restarted Depakote 500 mg BID  Hypothyroidism: -Restarted synthroid 46mcg  EPS: -Restarted Cogentin 1 mg BID  Agitation: -Restarted ATivan 1 mg BID PRN  Disposition: Supportive therapy provided about ongoing stressors.  Patient needs stabilization medically and psychiatrically prior to discharge consideration.  Not there at this time.  Waylan Boga, NP 06/05/2019 3:20 PM   Case discussed and plan agreed upon as outlined above.

## 2019-06-05 NOTE — ED Notes (Signed)
Pt dressed out with this RN and Lovena Le, NT. Pt has one yellow jacket, one yellow pair of pants, two gold colored shoes, two socks, one pair underwear.

## 2019-06-06 ENCOUNTER — Inpatient Hospital Stay
Admission: RE | Admit: 2019-06-06 | Discharge: 2019-06-08 | DRG: 885 | Disposition: A | Discharge status: Home / Self Care | Payer: Medicaid Other | Source: Intra-hospital | Attending: Psychiatry | Admitting: Psychiatry

## 2019-06-06 ENCOUNTER — Other Ambulatory Visit: Payer: Self-pay

## 2019-06-06 ENCOUNTER — Encounter: Payer: Self-pay | Admitting: Psychiatry

## 2019-06-06 DIAGNOSIS — J301 Allergic rhinitis due to pollen: Secondary | ICD-10-CM | POA: Diagnosis present

## 2019-06-06 DIAGNOSIS — F70 Mild intellectual disabilities: Secondary | ICD-10-CM | POA: Diagnosis present

## 2019-06-06 DIAGNOSIS — K219 Gastro-esophageal reflux disease without esophagitis: Secondary | ICD-10-CM | POA: Diagnosis present

## 2019-06-06 DIAGNOSIS — F209 Schizophrenia, unspecified: Secondary | ICD-10-CM | POA: Diagnosis present

## 2019-06-06 DIAGNOSIS — E031 Congenital hypothyroidism without goiter: Secondary | ICD-10-CM | POA: Diagnosis present

## 2019-06-06 DIAGNOSIS — Z79890 Hormone replacement therapy (postmenopausal): Secondary | ICD-10-CM

## 2019-06-06 DIAGNOSIS — F203 Undifferentiated schizophrenia: Secondary | ICD-10-CM | POA: Diagnosis not present

## 2019-06-06 DIAGNOSIS — Z79899 Other long term (current) drug therapy: Secondary | ICD-10-CM | POA: Diagnosis not present

## 2019-06-06 DIAGNOSIS — G4700 Insomnia, unspecified: Secondary | ICD-10-CM | POA: Diagnosis present

## 2019-06-06 DIAGNOSIS — F419 Anxiety disorder, unspecified: Secondary | ICD-10-CM | POA: Diagnosis present

## 2019-06-06 LAB — RESPIRATORY PANEL BY RT PCR (FLU A&B, COVID)
Influenza A by PCR: NEGATIVE
Influenza B by PCR: NEGATIVE
SARS Coronavirus 2 by RT PCR: NEGATIVE

## 2019-06-06 MED ORDER — ACETAMINOPHEN 325 MG PO TABS: 650.0000 mg | ORAL_TABLET | Freq: Four times a day (QID) | ORAL | Status: DC | PRN

## 2019-06-06 MED ORDER — ALUM & MAG HYDROXIDE-SIMETH 200-200-20 MG/5ML PO SUSP: 30.0000 mL | ORAL | Status: DC | PRN

## 2019-06-06 MED ORDER — MAGNESIUM HYDROXIDE 400 MG/5ML PO SUSP: 30.0000 mL | Freq: Every day | ORAL | Status: DC | PRN

## 2019-06-06 NOTE — Tx Team (Signed)
Initial Treatment Plan 06/06/2019 5:52 PM Theodosia Paling [id number]    PATIENT STRESSORS: Marital or family conflict Medication change or noncompliance   PATIENT STRENGTHS: Physical Health Supportive family/friends   PATIENT IDENTIFIED PROBLEMS: Psychosis (per family)  Agitation (per family)                   DISCHARGE CRITERIA:  Improved stabilization in mood, thinking, and/or behavior Need for constant or close observation no longer present Reduction of life-threatening or endangering symptoms to within safe limits  PRELIMINARY DISCHARGE PLAN: Outpatient therapy Return to previous living arrangement  PATIENT/FAMILY INVOLVEMENT: This treatment plan has been presented to and reviewed with the patient, Tamara [name]. The patient has been given the opportunity to ask questions and make suggestions.  Stokely Jeancharles, RN 06/06/2019, 5:52 PM

## 2019-06-06 NOTE — ED Notes (Signed)
Hourly rounding reveals patient in room. No complaints, stable, in no acute distress. Q15 minute rounds and monitoring via Rover and Officer to continue.   

## 2019-06-06 NOTE — Plan of Care (Signed)
Has been in the milieu, pleasant but intrusive  at times. Patient is often in the dayroom with peers. Thought process is impaired: patient unable to provide meaningful information during assessment. Patient stayed in the dayroom and had a snack. Safety monitored as expected.

## 2019-06-06 NOTE — ED Notes (Signed)
Patient ambulatory to shower. Given new scrubs and underwear.

## 2019-06-06 NOTE — Plan of Care (Signed)
New admission.   Problem: Education: Goal: Knowledge of [place] [facility] Education information/materials will improve Outcome: Not Progressing Goal: Emotional status will improve Outcome: Not Progressing Goal: Mental status will improve Outcome: Not Progressing Goal: Verbalization of understanding the information provided will improve Outcome: Not Progressing   Problem: Safety: Goal: Periods of time without injury will increase Outcome: Not Progressing   Problem: Education: Goal: Mental status will improve Outcome: Not Progressing   Problem: Activity: Goal: Interest or engagement in activities will improve Outcome: Not Progressing

## 2019-06-06 NOTE — Progress Notes (Signed)
Admission Note:   Report was received from Mickel Baas, [place] on a 49 year old female who presents IVC in no acute distress for the treatment of Psychosis, per IVC paperwork. Per report, patient's family stated that patient has been med compliant, however, the medication doesn't seem to be working. Patient came in the ED paranoid and per report, patient's family reported increasing hallucinations and agitation at home. Patient was calm and cooperative with admission process. Patient has intellectual disability, so this Probation officer wasn't able to get much from her, majority of admission was completed through chart review. Patient denied SI/HI/AVH and pain at this time. Patient also denied depression/anxiety. Patient has past medical history of GERD and Schizophrenia. Skin was assessed with Gigi, RN and found to be clear of any abnormal marks apart from a scar/scab on her lower, left chin. Patient searched and no contraband found and unit policies explained and understanding verbalized. Consents obtained. Food and fluids offered, and fluids accepted. Patient had no additional questions or concerns at this time.

## 2019-06-06 NOTE — BH Assessment (Signed)
Patient is to be admitted to [facility] by Dr. Claris Gower.  Attending Physician will be Dr. Mallie Darting.   Patient has been assigned to room 312, by Bude.   Intake Paper Work has been signed and placed on patient chart.   ER staff is aware of the admission:  Lattie Haw, ER Secretary    Dr. Joni Fears, ER MD   Mickel Baas, [name] Nurse   Elberta Fortis, Patient Access.

## 2019-06-07 DIAGNOSIS — F203 Undifferentiated schizophrenia: Secondary | ICD-10-CM

## 2019-06-07 LAB — LIPID PANEL
Cholesterol: 179 mg/dL (ref 0–200)
HDL: 63 mg/dL (ref >40)
LDL Cholesterol: 98 mg/dL (ref 0–99)
Total CHOL/HDL Ratio: 2.8 RATIO
Triglycerides: 90 mg/dL (ref <150)
VLDL: 18 mg/dL (ref 0–40)

## 2019-06-07 LAB — COMPREHENSIVE METABOLIC PANEL
ALT: 8 U/L (ref 0–44)
AST: 15 U/L (ref 15–41)
Albumin: 3.5 g/dL (ref 3.5–5.0)
Alkaline Phosphatase: 66 U/L (ref 38–126)
Anion gap: 11 (ref 5–15)
BUN: 8 mg/dL (ref 6–20)
CO2: 26 mmol/L (ref 22–32)
Calcium: 10.1 mg/dL (ref 8.9–10.3)
Chloride: 98 mmol/L (ref 98–111)
Creatinine, Ser: 0.76 mg/dL (ref 0.44–1.00)
GFR calc Af Amer: >60 mL/min (ref >60)
GFR calc non Af Amer: >60 mL/min (ref >60)
Glucose, Bld: 97 mg/dL (ref 70–99)
Potassium: 4.4 mmol/L (ref 3.5–5.1)
Sodium: 135 mmol/L (ref 135–145)
Total Bilirubin: 0.4 mg/dL (ref 0.3–1.2)
Total Protein: 7.6 g/dL (ref 6.5–8.1)

## 2019-06-07 LAB — CBC
HCT: 35.8 % — ABNORMAL LOW (ref 36.0–46.0)
Hemoglobin: 12.1 g/dL (ref 12.0–15.0)
MCH: 31.4 pg (ref 26.0–34.0)
MCHC: 33.8 g/dL (ref 30.0–36.0)
MCV: 93 fL (ref 80.0–100.0)
Platelets: 170 10*3/uL (ref 150–400)
RBC: 3.85 MIL/uL — ABNORMAL LOW (ref 3.87–5.11)
RDW: 12.9 % (ref 11.5–15.5)
WBC: 7.1 10*3/uL (ref 4.0–10.5)
nRBC: 0 % (ref 0.0–0.2)

## 2019-06-07 LAB — TSH: TSH: 1.611 u[IU]/mL (ref 0.350–4.500)

## 2019-06-07 MED ORDER — LEVOTHYROXINE SODIUM 88 MCG PO TABS
88.0000 ug | ORAL_TABLET | Freq: Every day | ORAL | Status: DC
  Administered 2019-06-08: 88 ug via ORAL
  Filled 2019-06-07: qty 1

## 2019-06-07 MED ORDER — HALOPERIDOL 5 MG PO TABS
5.0000 mg | ORAL_TABLET | Freq: Three times a day (TID) | ORAL | Status: DC
  Administered 2019-06-07 – 2019-06-08 (×4): 5 mg via ORAL
  Filled 2019-06-07 (×4): qty 1

## 2019-06-07 MED ORDER — DIVALPROEX SODIUM 500 MG PO DR TAB
500.0000 mg | DELAYED_RELEASE_TABLET | Freq: Two times a day (BID) | ORAL | Status: DC
  Administered 2019-06-07 – 2019-06-08 (×3): 500 mg via ORAL
  Filled 2019-06-07 (×3): qty 1

## 2019-06-07 MED ORDER — LORATADINE 10 MG PO TABS
10.0000 mg | ORAL_TABLET | Freq: Every day | ORAL | Status: DC
  Administered 2019-06-07 – 2019-06-08 (×2): 10 mg via ORAL
  Filled 2019-06-07 (×2): qty 1

## 2019-06-07 MED ORDER — BENZTROPINE MESYLATE 1 MG PO TABS
0.5000 mg | ORAL_TABLET | Freq: Two times a day (BID) | ORAL | Status: DC
  Administered 2019-06-07 – 2019-06-08 (×2): 0.5 mg via ORAL
  Filled 2019-06-07 (×2): qty 1

## 2019-06-07 MED ORDER — LORAZEPAM 1 MG PO TABS
1.0000 mg | ORAL_TABLET | Freq: Four times a day (QID) | ORAL | Status: DC | PRN
  Administered 2019-06-07: 1 mg via ORAL
  Filled 2019-06-07: qty 1

## 2019-06-07 NOTE — Progress Notes (Signed)
Patient slept throughout the night. Woke up on time for vital signs then went back to her room. Currently in bed resting. Safety monitored as expected.

## 2019-06-07 NOTE — Plan of Care (Signed)
D- Patient alert and oriented. Patient presents in a pleasant mood on assessment stating that she slept "good" last night and had no complaints to voice to this Probation officer. Patient is limited in what she says to this writer, however, she will answer questions with "yes" or "no". Patient will also answer simple questions for this writer. Patient denies SI, HI, AVH, and pain at this time. Patient also denies any signs/symptoms of depression/anxiety, stating "never" to this Probation officer. Patient had no stated goals for today.   A- Scheduled medications administered to patient, per MD orders. Support and encouragement provided.  Routine safety checks conducted every 15 minutes.  Patient informed to notify staff with problems or concerns.  R- No adverse drug reactions noted. Patient contracts for safety at this time. Patient compliant with medications and treatment plan. Patient receptive, calm, and cooperative. Patient interacts well with others on the unit.  Patient remains safe at this time.  Problem: Education: Goal: Knowledge of [place] [facility] Education information/materials will improve Outcome: Progressing Goal: Emotional status will improve Outcome: Progressing Goal: Mental status will improve Outcome: Progressing Goal: Verbalization of understanding the information provided will improve Outcome: Progressing   Problem: Safety: Goal: Periods of time without injury will increase Outcome: Progressing   Problem: Education: Goal: Mental status will improve Outcome: Progressing   Problem: Activity: Goal: Interest or engagement in activities will improve Outcome: Progressing

## 2019-06-07 NOTE — BHH Suicide Risk Assessment (Signed)
[place] INPATIENT:  Family/Significant Other Suicide Prevention Education  Suicide Prevention Education:  Education Completed; Tamara [name], mother and legal guardian [phone] has been identified by the patient as the family member/significant other with whom the patient will be residing, and identified as the person(s) who will aid the patient in the event of a mental health crisis (suicidal ideations/suicide attempt).  With written consent from the patient, the family member/significant other has been provided the following suicide prevention education, prior to the and/or following the discharge of the patient.  The suicide prevention education provided includes the following:  Suicide risk factors  Suicide prevention and interventions  National Suicide Hotline telephone number  [facility] assessment telephone number  [facility] Emergency Assistance [place] and/or Residential Mobile Crisis Unit telephone number  Request made of family/significant other to:  Remove weapons (e.g., guns, rifles, knives), all items previously/currently identified as safety concern.    Remove drugs/medications (over-the-counter, prescriptions, illicit drugs), all items previously/currently identified as a safety concern.  The family member/significant other verbalizes understanding of the suicide prevention education information provided.  The family member/significant other agrees to remove the items of safety concern listed above.  CSW spoke with pts mother and legal guardian who reported pt is in the hospital because "she went out her mind". Per pts mother, she has no SI/HI concerns on concerns with pt returning home at discharge. Tamara [name] denied there being weapons in the home.   [place] MSW LCSW 06/07/2019, 10:10 AM

## 2019-06-07 NOTE — [facility] Suicide Risk Assessment (Signed)
[facility] Admission Suicide Risk Assessment   Nursing information obtained from:  Patient Demographic factors:  NA Current Mental Status:  NA Loss Factors:  NA Historical Factors:  NA Risk Reduction Factors:  Living with another person, especially a relative  Total Time spent with patient: 1 hour Principal Problem: Schizophrenia ([place]) Diagnosis:  Principal Problem:   Schizophrenia ([place]) Active Problems:   Mild intellectual disability   Congenital hypothyroidism without goiter  Subjective Data: Patient seen and chart reviewed.  49 year old woman with a history of schizophrenia and mild intellectual disability presented to the emergency room agitated.  Currently the patient is calm pleasant cooperative denies any suicidal or homicidal ideation and is agreeable to medication changes.  Continued Clinical Symptoms:  Alcohol Use Disorder Identification Test Final Score (AUDIT): 0 The "Alcohol Use Disorders Identification Test", Guidelines for Use in Primary Care, Second Edition.  World Pharmacologist [facility]). Score between 0-7:  no or low risk or alcohol related problems. Score between 8-15:  moderate risk of alcohol related problems. Score between 16-19:  high risk of alcohol related problems. Score 20 or above:  warrants further diagnostic evaluation for alcohol dependence and treatment.   CLINICAL FACTORS:   Schizophrenia:   Paranoid or undifferentiated type   Musculoskeletal: Strength & Muscle Tone: within normal limits Gait & Station: normal Patient leans: N/A  Psychiatric Specialty Exam: Physical Exam  Nursing note and vitals reviewed. Constitutional: She appears well-developed and well-nourished.  HENT:  Head: Normocephalic and atraumatic.  Eyes: Pupils are equal, round, and reactive to light. Conjunctivae are normal.  Cardiovascular: Regular rhythm and normal heart sounds.  Respiratory: Effort normal. No respiratory distress.  GI: Soft.  Musculoskeletal:      General: Normal range of motion.     Cervical back: Normal range of motion.  Neurological: She is alert.  Skin: Skin is warm and dry.  Psychiatric: She has a normal mood and affect. Her speech is normal and behavior is normal. Judgment and thought content normal. Cognition and memory are impaired.    Review of Systems  Constitutional: Negative.   HENT: Negative.   Eyes: Negative.   Respiratory: Negative.   Cardiovascular: Negative.   Gastrointestinal: Negative.   Musculoskeletal: Negative.   Skin: Negative.   Neurological: Negative.   Psychiatric/Behavioral: Negative.     Blood pressure 105/61, pulse 83, temperature 98 F (36.7 C), temperature source Oral, resp. rate 18, height 5\' 1"  (1.549 m), weight 61.7 kg, SpO2 99 %.Body mass index is 25.7 kg/m.  General Appearance: Casual  Eye Contact:  Good  Speech:  Clear and Coherent  Volume:  Normal  Mood:  Euthymic  Affect:  Congruent  Thought Process:  Coherent  Orientation:  Full (Time, Place, and Person)  Thought Content:  Rumination and Tangential  Suicidal Thoughts:  No  Homicidal Thoughts:  No  Memory:  Immediate;   Fair Recent;   Fair Remote;   Fair  Judgement:  Fair  Insight:  Fair  Psychomotor Activity:  Normal  Concentration:  Concentration: Fair  Recall:  [employer] of Knowledge:  Fair  Language:  Fair  Akathisia:  No  Handed:  Right  AIMS (if indicated):     Assets:  Desire for Improvement Financial Resources/Insurance Housing Resilience Social Support  ADL's:  Intact  Cognition:  Impaired,  Mild  Sleep:  Number of Hours: 8      COGNITIVE FEATURES THAT CONTRIBUTE TO RISK:  None    SUICIDE RISK:   Minimal: No identifiable suicidal ideation.  Patients presenting with no risk factors but with morbid ruminations; may be classified as minimal risk based on the severity of the depressive symptoms  PLAN OF CARE: Patient admitted because of agitation and psychosis but not suicidal behavior.  Patient will  continue on 15-minute checks.  Medication adjustments have been completed.  Assess overnight.  Treatment team has seen her.  We will make sure she has appropriate follow-up at discharge.  I certify that inpatient services furnished can reasonably be expected to improve the patient's condition.   Alethia Berthold, MD 06/07/2019, 12:49 PM

## 2019-06-07 NOTE — Progress Notes (Signed)
Recreation Therapy Notes  Date: 06/07/2019  Time: 9:30 am  Location: Craft room  Behavioral response: Appropriate  Intervention Topic: Self-care   Discussion/Intervention:  Group content today was focused on Self-Care. The group defined self-care and some positive ways they care for themselves. Individuals expressed ways and reasons why they neglected any self-care in the past. Patients described ways to improve self-care in the future. The group explained what could happen if they did not do any self-care activities at all. The group participated in the intervention "self-care assessment" where they had a chance to discover some of their weaknesses and strengths in self- care. Patient came up with a self-care plan to improve themselves in the future.  Clinical Observations/Feedback:  Patient came to group and stated that she participates in self care by cleaning her self up. She stated that she cleans under her arms and her feet when doing self care. Individual participated in the intervention during group and was social with peers and staff. Chantil Bari LRT/CTRS         Kwana Ringel 06/07/2019 11:24 AM

## 2019-06-07 NOTE — H&P (Signed)
Psychiatric Admission Assessment Adult  Patient Identification: Tamara [name] MRN:  [id number] Date of Evaluation:  06/07/2019 Chief Complaint:  Schizophrenia ([place]) [F20.9] Principal Diagnosis: Schizophrenia ([place]) Diagnosis:  Principal Problem:   Schizophrenia ([place]) Active Problems:   Mild intellectual disability   Congenital hypothyroidism without goiter  History of Present Illness: Patient seen and chart reviewed.  49 year old woman known to our service who has a history of schizophrenia and developmental disability.  She was brought to the emergency room by her sister 2 days ago with reports that the patient had decompensated and become agitated and hostile.  Patient was documented as being agitated and requiring forced medicine on first presentation to the emergency room.  By the time she was seen by the psychiatrist things had already started to settle down.  On interview today the patient remembers having been agitated.  She says that she takes all of her medicine as prescribed but collateral information suggests that recently she was taken off of her Depakote and oral Haldol.  Patient is not the best historian because of her developmental disability.  She denies sleep problems denies appetite problems denies any medical issues.  She talks about how her mother was in a car accident and is having a wound that it is having trouble healing and that is the biggest stress in her life.  Completely denies any suicidal or homicidal thought.  Does not appear to be responding to internal stimuli.  Denies any hallucinations.  So far cooperative with medicine. Associated Signs/Symptoms: Depression Symptoms:  insomnia, (Hypo) Manic Symptoms:  Impulsivity, Anxiety Symptoms:  None reported Psychotic Symptoms:  Patient has a history of hallucinations and paranoia but currently is denying any PTSD Symptoms: Negative Total Time spent with patient: 1 hour  Past Psychiatric History: Patient has a long  history of schizophrenia and developmental disability.  At her baseline she is described as being "sweet" by her family and in fact that is what we usually see when she is on her medicine.  She has been on combination of antipsychotic and mood stabilizing medicine pretty consistently for years.  Most recently had been on Depakote as well as Haldol decanoate with additional oral Haldol and Cogentin during the day.  Apparently recently the oral medicines may have been stopped.  Patient has no known history of suicidality.  When she is off medicine she can become manic and agitated although I do not know that she is ever really hurt anybody.  Is the patient at risk to self? Yes.    Has the patient been a risk to self in the past 6 months? No.  Has the patient been a risk to self within the distant past? Yes.    Is the patient a risk to others? No.  Has the patient been a risk to others in the past 6 months? No.  Has the patient been a risk to others within the distant past? No.   Prior Inpatient Therapy:   Prior Outpatient Therapy:    Alcohol Screening: 1. How often do you have a drink containing alcohol?: Never 2. How many drinks containing alcohol do you have on a typical day when you are drinking?: 1 or 2 3. How often do you have six or more drinks on one occasion?: Never AUDIT-C Score: 0 4. How often during the last year have you found that you were not able to stop drinking once you had started?: Never 5. How often during the last year have you failed to do  what was normally expected from you becasue of drinking?: Never 6. How often during the last year have you needed a first drink in the morning to get yourself going after a heavy drinking session?: Never 7. How often during the last year have you had a feeling of guilt of remorse after drinking?: Never 8. How often during the last year have you been unable to remember what happened the night before because you had been drinking?: Never 9.  Have you or someone else been injured as a result of your drinking?: No 10. Has a relative or friend or a doctor or another health worker been concerned about your drinking or suggested you cut down?: No Alcohol Use Disorder Identification Test Final Score (AUDIT): 0 Alcohol Brief Interventions/Follow-up: AUDIT Score <7 follow-up not indicated Substance Abuse History in the last 12 months:  No. Consequences of Substance Abuse: Negative Previous Psychotropic Medications: Yes  Psychological Evaluations: Yes  Past Medical History:  Past Medical History:  Diagnosis Date  . GERD (gastroesophageal reflux disease)   . IUD (intrauterine device) in place IUD was placed in 2016  . Leiomyoma of uterus   . Schizophrenia ([place])   . Thyroid disease    History reviewed. No pertinent surgical history. Family History: History reviewed. No pertinent family history. Family Psychiatric  History: No known family history according to the patient Tobacco Screening: Have you used any form of tobacco in the last 30 days? (Cigarettes, Smokeless Tobacco, Cigars, and/or Pipes): No Social History:  Social History   Substance and Sexual Activity  Alcohol Use No     Social History   Substance and Sexual Activity  Drug Use No    Additional Social History: Marital status: Single Are you sexually active?: No Does patient have children?: No                         Allergies:  No Known Allergies Lab Results:  Results for orders placed or performed during the hospital encounter of 06/06/19 (from the past 48 hour(s))  CBC     Status: Abnormal   Collection Time: 06/07/19 10:56 AM  Result Value Ref Range   WBC 7.1 4.0 - 10.5 K/uL   RBC 3.85 (L) 3.87 - 5.11 MIL/uL   Hemoglobin 12.1 12.0 - 15.0 g/dL   HCT 35.8 (L) 36.0 - 46.0 %   MCV 93.0 80.0 - 100.0 fL   MCH 31.4 26.0 - 34.0 pg   MCHC 33.8 30.0 - 36.0 g/dL   RDW 12.9 11.5 - 15.5 %   Platelets 170 150 - 400 K/uL   nRBC 0.0 0.0 - 0.2 %     Comment: Performed at [facility], [address] [place], [place] [address]  Comprehensive metabolic panel     Status: None   Collection Time: 06/07/19 11:45 AM  Result Value Ref Range   Sodium 135 135 - 145 mmol/L   Potassium 4.4 3.5 - 5.1 mmol/L   Chloride 98 98 - 111 mmol/L   CO2 26 22 - 32 mmol/L   Glucose, Bld 97 70 - 99 mg/dL   BUN 8 6 - 20 mg/dL   Creatinine, Ser 0.76 0.44 - 1.00 mg/dL   Calcium 10.1 8.9 - 10.3 mg/dL   Total Protein 7.6 6.5 - 8.1 g/dL   Albumin 3.5 3.5 - 5.0 g/dL   AST 15 15 - 41 U/L   ALT 8 0 - 44 U/L   Alkaline Phosphatase 66 38 -  126 U/L   Total Bilirubin 0.4 0.3 - 1.2 mg/dL   GFR calc non Af Amer >60 >60 mL/min   GFR calc Af Amer >60 >60 mL/min   Anion gap 11 5 - 15    Comment: Performed at [facility], [address] [place], [place] [address]  Lipid panel     Status: None   Collection Time: 06/07/19 11:45 AM  Result Value Ref Range   Cholesterol 179 0 - 200 mg/dL   Triglycerides 90 <150 mg/dL   HDL 63 >40 mg/dL   Total CHOL/HDL Ratio 2.8 RATIO   VLDL 18 0 - 40 mg/dL   LDL Cholesterol 98 0 - 99 mg/dL    Comment:        Total Cholesterol/HDL:CHD Risk Coronary Heart Disease Risk Table                     Men   Women  1/2 Average Risk   3.4   3.3  Average Risk       5.0   4.4  2 X Average Risk   9.6   7.1  3 X Average Risk  23.4   11.0        Use the calculated Patient Ratio above and the CHD Risk Table to determine the patient's CHD Risk.        ATP III CLASSIFICATION (LDL):  <100     mg/dL   Optimal  100-129  mg/dL   Near or Above                    Optimal  130-159  mg/dL   Borderline  160-189  mg/dL   High  >190     mg/dL   Very High Performed at [facility], [address] [place], [place] [address]   TSH     Status: None   Collection Time: 06/07/19 11:45 AM  Result Value Ref Range   TSH 1.611 0.350 - 4.500 uIU/mL    Comment: Performed by a 3rd Generation assay with a functional sensitivity of  <=0.01 uIU/mL. Performed at [facility], [address] [place], [place] [address]     Blood Alcohol level:  Lab Results  Component Value Date   [facility] <10 06/05/2019   ETH <10 [id number]    Metabolic Disorder Labs:  Lab Results  Component Value Date   HGBA1C 5.5 09/11/2015   Lab Results  Component Value Date   PROLACTIN 163.4 (H) 09/11/2015   Lab Results  Component Value Date   CHOL 179 06/07/2019   TRIG 90 06/07/2019   HDL 63 06/07/2019   CHOLHDL 2.8 06/07/2019   VLDL 18 06/07/2019   LDLCALC 98 06/07/2019   LDLCALC 127 (H) 09/11/2015    Current Medications: Current Facility-Administered Medications  Medication Dose Route Frequency Provider Last Rate Last Admin  . acetaminophen (TYLENOL) tablet 650 mg  650 mg Oral Q6H PRN Cristofano, Dorene Ar, MD      . alum & mag hydroxide-simeth (MAALOX/MYLANTA) 200-200-20 MG/5ML suspension 30 mL  30 mL Oral Q4H PRN Cristofano, Paul A, MD      . benztropine (COGENTIN) tablet 0.5 mg  0.5 mg Oral BID Gelsey Amyx T, MD      . divalproex (DEPAKOTE) DR tablet 500 mg  500 mg Oral Q12H Denys Labree T, MD   500 mg at 06/07/19 1146  . haloperidol (HALDOL) tablet 5 mg  5 mg Oral TID Moraima Burd, Madie Reno,  MD   5 mg at 06/07/19 1146  . [START ON 06/08/2019] levothyroxine (SYNTHROID) tablet 88 mcg  88 mcg Oral Q0600 Hurshell Dino T, MD      . loratadine (CLARITIN) tablet 10 mg  10 mg Oral Daily Lekha Dancer, Madie Reno, MD   10 mg at 06/07/19 1146  . LORazepam (ATIVAN) tablet 1 mg  1 mg Oral Q6H PRN Toshie Demelo T, MD      . magnesium hydroxide (MILK OF MAGNESIA) suspension 30 mL  30 mL Oral Daily PRN Cristofano, Dorene Ar, MD       PTA Medications: Medications Prior to Admission  Medication Sig Dispense Refill Last Dose  . BANOPHEN 25 MG capsule Take 25 mg by mouth at bedtime.     . benztropine (COGENTIN) 1 MG tablet Take 1 tablet (1 mg total) by mouth 2 (two) times daily. 60 tablet 3   . cetirizine (ZYRTEC) 10 MG tablet Take 1 tablet (10 mg total)  by mouth daily as needed for allergies. (Patient taking differently: Take 10 mg by mouth daily. )     . divalproex (DEPAKOTE ER) 500 MG 24 hr tablet Take 1 tablet (500 mg total) by mouth 2 (two) times daily. 60 tablet 3   . haloperidol (HALDOL) 5 MG tablet 1 in am 2 at hs 90 tablet 3   . haloperidol decanoate (HALDOL DECANOATE) 100 MG/ML injection Inject 100 mg into the muscle every 28 (twenty-eight) days.     Marland Kitchen levothyroxine (SYNTHROID) 88 MCG tablet Take 1 tablet (88 mcg total) by mouth daily before breakfast. 90 tablet 2   . LORazepam (ATIVAN) 1 MG tablet Take 1 tablet (1 mg total) by mouth 2 (two) times daily as needed for anxiety (agitation). 30 tablet 0     Musculoskeletal: Strength & Muscle Tone: within normal limits Gait & Station: normal Patient leans: N/A  Psychiatric Specialty Exam: Physical Exam  Nursing note and vitals reviewed. Constitutional: She appears well-developed and well-nourished.  HENT:  Head: Normocephalic and atraumatic.  Eyes: Pupils are equal, round, and reactive to light. Conjunctivae are normal.  Cardiovascular: Regular rhythm and normal heart sounds.  Respiratory: Effort normal. No respiratory distress.  GI: Soft.  Musculoskeletal:        General: Normal range of motion.     Cervical back: Normal range of motion.  Neurological: She is alert.  Skin: Skin is warm and dry.  Psychiatric: She has a normal mood and affect. Her speech is normal and behavior is normal. Cognition and memory are impaired. She expresses impulsivity. She expresses no homicidal and no suicidal ideation.    Review of Systems  Constitutional: Negative.   HENT: Negative.   Eyes: Negative.   Respiratory: Negative.   Cardiovascular: Negative.   Gastrointestinal: Negative.   Musculoskeletal: Negative.   Skin: Negative.   Neurological: Negative.   Psychiatric/Behavioral: Negative.     Blood pressure 105/61, pulse 83, temperature 98 F (36.7 C), temperature source Oral, resp. rate  18, height 5\' 1"  (1.549 m), weight 61.7 kg, SpO2 99 %.Body mass index is 25.7 kg/m.  General Appearance: Casual  Eye Contact:  Good  Speech:  Slurred  Volume:  Normal  Mood:  Euthymic  Affect:  Congruent  Thought Process:  Disorganized  Orientation:  Full (Time, Place, and Person)  Thought Content:  Rumination and Tangential  Suicidal Thoughts:  No  Homicidal Thoughts:  No  Memory:  Immediate;   Fair Recent;   Fair Remote;   Fair  Judgement:  Impaired  Insight:  Shallow  Psychomotor Activity:  Normal  Concentration:  Concentration: Fair  Recall:  [employer] of Knowledge:  Fair  Language:  Fair  Akathisia:  No  Handed:  Right  AIMS (if indicated):     Assets:  Desire for Improvement Housing Physical Health Social Support  ADL's:  Intact  Cognition:  Impaired,  Mild  Sleep:  Number of Hours: 8    Treatment Plan Summary: Daily contact with patient to assess and evaluate symptoms and progress in treatment, Medication management and Plan Patient appears to be coming back closer to her baseline.  Reviewing her medication I restarted her on Depakote 500 mg twice a day, Haldol 5 mg 3 times a day Cogentin twice a day all in addition to the Haldol decanoate shot with which she is apparently up-to-date.  Also restarted her thyroid replacement.  Supportive counseling and review of medications with the patient who is agreeable to all of it.  Likely she will be ready for discharge by tomorrow.  Continue 15-minute checks.  Observation Level/Precautions:  15 minute checks  Laboratory:  Chemistry Profile  Psychotherapy:    Medications:    Consultations:    Discharge Concerns:    Estimated LOS:  Other:     Physician Treatment Plan for Primary Diagnosis: Schizophrenia ([place]) Long Term Goal(s): Improvement in symptoms so as ready for discharge  Short Term Goals: Ability to verbalize feelings will improve and Ability to demonstrate self-control will improve  Physician Treatment Plan for  Secondary Diagnosis: Principal Problem:   Schizophrenia ([place]) Active Problems:   Mild intellectual disability   Congenital hypothyroidism without goiter  Long Term Goal(s): Improvement in symptoms so as ready for discharge  Short Term Goals: Ability to maintain clinical measurements within normal limits will improve and Compliance with prescribed medications will improve  I certify that inpatient services furnished can reasonably be expected to improve the patient's condition.    Alethia Berthold, MD 1/12/202112:53 PM

## 2019-06-07 NOTE — Tx Team (Addendum)
Interdisciplinary Treatment and Diagnostic Plan Update  06/07/2019 Time of Session: 900am Tamara [name] MRN: [id number]  Principal Diagnosis: <principal problem not specified>  Secondary Diagnoses: Active Problems:   Schizophrenia ([place])   Current Medications:  Current Facility-Administered Medications  Medication Dose Route Frequency Provider Last Rate Last Admin  . acetaminophen (TYLENOL) tablet 650 mg  650 mg Oral Q6H PRN Cristofano, Dorene Ar, MD      . alum & mag hydroxide-simeth (MAALOX/MYLANTA) 200-200-20 MG/5ML suspension 30 mL  30 mL Oral Q4H PRN Cristofano, Paul A, MD      . magnesium hydroxide (MILK OF MAGNESIA) suspension 30 mL  30 mL Oral Daily PRN Cristofano, Dorene Ar, MD       PTA Medications: Medications Prior to Admission  Medication Sig Dispense Refill Last Dose  . BANOPHEN 25 MG capsule Take 25 mg by mouth at bedtime.     . benztropine (COGENTIN) 1 MG tablet Take 1 tablet (1 mg total) by mouth 2 (two) times daily. 60 tablet 3   . cetirizine (ZYRTEC) 10 MG tablet Take 1 tablet (10 mg total) by mouth daily as needed for allergies. (Patient taking differently: Take 10 mg by mouth daily. )     . divalproex (DEPAKOTE ER) 500 MG 24 hr tablet Take 1 tablet (500 mg total) by mouth 2 (two) times daily. 60 tablet 3   . haloperidol (HALDOL) 5 MG tablet 1 in am 2 at hs 90 tablet 3   . haloperidol decanoate (HALDOL DECANOATE) 100 MG/ML injection Inject 100 mg into the muscle every 28 (twenty-eight) days.     Marland Kitchen levothyroxine (SYNTHROID) 88 MCG tablet Take 1 tablet (88 mcg total) by mouth daily before breakfast. 90 tablet 2   . LORazepam (ATIVAN) 1 MG tablet Take 1 tablet (1 mg total) by mouth 2 (two) times daily as needed for anxiety (agitation). 30 tablet 0     Patient Stressors: Marital or family conflict Medication change or noncompliance  Patient Strengths: Physical Health Supportive family/friends  Treatment Modalities: Medication Management, Group therapy, Case  management,  1 to 1 session with clinician, Psychoeducation, Recreational therapy.   Physician Treatment Plan for Primary Diagnosis: <principal problem not specified> Long Term Goal(s):     Short Term Goals:    Medication Management: Evaluate patient's response, side effects, and tolerance of medication regimen.  Therapeutic Interventions: 1 to 1 sessions, Unit Group sessions and Medication administration.  Evaluation of Outcomes: Progressing  Physician Treatment Plan for Secondary Diagnosis: Active Problems:   Schizophrenia ([place])  Long Term Goal(s):     Short Term Goals:       Medication Management: Evaluate patient's response, side effects, and tolerance of medication regimen.  Therapeutic Interventions: 1 to 1 sessions, Unit Group sessions and Medication administration.  Evaluation of Outcomes: Progressing   RN Treatment Plan for Primary Diagnosis: <principal problem not specified> Long Term Goal(s): Knowledge of disease and therapeutic regimen to maintain health will improve  Short Term Goals: Ability to identify and develop effective coping behaviors will improve  Medication Management: RN will administer medications as ordered by provider, will assess and evaluate patient's response and provide education to patient for prescribed medication. RN will report any adverse and/or side effects to prescribing provider.  Therapeutic Interventions: 1 on 1 counseling sessions, Psychoeducation, Medication administration, Evaluate responses to treatment, Monitor vital signs and CBGs as ordered, Perform/monitor CIWA, COWS, AIMS and Fall Risk screenings as ordered, Perform wound care treatments as ordered.  Evaluation of Outcomes: Progressing  LCSW Treatment Plan for Primary Diagnosis: <principal problem not specified> Long Term Goal(s): Safe transition to appropriate next level of care at discharge, Engage patient in therapeutic group addressing interpersonal concerns.  Short  Term Goals: Engage patient in aftercare planning with referrals and resources and Increase skills for wellness and recovery  Therapeutic Interventions: Assess for all discharge needs, 1 to 1 time with Social worker, Explore available resources and support systems, Assess for adequacy in community support network, Educate family and significant other(s) on suicide prevention, Complete Psychosocial Assessment, Interpersonal group therapy.  Evaluation of Outcomes: Progressing   Progress in Treatment: Attending groups: Yes. Participating in groups: No. Taking medication as prescribed: Yes. Toleration medication: Yes. Family/Significant other contact made: Yes, individual(s) contacted:  pts mother and legal guardian Patient understands diagnosis: No. Discussing patient identified problems/goals with staff: No. Medical problems stabilized or resolved: Yes. Denies suicidal/homicidal ideation: Yes. Issues/concerns per patient self-inventory: No. Other: N/A  New problem(s) identified: No, Describe:  none  New Short Term/Long Term Goal(s): elimination of AVH/symptoms of psychosis, medication management for mood stabilization; development of comprehensive mental wellness/sobriety plan.   Patient Goals:  "I don't know, I can't say"  Discharge Plan or Barriers: SPE pamphlet, Mobile Crisis information, and AA/NA information provided to patient for additional community support and resources. CSW assessing for appropriate referrals.  Reason for Continuation of Hospitalization: Medication stabilization  Estimated Length of Stay: 2-3 days  Recreational Therapy: Patient Stressors: N/A  Patient Goal: Patient will engage in groups without prompting or encouragement from LRT x3 group sessions within 5 recreation therapy group sessions  Attendees: Patient: Tamara [name] 06/07/2019 10:25 AM  Physician: Dr Weber Cooks MD 06/07/2019 10:25 AM  Nursing: Lyda Kalata, RN 06/07/2019 10:25 AM  RN Care  Manager: 06/07/2019 10:25 AM  Social Worker: Minette Brine Moton LCSW 06/07/2019 10:25 AM  Recreational Therapist: Roanna Epley CTRS LRT 06/07/2019 10:25 AM  Other: Sanjuana Kava LCSW  06/07/2019 10:25 AM  Other: Assunta Curtis LCSW 06/07/2019 10:25 AM  Other: 06/07/2019 10:25 AM    Scribe for Treatment Team: Mariann Laster Moton, LCSW 06/07/2019 10:25 AM

## 2019-06-07 NOTE — BHH Group Notes (Signed)
Feelings Around Diagnosis 06/07/2019 1PM  Type of Therapy/Topic:  Group Therapy:  Feelings about Diagnosis  Participation Level:  None   Description of Group:   This group will allow patients to explore their thoughts and feelings about diagnoses they have received. Patients will be guided to explore their level of understanding and acceptance of these diagnoses. Facilitator will encourage patients to process their thoughts and feelings about the reactions of others to their diagnosis and will guide patients in identifying ways to discuss their diagnosis with significant others in their lives. This group will be process-oriented, with patients participating in exploration of their own experiences, giving and receiving support, and processing challenge from other group members.   Therapeutic Goals: 1. Patient will demonstrate understanding of diagnosis as evidenced by identifying two or more symptoms of the disorder 2. Patient will be able to express two feelings regarding the diagnosis 3. Patient will demonstrate their ability to communicate their needs through discussion and/or role play  Summary of Patient Progress: No input provided. Pt sat quietly during session. Pt not able to program, displayed difficulty understanding today's group topic.    Therapeutic Modalities:   Cognitive Behavioral Therapy Brief Therapy Feelings Identification    Yvette Rack, LCSW 06/07/2019 2:09 PM

## 2019-06-07 NOTE — BHH Counselor (Signed)
Adult Comprehensive Assessment  Patient ID: Tamara [name], female   DOB: 11-20-1970, 49 y.o.   MRN: [id number]  Information Source: Information source: Patient(legal guardian)  Current Stressors:  Patient states their primary concerns and needs for treatment are:: "My mom came here for her and I ended up here" Patient states their goals for this hospitilization and ongoing recovery are:: "I don't know" Educational / Learning stressors: Per mother "she's slow" Employment / Job issues: unemployed on Engineer, building services / Lack of resources (include bankruptcy): disability Housing / Lack of housing: lives with mother Physical health (include injuries & life threatening diseases): none reported Substance abuse: denies  Living/Environment/Situation:  Living Arrangements: Parent Who else lives in the home?: pts mother How long has patient lived in current situation?: her whole life What is atmosphere in current home: Comfortable, Quarry manager, Supportive  Family History:  Marital status: Single Are you sexually active?: No Does patient have children?: No  Childhood History:  By whom was/is the patient raised?: Both parents Description of patient's relationship with caregiver when they were a child: Pt reports was good [name] description of current relationship with people who raised him/her: "good" Does patient have siblings?: Yes Number of Siblings: 4 Description of patient's current relationship with siblings: Good. A sister who is local and will pick pt up at discharge Did patient suffer any verbal/emotional/physical/sexual abuse as a child?: No Did patient suffer from severe childhood neglect?: No Has patient ever been sexually abused/assaulted/raped as an adolescent or adult?: No Was the patient ever a victim of a crime or a disaster?: No Witnessed domestic violence?: No Has patient been effected by domestic violence as an adult?: No  Education:  Highest grade of school  patient has completed: high school diploma Currently a student?: No Learning disability?: Yes What learning problems does patient have?: Per mother "she slow"  Employment/Work Situation:   Employment situation: On disability Why is patient on disability: per mother "Because she slow" How long has patient been on disability: Per mother since pt was 71 yo [name] job has been impacted by current illness: No What is the longest time patient has a held a job?: 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 [place] Where was the patient employed at that time?: Pt reports she worked there for a year Did You Receive Any Psychiatric Treatment/Services While in Passenger transport manager?: No Are There Guns or Other Weapons in [place]?: No Are These Psychologist, educational?: (N/A)  Financial Resources:   Financial resources: Murriel Hopper, Medicaid Does patient have a Programmer, applications or guardian?: Yes Name of representative payee or guardian: Lakindra Manikowski, mother [phone]  Alcohol/Substance Abuse:   What has been your use of drugs/alcohol within the last 12 months?: pt denies If attempted suicide, did drugs/alcohol play a role in this?: No Alcohol/Substance Abuse Treatment Hx: Denies past history Has alcohol/substance abuse ever caused legal problems?: No  Social Support System:   [name] Community Support System: Good Describe Community Support System: family Type of faith/religion: Engineer, manufacturing  Leisure/Recreation:   Leisure and Hobbies: Pt enjoys playing ball  Strengths/Needs:      Discharge Plan:   Currently receiving community mental health services: Yes (From [facility]) Patient states concerns and preferences for aftercare planning are: Per mother pt goes to [facility] Does patient have access to transportation?: Yes Does patient have financial barriers related to discharge medications?: No Will patient be returning to same living situation after discharge?:  Yes  Summary/Recommendations:   Summary and Recommendations (  to be completed by the evaluator): Pt is a 49 yo female living in [place], [place] ([place]) with her mother and legal guardian Tamara [name] [phone]. Pt presents to the hospital due to "going out of her mind" per her mother. Pt has a diagnosis of schizophrenia. Pt is single, has no children, unemployed on disability, has a good support system, denies history of abuse/trauma, and denies SA. Pt is currently seen at [facility] in [place] for outpatient mental health treatment. Pt denies SI/HI/AVH currently. Recommendations for pt include: crisis stabilization, therapeutic milieu, encourage group attendance and participation, medication management for mood stabilization, and development for comprehensive mental wellness plan. CSW assessing for appropriate referrals.  [place] MSW LCSW 06/07/2019 10:25 AM

## 2019-06-08 MED ORDER — BENZTROPINE MESYLATE 0.5 MG PO TABS: 0.5000 mg | ORAL_TABLET | Freq: Two times a day (BID) | ORAL | 1 refills | Status: AC

## 2019-06-08 MED ORDER — LEVOTHYROXINE SODIUM 88 MCG PO TABS: 88.0000 ug | ORAL_TABLET | Freq: Every day | ORAL | 1 refills | Status: DC

## 2019-06-08 MED ORDER — HALOPERIDOL 5 MG PO TABS: 5.0000 mg | ORAL_TABLET | Freq: Three times a day (TID) | ORAL | 1 refills | Status: DC

## 2019-06-08 MED ORDER — DIVALPROEX SODIUM 500 MG PO DR TAB: 500.0000 mg | DELAYED_RELEASE_TABLET | Freq: Two times a day (BID) | ORAL | 1 refills | Status: AC

## 2019-06-08 MED ORDER — LORAZEPAM 1 MG PO TABS: 1.0000 mg | ORAL_TABLET | Freq: Four times a day (QID) | ORAL | 0 refills | Status: DC | PRN

## 2019-06-08 NOTE — Plan of Care (Signed)
  Problem: Education: Goal: Knowledge of [facility] Education information/materials will improve Outcome: Adequate for Discharge Goal: Emotional status will improve Outcome: Adequate for Discharge Goal: Mental status will improve Outcome: Adequate for Discharge Goal: Verbalization of understanding the information provided will improve Outcome: Adequate for Discharge   Problem: Safety: Goal: Periods of time without injury will increase Outcome: Adequate for Discharge   Problem: Education: Goal: Mental status will improve Outcome: Adequate for Discharge   Problem: Activity: Goal: Interest or engagement in activities will improve Outcome: Adequate for Discharge

## 2019-06-08 NOTE — Progress Notes (Signed)
CSW team has been attempting to contact pts mother and legal guardian, Katharine Look but have been unable to get in contact with her as phone will ring once and then say call cannot be completed at this time.   Evalina Field, MSW, LCSW Clinical Social Work 06/08/2019 1:32 PM

## 2019-06-08 NOTE — Progress Notes (Signed)
D: Patient is aware of  Discharge this shift .  Patient denies suicidal /homicidal ideations. Patient received all belongings brought in  A: No Storage medications. Writer reviewed Discharge Summary, Suicide Risk Assessment, and Transitional Record. Patient also received Prescriptions   from  MD.  Aware  Of follow up appointment .  R: Patient left unit with no questions  Or concerns  With mother

## 2019-06-08 NOTE — Progress Notes (Signed)
CSW spoke with pts mother and legal guardian to inform her of discharge today. Mother reported she is currently at the doctor herself and will come pick pt up when she finishes. CSW provided her with nurses station phone number and gained verbal consent to schedule pt a follow up appointment with Dr. Loni Muse at [place], MSW, LCSW Clinical Social Work 06/08/2019 1:58 PM

## 2019-06-08 NOTE — BHH Suicide Risk Assessment (Signed)
[facility] Discharge Suicide Risk Assessment   Principal Problem: Schizophrenia [facility]) Discharge Diagnoses: Principal Problem:   Schizophrenia ([place]) Active Problems:   Mild intellectual disability   Congenital hypothyroidism without goiter   Total Time spent with patient: 30 minutes  Musculoskeletal: Strength & Muscle Tone: within normal limits Gait & Station: normal Patient leans: N/A  Psychiatric Specialty Exam: Review of Systems  Constitutional: Negative.   HENT: Negative.   Eyes: Negative.   Respiratory: Negative.   Cardiovascular: Negative.   Gastrointestinal: Negative.   Musculoskeletal: Negative.   Skin: Negative.   Neurological: Negative.   Psychiatric/Behavioral: Negative.     Blood pressure 105/61, pulse 83, temperature 98 F (36.7 C), temperature source Oral, resp. rate 18, height 5\' 1"  (1.549 m), weight 61.7 kg, SpO2 99 %.Body mass index is 25.7 kg/m.  General Appearance: Casual  Eye Contact::  Good  Speech:  Slow409  Volume:  Decreased  Mood:  Euthymic  Affect:  Congruent  Thought Process:  Coherent  Orientation:  Full (Time, Place, and Person)  Thought Content:  Logical and Rumination  Suicidal Thoughts:  No  Homicidal Thoughts:  No  Memory:  Immediate;   Fair Recent;   Fair Remote;   Fair  Judgement:  Fair  Insight:  Fair  Psychomotor Activity:  Normal  Concentration:  Fair  Recall:  [employer] of Knowledge:Fair  Language: Fair  Akathisia:  No  Handed:  Right  AIMS (if indicated):     Assets:  Communication Skills Housing Resilience Social Support  Sleep:  Number of Hours: 6  Cognition: Impaired,  Mild  ADL's:  Intact   Mental Status Per Nursing Assessment::   On Admission:  NA  Demographic Factors:  NA  Loss Factors: NA  Historical Factors: Impulsivity  Risk Reduction Factors:   Living with another person, especially a relative, Positive social support and Positive therapeutic relationship  Continued Clinical Symptoms:   Schizophrenia:   Paranoid or undifferentiated type  Cognitive Features That Contribute To Risk:  None    Suicide Risk:  Minimal: No identifiable suicidal ideation.  Patients presenting with no risk factors but with morbid ruminations; may be classified as minimal risk based on the severity of the depressive symptoms    Plan Of Care/Follow-up recommendations:  Activity:  Activity as tolerated Diet:  Regular diet Other:  Follow-up with outpatient treatment continue current medicine including Depakote and oral Haldol as these seem to really help with behavior.  Alethia Berthold, MD 06/08/2019, 9:25 AM

## 2019-06-08 NOTE — Plan of Care (Signed)
Patient developmentally delayed    Problem: Education: Goal: Emotional status will improve Outcome: Not Progressing Goal: Mental status will improve Outcome: Not Progressing

## 2019-06-08 NOTE — Progress Notes (Signed)
Patient was in the day room upon arrival to the unit. Patient was pleasant during assessment but is developmentally delayed. Patient unable to answer assessment questions. Patient observed interacting appropriately with staff and peers. Patient compliant with medication administration per MD orders. Patient being monitored Q 15 minutes for safety per unit protocol. Patient remains safe on the unit.

## 2019-06-08 NOTE — Progress Notes (Signed)
  [facility] Adult Case Management Discharge Plan :  Will you be returning to the same living situation after discharge:  Yes,  home At discharge, do you have transportation home?: Yes,  pts mother and sister will pick her up Do you have the ability to pay for your medications: Yes,  medicaid  Release of information consent forms completed and in the chart;    Patient to Follow up at: Follow-up Information    [facility] Follow up on 06/10/2019.   Why: You have an appointment with Dr. Loni Muse scheduled on Friday 06/10/2019 at 3:00pm. It will be in person. Please bring a copy of your disharge paperwork to your appointment. Thank You! Contact information: [address] [place] [address] [phone]           Next level of care provider has access to [place] and Suicide Prevention discussed: Yes,  SPE completed with pts mother and legal guardian  Have you used any form of tobacco in the last 30 days? (Cigarettes, Smokeless Tobacco, Cigars, and/or Pipes): No  Has patient been referred to the Quitline?: N/A patient is not a smoker  Patient has been referred for addiction treatment: N/A  Delfin Edis, LCSW 06/08/2019, 2:04 PM

## 2019-06-08 NOTE — Progress Notes (Signed)
Recreation Therapy Notes     Date: 06/08/2019  Time: 9:30 am  Location: Craft room  Behavioral response: Appropriate  Intervention Topic: Relaxation    Discussion/Intervention:  Group content today was focused on relaxation. The group defined relaxation and identified healthy ways to relax. Individuals expressed how much time they spend relaxing. Patients expressed how much their life would be if they did not make time for themselves to relax. The group stated ways they could improve their relaxation techniques in the future.  Individuals participated in the intervention "Time to Relax" where they had a chance to experience different relaxation techniques.  Clinical Observations/Feedback:  Patient came to group and explained that she likes to sit in the lounge chair and put her feet up to relax. Individual participated in the intervention during group and was social with peers and staff. Petina Muraski LRT/CTRS         Brittnei Jagiello 06/08/2019 11:15 AM

## 2019-06-08 NOTE — BHH Group Notes (Signed)
LCSW Group Therapy Note  06/08/2019 1:00 PM  Type of Therapy/Topic:  Group Therapy:  Emotion Regulation  Participation Level:  Minimal   Description of Group:   The purpose of this group is to assist patients in learning to regulate negative emotions and experience positive emotions. Patients will be guided to discuss ways in which they have been vulnerable to their negative emotions. These vulnerabilities will be juxtaposed with experiences of positive emotions or situations, and patients will be challenged to use positive emotions to combat negative ones. Special emphasis will be placed on coping with negative emotions in conflict situations, and patients will process healthy conflict resolution skills.  Therapeutic Goals: 1. Patient will identify two positive emotions or experiences to reflect on in order to balance out negative emotions 2. Patient will label two or more emotions that they find the most difficult to experience 3. Patient will demonstrate positive conflict resolution skills through discussion and/or role plays  Summary of Patient Progress: Patient attended group. Patient was vocal and active in group, though often required redirection to remain on topic.  Therapeutic Modalities:   Cognitive Behavioral Therapy Feelings Identification Dialectical Behavioral Therapy  Assunta Curtis, MSW, LCSW 06/08/2019 12:34 PM

## 2019-06-08 NOTE — Discharge Summary (Signed)
Physician Discharge Summary Note  Patient:  Tamara [name] is an 49 y.o., female MRN:  [id number] DOB:  [birth year] Patient phone:  [phone] (home)  Patient address:   [place] [address],  Total Time spent with patient: 30 minutes  Date of Admission:  06/06/2019 Date of Discharge: 06/08/19  Reason for Admission:  49 year old woman known to our service who has a history of schizophrenia and developmental disability.  She was brought to the emergency room by her sister 2 days ago with reports that the patient had decompensated and become agitated and hostile.  Patient was documented as being agitated and requiring forced medicine on first presentation to the emergency room.   Principal Problem: Schizophrenia [facility]) Discharge Diagnoses: Principal Problem:   Schizophrenia ([place]) Active Problems:   Mild intellectual disability   Congenital hypothyroidism without goiter   Past Psychiatric History: Patient has a long history of schizophrenia and developmental disability.  At her baseline she is described as being "sweet" by her family and in fact that is what we usually see when she is on her medicine.  She has been on combination of antipsychotic and mood stabilizing medicine pretty consistently for years.  Most recently had been on Depakote as well as Haldol decanoate with additional oral Haldol and Cogentin during the day.  Apparently recently the oral medicines may have been stopped.  Patient has no known history of suicidality.  When she is off medicine she can become manic and agitated although I do not know that she is ever really hurt anybody.  Past Medical History:  Past Medical History:  Diagnosis Date  . GERD (gastroesophageal reflux disease)   . IUD (intrauterine device) in place IUD was placed in 2016  . Leiomyoma of uterus   . Schizophrenia ([place])   . Thyroid disease    History reviewed. No pertinent surgical history. Family History: History reviewed.  No pertinent family history. Family Psychiatric  History: None known Social History:  Social History   Substance and Sexual Activity  Alcohol Use No     Social History   Substance and Sexual Activity  Drug Use No    Social History   Socioeconomic History  . Marital status: Single    Spouse name: Not on file  . Number of children: Not on file  . Years of education: Not on file  . Highest education level: Not on file  Occupational History  . Not on file  Tobacco Use  . Smoking status: Never Smoker  . Smokeless tobacco: Never Used  Substance and Sexual Activity  . Alcohol use: No  . Drug use: No  . Sexual activity: Yes    Birth control/protection: Surgical  Other Topics Concern  . Not on file  Social History Narrative  . Not on file   Social Determinants of Health   Financial Resource Strain:   . Difficulty of Paying Living Expenses: Not on file  Food Insecurity:   . Worried About Charity fundraiser in the Last Year: Not on file  . Ran Out of Food in the Last Year: Not on file  Transportation Needs:   . Lack of Transportation (Medical): Not on file  . Lack of Transportation (Non-Medical): Not on file  Physical Activity:   . Days of Exercise per Week: Not on file  . Minutes of Exercise per Session: Not on file  Stress:   . Feeling of Stress : Not on file  Social Connections:   .  Frequency of Communication with Friends and Family: Not on file  . Frequency of Social Gatherings with Friends and Family: Not on file  . Attends Religious Services: Not on file  . Active Member of Clubs or Organizations: Not on file  . Attends Archivist Meetings: Not on file  . Marital Status: Not on file    Hospital Course:  Patient remained on the [facility] unit for 2 days. The patient stabilized on medication and therapy. Patient was discharged on Cogentin 0.5 mg BID, Depakote DR 500 mg Q12H, Haldol 5 mg TID, Synthroid 88 mcg Daily, Ativan 1 mg Q6H PRN. It was also reported  that patient is up to date with her Haldol Decanoate IM through [facility] Patient has shown improvement with improved mood, affect, sleep, appetite, and interaction. Patient has attended group and participated. Patient has been seen in the day room interacting with peers and staff appropriately. Patient denies any SI/HI/AVH and contracts for safety. Patient agrees to follow up at [facility]. Patient is provided with prescriptions for their medications upon discharge.   Physical Findings: AIMS:  , ,  ,  ,    CIWA:    COWS:     Musculoskeletal: Strength & Muscle Tone: within normal limits Gait & Station: normal Patient leans: N/A  Psychiatric Specialty Exam: Physical Exam  Nursing note and vitals reviewed. Constitutional: She is oriented to person, place, and time. She appears well-developed and well-nourished.  Cardiovascular: Normal rate.  Respiratory: Effort normal.  Musculoskeletal:        General: Normal range of motion.  Neurological: She is alert and oriented to person, place, and time.  Skin: Skin is warm.    Review of Systems  Constitutional: Negative.   HENT: Negative.   Eyes: Negative.   Respiratory: Negative.   Cardiovascular: Negative.   Gastrointestinal: Negative.   Genitourinary: Negative.   Musculoskeletal: Negative.   Skin: Negative.   Neurological: Negative.   Psychiatric/Behavioral: Negative.     Blood pressure 105/61, pulse 83, temperature 98 F (36.7 C), temperature source Oral, resp. rate 18, height 5\' 1"  (1.549 m), weight 61.7 kg, SpO2 99 %.Body mass index is 25.7 kg/m.   General Appearance: Casual  Eye Contact::  Good  Speech:  Slow409  Volume:  Decreased  Mood:  Euthymic  Affect:  Congruent  Thought Process:  Coherent  Orientation:  Full (Time, Place, and Person)  Thought Content:  Logical and Rumination  Suicidal Thoughts:  No  Homicidal Thoughts:  No  Memory:  Immediate;   Fair Recent;   Fair Remote;   Fair   Judgement:  Fair  Insight:  Fair  Psychomotor Activity:  Normal  Concentration:  Fair  Recall:  [employer] [place]  Language: Fair  Akathisia:  No  Handed:  Right  AIMS (if indicated):     Assets:  Communication Skills Housing Resilience Social Support  Sleep:  Number of Hours: 6  Cognition: Impaired,  Mild  ADL's:  Intact   Have you used any form of tobacco in the last 30 days? (Cigarettes, Smokeless Tobacco, Cigars, and/or Pipes): No  Has this patient used any form of tobacco in the last 30 days? (Cigarettes, Smokeless Tobacco, Cigars, and/or Pipes) Yes, No  Blood Alcohol level:  Lab Results  Component Value Date   [facility] <10 06/05/2019   ETH <10 [id number]    Metabolic Disorder Labs:  Lab Results  Component Value Date   HGBA1C 5.5 09/11/2015   Lab Results  Component Value Date   PROLACTIN 163.4 (H) 09/11/2015   Lab Results  Component Value Date   CHOL 179 06/07/2019   TRIG 90 06/07/2019   HDL 63 06/07/2019   CHOLHDL 2.8 06/07/2019   VLDL 18 06/07/2019   LDLCALC 98 06/07/2019   LDLCALC 127 (H) 09/11/2015    See Psychiatric Specialty Exam and Suicide Risk Assessment completed by Attending Physician prior to discharge.  Discharge destination:  Home  Is patient on multiple antipsychotic therapies at discharge:  No   Has Patient had three or more failed trials of antipsychotic monotherapy by history:  No  Recommended Plan for Multiple Antipsychotic Therapies: NA  Discharge Instructions    Diet - low sodium heart healthy   Complete by: As directed    Increase activity slowly   Complete by: As directed      Allergies as of 06/08/2019   No Known Allergies     Medication List    STOP taking these medications   Banophen 25 mg capsule Generic drug: diphenhydrAMINE   divalproex 500 MG 24 hr tablet Commonly known as: DEPAKOTE ER Replaced by: divalproex 500 MG DR tablet     TAKE these medications     Indication  benztropine 0.5 MG  tablet Commonly known as: COGENTIN Take 1 tablet (0.5 mg total) by mouth 2 (two) times daily. What changed:   medication strength  how much to take  Indication: Extrapyramidal Reaction caused by Medications   cetirizine 10 MG tablet Commonly known as: ZYRTEC Take 1 tablet (10 mg total) by mouth daily as needed for allergies. What changed: when to take this  Indication: Hayfever   divalproex 500 MG DR tablet Commonly known as: DEPAKOTE Take 1 tablet (500 mg total) by mouth every 12 (twelve) hours. Replaces: divalproex 500 MG 24 hr tablet  Indication: Schizophrenia   haloperidol 5 MG tablet Commonly known as: HALDOL Take 1 tablet (5 mg total) by mouth 3 (three) times daily. What changed:   how much to take  how to take this  when to take this  additional instructions  Indication: schizophrenia   haloperidol decanoate 100 MG/ML injection Commonly known as: HALDOL DECANOATE Inject 100 mg into the muscle every 28 (twenty-eight) days.  Indication: Schizophrenia   levothyroxine 88 MCG tablet Commonly known as: SYNTHROID Take 1 tablet (88 mcg total) by mouth daily at 6 (six) AM. Start taking on: June 09, 2019 What changed: when to take this  Indication: Underactive Thyroid   LORazepam 1 MG tablet Commonly known as: ATIVAN Take 1 tablet (1 mg total) by mouth every 6 (six) hours as needed for anxiety or sedation. What changed:   when to take this  reasons to take this  Indication: Feeling Anxious      Follow-up Information    Pc, [facility] Follow up on 06/10/2019.   Why: You have an appointment with Dr. Loni Muse scheduled on Friday 06/10/2019 at 3:00pm. It will be in person. Please bring a copy of your disharge paperwork to your appointment. Thank You! Contact information: [place] [address] [phone]           Follow-up recommendations:  Continue activity as tolerated. Continue diet as recommended by your PCP. Ensure  to keep all appointments with outpatient providers.  Comments:  Patient is instructed prior to discharge to: Take all medications as prescribed by his/her mental healthcare provider. Report any adverse effects and or reactions from the medicines to his/her outpatient provider promptly. Patient  has been instructed & cautioned: To not engage in alcohol and or illegal drug use while on prescription medicines. In the event of worsening symptoms, patient is instructed to call the crisis hotline, 911 and or go to the nearest ED for appropriate evaluation and treatment of symptoms. To follow-up with his/her primary care provider for your other medical issues, concerns and or health care needs.    Signed: [place], FNP 06/08/2019, 4:17 PM

## 2019-09-19 DIAGNOSIS — M542 Cervicalgia: Secondary | ICD-10-CM | POA: Insufficient documentation

## 2019-09-19 DIAGNOSIS — G8929 Other chronic pain: Secondary | ICD-10-CM | POA: Insufficient documentation

## 2019-11-18 DIAGNOSIS — R7303 Prediabetes: Secondary | ICD-10-CM | POA: Insufficient documentation

## 2020-03-26 DIAGNOSIS — C50919 Malignant neoplasm of unspecified site of unspecified female breast: Secondary | ICD-10-CM

## 2020-03-26 HISTORY — PX: BREAST BIOPSY: SHX20

## 2020-03-26 HISTORY — DX: Malignant neoplasm of unspecified site of unspecified female breast: C50.919

## 2020-04-03 ENCOUNTER — Inpatient Hospital Stay
Admission: EM | Admit: 2020-04-03 | Discharge: 2020-04-10 | DRG: 598 | Disposition: A | Discharge status: Home / Self Care | Payer: Medicaid Other | Attending: Internal Medicine | Admitting: Internal Medicine

## 2020-04-03 ENCOUNTER — Other Ambulatory Visit: Payer: Self-pay

## 2020-04-03 ENCOUNTER — Emergency Department: Payer: Medicaid Other

## 2020-04-03 DIAGNOSIS — Z515 Encounter for palliative care: Secondary | ICD-10-CM

## 2020-04-03 DIAGNOSIS — Z20822 Contact with and (suspected) exposure to covid-19: Secondary | ICD-10-CM | POA: Diagnosis present

## 2020-04-03 DIAGNOSIS — E079 Disorder of thyroid, unspecified: Secondary | ICD-10-CM | POA: Diagnosis present

## 2020-04-03 DIAGNOSIS — Z79899 Other long term (current) drug therapy: Secondary | ICD-10-CM

## 2020-04-03 DIAGNOSIS — R000 Tachycardia, unspecified: Secondary | ICD-10-CM | POA: Diagnosis present

## 2020-04-03 DIAGNOSIS — N63 Unspecified lump in unspecified breast: Secondary | ICD-10-CM

## 2020-04-03 DIAGNOSIS — N6459 Other signs and symptoms in breast: Secondary | ICD-10-CM

## 2020-04-03 DIAGNOSIS — E876 Hypokalemia: Secondary | ICD-10-CM | POA: Diagnosis not present

## 2020-04-03 DIAGNOSIS — J189 Pneumonia, unspecified organism: Secondary | ICD-10-CM

## 2020-04-03 DIAGNOSIS — R6510 Systemic inflammatory response syndrome (SIRS) of non-infectious origin without acute organ dysfunction: Secondary | ICD-10-CM | POA: Diagnosis present

## 2020-04-03 DIAGNOSIS — E031 Congenital hypothyroidism without goiter: Secondary | ICD-10-CM | POA: Diagnosis present

## 2020-04-03 DIAGNOSIS — N6320 Unspecified lump in the left breast, unspecified quadrant: Secondary | ICD-10-CM | POA: Diagnosis not present

## 2020-04-03 DIAGNOSIS — N644 Mastodynia: Secondary | ICD-10-CM

## 2020-04-03 DIAGNOSIS — F200 Paranoid schizophrenia: Secondary | ICD-10-CM | POA: Diagnosis present

## 2020-04-03 DIAGNOSIS — N6322 Unspecified lump in the left breast, upper inner quadrant: Secondary | ICD-10-CM | POA: Diagnosis not present

## 2020-04-03 DIAGNOSIS — D649 Anemia, unspecified: Secondary | ICD-10-CM | POA: Diagnosis present

## 2020-04-03 DIAGNOSIS — E871 Hypo-osmolality and hyponatremia: Secondary | ICD-10-CM | POA: Diagnosis present

## 2020-04-03 DIAGNOSIS — C779 Secondary and unspecified malignant neoplasm of lymph node, unspecified: Secondary | ICD-10-CM | POA: Diagnosis present

## 2020-04-03 DIAGNOSIS — Z975 Presence of (intrauterine) contraceptive device: Secondary | ICD-10-CM

## 2020-04-03 DIAGNOSIS — F209 Schizophrenia, unspecified: Secondary | ICD-10-CM | POA: Diagnosis present

## 2020-04-03 DIAGNOSIS — Z803 Family history of malignant neoplasm of breast: Secondary | ICD-10-CM

## 2020-04-03 DIAGNOSIS — F70 Mild intellectual disabilities: Secondary | ICD-10-CM | POA: Diagnosis present

## 2020-04-03 DIAGNOSIS — Z79890 Hormone replacement therapy (postmenopausal): Secondary | ICD-10-CM

## 2020-04-03 DIAGNOSIS — K219 Gastro-esophageal reflux disease without esophagitis: Secondary | ICD-10-CM | POA: Diagnosis present

## 2020-04-03 DIAGNOSIS — I959 Hypotension, unspecified: Secondary | ICD-10-CM | POA: Diagnosis present

## 2020-04-03 DIAGNOSIS — R451 Restlessness and agitation: Secondary | ICD-10-CM

## 2020-04-03 DIAGNOSIS — C50212 Malignant neoplasm of upper-inner quadrant of left female breast: Principal | ICD-10-CM | POA: Diagnosis present

## 2020-04-03 LAB — COMPREHENSIVE METABOLIC PANEL
ALT: 13 U/L (ref 0–44)
AST: 25 U/L (ref 15–41)
Albumin: 4.2 g/dL (ref 3.5–5.0)
Alkaline Phosphatase: 68 U/L (ref 38–126)
Anion gap: 13 (ref 5–15)
BUN: <5 mg/dL — ABNORMAL LOW (ref 6–20)
CO2: 24 mmol/L (ref 22–32)
Calcium: 9.5 mg/dL (ref 8.9–10.3)
Chloride: 88 mmol/L — ABNORMAL LOW (ref 98–111)
Creatinine, Ser: 0.65 mg/dL (ref 0.44–1.00)
GFR, Estimated: >60 mL/min (ref >60)
Glucose, Bld: 153 mg/dL — ABNORMAL HIGH (ref 70–99)
Potassium: 3.1 mmol/L — ABNORMAL LOW (ref 3.5–5.1)
Sodium: 125 mmol/L — ABNORMAL LOW (ref 135–145)
Total Bilirubin: 0.4 mg/dL (ref 0.3–1.2)
Total Protein: 8.6 g/dL — ABNORMAL HIGH (ref 6.5–8.1)

## 2020-04-03 LAB — RESPIRATORY PANEL BY RT PCR (FLU A&B, COVID)
Influenza A by PCR: NEGATIVE
Influenza B by PCR: NEGATIVE
SARS Coronavirus 2 by RT PCR: NEGATIVE

## 2020-04-03 LAB — CBC WITH DIFFERENTIAL/PLATELET
Abs Immature Granulocytes: 0.17 10*3/uL — ABNORMAL HIGH (ref 0.00–0.07)
Basophils Absolute: 0 10*3/uL (ref 0.0–0.1)
Basophils Relative: 0 %
Eosinophils Absolute: 0 10*3/uL (ref 0.0–0.5)
Eosinophils Relative: 0 %
HCT: 35.8 % — ABNORMAL LOW (ref 36.0–46.0)
Hemoglobin: 12.5 g/dL (ref 12.0–15.0)
Immature Granulocytes: 1 %
Lymphocytes Relative: 12 %
Lymphs Abs: 1.5 10*3/uL (ref 0.7–4.0)
MCH: 30.6 pg (ref 26.0–34.0)
MCHC: 34.9 g/dL (ref 30.0–36.0)
MCV: 87.5 fL (ref 80.0–100.0)
Monocytes Absolute: 1.3 10*3/uL — ABNORMAL HIGH (ref 0.1–1.0)
Monocytes Relative: 10 %
Neutro Abs: 9.2 10*3/uL — ABNORMAL HIGH (ref 1.7–7.7)
Neutrophils Relative %: 77 %
Platelets: 342 10*3/uL (ref 150–400)
RBC: 4.09 MIL/uL (ref 3.87–5.11)
RDW: 12.6 % (ref 11.5–15.5)
WBC: 12.1 10*3/uL — ABNORMAL HIGH (ref 4.0–10.5)
nRBC: 0 % (ref 0.0–0.2)

## 2020-04-03 LAB — ETHANOL: Alcohol, Ethyl (B): <10 mg/dL (ref <10)

## 2020-04-03 MED ORDER — LORATADINE 10 MG PO TABS
10.0000 mg | ORAL_TABLET | Freq: Every day | ORAL | Status: DC
  Administered 2020-04-04 – 2020-04-10 (×6): 10 mg via ORAL
  Filled 2020-04-03 (×6): qty 1

## 2020-04-03 MED ORDER — HALOPERIDOL LACTATE 5 MG/ML IJ SOLN
5.0000 mg | Freq: Once | INTRAMUSCULAR | Status: AC
  Administered 2020-04-03: 5 mg via INTRAMUSCULAR
  Filled 2020-04-03: qty 1

## 2020-04-03 MED ORDER — BENZTROPINE MESYLATE 0.5 MG PO TABS
0.5000 mg | ORAL_TABLET | Freq: Two times a day (BID) | ORAL | Status: DC
  Filled 2020-04-03: qty 1

## 2020-04-03 MED ORDER — SODIUM CHLORIDE 0.9 % IV BOLUS
1000.0000 mL | Freq: Once | INTRAVENOUS | Status: AC
  Administered 2020-04-03: 1000 mL via INTRAVENOUS

## 2020-04-03 MED ORDER — HALOPERIDOL 5 MG PO TABS
5.0000 mg | ORAL_TABLET | Freq: Three times a day (TID) | ORAL | Status: DC
  Administered 2020-04-03 – 2020-04-10 (×20): 5 mg via ORAL
  Filled 2020-04-03 (×24): qty 1

## 2020-04-03 MED ORDER — HALOPERIDOL 5 MG PO TABS
5.0000 mg | ORAL_TABLET | Freq: Three times a day (TID) | ORAL | Status: DC
  Filled 2020-04-03: qty 1

## 2020-04-03 MED ORDER — HYDROXYZINE HCL 25 MG PO TABS
25.0000 mg | ORAL_TABLET | Freq: Three times a day (TID) | ORAL | Status: DC
  Administered 2020-04-03 – 2020-04-10 (×19): 25 mg via ORAL
  Filled 2020-04-03 (×19): qty 1

## 2020-04-03 MED ORDER — POTASSIUM CHLORIDE CRYS ER 20 MEQ PO TBCR
40.0000 meq | EXTENDED_RELEASE_TABLET | Freq: Once | ORAL | Status: AC
  Administered 2020-04-03: 40 meq via ORAL
  Filled 2020-04-03: qty 2

## 2020-04-03 MED ORDER — DIPHENHYDRAMINE HCL 50 MG/ML IJ SOLN
25.0000 mg | Freq: Once | INTRAMUSCULAR | Status: AC
  Administered 2020-04-03: 25 mg via INTRAMUSCULAR

## 2020-04-03 MED ORDER — MIDAZOLAM HCL 2 MG/2ML IJ SOLN
2.0000 mg | Freq: Once | INTRAMUSCULAR | Status: AC
  Administered 2020-04-03: 2 mg via INTRAMUSCULAR
  Filled 2020-04-03: qty 2

## 2020-04-03 MED ORDER — DIVALPROEX SODIUM 500 MG PO DR TAB
500.0000 mg | DELAYED_RELEASE_TABLET | Freq: Two times a day (BID) | ORAL | Status: DC
  Filled 2020-04-03: qty 1

## 2020-04-03 MED ORDER — LEVOTHYROXINE SODIUM 88 MCG PO TABS
88.0000 ug | ORAL_TABLET | Freq: Every day | ORAL | Status: DC
  Filled 2020-04-03: qty 1

## 2020-04-03 MED ORDER — DIPHENHYDRAMINE HCL 50 MG/ML IJ SOLN
25.0000 mg | Freq: Once | INTRAMUSCULAR | Status: DC
  Filled 2020-04-03: qty 1

## 2020-04-03 MED ORDER — DIPHENHYDRAMINE HCL 25 MG PO CAPS: 25.0000 mg | ORAL_CAPSULE | Freq: Four times a day (QID) | ORAL | Status: DC | PRN

## 2020-04-03 MED ORDER — ENOXAPARIN SODIUM 40 MG/0.4ML ~~LOC~~ SOLN
40.0000 mg | SUBCUTANEOUS | Status: DC
  Administered 2020-04-04 – 2020-04-09 (×5): 40 mg via SUBCUTANEOUS
  Filled 2020-04-03 (×6): qty 0.4

## 2020-04-03 MED ORDER — BENZTROPINE MESYLATE 0.5 MG PO TABS
0.5000 mg | ORAL_TABLET | Freq: Two times a day (BID) | ORAL | Status: DC
  Administered 2020-04-03 – 2020-04-10 (×13): 0.5 mg via ORAL
  Filled 2020-04-03 (×16): qty 1

## 2020-04-03 MED ORDER — DIVALPROEX SODIUM 500 MG PO DR TAB
500.0000 mg | DELAYED_RELEASE_TABLET | Freq: Two times a day (BID) | ORAL | Status: DC
  Administered 2020-04-03 – 2020-04-10 (×13): 500 mg via ORAL
  Filled 2020-04-03 (×16): qty 1

## 2020-04-03 MED ORDER — LEVOTHYROXINE SODIUM 88 MCG PO TABS
88.0000 ug | ORAL_TABLET | Freq: Every day | ORAL | Status: DC
  Administered 2020-04-06 – 2020-04-10 (×5): 88 ug via ORAL
  Filled 2020-04-03 (×8): qty 1

## 2020-04-03 NOTE — ED Notes (Signed)
Pt sister came to the desk stating the pt needs pysch eval due to manic behavior and has attempted to run out into traffic, when walking the pt back to be triaged the pt becomes upset stating she is not the pt , the sisters daughter who is a minor is the pt , pt becomes more upset that she has been lied to . Able to get the pt sit for a few minute to await triage.

## 2020-04-03 NOTE — Consult Note (Signed)
[facility] Consult   Reason for Consult: Consult for this 49 year old woman with schizophrenia and intellectual disability brought to the hospital because of decompensation and mental state Referring Physician: Quentin Cornwall Patient Identification: Tamara [name] MRN:  [id number] Principal Diagnosis: Schizophrenia, paranoid type ([place]) Diagnosis:  Principal Problem:   Schizophrenia, paranoid type ([place]) Active Problems:   Mild intellectual disability   Congenital hypothyroidism without goiter   Total Time spent with patient: 1 hour  Subjective:   Tamara [name] is a 49 y.o. female patient admitted with "this ain't my hospital".  HPI: Patient seen chart reviewed.  Patient known from previous encounters.  48 year old woman with schizophrenia and intellectual disability.  Patient was agitated and disorganized and focused on complaining about the process of how she got here and unable to be redirected.  Sister was reached by telephone and reports that the patient has decompensated for the past 3 or 4 days.  Has not been sleeping at night.  Has become increasingly agitated disorganized in her behavior and speech.  Yelling and aggressive.  Running out into the street.  No known precipitant.  Patient is reported to have been compliant with her medicine.  No no new stressor.  No substance abuse.  Emergency room physician does note that the patient appears to have a breast mass that has not been addressed medically.  Past Psychiatric History: Past history of longstanding chronic mental illness.  Intermittent decompensation similar to what we are seeing now.  Usually stabilized on Haldol and Depakote  Risk to Self:   Risk to Others:   Prior Inpatient Therapy:   Prior Outpatient Therapy:    Past Medical History:  Past Medical History:  Diagnosis Date  . GERD (gastroesophageal reflux disease)   . IUD (intrauterine device) in place IUD was placed in 2016  . Leiomyoma of uterus   .  Schizophrenia ([place])   . Thyroid disease    No past surgical history on file. Family History: No family history on file. Family Psychiatric  History: None reported Social History:  Social History   Substance and Sexual Activity  Alcohol Use No     Social History   Substance and Sexual Activity  Drug Use No    Social History   Socioeconomic History  . Marital status: Single    Spouse name: Not on file  . Number of children: Not on file  . Years of education: Not on file  . Highest education level: Not on file  Occupational History  . Not on file  Tobacco Use  . Smoking status: Never Smoker  . Smokeless tobacco: Never Used  Substance and Sexual Activity  . Alcohol use: No  . Drug use: No  . Sexual activity: Yes    Birth control/protection: Surgical  Other Topics Concern  . Not on file  Social History Narrative  . Not on file   Social Determinants of Health   Financial Resource Strain:   . Difficulty of Paying Living Expenses: Not on file  Food Insecurity:   . Worried About Charity fundraiser in the Last Year: Not on file  . Ran Out of Food in the Last Year: Not on file  Transportation Needs:   . Lack of Transportation (Medical): Not on file  . Lack of Transportation (Non-Medical): Not on file  Physical Activity:   . Days of Exercise per Week: Not on file  . Minutes of Exercise per Session: Not on file  Stress:   . Feeling  of Stress : Not on file  Social Connections:   . Frequency of Communication with Friends and Family: Not on file  . Frequency of Social Gatherings with Friends and Family: Not on file  . Attends Religious Services: Not on file  . Active Member of Clubs or Organizations: Not on file  . Attends Archivist Meetings: Not on file  . Marital Status: Not on file   Additional Social History:    Allergies:  No Known Allergies  Labs: No results found for this or any previous visit (from the past 48 hour(s)).  Current  Facility-Administered Medications  Medication Dose Route Frequency Provider Last Rate Last Admin  . benztropine (COGENTIN) tablet 0.5 mg  0.5 mg Oral BID Genell Thede T, MD      . divalproex (DEPAKOTE) DR tablet 500 mg  500 mg Oral Q12H Marilou Barnfield T, MD      . haloperidol (HALDOL) tablet 5 mg  5 mg Oral TID Jannely Henthorn, Madie Reno, MD      . Derrill Memo ON 04/04/2020] levothyroxine (SYNTHROID) tablet 88 mcg  88 mcg Oral Q0600 Jayce Kainz, Madie Reno, MD       Current Outpatient Medications  Medication Sig Dispense Refill  . benztropine (COGENTIN) 0.5 MG tablet Take 1 tablet (0.5 mg total) by mouth 2 (two) times daily. 60 tablet 1  . cetirizine (ZYRTEC) 10 MG tablet Take 1 tablet (10 mg total) by mouth daily as needed for allergies. (Patient taking differently: Take 10 mg by mouth daily. )    . divalproex (DEPAKOTE) 500 MG DR tablet Take 1 tablet (500 mg total) by mouth every 12 (twelve) hours. 60 tablet 1  . haloperidol (HALDOL) 5 MG tablet Take 1 tablet (5 mg total) by mouth 3 (three) times daily. 90 tablet 1  . haloperidol decanoate (HALDOL DECANOATE) 100 MG/ML injection Inject 100 mg into the muscle every 28 (twenty-eight) days.    Marland Kitchen levothyroxine (SYNTHROID) 88 MCG tablet Take 1 tablet (88 mcg total) by mouth daily at 6 (six) AM. 30 tablet 1  . LORazepam (ATIVAN) 1 MG tablet Take 1 tablet (1 mg total) by mouth every 6 (six) hours as needed for anxiety or sedation. 30 tablet 0    Musculoskeletal: Strength & Muscle Tone: within normal limits Gait & Station: normal Patient leans: N/A  Psychiatric Specialty Exam: Physical Exam Vitals and nursing note reviewed.  Constitutional:      Appearance: She is well-developed.  HENT:     Head: Normocephalic and atraumatic.  Eyes:     Conjunctiva/sclera: Conjunctivae normal.     Pupils: Pupils are equal, round, and reactive to light.  Cardiovascular:     Heart sounds: Normal heart sounds.  Pulmonary:     Effort: Pulmonary effort is normal.  Abdominal:      Palpations: Abdomen is soft.  Musculoskeletal:        General: Normal range of motion.     Cervical back: Normal range of motion.  Skin:    General: Skin is warm and dry.  Neurological:     Mental Status: She is alert.  Psychiatric:        Attention and Perception: She is inattentive.        Mood and Affect: Mood is anxious. Affect is labile and angry.        Speech: Speech is rapid and pressured.        Behavior: Behavior is agitated.        Thought Content: Thought content is  paranoid.        Cognition and Memory: Cognition is impaired. Memory is impaired.        Judgment: Judgment is impulsive.     Review of Systems  Constitutional: Negative.   HENT: Negative.   Eyes: Negative.   Respiratory: Negative.   Cardiovascular: Negative.   Gastrointestinal: Negative.   Musculoskeletal: Negative.   Skin: Negative.   Neurological: Negative.   Psychiatric/Behavioral: Positive for behavioral problems, confusion and sleep disturbance. The patient is nervous/anxious.     Blood pressure (!) 143/52, pulse (!) 122, temperature 98.1 F (36.7 C), temperature source Oral, resp. rate 17, height 5\' 1"  (1.549 m), weight 63.5 kg, SpO2 98 %.Body mass index is 26.45 kg/m.  General Appearance: Casual  Eye Contact:  Good  Speech:  Pressured  Volume:  Increased  Mood:  Irritable  Affect:  Congruent  Thought Process:  Disorganized  Orientation:  Negative  Thought Content:  Illogical, Paranoid Ideation, Rumination and Tangential  Suicidal Thoughts:  No  Homicidal Thoughts:  No  Memory:  Immediate;   Fair Recent;   Poor Remote;   Poor  Judgement:  Impaired  Insight:  Shallow  Psychomotor Activity:  Restlessness  Concentration:  Concentration: Fair  Recall:  [employer]:  Fair  Language:  Fair  Akathisia:  No  Handed:  Right  AIMS (if indicated):     Assets:  Desire for Improvement Housing Resilience Social Support  ADL's:  Impaired  Cognition:  Impaired,  Mild  Sleep:         Treatment Plan Summary: Daily contact with patient to assess and evaluate symptoms and progress in treatment, Medication management and Plan Patient is currently irritable disorganized loud but not threatening.  Labs so far unremarkable but a investigation of the breast mass is still being worked up.  Depakote level ordered.  I have restarted Haldol and Depakote and Cogentin as previously used.  Patient will be pended for admission once medically stabilized.  Disposition: Recommend psychiatric Inpatient admission when medically cleared.  Alethia Berthold, MD 04/03/2020 4:23 PM

## 2020-04-03 NOTE — H&P (Addendum)
History and Physical   Tamara [name] ZOX:[id number] DOB: 09-13-1970 DOA: 04/03/2020  PCP: [facility] [place]  Outpatient Specialists: [facility] Hematology referral, no show per chart review Patient coming from: home  I have personally briefly reviewed patient's old medical records in [place].  Chief Concern: left breast mass  HPI: Tamara [name] is a 49 y.o. female with medical history significant for hypothyroid congenital, mild intellectual disability, schizophrenia, presented to the emergency department acutely and agitated with screaming. Patient was emergently IVC due to concern for acute psychosis.  While EKG leads were being place, left breast mass was discovered. Ultrasound showed mass concerning for malignancy.  She was alert to self, she states her age is in the 48s, she states that she is in the hospital, and that the year is 2021.  Review of system was negative for headache, fever, chills, chest pain, shortness of breath, dysphagia, nausea, vomiting, cough, chest pain, abdominal pain, diarrhea, dysuria, hematuria.  Chart review: She was referred for hematology evaluation for an appointment on 12/14/2019. During her pelvic examination under general anesthesia, a left breast mass was discovered. She was referred to hematology. Per chart review, it is much appear that she showed up to her appointment.  ED Course: Discussed with ED provider, admit to medicine for further inpatient work-up of left breast mass  Review of Systems: As per HPI otherwise 10 point review of systems negative.  Assessment/Plan  Principal Problem:   Left breast mass Active Problems:   Schizophrenia, paranoid type (HCC)   Mild intellectual disability   Congenital hypothyroidism without goiter   Schizophrenia (HCC)   Left breast mass-this post ultrasound of the left breast including the axilla -For outpatient follow-up in setting of poor insight on medical diagnosis/prognosis -Mass at the  1 o clock location, with the approximate size of 6 x 7 cm concerning for malignancy -Unknown duration of mass -General surgery has been consulted and states will see patient -Bilateral diagnostic mammogram has been ordered -A.m. provider to consult oncology inpatient if indicated  Congenital hypothyroidism-resumed levothyroxine  Schizophrenia-resumed meds, per RN in ED, patient will wander if she is not being monitored - 1:1 observation ordered  Mild intellectual disability-appears at baseline  DVT prophylaxis: Enoxaparin Code Status: Full code Diet: Regular Family Communication: attempted to call mother however no one picked up at the home and  Mobile phone numbers listed Disposition Plan: Pending general surgery evaluation and possible oncology evaluation Consults called: General surgery Admission status: observation with telemetry  Past Medical History:  Diagnosis Date  . GERD (gastroesophageal reflux disease)   . IUD (intrauterine device) in place IUD was placed in 2016  . Leiomyoma of uterus   . Schizophrenia ([place])   . Thyroid disease    No past surgical history on file.  Social History:  reports that she has never smoked. She has never used smokeless tobacco. She reports that she does not drink alcohol and does not use drugs.  No Known Allergies No family history on file. Family history: Family history reviewed and patient denies family history of carcinoma  Prior to Admission medications   Medication Sig Start Date End Date Taking? Authorizing Provider  benztropine (COGENTIN) 0.5 MG tablet Take 1 tablet (0.5 mg total) by mouth 2 (two) times daily. 06/08/19  Yes Money, Lowry Ram, FNP  cetirizine (ZYRTEC) 10 MG tablet Take 1 tablet (10 mg total) by mouth daily as needed for allergies. Patient taking differently: Take 10 mg by mouth daily.  09/13/15  Yes Hildred Priest, MD  divalproex (DEPAKOTE) 500 MG DR tablet Take 1 tablet (500 mg total) by mouth every 12  (twelve) hours. 06/08/19  Yes Money, Lowry Ram, FNP  haloperidol (HALDOL) 5 MG tablet Take 1 tablet (5 mg total) by mouth 3 (three) times daily. 06/08/19  Yes Money, Lowry Ram, FNP  haloperidol decanoate (HALDOL DECANOATE) 100 MG/ML injection Inject 100 mg into the muscle every 28 (twenty-eight) days.   Yes [provider]  levothyroxine (SYNTHROID) 88 MCG tablet Take 1 tablet (88 mcg total) by mouth daily at 6 (six) AM. 06/09/19  Yes Money, Lowry Ram, FNP  BANOPHEN 25 MG capsule Take 25 mg by mouth every 6 (six) hours as needed for itching. 01/26/20   [provider]  hydrOXYzine (ATARAX/VISTARIL) 25 MG tablet Take 25 mg by mouth 3 (three) times daily. 03/28/20   [provider]   Physical Exam: Vitals:   04/03/20 1522 04/03/20 1523  BP: (!) 143/52   Pulse: (!) 122   Resp: 17   Temp: 98.1 F (36.7 C)   TempSrc: Oral   SpO2: 98%   Weight:  63.5 kg  Height:  5\' 1"  (1.549 m)   Constitutional: appears age appropriate, NAD, calm, comfortable Eyes: PERRL, lids and conjunctivae normal ENMT: Mucous membranes are moist. Posterior pharynx clear of any exudate or lesions. Age-appropriate dentition. Hearing appropriate Neck: normal, supple, no masses, no thyromegaly Respiratory: clear to auscultation bilaterally, no wheezing, no crackles. Normal respiratory effort. No accessory muscle use.  Cardiovascular: Regular rate and rhythm, no murmurs / rubs / gallops. No extremity edema. 2+ pedal pulses. No carotid bruits.  Chest/Breast: Right breast and axilla was negative for masses, indurations, lesions.  Left breast was positive for increased size, warmth, erythema, and there is a mass at the 1 oclock position approximately 6 x 7 cm  Abdomen: no tenderness, no masses palpated, no hepatosplenomegaly. Bowel sounds positive.  Musculoskeletal: no clubbing / cyanosis. No joint deformity upper and lower extremities. Good ROM, no contractures, no atrophy. Normal muscle tone.  Skin: no rashes,  lesions, ulcers. No induration Neurologic: CN 2-12 grossly intact. Sensation intact. Strength 5/5 in all 4.  Psychiatric: Normal judgment and insight. Alert and oriented x 3. Normal mood.   EKG: Independently reviewed, showing sinus tachycardia with rate of 115, QTc 470  Imaging on Admission: Personally reviewed and I agree with radiologist reading as below.  US BREAST LTD UNI LEFT INC AXILLA  Result Date: 04/03/2020 CLINICAL DATA:  Patient presents with redness of the left breast and a large left breast mass. The patient was evaluated through the emergency department for evaluation of possible abscess. EXAM: ULTRASOUND OF THE LEFT BREAST COMPARISON:  None. FINDINGS: On physical exam, there is a large mass within the left breast. Targeted ultrasound is performed, showing a 6.3 x 6.2 x 7.1 cm lobular hypoechoic solid mass left breast 1 o'clock position (upper-outer quadrant). Multiple abnormal cortically thickened left axillary lymph nodes are demonstrated measuring up to 2.6 cm with effacement of the fatty hilum. IMPRESSION: 1. Large solid mass left breast 1 o'clock position concerning for malignancy. 2. Multiple abnormal cortically thickened left axillary lymph nodes concerning for metastatic adenopathy. RECOMMENDATION: 1. Recommend evaluation of the right and left breast with dedicated mammography and ultrasound at a dedicated breast center. This will allow for comprehensive evaluation given the concern of left breast malignancy. 2. Ultimately patient will need at least a biopsy of the large palpable left breast mass and 1 of the left  axillary lymph nodes. BI-RADS CATEGORY  5: Highly suggestive of malignancy. These results were called by telephone at the time of interpretation on 04/03/2020 at 4:33 pm to provider Dr. Charna Archer, who verbally acknowledged these results. Electronically Signed   By: Lovey Newcomer M.D.   On: 04/03/2020 16:51   Labs on Admission: I have personally reviewed following  labs  CBC: Recent Labs  Lab 04/03/20 1540  WBC 12.1*  NEUTROABS 9.2*  HGB 12.5  HCT 35.8*  MCV 87.5  PLT 004   Basic Metabolic Panel: Recent Labs  Lab 04/03/20 1540  NA 125*  K 3.1*  CL 88*  CO2 24  GLUCOSE 153*  BUN <5*  CREATININE 0.65  CALCIUM 9.5   GFR: Estimated Creatinine Clearance: 73.4 mL/min (by C-G formula based on SCr of 0.65 mg/dL).  Liver Function Tests: Recent Labs  Lab 04/03/20 1540  AST 25  ALT 13  ALKPHOS 68  BILITOT 0.4  PROT 8.6*  ALBUMIN 4.2   Urine analysis:    Component Value Date/Time   COLORURINE STRAW (A) 09/09/2015 2107   APPEARANCEUR CLEAR (A) 09/09/2015 2107   APPEARANCEUR Clear 11/11/2013 2142   LABSPEC 1.001 (L) 09/09/2015 2107   LABSPEC 1.001 11/11/2013 2142   PHURINE 7.0 09/09/2015 2107   GLUCOSEU NEGATIVE 09/09/2015 2107   GLUCOSEU Negative 11/11/2013 2142   HGBUR 2+ (A) 09/09/2015 2107   BILIRUBINUR NEGATIVE 09/09/2015 2107   BILIRUBINUR Negative 11/11/2013 2142   [place] NEGATIVE 09/09/2015 2107   PROTEINUR NEGATIVE 09/09/2015 2107   NITRITE NEGATIVE 09/09/2015 2107   LEUKOCYTESUR 1+ (A) 09/09/2015 2107   LEUKOCYTESUR 1+ 11/11/2013 2142   Eara Burruel N Reuel Lamadrid D.O. [facility]  If 12AM-7AM, please contact overnight-coverage provider If 7AM-7PM, please contact day coverage provider [email or web address]  04/03/2020, 7:24 PM

## 2020-04-03 NOTE — ED Notes (Signed)
Pt up and shuffling around room. Leaves room to use the bathroom or throw trash away, returns to room

## 2020-04-03 NOTE — ED Notes (Signed)
Pt given meal tray.

## 2020-04-03 NOTE — ED Notes (Signed)
INVOLUNTARY with all papers on chart/awaiting TTS/PSYCH consult ?

## 2020-04-03 NOTE — ED Notes (Signed)
INVOLUNTARY but will need medical admit for suspected breast cancer workup

## 2020-04-03 NOTE — ED Notes (Signed)
Report called, security called for transport, 1 bag of pt belongings sent with patient.

## 2020-04-03 NOTE — ED Provider Notes (Addendum)
[facility] Emergency Department Provider Note    First MD Initiated Contact with Patient 04/03/20 1507     (approximate)  I have reviewed the triage vital signs and the nursing notes.   HISTORY  Chief Complaint Psychiatric Evaluation  Level V Caveat:  Agitated psychosis  HPI Tamara [name] is a 49 y.o. female history of schizophrenia presents to the ER acutely agitated screaming. Unable to provide much additional history. Was reportedly combative with staff and had to be covered by police. Patient made emergent IVC due to concern for acute psychosis.     Past Medical History:  Diagnosis Date  . GERD (gastroesophageal reflux disease)   . IUD (intrauterine device) in place IUD was placed in 2016  . Leiomyoma of uterus   . Schizophrenia ([place])   . Thyroid disease    No family history on file. No past surgical history on file. Patient Active Problem List   Diagnosis Date Noted  . Schizophrenia ([place]) 06/06/2019  . 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 GERD (gastroesophageal reflux disease) 09/11/2015  . Seborrheic dermatitis 09/11/2015  . 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 Schizophrenia, paranoid type ([place]) 09/10/2015  . 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 Authorizing Provider  benztropine (COGENTIN) 0.5 MG tablet Take 1 tablet (0.5 mg total) by mouth 2 (two) times daily. 06/08/19   Money, Lowry Ram, FNP  cetirizine (ZYRTEC) 10 MG tablet Take 1 tablet (10 mg total) by mouth daily as needed for allergies. Patient taking differently: Take 10 mg by mouth daily.  09/13/15   Hildred Priest, MD  divalproex (DEPAKOTE) 500 MG DR tablet Take 1 tablet (500 mg total) by mouth every 12 (twelve) hours. 06/08/19   Money, Lowry Ram, FNP  haloperidol (HALDOL) 5 MG tablet Take 1 tablet (5 mg total) by mouth 3 (three) times daily. 06/08/19   Money, Lowry Ram, FNP  haloperidol decanoate (HALDOL DECANOATE) 100 MG/ML injection Inject 100 mg into the muscle every 28 (twenty-eight) days.    [provider]  levothyroxine (SYNTHROID) 88 MCG tablet Take 1 tablet (88 mcg total) by mouth daily at 6 (six) AM. 06/09/19   Money, Lowry Ram, FNP  LORazepam (ATIVAN) 1 MG tablet Take 1 tablet (1 mg total) by mouth every 6 (six) hours as needed for anxiety or sedation. 06/08/19   Money, Lowry Ram, FNP    Allergies Patient has no known allergies.    Social History Social History   Tobacco Use  . Smoking status: Never Smoker  . Smokeless tobacco: Never Used  Substance Use Topics  . Alcohol use: No  . Drug use: No    Review of Systems Patient denies headaches, rhinorrhea, blurry vision, numbness, shortness of breath, chest pain, edema, cough, abdominal pain, nausea, vomiting, diarrhea, dysuria, fevers, rashes or hallucinations unless otherwise stated above in HPI. ____________________________________________   PHYSICAL EXAM:  VITAL SIGNS: Vitals:   04/03/20 1522  BP: (!) 143/52  Pulse: (!) 122  Resp: 17  Temp: 98.1 F (36.7 C)  SpO2: 98%    Constitutional: Alert, acutely agitated, yelling at staff.  Eyes: Conjunctivae are normal.  Head: Atraumatic. Nose: No congestion/rhinnorhea. Mouth/Throat: Mucous membranes are moist.   Neck: No stridor. Painless ROM.  Cardiovascular:   regular rhythm. Grossly normal heart sounds.  Good peripheral circulation. Respiratory: Normal respiratory effort.  No retractions. Lungs CTAB. Gastrointestinal: Soft and nontender. No distention.  Genitourinary: deferred Musculoskeletal: No lower extremity tenderness nor edema.  No joint effusions. Neurologic:   No gross focal neurologic deficits are appreciated. No facial droop Skin:  Skin is warm, dry and intact. No rash noted. Psychiatric: acutely agitated with escalating behavior towards staff and nearby  patients  ____________________________________________   LABS (all labs ordered are listed, but only abnormal results are displayed)  No results found for this or any previous visit (from the past 24 hour(s)). ____________________________________________  ED ECG REPORT I, Merlyn Lot, the attending physician, personally viewed and interpreted this ECG.   Date: 04/03/2020  EKG Time: 15:32  Rate: 115  Rhythm:  sinus rhythm,  Axis: normal  Intervals:normal intervals  ST&T Change: no stemi  ____________________________________________  RADIOLOGY   ____________________________________________   PROCEDURES  Procedure(s) performed:  .Critical Care Performed by: Merlyn Lot, MD Authorized by: Merlyn Lot, MD   Critical care provider statement:    Critical care time (minutes):  35   Critical care time was exclusive of:  Separately billable procedures and treating other patients   Critical care was necessary to treat or prevent imminent or life-threatening deterioration of the following conditions:  CNS failure or compromise   Critical care was time spent personally by me on the following activities:  Development of treatment plan with patient or surrogate, discussions with consultants, evaluation of patient's response to treatment, examination of patient, obtaining history from patient or surrogate, ordering and performing treatments and interventions, ordering and review of laboratory studies, ordering and review of radiographic studies, pulse oximetry, re-evaluation of patient's condition and review of old charts      Critical Care performed: yes ____________________________________________   INITIAL IMPRESSION / [place] / ED COURSE  Pertinent labs & imaging results that were available during my care of the patient were reviewed by me and considered in my medical decision making (see chart for details).   DDX: Psychosis, delirium, medication  effect, noncompliance, polysubstance abuse, Si, Hi, depression   Tamara [name] is a 49 y.o. who presents to the ED with evidence of acute agitated psychosis with concern for being danger to herself and others. She was placed on her emergent IVC. Due to her combativeness with escalating behavior and concern for danger to herself she was given IM calming medication. Placed on monitor..  Patient has psych history of schizophrenia.. Patient will be signed out to oncoming physician pending evaluation of her medical work-up.  Clinical Course as of Apr 03 1548  Tue Apr 03, 2020  1547 Notified by RN that patient complaining of left breast pain.  On my exam with RN chaperone, the patient has very large tender mass roughly the size of a tennis ball behind and above the nipple with overlying warmth,  no discharge.  Will order [place].   [PR]    Clinical Course User Index [PR] Merlyn Lot, MD    The patient was evaluated in Emergency Department today for the symptoms described in the history of present illness. He/she was evaluated in the context of the global COVID-19 pandemic, which necessitated consideration that the patient might be at risk for infection with the SARS-CoV-2 virus that causes COVID-19. Institutional protocols and algorithms that pertain to the evaluation of patients at risk for COVID-19 are in a state of rapid change based on information released by regulatory bodies including the CDC and federal and state organizations. These policies and algorithms were followed during the patient's care in the ED.  As part of my medical  decision making, I reviewed the following data within the [place] notes reviewed and incorporated, Labs reviewed, notes from prior ED visits and [place] Controlled Substance Database   ____________________________________________   FINAL CLINICAL IMPRESSION(S) / ED DIAGNOSES  Final diagnoses:  Agitation  Breast pain in female       NEW MEDICATIONS STARTED DURING THIS VISIT:  New Prescriptions   No medications on file     Note:  This document was prepared using Dragon voice recognition software and may include unintentional dictation errors.    Merlyn Lot, MD 04/03/20 1534    Merlyn Lot, MD 04/03/20 1540    Merlyn Lot, MD 04/03/20 1549

## 2020-04-03 NOTE — ED Notes (Signed)
Attempted to call report, transferred twice, unable to reach receiving RN. Told to call back in 5-10 min

## 2020-04-03 NOTE — ED Provider Notes (Signed)
-----------------------------------------   3:55 PM on 04/03/2020 -----------------------------------------  Blood pressure (!) 143/52, pulse (!) 122, temperature 98.1 F (36.7 C), temperature source Oral, resp. rate 17, height 5\' 1"  (1.549 m), weight 63.5 kg, SpO2 98 %.  Assuming care from Dr. Quentin Cornwall.  In short, Tamara [name] is a 49 y.o. female with a chief complaint of Psychiatric Evaluation .  Refer to the original H&P for additional details.  The current plan of care is to follow-up US and lab work for potential breast abscess or malignancy, consider medical vs psych admission.  ----------------------------------------- 5:26 PM on 04/03/2020 -----------------------------------------  Ultrasound results discussed with radiology, who reports findings are most consistent with malignancy and associated metastatic adenopathy.  Radiology report unfortunately did not cross over to epic and is pasted below.  Plan to discuss with hospitalist for admission to address her new finding of lung cancer along with acute psychiatric illness.  CLINICAL DATA: Patient presents with redness of the left breast and a large left breast mass. The patient was evaluated through the emergency department for evaluation of possible abscess.  EXAM: ULTRASOUND OF THE LEFT BREAST  COMPARISON: None.  FINDINGS: On physical exam, there is a large mass within the left breast.  Targeted ultrasound is performed, showing a 6.3 x 6.2 x 7.1 cm lobular hypoechoic solid mass left breast 1 o'clock position (upper-outer quadrant). Multiple abnormal cortically thickened left axillary lymph nodes are demonstrated measuring up to 2.6 cm with effacement of the fatty hilum.  IMPRESSION: 1. Large solid mass left breast 1 o'clock position concerning for malignancy. 2. Multiple abnormal cortically thickened left axillary lymph nodes concerning for metastatic adenopathy.  RECOMMENDATION: 1. Recommend evaluation of the  right and left breast with dedicated mammography and ultrasound at a dedicated breast center. This will allow for comprehensive evaluation given the concern of left breast malignancy. 2. Ultimately patient will need at least a biopsy of the large palpable left breast mass and 1 of the left axillary lymph nodes.  BI-RADS CATEGORY 5: Highly suggestive of malignancy.  These results were called by telephone at the time of interpretation on 04/03/2020 at 4:33 pm to provider Dr. Charna Archer, who verbally acknowledged these results.   Electronically Signed By: Lovey Newcomer M.D. On: 04/03/2020 16:51    [name], MD 04/03/20 1727

## 2020-04-03 NOTE — ED Notes (Signed)
Pt noted walking back outside to the parking lot upset, other triage nurse Anderson Malta has gone to speak with the EDP for possible emergency IVC.

## 2020-04-03 NOTE — ED Notes (Signed)
While preforming EKG on pt, this tech noticed that pt left breast was swollen, hard and warm to the touch. Amy, RN also aware and is at pt bedside. MD Quentin Cornwall made aware and will examen pt.

## 2020-04-03 NOTE — ED Notes (Signed)
PT  PLACED  UNDER  IVC PAPERS  INFORMED  AMY  TEAGUE  RN  AND  HEATHER RN  CHARGE  NURSE

## 2020-04-03 NOTE — ED Triage Notes (Signed)
She arrives today via POV with her sister  Pt with manic, paranoid behavior upon arrival   EDP assessed pt while in triage and placed her under IVC  Pt left the building and was then brought back by family  First nurse and police assisted her from the vehicle  Pt placed in room 23

## 2020-04-04 ENCOUNTER — Encounter: Payer: Self-pay | Admitting: Internal Medicine

## 2020-04-04 ENCOUNTER — Inpatient Hospital Stay: Payer: Medicaid Other

## 2020-04-04 DIAGNOSIS — Z803 Family history of malignant neoplasm of breast: Secondary | ICD-10-CM | POA: Diagnosis not present

## 2020-04-04 DIAGNOSIS — R000 Tachycardia, unspecified: Secondary | ICD-10-CM | POA: Diagnosis present

## 2020-04-04 DIAGNOSIS — F70 Mild intellectual disabilities: Secondary | ICD-10-CM | POA: Diagnosis not present

## 2020-04-04 DIAGNOSIS — C779 Secondary and unspecified malignant neoplasm of lymph node, unspecified: Secondary | ICD-10-CM | POA: Diagnosis present

## 2020-04-04 DIAGNOSIS — F200 Paranoid schizophrenia: Secondary | ICD-10-CM | POA: Diagnosis not present

## 2020-04-04 DIAGNOSIS — Z975 Presence of (intrauterine) contraceptive device: Secondary | ICD-10-CM | POA: Diagnosis not present

## 2020-04-04 DIAGNOSIS — R6510 Systemic inflammatory response syndrome (SIRS) of non-infectious origin without acute organ dysfunction: Secondary | ICD-10-CM | POA: Diagnosis present

## 2020-04-04 DIAGNOSIS — R451 Restlessness and agitation: Secondary | ICD-10-CM | POA: Diagnosis present

## 2020-04-04 DIAGNOSIS — E079 Disorder of thyroid, unspecified: Secondary | ICD-10-CM | POA: Diagnosis present

## 2020-04-04 DIAGNOSIS — E031 Congenital hypothyroidism without goiter: Secondary | ICD-10-CM | POA: Diagnosis present

## 2020-04-04 DIAGNOSIS — I959 Hypotension, unspecified: Secondary | ICD-10-CM | POA: Diagnosis present

## 2020-04-04 DIAGNOSIS — C50212 Malignant neoplasm of upper-inner quadrant of left female breast: Secondary | ICD-10-CM | POA: Diagnosis present

## 2020-04-04 DIAGNOSIS — D649 Anemia, unspecified: Secondary | ICD-10-CM | POA: Diagnosis present

## 2020-04-04 DIAGNOSIS — Z20822 Contact with and (suspected) exposure to covid-19: Secondary | ICD-10-CM | POA: Diagnosis present

## 2020-04-04 DIAGNOSIS — N6321 Unspecified lump in the left breast, upper outer quadrant: Secondary | ICD-10-CM | POA: Diagnosis not present

## 2020-04-04 DIAGNOSIS — Z79890 Hormone replacement therapy (postmenopausal): Secondary | ICD-10-CM | POA: Diagnosis not present

## 2020-04-04 DIAGNOSIS — N644 Mastodynia: Secondary | ICD-10-CM | POA: Diagnosis present

## 2020-04-04 DIAGNOSIS — K219 Gastro-esophageal reflux disease without esophagitis: Secondary | ICD-10-CM | POA: Diagnosis present

## 2020-04-04 DIAGNOSIS — E871 Hypo-osmolality and hyponatremia: Secondary | ICD-10-CM | POA: Diagnosis present

## 2020-04-04 DIAGNOSIS — E876 Hypokalemia: Secondary | ICD-10-CM | POA: Diagnosis not present

## 2020-04-04 DIAGNOSIS — N63 Unspecified lump in unspecified breast: Secondary | ICD-10-CM | POA: Diagnosis not present

## 2020-04-04 DIAGNOSIS — Z79899 Other long term (current) drug therapy: Secondary | ICD-10-CM | POA: Diagnosis not present

## 2020-04-04 DIAGNOSIS — Z515 Encounter for palliative care: Secondary | ICD-10-CM | POA: Diagnosis not present

## 2020-04-04 LAB — VALPROIC ACID LEVEL: Valproic Acid Lvl: 93 ug/mL (ref 50.0–100.0)

## 2020-04-04 LAB — HIV ANTIBODY (ROUTINE TESTING W REFLEX): HIV Screen 4th Generation wRfx: NONREACTIVE

## 2020-04-04 MED ORDER — IOHEXOL 9 MG/ML PO SOLN
500.0000 mL | ORAL | Status: AC
  Administered 2020-04-04 (×2): 500 mL via ORAL

## 2020-04-04 MED ORDER — IOHEXOL 300 MG/ML  SOLN
100.0000 mL | Freq: Once | INTRAMUSCULAR | Status: AC | PRN
  Administered 2020-04-04: 100 mL via INTRAVENOUS

## 2020-04-04 NOTE — Consult Note (Signed)
[facility] Consult   Reason for Consult: Follow-up consult for this patient with chronic mental health and behavioral issues who has now been admitted to the medical service Referring Physician:  Pahwani Patient Identification: Tamara [name] MRN:  [id number] Principal Diagnosis: Mild intellectual disability Diagnosis:  Principal Problem:   Mild intellectual disability Active Problems:   Schizophrenia, paranoid type ([place])   Congenital hypothyroidism without goiter   Schizophrenia ([place])   Left breast mass   Total Time spent with patient: 1 hour  Subjective:   Tamara [name] is a 49 y.o. female patient admitted with "yall are in trouble".  HPI: Patient seen chart reviewed.  Patient had been seen yesterday in the emergency room as well.  This is a 49 year old woman well-known to the psychiatric service who has mild intellectual disability and a diagnosis of schizophrenia.  She had recently had a decompensation with increased agitation and poor sleep and poor functioning at home.  This is a cycle that the patient seems to go through from time to time for no clear reason.  She had been stable on medicine and there is no no new stressor.  While in the emergency room and examination by the emergency room staff revealed a mass in her left breast that was concerning and required work-up.  Work-up so far has suggested a likelihood of malignancy but requires further evaluation.  Patient was admitted to the medical service.  I spoke with her today and she was awake and alert.  Energetic.  Jumped up out of bed to try to shake hands with me.  Not aggressive or threatening but immediately wants to talk about her allegations that she had been injured by police on her way into the emergency room.  I apologized to her for that and inquired about the mass in her breast.  Patient is aware it exists but does not seem to have a understanding of the seriousness of the situation.  She says that it  was seen by her primary care doctor in the past who told her it was a rash from laundry detergent.  Patient is not reporting any current hallucinations.  Not threatening not aggressive no attempt to harm her self.  Past Psychiatric History: Past history of chronic mental illness with intermittent bouts of this kind of agitation.  Usually brief hospitalizations and continuing current outpatient medicines get her stabilized.  Risk to Self:   Risk to Others:   Prior Inpatient Therapy:   Prior Outpatient Therapy:    Past Medical History:  Past Medical History:  Diagnosis Date  . GERD (gastroesophageal reflux disease)   . IUD (intrauterine device) in place IUD was placed in 2016  . Leiomyoma of uterus   . Schizophrenia ([place])   . Thyroid disease    No past surgical history on file. Family History: No family history on file. Family Psychiatric  History: None reported Social History:  Social History   Substance and Sexual Activity  Alcohol Use No     Social History   Substance and Sexual Activity  Drug Use No    Social History   Socioeconomic History  . Marital status: Single    Spouse name: Not on file  . Number of children: Not on file  . Years of education: Not on file  . Highest education level: Not on file  Occupational History  . Not on file  Tobacco Use  . Smoking status: Never Smoker  . Smokeless tobacco: Never Used  Substance  and Sexual Activity  . Alcohol use: No  . Drug use: No  . Sexual activity: Yes    Birth control/protection: Surgical  Other Topics Concern  . Not on file  Social History Narrative  . Not on file   Social Determinants of Health   Financial Resource Strain:   . Difficulty of Paying Living Expenses: Not on file  Food Insecurity:   . Worried About Charity fundraiser in the Last Year: Not on file  . Ran Out of Food in the Last Year: Not on file  Transportation Needs:   . Lack of Transportation (Medical): Not on file  . Lack of  Transportation (Non-Medical): Not on file  Physical Activity:   . Days of Exercise per Week: Not on file  . Minutes of Exercise per Session: Not on file  Stress:   . Feeling of Stress : Not on file  Social Connections:   . Frequency of Communication with Friends and Family: Not on file  . Frequency of Social Gatherings with Friends and Family: Not on file  . Attends Religious Services: Not on file  . Active Member of Clubs or Organizations: Not on file  . Attends Archivist Meetings: Not on file  . Marital Status: Not on file   Additional Social History:    Allergies:  No Known Allergies  Labs:  Results for orders placed or performed during the hospital encounter of 04/03/20 (from the past 48 hour(s))  CBC with Differential     Status: Abnormal   Collection Time: 04/03/20  3:40 PM  Result Value Ref Range   WBC 12.1 (H) 4.0 - 10.5 K/uL   RBC 4.09 3.87 - 5.11 MIL/uL   Hemoglobin 12.5 12.0 - 15.0 g/dL   HCT 35.8 (L) 36 - 46 %   MCV 87.5 80.0 - 100.0 fL   MCH 30.6 26.0 - 34.0 pg   MCHC 34.9 30.0 - 36.0 g/dL   RDW 12.6 11.5 - 15.5 %   Platelets 342 150 - 400 K/uL   nRBC 0.0 0.0 - 0.2 %   Neutrophils Relative % 77 %   Neutro Abs 9.2 (H) 1.7 - 7.7 K/uL   Lymphocytes Relative 12 %   Lymphs Abs 1.5 0.7 - 4.0 K/uL   Monocytes Relative 10 %   Monocytes Absolute 1.3 (H) 0.1 - 1.0 K/uL   Eosinophils Relative 0 %   Eosinophils Absolute 0.0 0.0 - 0.5 K/uL   Basophils Relative 0 %   Basophils Absolute 0.0 0.0 - 0.1 K/uL   Immature Granulocytes 1 %   Abs Immature Granulocytes 0.17 (H) 0.00 - 0.07 K/uL    Comment: Performed at [facility], [address] [place], [place] [address]  Comprehensive metabolic panel     Status: Abnormal   Collection Time: 04/03/20  3:40 PM  Result Value Ref Range   Sodium 125 (L) 135 - 145 mmol/L   Potassium 3.1 (L) 3.5 - 5.1 mmol/L   Chloride 88 (L) 98 - 111 mmol/L   CO2 24 22 - 32 mmol/L   Glucose, Bld 153 (H) 70 - 99 mg/dL     Comment: Glucose reference range applies only to samples taken after fasting for at least 8 hours.   BUN <5 (L) 6 - 20 mg/dL   Creatinine, Ser 0.65 0.44 - 1.00 mg/dL   Calcium 9.5 8.9 - 10.3 mg/dL   Total Protein 8.6 (H) 6.5 - 8.1 g/dL   Albumin 4.2 3.5 - 5.0  g/dL   AST 25 15 - 41 U/L   ALT 13 0 - 44 U/L   Alkaline Phosphatase 68 38 - 126 U/L   Total Bilirubin 0.4 0.3 - 1.2 mg/dL   GFR, Estimated >60 >60 mL/min    Comment: (NOTE) Calculated using the CKD-EPI Creatinine Equation (2021)    Anion gap 13 5 - 15    Comment: Performed at [facility], [address] [place], [place] [address]  Ethanol     Status: None   Collection Time: 04/03/20  3:40 PM  Result Value Ref Range   Alcohol, Ethyl (B) <10 <10 mg/dL    Comment: (NOTE) Lowest detectable limit for serum alcohol is 10 mg/dL.  For medical purposes only. Performed at [facility], [address] [place], [place] [address]   Respiratory Panel by RT PCR (Flu A&B, Covid) - Nasopharyngeal Swab     Status: None   Collection Time: 04/03/20  3:43 PM   Specimen: Nasopharyngeal Swab  Result Value Ref Range   SARS Coronavirus 2 by RT PCR NEGATIVE NEGATIVE    Comment: (NOTE) SARS-CoV-2 target nucleic acids are NOT DETECTED.  The SARS-CoV-2 RNA is generally detectable in upper respiratoy specimens during the acute phase of infection. The lowest concentration of SARS-CoV-2 viral copies this assay can detect is 131 copies/mL. A negative result does not preclude SARS-Cov-2 infection and should not be used as the sole basis for treatment or other patient management decisions. A negative result may occur with  improper specimen collection/handling, submission of specimen other than nasopharyngeal swab, presence of viral mutation(s) within the areas targeted by this assay, and inadequate number of viral copies (<131 copies/mL). A negative result must be combined with clinical observations, patient history, and  epidemiological information. The expected result is Negative.  Fact Sheet for Patients:  PinkCheek.be  Fact Sheet for Healthcare Providers:  GravelBags.it  This test is no t yet approved or cleared by the [place] FDA and  has been authorized for detection and/or diagnosis of SARS-CoV-2 by FDA under an Emergency Use Authorization (EUA). This EUA will remain  in effect (meaning this test can be used) for the duration of the COVID-19 declaration under Section 564(b)(1) of the Act, 21 U.S.C. section 360bbb-3(b)(1), unless the authorization is terminated or revoked sooner.     Influenza A by PCR NEGATIVE NEGATIVE   Influenza B by PCR NEGATIVE NEGATIVE    Comment: (NOTE) The Xpert Xpress SARS-CoV-2/FLU/RSV assay is intended as an aid in  the diagnosis of influenza from Nasopharyngeal swab specimens and  should not be used as a sole basis for treatment. Nasal washings and  aspirates are unacceptable for Xpert Xpress SARS-CoV-2/FLU/RSV  testing.  Fact Sheet for Patients: PinkCheek.be  Fact Sheet for Healthcare Providers: GravelBags.it  This test is not yet approved or cleared by the [place] FDA and  has been authorized for detection and/or diagnosis of SARS-CoV-2 by  FDA under an Emergency Use Authorization (EUA). This EUA will remain  in effect (meaning this test can be used) for the duration of the  Covid-19 declaration under Section 564(b)(1) of the Act, 21  U.S.C. section 360bbb-3(b)(1), unless the authorization is  terminated or revoked. Performed at [facility], [address] [place], [place] [address]   Valproic acid level     Status: None   Collection Time: 04/04/20  4:02 AM  Result Value Ref Range   Valproic Acid Lvl 93 50.0 - 100.0 ug/mL  Comment: Performed at [facility], [address] [place], [place] [address]    HIV Antibody (routine testing w rflx)     Status: None   Collection Time: 04/04/20  4:02 AM  Result Value Ref Range   HIV Screen 4th Generation wRfx Non Reactive Non Reactive    Comment: Performed at [facility], [address] [place], [place] [address]    Current Facility-Administered Medications  Medication Dose Route Frequency Provider Last Rate Last Admin  . benztropine (COGENTIN) tablet 0.5 mg  0.5 mg Oral BID Cox, Amy N, DO   0.5 mg at 04/04/20 0920  . diphenhydrAMINE (BENADRYL) capsule 25 mg  25 mg Oral Q6H PRN Cox, Amy N, DO      . divalproex (DEPAKOTE) DR tablet 500 mg  500 mg Oral Q12H Cox, Amy N, DO   500 mg at 04/04/20 0920  . enoxaparin (LOVENOX) injection 40 mg  40 mg Subcutaneous Q24H Cox, Amy N, DO      . haloperidol (HALDOL) tablet 5 mg  5 mg Oral TID Cox, Amy N, DO   5 mg at 04/04/20 0920  . hydrOXYzine (ATARAX/VISTARIL) tablet 25 mg  25 mg Oral TID Cox, Amy N, DO   25 mg at 04/04/20 0920  . levothyroxine (SYNTHROID) tablet 88 mcg  88 mcg Oral Q0600 Cox, Amy N, DO      . loratadine (CLARITIN) tablet 10 mg  10 mg Oral Daily Cox, Amy N, DO   10 mg at 04/04/20 0920    Musculoskeletal: Strength & Muscle Tone: within normal limits Gait & Station: normal Patient leans: N/A  Psychiatric Specialty Exam: Physical Exam Vitals and nursing note reviewed.  Constitutional:      Appearance: She is well-developed.  HENT:     Head: Normocephalic and atraumatic.  Eyes:     Conjunctiva/sclera: Conjunctivae normal.     Pupils: Pupils are equal, round, and reactive to light.  Cardiovascular:     Heart sounds: Normal heart sounds.  Pulmonary:     Effort: Pulmonary effort is normal.  Abdominal:     Palpations: Abdomen is soft.  Musculoskeletal:        General: Normal range of motion.     Cervical back: Normal range of motion.  Skin:    General: Skin is warm and dry.       Neurological:     General: No focal deficit present.     Mental Status: She is alert.   Psychiatric:        Attention and Perception: Attention normal.        Mood and Affect: Mood is elated.        Speech: Speech is tangential.        Behavior: Behavior is agitated. Behavior is not aggressive.        Thought Content: Thought content does not include homicidal or suicidal ideation.        Cognition and Memory: Cognition is impaired.        Judgment: Judgment is impulsive.     Review of Systems  Constitutional: Negative.   HENT: Negative.   Eyes: Negative.   Respiratory: Negative.   Cardiovascular: Negative.   Gastrointestinal: Negative.   Musculoskeletal: Negative.   Skin: Negative.   Neurological: Negative.   Psychiatric/Behavioral: Negative.     Blood pressure 99/66, pulse 93, temperature 98 F (36.7 C), temperature source Oral, resp. rate 16, height 5\' 1"  (1.549 m), weight 63.5 kg, SpO2 99 %.Body mass  index is 26.45 kg/m.  General Appearance: Casual  Eye Contact:  Good  Speech:  Pressured  Volume:  Increased  Mood:  Euthymic  Affect:  Congruent  Thought Process:  Coherent  Orientation:  Full (Time, Place, and Person)  Thought Content:  Rumination and Tangential  Suicidal Thoughts:  No  Homicidal Thoughts:  No  Memory:  Immediate;   Fair Recent;   Poor Remote;   Poor  Judgement:  Impaired  Insight:  Shallow  Psychomotor Activity:  Increased  Concentration:  Concentration: Poor  Recall:  [place] of Knowledge:  Poor  Language:  Fair  Akathisia:  No  Handed:  Right  AIMS (if indicated):     Assets:  Desire for Improvement Financial Resources/Insurance Housing Resilience Social Support  ADL's:  Impaired  Cognition:  Impaired,  Mild  Sleep:        Treatment Plan Summary: Daily contact with patient to assess and evaluate symptoms and progress in treatment, Medication management and Plan Patient with a chronic mental health condition.  Today she seems improved over how it was alleged she was doing at home.  She still a little bit hyperactive  but not too far off her baseline.  Depakote level done in the emergency room yesterday was 95 suggesting that she had been compliant with medicine.  Usual plan which I have followed this time is to continue her outpatient Depakote Haldol and Cogentin doses.  She may need a Haldol decanoate shot as well I am not certain when she last got it.  Patient has a legal guardian which renders any concerns about "capacity" moved.  Patient cannot make decisions about her own treatment.  Guardian needs to be contacted for every step of treatment and procedure.  I believe the legal guardian is the patient's mother although the patient's sister is often act as intermediary's for communication as well.  Offered reassurance to the patient.  I did not go into detail about the medical situation as I did not think she would understand that it would just upset her.  Disposition: It is entirely possible that she will not need psychiatric admission by the time medical treatment is done but I will continue to follow up day to day and if it turns out that she no longer needs medical hospitalization and would benefit from inpatient treatment we can switch to that.  She should remain under IVC for the time being for safety which for better worse means the sitter will need to stay in place.  No change to orders for now.  Alethia Berthold, MD 04/04/2020 12:23 PM

## 2020-04-04 NOTE — Progress Notes (Signed)
PROGRESS NOTE    Tamara [name]  HDQ:[id number] DOB: 09-04-1970 DOA: 04/03/2020 PCP: [facility]   Brief Narrative:  Tamara [name] is a 49 y.o. female with medical history significant for hypothyroid congenital, mild intellectual disability, schizophrenia, presented to the emergency department acutely and agitated with screaming. Patient was emergently IVC due to concern for acute psychosis.  While EKG leads were being place, left breast mass was discovered. Ultrasound showed mass concerning for malignancy.  Patient admitted for further inpatient work-up of left breast mass.   Assessment & Plan:   Left breast mass: Concerning for malignancy -ultrasound of left breast shows large solid mass at 1 o'clock position concerning for malignancy.  Recommended mammogram.   -Mammogram is ordered and is pending. -General surgery has been consulted-await recommendations -Consulted oncology-recommended CT chest/abdomen/pelvis for staging.   Congenital hypothyroidism-resumed levothyroxine  Schizophrenia - 1:1 observation ordered -Continued home meds-Depakote, Haldol, hydroxyzine. -Appreciate psych input.  Hyponatremia: Chronic -patient is asymptomatic.  Check urine sodium.  Repeat BMP tomorrow a.m.  Leukocytosis: WBC of 12.1.  Patient is afebrile.  Likely in the setting of underlying malignancy?.  Repeat CBC tomorrow AM.  Hypokalemia: Replenished.  Will repeat BMP tomorrow a.m.  Check magnesium level.  Mild intellectual disability-appears at baseline  DVT prophylaxis: Lovenox Code Status: Full code Family Communication:  None present at bedside.  Plan of care discussed with patient in length and she verbalized understanding and agreed with it. Disposition Plan: Pending general surgery evaluation  Consultants:  General surgery Oncology  Procedures:   Breast ultrasound  mammogram  Antimicrobials:   None  Status is: Observation   Dispo: The patient is  from: Home              Anticipated d/c is to: Inpatient psych?              Anticipated d/c date is: 04/07/2020              Patient currently not medically stable for the discharge.    Subjective: Patient seen and examined.  Sitting comfortably on the bed.  Eating breakfast.  Denies any pain in left breast.  Denies nipple discharge.  She has no complaints. Objective: Vitals:   04/03/20 2328 04/04/20 0348 04/04/20 0900 04/04/20 1104  BP: (!) 117/98 119/72 (!) 134/113 99/66  Pulse: 97 90 98 93  Resp: 18 18 18 16   Temp:  98 F (36.7 C)    TempSrc:  Oral    SpO2: 100% 100% 100% 99%  Weight:      Height:        Intake/Output Summary (Last 24 hours) at 04/04/2020 1428 Last data filed at 04/04/2020 0327 Gross per 24 hour  Intake 0 ml  Output --  Net 0 ml   Filed Weights   04/03/20 1523  Weight: 63.5 kg    Examination:  General exam: Appears calm and comfortable  Respiratory system: Clear to auscultation. Respiratory effort normal. Cardiovascular system: S1 & S2 heard, RRR. No JVD, murmurs, rubs, gallops or clicks. No pedal edema. Gastrointestinal system: Abdomen is nondistended, soft and nontender. No organomegaly or masses felt. Normal bowel sounds heard. Central nervous system: Alert and oriented. No focal neurological deficits. Extremities: Symmetric 5 x 5 power. Skin: Left breast: About 6 x 7 cm in size form mass noted in left breast.  Warm to touch, mildly erythematous, nipple everted.  No nipple discharge seen. Psychiatry: Judgement and insight appear normal. Mood & affect appropriate.    Data  Reviewed: I have personally reviewed following labs and imaging studies  CBC: Recent Labs  Lab 04/03/20 1540  WBC 12.1*  NEUTROABS 9.2*  HGB 12.5  HCT 35.8*  MCV 87.5  PLT 557   Basic Metabolic Panel: Recent Labs  Lab 04/03/20 1540  NA 125*  K 3.1*  CL 88*  CO2 24  GLUCOSE 153*  BUN <5*  CREATININE 0.65  CALCIUM 9.5   GFR: Estimated Creatinine  Clearance: 73.4 mL/min (by C-G formula based on SCr of 0.65 mg/dL). Liver Function Tests: Recent Labs  Lab 04/03/20 1540  AST 25  ALT 13  ALKPHOS 68  BILITOT 0.4  PROT 8.6*  ALBUMIN 4.2   No results for input(s): LIPASE, AMYLASE in the last 168 hours. No results for input(s): AMMONIA in the last 168 hours. Coagulation Profile: No results for input(s): INR, PROTIME in the last 168 hours. Cardiac Enzymes: No results for input(s): CKTOTAL, CKMB, CKMBINDEX, TROPONINI in the last 168 hours. BNP (last 3 results) No results for input(s): PROBNP in the last 8760 hours. HbA1C: No results for input(s): HGBA1C in the last 72 hours. CBG: No results for input(s): GLUCAP in the last 168 hours. Lipid Profile: No results for input(s): CHOL, HDL, LDLCALC, TRIG, CHOLHDL, LDLDIRECT in the last 72 hours. Thyroid Function Tests: No results for input(s): TSH, T4TOTAL, FREET4, T3FREE, THYROIDAB in the last 72 hours. Anemia Panel: No results for input(s): VITAMINB12, FOLATE, FERRITIN, TIBC, IRON, RETICCTPCT in the last 72 hours. Sepsis Labs: No results for input(s): PROCALCITON, LATICACIDVEN in the last 168 hours.  Recent Results (from the past 240 hour(s))  Respiratory Panel by RT PCR (Flu A&B, Covid) - Nasopharyngeal Swab     Status: None   Collection Time: 04/03/20  3:43 PM   Specimen: Nasopharyngeal Swab  Result Value Ref Range Status   SARS Coronavirus 2 by RT PCR NEGATIVE NEGATIVE Final    Comment: (NOTE) SARS-CoV-2 target nucleic acids are NOT DETECTED.  The SARS-CoV-2 RNA is generally detectable in upper respiratoy specimens during the acute phase of infection. The lowest concentration of SARS-CoV-2 viral copies this assay can detect is 131 copies/mL. A negative result does not preclude SARS-Cov-2 infection and should not be used as the sole basis for treatment or other patient management decisions. A negative result may occur with  improper specimen collection/handling, submission of  specimen other than nasopharyngeal swab, presence of viral mutation(s) within the areas targeted by this assay, and inadequate number of viral copies (<131 copies/mL). A negative result must be combined with clinical observations, patient history, and epidemiological information. The expected result is Negative.  Fact Sheet for Patients:  PinkCheek.be  Fact Sheet for Healthcare Providers:  GravelBags.it  This test is no t yet approved or cleared by the [place] FDA and  has been authorized for detection and/or diagnosis of SARS-CoV-2 by FDA under an Emergency Use Authorization (EUA). This EUA will remain  in effect (meaning this test can be used) for the duration of the COVID-19 declaration under Section 564(b)(1) of the Act, 21 U.S.C. section 360bbb-3(b)(1), unless the authorization is terminated or revoked sooner.     Influenza A by PCR NEGATIVE NEGATIVE Final   Influenza B by PCR NEGATIVE NEGATIVE Final    Comment: (NOTE) The Xpert Xpress SARS-CoV-2/FLU/RSV assay is intended as an aid in  the diagnosis of influenza from Nasopharyngeal swab specimens and  should not be used as a sole basis for treatment. Nasal washings and  aspirates are unacceptable for Xpert Xpress  SARS-CoV-2/FLU/RSV  testing.  Fact Sheet for Patients: PinkCheek.be  Fact Sheet for Healthcare Providers: GravelBags.it  This test is not yet approved or cleared by the [place] FDA and  has been authorized for detection and/or diagnosis of SARS-CoV-2 by  FDA under an Emergency Use Authorization (EUA). This EUA will remain  in effect (meaning this test can be used) for the duration of the  Covid-19 declaration under Section 564(b)(1) of the Act, 21  U.S.C. section 360bbb-3(b)(1), unless the authorization is  terminated or revoked. Performed at [facility], [address] [place], [place] [address]       Radiology Studies: US BREAST LTD UNI LEFT INC AXILLA  Result Date: 04/03/2020 CLINICAL DATA:  Patient presents with redness of the left breast and a large left breast mass. The patient was evaluated through the emergency department for evaluation of possible abscess. EXAM: ULTRASOUND OF THE LEFT BREAST COMPARISON:  None. FINDINGS: On physical exam, there is a large mass within the left breast. Targeted ultrasound is performed, showing a 6.3 x 6.2 x 7.1 cm lobular hypoechoic solid mass left breast 1 o'clock position (upper-outer quadrant). Multiple abnormal cortically thickened left axillary lymph nodes are demonstrated measuring up to 2.6 cm with effacement of the fatty hilum. IMPRESSION: 1. Large solid mass left breast 1 o'clock position concerning for malignancy. 2. Multiple abnormal cortically thickened left axillary lymph nodes concerning for metastatic adenopathy. RECOMMENDATION: 1. Recommend evaluation of the right and left breast with dedicated mammography and ultrasound at a dedicated breast center. This will allow for comprehensive evaluation given the concern of left breast malignancy. 2. Ultimately patient will need at least a biopsy of the large palpable left breast mass and 1 of the left axillary lymph nodes. BI-RADS CATEGORY  5: Highly suggestive of malignancy. These results were called by telephone at the time of interpretation on 04/03/2020 at 4:33 pm to provider Dr. Charna Archer, who verbally acknowledged these results. Electronically Signed   By: Lovey Newcomer M.D.   On: 04/03/2020 16:51    Scheduled Meds: . benztropine  0.5 mg Oral BID  . divalproex  500 mg Oral Q12H  . enoxaparin (LOVENOX) injection  40 mg Subcutaneous Q24H  . haloperidol  5 mg Oral TID  . hydrOXYzine  25 mg Oral TID  . levothyroxine  88 mcg Oral Q0600  . loratadine  10 mg Oral Daily   Continuous Infusions:   LOS: 0 days   Time spent: 40 minutes   Yusif Gnau Loann Quill, MD [facility]  If 7PM-7AM, please contact night-coverage [email or web address] 04/04/2020, 2:28 PM

## 2020-04-04 NOTE — Consult Note (Signed)
SURGICAL CONSULTATION NOTE   HISTORY OF PRESENT ILLNESS (HPI):  49 y.o. female presented to [facility] ED for evaluation of agitation. Patient with history of schizophrenia and unable to give coherent history or any complain. Patient was going to be admitted by psychiatry but when EKG was going to be done, a large left breast palpable mass was identified. The patient was then admitted by Hospitalist for evaluation and management of left breast mass.   As per chart review, the patient is not agitated any more. Patient reports having that mass for years. She reported that is not painful and that she wanted to keep it. Denies any pain radiation. Denies any alleviating or aggravating factors.   A breast ultrasound was done showing a 7 cm density centimeter left breast mass.  There were also abnormal lymph node identified. I personally evaluated the images.   Surgery is consulted by Dr. Doristine Bosworth in this context for evaluation and management of left breast mass.  PAST MEDICAL HISTORY (PMH):  Past Medical History:  Diagnosis Date  . GERD (gastroesophageal reflux disease)   . IUD (intrauterine device) in place IUD was placed in 2016  . Leiomyoma of uterus   . Schizophrenia ([place])   . Thyroid disease      PAST SURGICAL HISTORY ([place]):  No past surgical history on file.   MEDICATIONS:  Prior to Admission medications   Medication Sig Start Date End Date Taking? Authorizing Provider  benztropine (COGENTIN) 0.5 MG tablet Take 1 tablet (0.5 mg total) by mouth 2 (two) times daily. 06/08/19  Yes Money, Lowry Ram, FNP  cetirizine (ZYRTEC) 10 MG tablet Take 1 tablet (10 mg total) by mouth daily as needed for allergies. Patient taking differently: Take 10 mg by mouth daily.  09/13/15  Yes Hildred Priest, MD  divalproex (DEPAKOTE) 500 MG DR tablet Take 1 tablet (500 mg total) by mouth every 12 (twelve) hours. 06/08/19  Yes Money, Lowry Ram, FNP  haloperidol (HALDOL) 5 MG tablet Take 1 tablet (5 mg total)  by mouth 3 (three) times daily. 06/08/19  Yes Money, Lowry Ram, FNP  haloperidol decanoate (HALDOL DECANOATE) 100 MG/ML injection Inject 100 mg into the muscle every 28 (twenty-eight) days.   Yes [provider]  levothyroxine (SYNTHROID) 88 MCG tablet Take 1 tablet (88 mcg total) by mouth daily at 6 (six) AM. 06/09/19  Yes Money, Lowry Ram, FNP  BANOPHEN 25 MG capsule Take 25 mg by mouth every 6 (six) hours as needed for itching. 01/26/20   [provider]  hydrOXYzine (ATARAX/VISTARIL) 25 MG tablet Take 25 mg by mouth 3 (three) times daily. 03/28/20   [provider]     ALLERGIES:  No Known Allergies   SOCIAL HISTORY:  Social History   Socioeconomic History  . Marital status: Single    Spouse name: Not on file  . Number of children: Not on file  . Years of education: Not on file  . Highest education level: Not on file  Occupational History  . Not on file  Tobacco Use  . Smoking status: Never Smoker  . Smokeless tobacco: Never Used  Substance and Sexual Activity  . Alcohol use: No  . Drug use: No  . Sexual activity: Yes    Birth control/protection: Surgical  Other Topics Concern  . Not on file  Social History Narrative  . Not on file   Social Determinants of Health   Financial Resource Strain:   . Difficulty of Paying Living Expenses: Not on file  Food Insecurity:   . Worried About Charity fundraiser in the Last Year: Not on file  . Ran Out of Food in the Last Year: Not on file  Transportation Needs:   . Lack of Transportation (Medical): Not on file  . Lack of Transportation (Non-Medical): Not on file  Physical Activity:   . Days of Exercise per Week: Not on file  . Minutes of Exercise per Session: Not on file  Stress:   . Feeling of Stress : Not on file  Social Connections:   . Frequency of Communication with Friends and Family: Not on file  . Frequency of Social Gatherings with Friends and Family: Not on file  . Attends Religious Services:  Not on file  . Active Member of Clubs or Organizations: Not on file  . Attends Archivist Meetings: Not on file  . Marital Status: Not on file  Intimate Partner Violence:   . Fear of Current or Ex-Partner: Not on file  . Emotionally Abused: Not on file  . Physically Abused: Not on file  . Sexually Abused: Not on file      FAMILY HISTORY:  No family history on file.   REVIEW OF SYSTEMS:  Constitutional: denies weight loss, fever, chills, or sweats  Eyes: denies any other vision changes, history of eye injury  ENT: denies sore throat, hearing problems  Respiratory: denies shortness of breath, wheezing  Cardiovascular: denies chest pain, palpitations  Gastrointestinal: abdominal pain, nausea and vomiting Genitourinary: denies burning with urination or urinary frequency Musculoskeletal: denies any other joint pains or cramps  Skin: denies any other rashes or skin discolorations  Neurological: denies any other headache, dizziness, weakness  Psychiatric: positive for agitation    All other review of systems were negative   VITAL SIGNS:  Temp:  [97.7 F (36.5 C)-98.8 F (37.1 C)] 98 F (36.7 C) (11/10 0348) Pulse Rate:  [90-112] 93 (11/10 1104) Resp:  [16-18] 16 (11/10 1104) BP: (99-147)/(66-113) 99/66 (11/10 1104) SpO2:  [99 %-100 %] 99 % (11/10 1104)     Height: 5\' 1"  (154.9 cm) Weight: 63.5 kg BMI (Calculated): 26.47   INTAKE/OUTPUT:  This shift: No intake/output data recorded.  Last 2 shifts: @IOLAST2SHIFTS @   PHYSICAL EXAM:  Constitutional:  -- Normal body habitus  -- Awake, alert, and oriented x3  Eyes:  -- Pupils equally round and reactive to light  -- No scleral icterus  Ear, nose, and throat:  -- No jugular venous distension  Pulmonary:  -- No crackles  -- Equal breath sounds bilaterally -- Breathing non-labored at rest Cardiovascular:  -- S1, S2 present  -- No pericardial rubs Breast: -large palpable mass of the left breast. Patient did not  cooperated for palpation of the left axillary area.  Gastrointestinal:  -- Abdomen soft, nontender, non-distended, no guarding or rebound tenderness -- No abdominal masses appreciated, pulsatile or otherwise  Musculoskeletal and Integumentary:  -- Wounds: None appreciated -- Extremities: B/L UE and LE FROM, hands and feet warm, no edema  Neurologic:  -- Motor function: intact and symmetric -- Sensation: intact and symmetric   Labs:  CBC Latest Ref Rng & Units 04/03/2020 06/07/2019 06/05/2019  WBC 4.0 - 10.5 K/uL 12.1(H) 7.1 8.5  Hemoglobin 12.0 - 15.0 g/dL 12.5 12.1 12.8  Hematocrit 36 - 46 % 35.8(L) 35.8(L) 36.6  Platelets 150 - 400 K/uL 342 170 239   CMP Latest Ref Rng & Units 04/03/2020 06/07/2019 06/05/2019  Glucose 70 - 99 mg/dL 153(H) 97 167(H)  BUN 6 - 20 mg/dL <5(L) 8 <5(L)  Creatinine 0.44 - 1.00 mg/dL 0.65 0.76 0.51  Sodium 135 - 145 mmol/L 125(L) 135 132(L)  Potassium 3.5 - 5.1 mmol/L 3.1(L) 4.4 3.5  Chloride 98 - 111 mmol/L 88(L) 98 101  CO2 22 - 32 mmol/L 24 26 24   Calcium 8.9 - 10.3 mg/dL 9.5 10.1 8.1(L)  Total Protein 6.5 - 8.1 g/dL 8.6(H) 7.6 -  Total Bilirubin 0.3 - 1.2 mg/dL 0.4 0.4 -  Alkaline Phos 38 - 126 U/L 68 66 -  AST 15 - 41 U/L 25 15 -  ALT 0 - 44 U/L 13 8 -    Imaging studies:  EXAM: ULTRASOUND OF THE LEFT BREAST  COMPARISON:  None.  FINDINGS: On physical exam, there is a large mass within the left breast.  Targeted ultrasound is performed, showing a 6.3 x 6.2 x 7.1 cm lobular hypoechoic solid mass left breast 1 o'clock position (upper-outer quadrant). Multiple abnormal cortically thickened left axillary lymph nodes are demonstrated measuring up to 2.6 cm with effacement of the fatty hilum.  IMPRESSION: 1. Large solid mass left breast 1 o'clock position concerning for malignancy. 2. Multiple abnormal cortically thickened left axillary lymph nodes concerning for metastatic adenopathy.  RECOMMENDATION: 1. Recommend evaluation of the  right and left breast with dedicated mammography and ultrasound at a dedicated breast center. This will allow for comprehensive evaluation given the concern of left breast malignancy. 2. Ultimately patient will need at least a biopsy of the large palpable left breast mass and 1 of the left axillary lymph nodes.  BI-RADS CATEGORY  5: Highly suggestive of malignancy.  These results were called by telephone at the time of interpretation on 04/03/2020 at 4:33 pm to provider Dr. Charna Archer, who verbally acknowledged these results.   Electronically Signed   By: Lovey Newcomer M.D.   On: 04/03/2020 16:51  Assessment/Plan:  49 y.o. female with large left breast mass, complicated by pertinent comorbidities including schizophrenia.  Patient with large left breast mass and abnormal axillary lymph node are suspicious of left breast cancer with metastases to axillary lymph node until proven otherwise. Without complete workup having a 7 cm mass with multiple axillary lymph nodes put this patient on at least a stage IIIa.  I agree with bilateral diagnostic mammogram, core needle biopsy of the breast mass and the axillary lymph nodes and Medical Oncology evaluation for decision regarding the possible need of chemotherapy. With this findings, if surgery is decided as the only treatment patient will need a modified radical mastectomy which is a very difficult surgery and recovery that I am not sure if this patient will be able to deal with the wound, drains, pain and recovery. Will need to discuss with family when complete workup is done.   Arnold Long, MD

## 2020-04-05 DIAGNOSIS — Z515 Encounter for palliative care: Secondary | ICD-10-CM | POA: Diagnosis not present

## 2020-04-05 DIAGNOSIS — F70 Mild intellectual disabilities: Secondary | ICD-10-CM | POA: Diagnosis not present

## 2020-04-05 LAB — CBC
HCT: 32.4 % — ABNORMAL LOW (ref 36.0–46.0)
Hemoglobin: 11.3 g/dL — ABNORMAL LOW (ref 12.0–15.0)
MCH: 31.1 pg (ref 26.0–34.0)
MCHC: 34.9 g/dL (ref 30.0–36.0)
MCV: 89.3 fL (ref 80.0–100.0)
Platelets: 297 10*3/uL (ref 150–400)
RBC: 3.63 MIL/uL — ABNORMAL LOW (ref 3.87–5.11)
RDW: 13.2 % (ref 11.5–15.5)
WBC: 7.6 10*3/uL (ref 4.0–10.5)
nRBC: 0 % (ref 0.0–0.2)

## 2020-04-05 LAB — BASIC METABOLIC PANEL
Anion gap: 8 (ref 5–15)
BUN: <5 mg/dL — ABNORMAL LOW (ref 6–20)
CO2: 26 mmol/L (ref 22–32)
Calcium: 9.1 mg/dL (ref 8.9–10.3)
Chloride: 97 mmol/L — ABNORMAL LOW (ref 98–111)
Creatinine, Ser: 0.48 mg/dL (ref 0.44–1.00)
GFR, Estimated: >60 mL/min (ref >60)
Glucose, Bld: 88 mg/dL (ref 70–99)
Potassium: 3.9 mmol/L (ref 3.5–5.1)
Sodium: 131 mmol/L — ABNORMAL LOW (ref 135–145)

## 2020-04-05 LAB — MAGNESIUM: Magnesium: 2.1 mg/dL (ref 1.7–2.4)

## 2020-04-05 LAB — SODIUM, URINE, RANDOM: Sodium, Ur: 89 mmol/L

## 2020-04-05 MED ORDER — ZIPRASIDONE MESYLATE 20 MG IM SOLR
20.0000 mg | INTRAMUSCULAR | Status: DC | PRN
  Administered 2020-04-06: 20 mg via INTRAMUSCULAR
  Filled 2020-04-05 (×3): qty 20

## 2020-04-05 MED ORDER — ACETAMINOPHEN 325 MG PO TABS
650.0000 mg | ORAL_TABLET | Freq: Four times a day (QID) | ORAL | Status: DC | PRN
  Administered 2020-04-05: 650 mg via ORAL
  Filled 2020-04-05 (×2): qty 2

## 2020-04-05 MED ORDER — LORAZEPAM 2 MG/ML IJ SOLN
2.0000 mg | INTRAMUSCULAR | Status: DC | PRN
  Administered 2020-04-06: 2 mg via INTRAMUSCULAR
  Filled 2020-04-05: qty 1

## 2020-04-05 NOTE — Consult Note (Signed)
Tamara [name] CONSULT NOTE  Patient Care Team: Physicians, [place] as PCP - General  CHIEF COMPLAINTS/PURPOSE OF CONSULTATION: Neglected breast cancer  HISTORY OF PRESENTING ILLNESS: Please note patient a poor historian given her schizophrenia.  Reviewed the chart/discussed with mother and sister.  Tamara [name] 49 y.o.  female history of schizophrenia who lives with her mother was brought to the hospital because of agitation by her sister.  During the hospital visit patient noted to have a large breast mass which led to the admission to the hospital for further work-up.   Ultrasound breast showed large left breast mass; also multiple axillary lymph nodes.  CT scan chest and pelvis showed-again the large breast mass multiple axillary; intramammary subpectoral lymph nodes.  Omental haziness noted; however not very convincing for metastatic disease.   Medical oncology surgery has been consulted for further evaluation recommendations for this patient.    Review of Systems  Unable to perform ROS: Psychiatric disorder     MEDICAL HISTORY:  Past Medical History:  Diagnosis Date  . GERD (gastroesophageal reflux disease)   . IUD (intrauterine device) in place IUD was placed in 2016  . Leiomyoma of uterus   . Schizophrenia ([place])   . Thyroid disease     SURGICAL HISTORY: No past surgical history on file.  SOCIAL HISTORY: Social History   Socioeconomic History  . Marital status: Single    Spouse name: Not on file  . Number of children: Not on file  . Years of education: Not on file  . Highest education level: Not on file  Occupational History  . Not on file  Tobacco Use  . Smoking status: Never Smoker  . Smokeless tobacco: Never Used  Substance and Sexual Activity  . Alcohol use: No  . Drug use: No  . Sexual activity: Yes    Birth control/protection: Surgical  Other Topics Concern  . Not on file  Social History Narrative  . Not on file   Social  Determinants of Health   Financial Resource Strain:   . Difficulty of Paying Living Expenses: Not on file  Food Insecurity:   . Worried About Charity fundraiser in the Last Year: Not on file  . Ran Out of Food in the Last Year: Not on file  Transportation Needs:   . Lack of Transportation (Medical): Not on file  . Lack of Transportation (Non-Medical): Not on file  Physical Activity:   . Days of Exercise per Week: Not on file  . Minutes of Exercise per Session: Not on file  Stress:   . Feeling of Stress : Not on file  Social Connections:   . Frequency of Communication with Friends and Family: Not on file  . Frequency of Social Gatherings with Friends and Family: Not on file  . Attends Religious Services: Not on file  . Active Member of Clubs or Organizations: Not on file  . Attends Archivist Meetings: Not on file  . Marital Status: Not on file  Intimate Partner Violence:   . Fear of Current or Ex-Partner: Not on file  . Emotionally Abused: Not on file  . Physically Abused: Not on file  . Sexually Abused: Not on file    FAMILY HISTORY: No family history on file.  ALLERGIES:  has No Known Allergies.  MEDICATIONS:  Current Facility-Administered Medications  Medication Dose Route Frequency Provider Last Rate Last Admin  . benztropine (COGENTIN) tablet 0.5 mg  0.5 mg Oral BID Cox,  Amy N, DO   0.5 mg at 04/05/20 0928  . diphenhydrAMINE (BENADRYL) capsule 25 mg  25 mg Oral Q6H PRN Cox, Amy N, DO      . divalproex (DEPAKOTE) DR tablet 500 mg  500 mg Oral Q12H Cox, Amy N, DO   500 mg at 04/05/20 0928  . enoxaparin (LOVENOX) injection 40 mg  40 mg Subcutaneous Q24H Cox, Amy N, DO   40 mg at 04/04/20 2149  . haloperidol (HALDOL) tablet 5 mg  5 mg Oral TID Cox, Amy N, DO   5 mg at 04/05/20 9629  . hydrOXYzine (ATARAX/VISTARIL) tablet 25 mg  25 mg Oral TID Cox, Amy N, DO   25 mg at 04/05/20 0928  . levothyroxine (SYNTHROID) tablet 88 mcg  88 mcg Oral Q0600 Cox, Amy N, DO       . loratadine (CLARITIN) tablet 10 mg  10 mg Oral Daily Cox, Amy N, DO   10 mg at 04/05/20 0928  . LORazepam (ATIVAN) injection 2 mg  2 mg Intramuscular PRN Clapacs, John T, MD      . ziprasidone (GEODON) injection 20 mg  20 mg Intramuscular PRN Clapacs, John T, MD          .  PHYSICAL EXAMINATION:  Vitals:   04/05/20 1204 04/05/20 1616  BP: 122/79 131/75  Pulse: (!) 107 (!) 111  Resp: 18 18  Temp: 98.8 F (37.1 C) 99.4 F (37.4 C)  SpO2: 100% 100%   Filed Weights   04/03/20 1523  Weight: 140 lb (63.5 kg)    Physical Exam HENT:     Head: Normocephalic and atraumatic.     Mouth/Throat:     Pharynx: No oropharyngeal exudate.  Eyes:     Pupils: Pupils are equal, round, and reactive to light.  Cardiovascular:     Rate and Rhythm: Normal rate and regular rhythm.  Pulmonary:     Effort: No respiratory distress.     Breath sounds: No wheezing.  Abdominal:     General: Bowel sounds are normal. There is no distension.     Palpations: Abdomen is soft. There is no mass.     Tenderness: There is no abdominal tenderness. There is no guarding or rebound.  Musculoskeletal:        General: No tenderness. Normal range of motion.     Cervical back: Normal range of motion and neck supple.  Skin:    General: Skin is warm.     Comments: Breast exam in presence of chaperone-large left breast mass; multiple axillary lymph nodes noted.  Skin warmth noted/mild erythema noted.  No breakdown.  No discharge.  Neurological:     Mental Status: She is alert.     Comments: Oriented x1-2.  No insight.  Psychiatric:        Mood and Affect: Affect normal.    LABORATORY DATA:  I have reviewed the data as listed Lab Results  Component Value Date   WBC 7.6 04/05/2020   HGB 11.3 (L) 04/05/2020   HCT 32.4 (L) 04/05/2020   MCV 89.3 04/05/2020   PLT 297 04/05/2020   Recent Labs    06/05/19 1806 06/05/19 1806 06/05/19 2117 06/05/19 2117 06/07/19 1145 04/03/20 1540 04/05/20 0400  NA  127*   < > 132*   < > 135 125* 131*  K 4.8   < > 3.5   < > 4.4 3.1* 3.9  CL 93*   < > 101   < > 98 88*  97*  CO2 26   < > 24   < > 26 24 26   GLUCOSE 90   < > 167*   < > 97 153* 88  BUN <5*   < > <5*   < > 8 <5* <5*  CREATININE 0.51   < > 0.51   < > 0.76 0.65 0.48  CALCIUM 8.7*   < > 8.1*   < > 10.1 9.5 9.1  GFRNONAA >60   < > >60   < > >60 >60 >60  GFRAA >60  --  >60  --  >60  --   --   PROT 7.1  --   --   --  7.6 8.6*  --   ALBUMIN 3.6  --   --   --  3.5 4.2  --   AST 15  --   --   --  15 25  --   ALT 7  --   --   --  8 13  --   ALKPHOS 64  --   --   --  66 68  --   BILITOT 0.6  --   --   --  0.4 0.4  --    < > = values in this interval not displayed.    RADIOGRAPHIC STUDIES: I have personally reviewed the radiological images as listed and agreed with the findings in the report. CT CHEST ABDOMEN PELVIS W CONTRAST  Result Date: 04/04/2020 CLINICAL DATA:  Left breast mass identified recently. Evaluate for metastatic disease. EXAM: CT CHEST, ABDOMEN, AND PELVIS WITH CONTRAST TECHNIQUE: Multidetector CT imaging of the chest, abdomen and pelvis was performed following the standard protocol during bolus administration of intravenous contrast. CONTRAST:  153mL OMNIPAQUE IOHEXOL 300 MG/ML  SOLN COMPARISON:  None. FINDINGS: CT CHEST FINDINGS Cardiovascular: The heart size is within normal limits. No pericardial effusion identified. Mediastinum/Nodes: Normal appearance of the thyroid gland. The trachea appears patent and is midline. Normal appearance of the esophagus. Multiple enlarged left axillary lymph nodes are identified. Index node measures 2.1 cm, image 18/4. Adjacent left axillary lymph node measures 2.8 cm, image 22/4. Enlarged left retropectoral lymph node measures 1.5 cm, image 13/4. Prominent left internal mammary lymph nodes are identified. The largest measures 9 mm short axis, image 21/4. Left supraclavicular lymph node measures 1 cm, image 9/4. No enlarged right axillary or  supraclavicular lymph nodes. No enlarged mediastinal or hilar lymph nodes. Lungs/Pleura: No pleural effusion. Patchy nonspecific areas of ground-glass attenuation and subsegmental atelectasis noted within the lung bases. No suspicious solid lung nodules identified to suggest pulmonary metastases. Musculoskeletal: Large left breast mass is noted measuring 8.4 x 6.8 cm, image 24/4. This extends posteriorly to the pectoralis major muscle and extends anteriorly to the skin surface. There is overlying skin thickening noted. Cannot exclude chest wall involvement. CT ABDOMEN PELVIS FINDINGS Hepatobiliary: No focal liver abnormality is seen. No gallstones, gallbladder wall thickening, or biliary dilatation. Pancreas: Unremarkable. No pancreatic ductal dilatation or surrounding inflammatory changes. Spleen: Normal in size without focal abnormality. Adrenals/Urinary Tract: The adrenal glands appear normal. No kidney mass identified bilaterally. Moderate distension of the urinary bladder. No focal bladder abnormality. Stomach/Bowel: Stomach is within normal limits. Appendix appears normal. No evidence of bowel wall thickening, distention, or inflammatory changes. Vascular/Lymphatic: Normal appearance of the abdominal aorta. No abdominopelvic adenopathy. Reproductive: Multiple enhancing pelvic masses are identified which appear closely associated with the uterus. Some of these likely represent uterine fibroids. Within the left pelvis there  is a mixed attenuation mass which measures 3.9 x 3.2 by 2.8 cm. This contains a focal area of fat attenuation measuring 1.2 cm, image 78/8. Other: There is no free fluid identified within the abdomen or pelvis. No focal fluid collections identified. Increased soft tissue stranding S is identified within the left upper quadrant peritoneal fat, image 63/4. No discrete peritoneal nodule or mass noted however. Musculoskeletal: No acute or significant osseous findings. IMPRESSION: 1. Large left  breast mass is identified compatible with primary breast carcinoma. This extends posteriorly to the pectoralis major muscle and extends anteriorly to the skin surface. Cannot exclude chest wall involvement. 2. Enlarged left axillary, left retropectoral, and left internal mammary lymph nodes compatible with metastatic adenopathy. 3. Soft tissue stranding within the left upper quadrant of the abdomen is identified. There is also mild soft tissue stranding is in the omentum. No discrete measurable nodule identified. Although not diagnostic for peritoneal disease, given the extent of tumor involvement in the left breast and left axilla early peritoneal disease cannot be excluded. 4. Multiple enhancing pelvic masses are identified which appear closely associated with the uterus. Some of these likely represent uterine fibroids. Within the left pelvis there is a mixed attenuation mass which contains a focal area of fat attenuation. 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 Advise further evaluation with pelvic sonogram for more definitive characterization. Electronically Signed   By: Kerby Moors M.D.   On: 04/04/2020 19:28   US BREAST LTD UNI LEFT INC AXILLA  Result Date: 04/03/2020 CLINICAL DATA:  Patient presents with redness of the left breast and a large left breast mass. The patient was evaluated through the emergency department for evaluation of possible abscess. EXAM: ULTRASOUND OF THE LEFT BREAST COMPARISON:  None. FINDINGS: On physical exam, there is a large mass within the left breast. Targeted ultrasound is performed, showing a 6.3 x 6.2 x 7.1 cm lobular hypoechoic solid mass left breast 1 o'clock position (upper-outer quadrant). Multiple abnormal cortically thickened left axillary lymph nodes are demonstrated measuring up to 2.6 cm with effacement of the fatty hilum. IMPRESSION: 1. Large solid mass left breast 1 o'clock position concerning for malignancy. 2. Multiple abnormal cortically  thickened left axillary lymph nodes concerning for metastatic adenopathy. RECOMMENDATION: 1. Recommend evaluation of the right and left breast with dedicated mammography and ultrasound at a dedicated breast center. This will allow for comprehensive evaluation given the concern of left breast malignancy. 2. Ultimately patient will need at least a biopsy of the large palpable left breast mass and 1 of the left axillary lymph nodes. BI-RADS CATEGORY  5: Highly suggestive of malignancy. These results were called by telephone at the time of interpretation on 04/03/2020 at 4:33 pm to provider Dr. Charna Archer, who verbally acknowledged these results. Electronically Signed   By: Lovey Newcomer M.D.   On: 04/03/2020 16:51    Large mass of left breast #49 year old female patient with schizophrenia-currently noted to have a large left breast mass-concerning for malignancy.  #Left breast mass-based on clinical exam imaging-highly concerning for malignancy.  Given the multiple lymph nodes-at least stage III/locally advanced disease.   #Schizophrenia-  Recommendation:   #Recommend ultrasound-guided biopsy of the left breast mass/lymph node.  Discussed with breast center; will plan biopsy tomorrow.   #Also discussed with Dr. Trilby Leaver kindly agrees to adjust patient's medication to help her get through the biopsy tomorrow.   #Long discussion with the patient's sister Christella Hartigan cannot be available because of her medical issues].  Discussed that in general would recommend neoadjuvant chemotherapy followed by surgery/radiation.  However given patient schizophrenia-patient is a poor candidate for any aggressive chemotherapy regimens.  If ER/PR positive-could consider CDK inhibitor/Faslodex.   # 60 minutes face-to-face with the patient discussing the above plan of care; more than 50% of time spent on prognosis/ natural history; counseling and coordination.  Discussed with primary service; Dr. Peyton Najjar; also Josh Borders.    # I reviewed the blood work- with the patient in detail; also reviewed the imaging independently [as summarized above]; and with the patient in detail.     All questions were answered. The patient knows to call the clinic with any problems, questions or concerns.    Cammie Sickle, MD 04/05/2020 4:32 PM

## 2020-04-05 NOTE — Progress Notes (Signed)
   04/05/20 1616  Assess: MEWS Score  Temp 99.4 F (37.4 C)  BP 131/75  Pulse Rate (!) 111  Resp 18  SpO2 100 %  O2 Device Room Air  Assess: MEWS Score  MEWS Temp 0  MEWS Systolic 0  MEWS Pulse 2  MEWS RR 0  MEWS LOC 0  MEWS Score 2  MEWS Score Color Yellow  Assess: if the MEWS score is Yellow or Red  Were vital signs taken at a resting state? Yes  Focused Assessment Change from prior assessment (see assessment flowsheet)  Early Detection of Sepsis Score *See Row Information* Low  MEWS guidelines implemented *See Row Information* Yes  Treat  MEWS Interventions Escalated (See documentation below)  Pain Scale 0-10  Notify: Charge Nurse/RN  Name of Charge Nurse/RN Notified [place] Cloer  Date Charge Nurse/RN Notified 04/05/20  Time Charge Nurse/RN Notified 1655  Notify: Provider  Provider Name/Title Dr. Doristine Bosworth  Date Provider Notified 04/05/20  Time Provider Notified 1700  Notification Type Page  Notification Reason Change in status  Response No new orders  Date of Provider Response 04/05/20  Time of Provider Response 1720  Document  Patient Outcome Other (Comment) (remain on floor)  Progress note created (see row info) Yes

## 2020-04-05 NOTE — Progress Notes (Signed)
[facility] Day(s): 1.   Post op day(s):  Marland Kitchen   Interval History: Patient seen and examined, no acute events or new complaints overnight. Patient unable to give any complain regarding her problem of admission. As per nurse, there is no new issues.  Vital signs in last 24 hours: [min-max] current  Temp:  [98 F (36.7 C)-100.6 F (38.1 C)] 100.5 F (38.1 C) (11/11 2200) Pulse Rate:  [90-128] 126 (11/11 2200) Resp:  [16-19] 16 (11/11 2200) BP: (102-138)/(56-84) 102/66 (11/11 2200) SpO2:  [98 %-100 %] 99 % (11/11 2200)     Height: 5\' 1"  (154.9 cm) Weight: 63.5 kg BMI (Calculated): 26.47   Physical Exam:  Constitutional: alert, cooperative and no distress  Respiratory: breathing non-labored at rest  Cardiovascular: regular rate and sinus rhythm Breast: large palpable mass of the left breast with palpable left axillary node biopsy.   Labs:  CBC Latest Ref Rng & Units 04/05/2020 04/03/2020 06/07/2019  WBC 4.0 - 10.5 K/uL 7.6 12.1(H) 7.1  Hemoglobin 12.0 - 15.0 g/dL 11.3(L) 12.5 12.1  Hematocrit 36 - 46 % 32.4(L) 35.8(L) 35.8(L)  Platelets 150 - 400 K/uL 297 342 170   CMP Latest Ref Rng & Units 04/05/2020 04/03/2020 06/07/2019  Glucose 70 - 99 mg/dL 88 153(H) 97  BUN 6 - 20 mg/dL <5(L) <5(L) 8  Creatinine 0.44 - 1.00 mg/dL 0.48 0.65 0.76  Sodium 135 - 145 mmol/L 131(L) 125(L) 135  Potassium 3.5 - 5.1 mmol/L 3.9 3.1(L) 4.4  Chloride 98 - 111 mmol/L 97(L) 88(L) 98  CO2 22 - 32 mmol/L 26 24 26   Calcium 8.9 - 10.3 mg/dL 9.1 9.5 10.1  Total Protein 6.5 - 8.1 g/dL - 8.6(H) 7.6  Total Bilirubin 0.3 - 1.2 mg/dL - 0.4 0.4  Alkaline Phos 38 - 126 U/L - 68 66  AST 15 - 41 U/L - 25 15  ALT 0 - 44 U/L - 13 8    Imaging studies: I personally evaluated the CT scan of the abdomen, pelvis and chest.    Assessment/Plan:  49 y.o. female with large left breast mass, complicated by pertinent comorbidities including schizophrenia.  Patient without new complains of change on  physical exam. I was able to be present in the discussion with the sister. I oriented the sister about the surgical management of breast cancer. In this case patient will likely need at least a modified radical mastectomy. Very complicated case surgically due to the locally advanced disease. Medical oncologist coordinated the biopsy for tomorrow. Will follow.   Arnold Long, MD

## 2020-04-05 NOTE — Consult Note (Signed)
[facility] Consult   Reason for Consult: Follow-up consult for this 49 year old woman with intellectual disability and schizophrenia currently on the medical service due to work-up for cancer Referring Physician:  Pahwani Patient Identification: Tamara [name] MRN:  [id number] Principal Diagnosis: Mild intellectual disability Diagnosis:  Principal Problem:   Mild intellectual disability Active Problems:   Schizophrenia, paranoid type ([place])   Congenital hypothyroidism without goiter   Schizophrenia ([place])   Large mass of left breast   Total Time spent with patient: 30 minutes  Subjective:   Tamara [name] is a 49 y.o. female patient admitted with "I am doing okay".  HPI: Met with patient.  Reviewed chart.  I also spoke with the patient's mother who is her legal guardian.  Patient has no new complaints.  Does not complain of any pain in the area of the breast mass.  She says that she slept okay last night.  No complaints about medicine.  Denies any hallucinations currently.  Not expressing any suicidal or homicidal ideation.  She tells me with some pride that she got a CAT scan and was cooperative with that as well as the ultrasound.  Mother tells me that the patient has been behaviorally disturbed for several months now despite continuing to get her Haldol Decanoate shot.  Past Psychiatric History: Past history of chronic intellectual disability and schizophrenia.  Multiple prior admissions.  Has an act team.  Usually fairly stable on combination of haloperidol and Depakote  Risk to Self:   Risk to Others:   Prior Inpatient Therapy:   Prior Outpatient Therapy:    Past Medical History:  Past Medical History:  Diagnosis Date  . GERD (gastroesophageal reflux disease)   . IUD (intrauterine device) in place IUD was placed in 2016  . Leiomyoma of uterus   . Schizophrenia ([place])   . Thyroid disease    No past surgical history on file. Family History: No family history  on file. Family Psychiatric  History: See previous. Social History:  Social History   Substance and Sexual Activity  Alcohol Use No     Social History   Substance and Sexual Activity  Drug Use No    Social History   Socioeconomic History  . Marital status: Single    Spouse name: Not on file  . Number of children: Not on file  . Years of education: Not on file  . Highest education level: Not on file  Occupational History  . Not on file  Tobacco Use  . Smoking status: Never Smoker  . Smokeless tobacco: Never Used  Substance and Sexual Activity  . Alcohol use: No  . Drug use: No  . Sexual activity: Yes    Birth control/protection: Surgical  Other Topics Concern  . Not on file  Social History Narrative  . Not on file   Social Determinants of Health   Financial Resource Strain:   . Difficulty of Paying Living Expenses: Not on file  Food Insecurity:   . Worried About Charity fundraiser in the Last Year: Not on file  . Ran Out of Food in the Last Year: Not on file  Transportation Needs:   . Lack of Transportation (Medical): Not on file  . Lack of Transportation (Non-Medical): Not on file  Physical Activity:   . Days of Exercise per Week: Not on file  . Minutes of Exercise per Session: Not on file  Stress:   . Feeling of Stress : Not on file  Social Connections:   . Frequency of Communication with Friends and Family: Not on file  . Frequency of Social Gatherings with Friends and Family: Not on file  . Attends Religious Services: Not on file  . Active Member of Clubs or Organizations: Not on file  . Attends Archivist Meetings: Not on file  . Marital Status: Not on file   Additional Social History:    Allergies:  No Known Allergies  Labs:  Results for orders placed or performed during the hospital encounter of 04/03/20 (from the past 48 hour(s))  CBC with Differential     Status: Abnormal   Collection Time: 04/03/20  3:40 PM  Result Value Ref  Range   WBC 12.1 (H) 4.0 - 10.5 K/uL   RBC 4.09 3.87 - 5.11 MIL/uL   Hemoglobin 12.5 12.0 - 15.0 g/dL   HCT 35.8 (L) 36 - 46 %   MCV 87.5 80.0 - 100.0 fL   MCH 30.6 26.0 - 34.0 pg   MCHC 34.9 30.0 - 36.0 g/dL   RDW 12.6 11.5 - 15.5 %   Platelets 342 150 - 400 K/uL   nRBC 0.0 0.0 - 0.2 %   Neutrophils Relative % 77 %   Neutro Abs 9.2 (H) 1.7 - 7.7 K/uL   Lymphocytes Relative 12 %   Lymphs Abs 1.5 0.7 - 4.0 K/uL   Monocytes Relative 10 %   Monocytes Absolute 1.3 (H) 0.1 - 1.0 K/uL   Eosinophils Relative 0 %   Eosinophils Absolute 0.0 0.0 - 0.5 K/uL   Basophils Relative 0 %   Basophils Absolute 0.0 0.0 - 0.1 K/uL   Immature Granulocytes 1 %   Abs Immature Granulocytes 0.17 (H) 0.00 - 0.07 K/uL    Comment: Performed at [facility], [address] [place], [place] [address]  Comprehensive metabolic panel     Status: Abnormal   Collection Time: 04/03/20  3:40 PM  Result Value Ref Range   Sodium 125 (L) 135 - 145 mmol/L   Potassium 3.1 (L) 3.5 - 5.1 mmol/L   Chloride 88 (L) 98 - 111 mmol/L   CO2 24 22 - 32 mmol/L   Glucose, Bld 153 (H) 70 - 99 mg/dL    Comment: Glucose reference range applies only to samples taken after fasting for at least 8 hours.   BUN <5 (L) 6 - 20 mg/dL   Creatinine, Ser 0.65 0.44 - 1.00 mg/dL   Calcium 9.5 8.9 - 10.3 mg/dL   Total Protein 8.6 (H) 6.5 - 8.1 g/dL   Albumin 4.2 3.5 - 5.0 g/dL   AST 25 15 - 41 U/L   ALT 13 0 - 44 U/L   Alkaline Phosphatase 68 38 - 126 U/L   Total Bilirubin 0.4 0.3 - 1.2 mg/dL   GFR, Estimated >60 >60 mL/min    Comment: (NOTE) Calculated using the CKD-EPI Creatinine Equation (2021)    Anion gap 13 5 - 15    Comment: Performed at [facility], [address], [place], [place] [address]  Ethanol     Status: None   Collection Time: 04/03/20  3:40 PM  Result Value Ref Range   Alcohol, Ethyl (B) <10 <10 mg/dL    Comment: (NOTE) Lowest detectable limit for serum alcohol is 10 mg/dL.  For medical  purposes only. Performed at [facility], [address] [place], [place] [address]   Respiratory Panel by RT PCR (Flu A&B, Covid) - Nasopharyngeal Swab  Status: None   Collection Time: 04/03/20  3:43 PM   Specimen: Nasopharyngeal Swab  Result Value Ref Range   SARS Coronavirus 2 by RT PCR NEGATIVE NEGATIVE    Comment: (NOTE) SARS-CoV-2 target nucleic acids are NOT DETECTED.  The SARS-CoV-2 RNA is generally detectable in upper respiratoy specimens during the acute phase of infection. The lowest concentration of SARS-CoV-2 viral copies this assay can detect is 131 copies/mL. A negative result does not preclude SARS-Cov-2 infection and should not be used as the sole basis for treatment or other patient management decisions. A negative result may occur with  improper specimen collection/handling, submission of specimen other than nasopharyngeal swab, presence of viral mutation(s) within the areas targeted by this assay, and inadequate number of viral copies (<131 copies/mL). A negative result must be combined with clinical observations, patient history, and epidemiological information. The expected result is Negative.  Fact Sheet for Patients:  PinkCheek.be  Fact Sheet for Healthcare Providers:  GravelBags.it  This test is no t yet approved or cleared by the [place] FDA and  has been authorized for detection and/or diagnosis of SARS-CoV-2 by FDA under an Emergency Use Authorization (EUA). This EUA will remain  in effect (meaning this test can be used) for the duration of the COVID-19 declaration under Section 564(b)(1) of the Act, 21 U.S.C. section 360bbb-3(b)(1), unless the authorization is terminated or revoked sooner.     Influenza A by PCR NEGATIVE NEGATIVE   Influenza B by PCR NEGATIVE NEGATIVE    Comment: (NOTE) The Xpert Xpress SARS-CoV-2/FLU/RSV assay is intended as an aid in  the diagnosis  of influenza from Nasopharyngeal swab specimens and  should not be used as a sole basis for treatment. Nasal washings and  aspirates are unacceptable for Xpert Xpress SARS-CoV-2/FLU/RSV  testing.  Fact Sheet for Patients: PinkCheek.be  Fact Sheet for Healthcare Providers: GravelBags.it  This test is not yet approved or cleared by the [place] FDA and  has been authorized for detection and/or diagnosis of SARS-CoV-2 by  FDA under an Emergency Use Authorization (EUA). This EUA will remain  in effect (meaning this test can be used) for the duration of the  Covid-19 declaration under Section 564(b)(1) of the Act, 21  U.S.C. section 360bbb-3(b)(1), unless the authorization is  terminated or revoked. Performed at [facility], [address] [place], [place] [address]   Valproic acid level     Status: None   Collection Time: 04/04/20  4:02 AM  Result Value Ref Range   Valproic Acid Lvl 93 50.0 - 100.0 ug/mL    Comment: Performed at [facility], [address] [place], [place] [address]  HIV Antibody (routine testing w rflx)     Status: None   Collection Time: 04/04/20  4:02 AM  Result Value Ref Range   HIV Screen 4th Generation wRfx Non Reactive Non Reactive    Comment: Performed at [facility], [place] [address], [place], [place] [address]  CBC     Status: Abnormal   Collection Time: 04/05/20  4:00 AM  Result Value Ref Range   WBC 7.6 4.0 - 10.5 K/uL   RBC 3.63 (L) 3.87 - 5.11 MIL/uL   Hemoglobin 11.3 (L) 12.0 - 15.0 g/dL   HCT 32.4 (L) 36 - 46 %   MCV 89.3 80.0 - 100.0 fL   MCH 31.1 26.0 - 34.0 pg   MCHC 34.9 30.0 - 36.0 g/dL   RDW 13.2 11.5 - 15.5 %  Platelets 297 150 - 400 K/uL   nRBC 0.0 0.0 - 0.2 %    Comment: Performed at [facility], [address] [place], [place] [address]  Basic metabolic panel     Status: Abnormal   Collection Time: 04/05/20  4:00 AM  Result Value  Ref Range   Sodium 131 (L) 135 - 145 mmol/L   Potassium 3.9 3.5 - 5.1 mmol/L   Chloride 97 (L) 98 - 111 mmol/L   CO2 26 22 - 32 mmol/L   Glucose, Bld 88 70 - 99 mg/dL    Comment: Glucose reference range applies only to samples taken after fasting for at least 8 hours.   BUN <5 (L) 6 - 20 mg/dL   Creatinine, Ser 0.48 0.44 - 1.00 mg/dL   Calcium 9.1 8.9 - 10.3 mg/dL   GFR, Estimated >60 >60 mL/min    Comment: (NOTE) Calculated using the CKD-EPI Creatinine Equation (2021)    Anion gap 8 5 - 15    Comment: Performed at [facility], [address] [place], [place] [address]  Magnesium     Status: None   Collection Time: 04/05/20  4:00 AM  Result Value Ref Range   Magnesium 2.1 1.7 - 2.4 mg/dL    Comment: Performed at [facility], [address], [place], [place] [address]    Current Facility-Administered Medications  Medication Dose Route Frequency Provider Last Rate Last Admin  . benztropine (COGENTIN) tablet 0.5 mg  0.5 mg Oral BID Cox, Amy N, DO   0.5 mg at 04/05/20 0928  . diphenhydrAMINE (BENADRYL) capsule 25 mg  25 mg Oral Q6H PRN Cox, Amy N, DO      . divalproex (DEPAKOTE) DR tablet 500 mg  500 mg Oral Q12H Cox, Amy N, DO   500 mg at 04/05/20 0928  . enoxaparin (LOVENOX) injection 40 mg  40 mg Subcutaneous Q24H Cox, Amy N, DO   40 mg at 04/04/20 2149  . haloperidol (HALDOL) tablet 5 mg  5 mg Oral TID Cox, Amy N, DO   5 mg at 04/05/20 5397  . hydrOXYzine (ATARAX/VISTARIL) tablet 25 mg  25 mg Oral TID Cox, Amy N, DO   25 mg at 04/05/20 0928  . levothyroxine (SYNTHROID) tablet 88 mcg  88 mcg Oral Q0600 Cox, Amy N, DO      . loratadine (CLARITIN) tablet 10 mg  10 mg Oral Daily Cox, Amy N, DO   10 mg at 04/05/20 6734    Musculoskeletal: Strength & Muscle Tone: within normal limits Gait & Station: normal Patient leans: N/A  Psychiatric Specialty Exam: Physical Exam Vitals and nursing note reviewed.  Constitutional:      Appearance: She is well-developed.   HENT:     Head: Normocephalic and atraumatic.  Eyes:     Conjunctiva/sclera: Conjunctivae normal.     Pupils: Pupils are equal, round, and reactive to light.  Cardiovascular:     Heart sounds: Normal heart sounds.  Pulmonary:     Effort: Pulmonary effort is normal.  Abdominal:     Palpations: Abdomen is soft.  Musculoskeletal:        General: Normal range of motion.     Cervical back: Normal range of motion.  Skin:    General: Skin is warm and dry.  Neurological:     Mental Status: She is alert.  Psychiatric:        Attention and Perception: Attention normal.        Mood  and Affect: Mood normal.        Speech: Speech normal.        Behavior: Behavior is cooperative.        Thought Content: Thought content normal.        Cognition and Memory: Cognition is impaired. Memory is impaired.        Judgment: Judgment is inappropriate.     Review of Systems  Constitutional: Negative.   HENT: Negative.   Eyes: Negative.   Respiratory: Negative.   Cardiovascular: Negative.   Gastrointestinal: Negative.   Musculoskeletal: Negative.   Skin: Negative.   Neurological: Negative.   Psychiatric/Behavioral: Positive for behavioral problems.    Blood pressure 117/67, pulse 94, temperature 98.5 F (36.9 C), temperature source Oral, resp. rate 18, height 5' 1"  (1.549 m), weight 63.5 kg, SpO2 98 %.Body mass index is 26.45 kg/m.  General Appearance: Casual  Eye Contact:  Good  Speech:  Clear and Coherent  Volume:  Normal  Mood:  Euthymic  Affect:  Congruent  Thought Process:  Disorganized  Orientation:  Full (Time, Place, and Person)  Thought Content:  Tangential  Suicidal Thoughts:  No  Homicidal Thoughts:  No  Memory:  Immediate;   Fair Recent;   Fair Remote;   Fair  Judgement:  Impaired  Insight:  Shallow  Psychomotor Activity:  Normal  Concentration:  Concentration: Fair  Recall:  [place] of Knowledge:  Poor  Language:  Fair  Akathisia:  No  Handed:  Right  AIMS (if  indicated):     Assets:  Desire for Improvement Housing Resilience Social Support  ADL's:  Impaired  Cognition:  Impaired,  Mild  Sleep:        Treatment Plan Summary: Daily contact with patient to assess and evaluate symptoms and progress in treatment, Medication management and Plan I spoke with the patient's mother and made it very clear to her that all signs point to a high likelihood of this lesion being cancer.  I wanted to make sure the family was getting that message loud and clear in case there was any risk of her falling out of treatment after hospitalization.  I noted with some surprise the nursing note saying that they wanted to do the mammogram outpatient because it had nothing to do with the reason for her hospitalization.  Her current hospitalization on the medical service is entirely because of the need to work-up this new breast mass.  Anyway for now continue current psychiatric medicine.  I will continue to follow-up.  Honestly I doubt that she needs inpatient psychiatric admission given her current behavior but she will certainly need ongoing support from her act team for the stress of the medical treatment.  Supportive counseling to the patient.  No change to orders.  Disposition: No evidence of imminent risk to self or others at present.   Supportive therapy provided about ongoing stressors. Discussed crisis plan, support from social network, calling 911, coming to the Emergency Department, and calling Suicide Hotline.  Alethia Berthold, MD 04/05/2020 12:01 PM

## 2020-04-05 NOTE — Assessment & Plan Note (Addendum)
#  49 year old female patient with schizophrenia-currently noted to have a large left breast mass-concerning for malignancy.  # Left breast mass-based on clinical exam imaging- Biopsy positive for high grade malignancy- awaiting ER/PR/her-2 neu status.   # Schizophrenia-at baseline STABLE.   The above plan of care was discussed with the patient's sister; and Also Dr.Pahwani.  Patient will follow up with me in the clinic in the next week to discuss treatment options.  Also reviewed the possibility of upfront palliative mastectomy with Dr. Peyton Najjar.  However no definite recommendations at this time.

## 2020-04-05 NOTE — Consult Note (Signed)
[place]  [phone] [phone]   Name: Tamara [name] Date: 04/05/2020 MRN: [id number]  DOB: 09/15/1970  Patient Care Team: Physicians, [place] [place] as PCP - General    REASON FOR CONSULTATION: Tamara [name] is a 49 y.o. female with multiple medical problems including schizophrenia, intellectual disability, who was brought to the hospital on 04/03/2020 for worsening agitation and was IVC due to acute psychosis.  Patient was incidentally found to have a large left breast mass with imaging concerning for malignancy.  Palliative care was consulted to address goals.  SOCIAL HISTORY:     reports that she has never smoked. She has never used smokeless tobacco. She reports that she does not drink alcohol and does not use drugs.  Patient is unmarried.  She has no children.  She lives at home with her mother and father. She has two sisters who are involved in her care.   ADVANCE DIRECTIVES:  Reportedly patient's mother is her legal guardian  CODE STATUS: Full code  PAST MEDICAL HISTORY: Past Medical History:  Diagnosis Date  . GERD (gastroesophageal reflux disease)   . IUD (intrauterine device) in place IUD was placed in 2016  . Leiomyoma of uterus   . Schizophrenia ([place])   . Thyroid disease     PAST SURGICAL HISTORY: No past surgical history on file.  HEMATOLOGY/ONCOLOGY HISTORY:  Oncology History   No history exists.    ALLERGIES:  has No Known Allergies.  MEDICATIONS:  Current Facility-Administered Medications  Medication Dose Route Frequency Provider Last Rate Last Admin  . benztropine (COGENTIN) tablet 0.5 mg  0.5 mg Oral BID Cox, Amy N, DO   0.5 mg at 04/05/20 0928  . diphenhydrAMINE (BENADRYL) capsule 25 mg  25 mg Oral Q6H PRN Cox, Amy N, DO      . divalproex (DEPAKOTE) DR tablet 500 mg  500 mg Oral Q12H Cox, Amy N, DO   500 mg at 04/05/20 0928  . enoxaparin (LOVENOX) injection 40 mg  40 mg  Subcutaneous Q24H Cox, Amy N, DO   40 mg at 04/04/20 2149  . haloperidol (HALDOL) tablet 5 mg  5 mg Oral TID Cox, Amy N, DO   5 mg at 04/05/20 0034  . hydrOXYzine (ATARAX/VISTARIL) tablet 25 mg  25 mg Oral TID Cox, Amy N, DO   25 mg at 04/05/20 0928  . levothyroxine (SYNTHROID) tablet 88 mcg  88 mcg Oral Q0600 Cox, Amy N, DO      . loratadine (CLARITIN) tablet 10 mg  10 mg Oral Daily Cox, Amy N, DO   10 mg at 04/05/20 0928    VITAL SIGNS: BP 122/79 (BP Location: Right Arm)   Pulse (!) 107   Temp 98.8 F (37.1 C) (Oral)   Resp 18   Ht 5' 1" (1.549 m)   Wt 140 lb (63.5 kg)   SpO2 100%   BMI 26.45 kg/m  Filed Weights   04/03/20 1523  Weight: 140 lb (63.5 kg)    Estimated body mass index is 26.45 kg/m as calculated from the following:   Height as of this encounter: 5' 1" (1.549 m).   Weight as of this encounter: 140 lb (63.5 kg).  LABS: CBC:    Component Value Date/Time   WBC 7.6 04/05/2020 0400   HGB 11.3 (L) 04/05/2020 0400   HGB 13.4 11/11/2013 2142   HCT 32.4 (L) 04/05/2020 0400   HCT 39.3 11/11/2013 2142   PLT  297 04/05/2020 0400   PLT 295 11/11/2013 2142   MCV 89.3 04/05/2020 0400   MCV 90 11/11/2013 2142   NEUTROABS 9.2 (H) 04/03/2020 1540   LYMPHSABS 1.5 04/03/2020 1540   MONOABS 1.3 (H) 04/03/2020 1540   EOSABS 0.0 04/03/2020 1540   BASOSABS 0.0 04/03/2020 1540   Comprehensive Metabolic Panel:    Component Value Date/Time   NA 131 (L) 04/05/2020 0400   NA 135 (L) 11/14/2013 1309   K 3.9 04/05/2020 0400   K 3.9 11/14/2013 1309   CL 97 (L) 04/05/2020 0400   CL 97 (L) 11/14/2013 1309   CO2 26 04/05/2020 0400   CO2 29 11/14/2013 1309   BUN <5 (L) 04/05/2020 0400   BUN 3 (L) 11/14/2013 1309   CREATININE 0.48 04/05/2020 0400   CREATININE 0.88 11/14/2013 1309   GLUCOSE 88 04/05/2020 0400   GLUCOSE 117 (H) 11/14/2013 1309   CALCIUM 9.1 04/05/2020 0400   CALCIUM 9.6 11/14/2013 1309   AST 25 04/03/2020 1540   AST 25 11/11/2013 2142   ALT 13 04/03/2020  1540   ALT 18 11/11/2013 2142   ALKPHOS 68 04/03/2020 1540   ALKPHOS 111 11/11/2013 2142   BILITOT 0.4 04/03/2020 1540   BILITOT 0.3 11/11/2013 2142   PROT 8.6 (H) 04/03/2020 1540   PROT 8.1 11/11/2013 2142   ALBUMIN 4.2 04/03/2020 1540   ALBUMIN 3.8 11/11/2013 2142    RADIOGRAPHIC STUDIES: CT CHEST ABDOMEN PELVIS W CONTRAST  Result Date: 04/04/2020 CLINICAL DATA:  Left breast mass identified recently. Evaluate for metastatic disease. EXAM: CT CHEST, ABDOMEN, AND PELVIS WITH CONTRAST TECHNIQUE: Multidetector CT imaging of the chest, abdomen and pelvis was performed following the standard protocol during bolus administration of intravenous contrast. CONTRAST:  156m OMNIPAQUE IOHEXOL 300 MG/ML  SOLN COMPARISON:  None. FINDINGS: CT CHEST FINDINGS Cardiovascular: The heart size is within normal limits. No pericardial effusion identified. Mediastinum/Nodes: Normal appearance of the thyroid gland. The trachea appears patent and is midline. Normal appearance of the esophagus. Multiple enlarged left axillary lymph nodes are identified. Index node measures 2.1 cm, image 18/4. Adjacent left axillary lymph node measures 2.8 cm, image 22/4. Enlarged left retropectoral lymph node measures 1.5 cm, image 13/4. Prominent left internal mammary lymph nodes are identified. The largest measures 9 mm short axis, image 21/4. Left supraclavicular lymph node measures 1 cm, image 9/4. No enlarged right axillary or supraclavicular lymph nodes. No enlarged mediastinal or hilar lymph nodes. Lungs/Pleura: No pleural effusion. Patchy nonspecific areas of ground-glass attenuation and subsegmental atelectasis noted within the lung bases. No suspicious solid lung nodules identified to suggest pulmonary metastases. Musculoskeletal: Large left breast mass is noted measuring 8.4 x 6.8 cm, image 24/4. This extends posteriorly to the pectoralis major muscle and extends anteriorly to the skin surface. There is overlying skin thickening  noted. Cannot exclude chest wall involvement. CT ABDOMEN PELVIS FINDINGS Hepatobiliary: No focal liver abnormality is seen. No gallstones, gallbladder wall thickening, or biliary dilatation. Pancreas: Unremarkable. No pancreatic ductal dilatation or surrounding inflammatory changes. Spleen: Normal in size without focal abnormality. Adrenals/Urinary Tract: The adrenal glands appear normal. No kidney mass identified bilaterally. Moderate distension of the urinary bladder. No focal bladder abnormality. Stomach/Bowel: Stomach is within normal limits. Appendix appears normal. No evidence of bowel wall thickening, distention, or inflammatory changes. Vascular/Lymphatic: Normal appearance of the abdominal aorta. No abdominopelvic adenopathy. Reproductive: Multiple enhancing pelvic masses are identified which appear closely associated with the uterus. Some of these likely represent uterine fibroids. Within the  left pelvis there is a mixed attenuation mass which measures 3.9 x 3.2 by 2.8 cm. This contains a focal area of fat attenuation measuring 1.2 cm, image 78/8. Other: There is no free fluid identified within the abdomen or pelvis. No focal fluid collections identified. Increased soft tissue stranding S is identified within the left upper quadrant peritoneal fat, image 63/4. No discrete peritoneal nodule or mass noted however. Musculoskeletal: No acute or significant osseous findings. IMPRESSION: 1. Large left breast mass is identified compatible with primary breast carcinoma. This extends posteriorly to the pectoralis major muscle and extends anteriorly to the skin surface. Cannot exclude chest wall involvement. 2. Enlarged left axillary, left retropectoral, and left internal mammary lymph nodes compatible with metastatic adenopathy. 3. Soft tissue stranding within the left upper quadrant of the abdomen is identified. There is also mild soft tissue stranding is in the omentum. No discrete measurable nodule identified.  Although not diagnostic for peritoneal disease, given the extent of tumor involvement in the left breast and left axilla early peritoneal disease cannot be excluded. 4. Multiple enhancing pelvic masses are identified which appear closely associated with the uterus. Some of these likely represent uterine fibroids. Within the left pelvis there is a mixed attenuation mass which contains a focal area of fat attenuation. 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 Advise further evaluation with pelvic sonogram for more definitive characterization. Electronically Signed   By: Kerby Moors M.D.   On: 04/04/2020 19:28   US BREAST LTD UNI LEFT INC AXILLA  Result Date: 04/03/2020 CLINICAL DATA:  Patient presents with redness of the left breast and a large left breast mass. The patient was evaluated through the emergency department for evaluation of possible abscess. EXAM: ULTRASOUND OF THE LEFT BREAST COMPARISON:  None. FINDINGS: On physical exam, there is a large mass within the left breast. Targeted ultrasound is performed, showing a 6.3 x 6.2 x 7.1 cm lobular hypoechoic solid mass left breast 1 o'clock position (upper-outer quadrant). Multiple abnormal cortically thickened left axillary lymph nodes are demonstrated measuring up to 2.6 cm with effacement of the fatty hilum. IMPRESSION: 1. Large solid mass left breast 1 o'clock position concerning for malignancy. 2. Multiple abnormal cortically thickened left axillary lymph nodes concerning for metastatic adenopathy. RECOMMENDATION: 1. Recommend evaluation of the right and left breast with dedicated mammography and ultrasound at a dedicated breast center. This will allow for comprehensive evaluation given the concern of left breast malignancy. 2. Ultimately patient will need at least a biopsy of the large palpable left breast mass and 1 of the left axillary lymph nodes. BI-RADS CATEGORY  5: Highly suggestive of malignancy. These results were called by  telephone at the time of interpretation on 04/03/2020 at 4:33 pm to provider Dr. Charna Archer, who verbally acknowledged these results. Electronically Signed   By: Lovey Newcomer M.D.   On: 04/03/2020 16:51    PERFORMANCE STATUS (ECOG) : 2 - Symptomatic, <50% confined to bed  Review of Systems Unable to complete  Physical Exam General: NAD, thin Pulmonary: Unlabored Extremities: no edema, no joint deformities Skin: no rashes Neurological: Weakness, confused  IMPRESSION: Patient is confused.  She lacks any insight into her current health problems.  She is requiring a Actuary for safety.  Currently, patient is comfortable appearing.  With Drs. Rogue Bussing and Cintron-Diaz, I met with patient's sister.  Together we reviewed patient's current medical problems including probable diagnosis of at least stage III breast cancer.  Imaging shows the cancer extending into  the pectoral major muscle and patient might require extensive radical mastectomy.  [name] treatment options are likely complicated by her decompensated schizophrenia.  Family admits that patient would have to be "tricked" to come to medical appointments.  We explained that chemotherapy would require patient to sit alone for several hours in the [place].  Despite this information, family verbalized that they are still interested in pursuing any treatment options available.  Plan is to pursue biopsy.  Discussed options for agitation management with Dr. Weber Cooks to hopefully facilitate biopsy.  At baseline, patient reportedly is fairly independent with most ADLs but does require supervision and assistance from family.  Sister says that patient's mother is the legal guardian.  However, the mother was just hospitalized and is apparently also in poor health and so patient sister is speaking on behalf of the family.  Discussed CODE STATUS and family request that patient remain a full code.  PLAN: -Continue current scope of treatment -Full  code -Will follow   Time Total: 60 minutes  Visit consisted of counseling and education dealing with the complex and emotionally intense issues of symptom management and palliative care in the setting of serious and potentially life-threatening illness.Greater than 50%  of this time was spent counseling and coordinating care related to the above assessment and plan.  Signed by: Altha Harm, PhD, NP-C

## 2020-04-05 NOTE — Progress Notes (Signed)
PROGRESS NOTE    Tamara [name]  DXA:[id number] DOB: [birth year] DOA: 04/03/2020 PCP: [facility]   Brief Narrative:  Tamara [name] is a 49 y.o. female with medical history significant for hypothyroid congenital, mild intellectual disability, schizophrenia, presented to the emergency department acutely and agitated with screaming. Patient was emergently IVC due to concern for acute psychosis.  While EKG leads were being place, left breast mass was discovered. Ultrasound showed mass concerning for malignancy.  Patient admitted for further inpatient work-up of left breast mass.   Assessment & Plan:   Left breast mass: Concerning for malignancy -ultrasound of left breast shows large solid mass at 1 o'clock position concerning for malignancy.  Recommended mammogram.   -Diagnostic mammogram is pending -Appreciate general surgery recommendations. -Consulted oncology-appreciate recommendations -Reviewed CT chest/abdomen/pelvis shows: Large left breast mass is identified compatible with primary breast carcinoma.  This extends posteriorly to the pectoralis major and extending anteriorly to the skin surface.  Cannot exclude chest wall involvement.  Enlarged left axillary and left retropectoral and left internal mammary lymph nodes compatible with metastatic adenopathy. -Family meeting with oncology this afternoon. -Plan is for ultrasound-guided core biopsy likely tomorrow am.  Congenital hypothyroidism-resumed levothyroxine  Hyponatremia: Chronic.  Sodium is improving from 1 25-1 31 this morning. -patient is asymptomatic.  Check urine sodium.  Repeat BMP tomorrow a.m.  Leukocytosis: Resolved  Hypokalemia: Replenished.  Magnesium level: WNL  Schizophrenia - 1:1 observation ordered -Continued home meds-Depakote, Haldol, hydroxyzine. -Appreciate psych input-recommend no   Mild intellectual disability-appears at baseline  DVT prophylaxis: Lovenox Code Status: Full  code Family Communication:  None present at bedside.  Plan of care discussed with patient in length and she verbalized understanding and agreed with it. Disposition Plan: Pending general surgery evaluation  Consultants:  General surgery Oncology Palliative care  Procedures:   Breast ultrasound  CT chest/abdomen/pelvis  Antimicrobials:   None  Status is: Inpatient   Dispo: The patient is from: Home              Anticipated d/c is to: home?              Anticipated d/c date is: More than 3 days              Patient currently not medically stable for the discharge.    Subjective: Patient seen and examined.  Sleeping comfortably on the bed.  She denies any complaints including pain in her left breast, nipple discharge, decreased appetite, weakness or lethargy.  Objective: Vitals:   04/04/20 1104 04/05/20 0014 04/05/20 0902 04/05/20 1204  BP: 99/66 (!) 108/56 117/67 122/79  Pulse: 93 90 94 (!) 107  Resp: 16 18 18 18   Temp:  98 F (36.7 C) 98.5 F (36.9 C) 98.8 F (37.1 C)  TempSrc:   Oral Oral  SpO2: 99% 99% 98% 100%  Weight:      Height:        Intake/Output Summary (Last 24 hours) at 04/05/2020 1257 Last data filed at 04/05/2020 1240 Gross per 24 hour  Intake 720 ml  Output --  Net 720 ml   Filed Weights   04/03/20 1523  Weight: 63.5 kg    Examination:  General exam: Appears calm and comfortable Respiratory system: Clear to auscultation. Respiratory effort normal. Cardiovascular system: S1 & S2 heard, RRR. No JVD, murmurs, rubs, gallops or clicks. No pedal edema. Chest: Left breast: About 6 x 7 cm in size firm mass noted in left breast.  Warm to  touch, mildly erythematous, nipple everted.  No nipple discharge seen. Gastrointestinal system: Abdomen is nondistended, soft and nontender. No organomegaly or masses felt. Normal bowel sounds heard. Central nervous system: Alert and oriented. No focal neurological deficits. Extremities: Symmetric 5 x 5  power.    Data Reviewed: I have personally reviewed following labs and imaging studies  CBC: Recent Labs  Lab 04/03/20 1540 04/05/20 0400  WBC 12.1* 7.6  NEUTROABS 9.2*  --   HGB 12.5 11.3*  HCT 35.8* 32.4*  MCV 87.5 89.3  PLT 342 845   Basic Metabolic Panel: Recent Labs  Lab 04/03/20 1540 04/05/20 0400  NA 125* 131*  K 3.1* 3.9  CL 88* 97*  CO2 24 26  GLUCOSE 153* 88  BUN <5* <5*  CREATININE 0.65 0.48  CALCIUM 9.5 9.1  MG  --  2.1   GFR: Estimated Creatinine Clearance: 73.4 mL/min (by C-G formula based on SCr of 0.48 mg/dL). Liver Function Tests: Recent Labs  Lab 04/03/20 1540  AST 25  ALT 13  ALKPHOS 68  BILITOT 0.4  PROT 8.6*  ALBUMIN 4.2   No results for input(s): LIPASE, AMYLASE in the last 168 hours. No results for input(s): AMMONIA in the last 168 hours. Coagulation Profile: No results for input(s): INR, PROTIME in the last 168 hours. Cardiac Enzymes: No results for input(s): CKTOTAL, CKMB, CKMBINDEX, TROPONINI in the last 168 hours. BNP (last 3 results) No results for input(s): PROBNP in the last 8760 hours. HbA1C: No results for input(s): HGBA1C in the last 72 hours. CBG: No results for input(s): GLUCAP in the last 168 hours. Lipid Profile: No results for input(s): CHOL, HDL, LDLCALC, TRIG, CHOLHDL, LDLDIRECT in the last 72 hours. Thyroid Function Tests: No results for input(s): TSH, T4TOTAL, FREET4, T3FREE, THYROIDAB in the last 72 hours. Anemia Panel: No results for input(s): VITAMINB12, FOLATE, FERRITIN, TIBC, IRON, RETICCTPCT in the last 72 hours. Sepsis Labs: No results for input(s): PROCALCITON, LATICACIDVEN in the last 168 hours.  Recent Results (from the past 240 hour(s))  Respiratory Panel by RT PCR (Flu A&B, Covid) - Nasopharyngeal Swab     Status: None   Collection Time: 04/03/20  3:43 PM   Specimen: Nasopharyngeal Swab  Result Value Ref Range Status   SARS Coronavirus 2 by RT PCR NEGATIVE NEGATIVE Final    Comment:  (NOTE) SARS-CoV-2 target nucleic acids are NOT DETECTED.  The SARS-CoV-2 RNA is generally detectable in upper respiratoy specimens during the acute phase of infection. The lowest concentration of SARS-CoV-2 viral copies this assay can detect is 131 copies/mL. A negative result does not preclude SARS-Cov-2 infection and should not be used as the sole basis for treatment or other patient management decisions. A negative result may occur with  improper specimen collection/handling, submission of specimen other than nasopharyngeal swab, presence of viral mutation(s) within the areas targeted by this assay, and inadequate number of viral copies (<131 copies/mL). A negative result must be combined with clinical observations, patient history, and epidemiological information. The expected result is Negative.  Fact Sheet for Patients:  PinkCheek.be  Fact Sheet for Healthcare Providers:  GravelBags.it  This test is no t yet approved or cleared by the [place] FDA and  has been authorized for detection and/or diagnosis of SARS-CoV-2 by FDA under an Emergency Use Authorization (EUA). This EUA will remain  in effect (meaning this test can be used) for the duration of the COVID-19 declaration under Section 564(b)(1) of the Act, 21 U.S.C. section 360bbb-3(b)(1), unless the  authorization is terminated or revoked sooner.     Influenza A by PCR NEGATIVE NEGATIVE Final   Influenza B by PCR NEGATIVE NEGATIVE Final    Comment: (NOTE) The Xpert Xpress SARS-CoV-2/FLU/RSV assay is intended as an aid in  the diagnosis of influenza from Nasopharyngeal swab specimens and  should not be used as a sole basis for treatment. Nasal washings and  aspirates are unacceptable for Xpert Xpress SARS-CoV-2/FLU/RSV  testing.  Fact Sheet for Patients: PinkCheek.be  Fact Sheet for Healthcare  Providers: GravelBags.it  This test is not yet approved or cleared by the [place] FDA and  has been authorized for detection and/or diagnosis of SARS-CoV-2 by  FDA under an Emergency Use Authorization (EUA). This EUA will remain  in effect (meaning this test can be used) for the duration of the  Covid-19 declaration under Section 564(b)(1) of the Act, 21  U.S.C. section 360bbb-3(b)(1), unless the authorization is  terminated or revoked. Performed at [facility], [address], [place], [place] [address]       Radiology Studies: CT CHEST ABDOMEN PELVIS W CONTRAST  Result Date: 04/04/2020 CLINICAL DATA:  Left breast mass identified recently. Evaluate for metastatic disease. EXAM: CT CHEST, ABDOMEN, AND PELVIS WITH CONTRAST TECHNIQUE: Multidetector CT imaging of the chest, abdomen and pelvis was performed following the standard protocol during bolus administration of intravenous contrast. CONTRAST:  19mL OMNIPAQUE IOHEXOL 300 MG/ML  SOLN COMPARISON:  None. FINDINGS: CT CHEST FINDINGS Cardiovascular: The heart size is within normal limits. No pericardial effusion identified. Mediastinum/Nodes: Normal appearance of the thyroid gland. The trachea appears patent and is midline. Normal appearance of the esophagus. Multiple enlarged left axillary lymph nodes are identified. Index node measures 2.1 cm, image 18/4. Adjacent left axillary lymph node measures 2.8 cm, image 22/4. Enlarged left retropectoral lymph node measures 1.5 cm, image 13/4. Prominent left internal mammary lymph nodes are identified. The largest measures 9 mm short axis, image 21/4. Left supraclavicular lymph node measures 1 cm, image 9/4. No enlarged right axillary or supraclavicular lymph nodes. No enlarged mediastinal or hilar lymph nodes. Lungs/Pleura: No pleural effusion. Patchy nonspecific areas of ground-glass attenuation and subsegmental atelectasis noted within the lung bases. No  suspicious solid lung nodules identified to suggest pulmonary metastases. Musculoskeletal: Large left breast mass is noted measuring 8.4 x 6.8 cm, image 24/4. This extends posteriorly to the pectoralis major muscle and extends anteriorly to the skin surface. There is overlying skin thickening noted. Cannot exclude chest wall involvement. CT ABDOMEN PELVIS FINDINGS Hepatobiliary: No focal liver abnormality is seen. No gallstones, gallbladder wall thickening, or biliary dilatation. Pancreas: Unremarkable. No pancreatic ductal dilatation or surrounding inflammatory changes. Spleen: Normal in size without focal abnormality. Adrenals/Urinary Tract: The adrenal glands appear normal. No kidney mass identified bilaterally. Moderate distension of the urinary bladder. No focal bladder abnormality. Stomach/Bowel: Stomach is within normal limits. Appendix appears normal. No evidence of bowel wall thickening, distention, or inflammatory changes. Vascular/Lymphatic: Normal appearance of the abdominal aorta. No abdominopelvic adenopathy. Reproductive: Multiple enhancing pelvic masses are identified which appear closely associated with the uterus. Some of these likely represent uterine fibroids. Within the left pelvis there is a mixed attenuation mass which measures 3.9 x 3.2 by 2.8 cm. This contains a focal area of fat attenuation measuring 1.2 cm, image 78/8. Other: There is no free fluid identified within the abdomen or pelvis. No focal fluid collections identified. Increased soft tissue stranding S is identified within the left upper quadrant peritoneal fat, image 63/4.  No discrete peritoneal nodule or mass noted however. Musculoskeletal: No acute or significant osseous findings. IMPRESSION: 1. Large left breast mass is identified compatible with primary breast carcinoma. This extends posteriorly to the pectoralis major muscle and extends anteriorly to the skin surface. Cannot exclude chest wall involvement. 2. Enlarged left  axillary, left retropectoral, and left internal mammary lymph nodes compatible with metastatic adenopathy. 3. Soft tissue stranding within the left upper quadrant of the abdomen is identified. There is also mild soft tissue stranding is in the omentum. No discrete measurable nodule identified. Although not diagnostic for peritoneal disease, given the extent of tumor involvement in the left breast and left axilla early peritoneal disease cannot be excluded. 4. Multiple enhancing pelvic masses are identified which appear closely associated with the uterus. Some of these likely represent uterine fibroids. Within the left pelvis there is a mixed attenuation mass which contains a focal area of fat attenuation. 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 Advise further evaluation with pelvic sonogram for more definitive characterization. Electronically Signed   By: Kerby Moors M.D.   On: 04/04/2020 19:28   US BREAST LTD UNI LEFT INC AXILLA  Result Date: 04/03/2020 CLINICAL DATA:  Patient presents with redness of the left breast and a large left breast mass. The patient was evaluated through the emergency department for evaluation of possible abscess. EXAM: ULTRASOUND OF THE LEFT BREAST COMPARISON:  None. FINDINGS: On physical exam, there is a large mass within the left breast. Targeted ultrasound is performed, showing a 6.3 x 6.2 x 7.1 cm lobular hypoechoic solid mass left breast 1 o'clock position (upper-outer quadrant). Multiple abnormal cortically thickened left axillary lymph nodes are demonstrated measuring up to 2.6 cm with effacement of the fatty hilum. IMPRESSION: 1. Large solid mass left breast 1 o'clock position concerning for malignancy. 2. Multiple abnormal cortically thickened left axillary lymph nodes concerning for metastatic adenopathy. RECOMMENDATION: 1. Recommend evaluation of the right and left breast with dedicated mammography and ultrasound at a dedicated breast center. This will  allow for comprehensive evaluation given the concern of left breast malignancy. 2. Ultimately patient will need at least a biopsy of the large palpable left breast mass and 1 of the left axillary lymph nodes. BI-RADS CATEGORY  5: Highly suggestive of malignancy. These results were called by telephone at the time of interpretation on 04/03/2020 at 4:33 pm to provider Dr. Charna Archer, who verbally acknowledged these results. Electronically Signed   By: Lovey Newcomer M.D.   On: 04/03/2020 16:51    Scheduled Meds: . benztropine  0.5 mg Oral BID  . divalproex  500 mg Oral Q12H  . enoxaparin (LOVENOX) injection  40 mg Subcutaneous Q24H  . haloperidol  5 mg Oral TID  . hydrOXYzine  25 mg Oral TID  . levothyroxine  88 mcg Oral Q0600  . loratadine  10 mg Oral Daily   Continuous Infusions:   LOS: 1 day   Time spent: 40 minutes   Tamara Mccranie Loann Quill, MD [facility]  If 7PM-7AM, please contact night-coverage [email or web address] 04/05/2020, 12:57 PM

## 2020-04-05 NOTE — Progress Notes (Signed)
Pt. has order for mammogram. I reached out to [facility] and they said they will do it outpatient because that is not related to patient inpatient stay. Attending MD (Dr. Doristine Bosworth) was made aware via secure chat.

## 2020-04-06 ENCOUNTER — Ambulatory Visit
Admit: 2020-04-06 | Discharge: 2020-04-06 | Disposition: A | Discharge status: Home / Self Care | Payer: Medicaid Other | Attending: Internal Medicine | Admitting: Internal Medicine

## 2020-04-06 ENCOUNTER — Other Ambulatory Visit: Payer: Self-pay

## 2020-04-06 ENCOUNTER — Inpatient Hospital Stay
Admit: 2020-04-06 | Discharge: 2020-04-06 | Disposition: A | Discharge status: Home / Self Care | Payer: Medicaid Other | Attending: Internal Medicine | Admitting: Internal Medicine

## 2020-04-06 ENCOUNTER — Ambulatory Visit: Payer: Medicaid Other

## 2020-04-06 ENCOUNTER — Inpatient Hospital Stay: Payer: Medicaid Other

## 2020-04-06 ENCOUNTER — Telehealth: Payer: Self-pay | Admitting: Internal Medicine

## 2020-04-06 DIAGNOSIS — F70 Mild intellectual disabilities: Secondary | ICD-10-CM | POA: Diagnosis not present

## 2020-04-06 DIAGNOSIS — Z515 Encounter for palliative care: Secondary | ICD-10-CM | POA: Diagnosis not present

## 2020-04-06 DIAGNOSIS — N6321 Unspecified lump in the left breast, upper outer quadrant: Secondary | ICD-10-CM

## 2020-04-06 DIAGNOSIS — N63 Unspecified lump in unspecified breast: Secondary | ICD-10-CM

## 2020-04-06 LAB — URINALYSIS, COMPLETE (UACMP) WITH MICROSCOPIC
Bilirubin Urine: NEGATIVE
Glucose, UA: NEGATIVE mg/dL
Ketones, ur: NEGATIVE mg/dL
Nitrite: NEGATIVE
Protein, ur: 30 mg/dL — AB
Specific Gravity, Urine: 1.008 (ref 1.005–1.030)
pH: 5 (ref 5.0–8.0)

## 2020-04-06 LAB — CBC
HCT: 35.6 % — ABNORMAL LOW (ref 36.0–46.0)
Hemoglobin: 12.3 g/dL (ref 12.0–15.0)
MCH: 30.8 pg (ref 26.0–34.0)
MCHC: 34.6 g/dL (ref 30.0–36.0)
MCV: 89 fL (ref 80.0–100.0)
Platelets: 322 10*3/uL (ref 150–400)
RBC: 4 MIL/uL (ref 3.87–5.11)
RDW: 13.1 % (ref 11.5–15.5)
WBC: 17.1 10*3/uL — ABNORMAL HIGH (ref 4.0–10.5)
nRBC: 0 % (ref 0.0–0.2)

## 2020-04-06 LAB — BASIC METABOLIC PANEL
Anion gap: 8 (ref 5–15)
BUN: <5 mg/dL — ABNORMAL LOW (ref 6–20)
CO2: 27 mmol/L (ref 22–32)
Calcium: 9.1 mg/dL (ref 8.9–10.3)
Chloride: 92 mmol/L — ABNORMAL LOW (ref 98–111)
Creatinine, Ser: 0.6 mg/dL (ref 0.44–1.00)
GFR, Estimated: >60 mL/min (ref >60)
Glucose, Bld: 108 mg/dL — ABNORMAL HIGH (ref 70–99)
Potassium: 3.5 mmol/L (ref 3.5–5.1)
Sodium: 127 mmol/L — ABNORMAL LOW (ref 135–145)

## 2020-04-06 LAB — PROCALCITONIN: Procalcitonin: <0.1 ng/mL

## 2020-04-06 LAB — LACTIC ACID, PLASMA: Lactic Acid, Venous: 1.8 mmol/L (ref 0.5–1.9)

## 2020-04-06 MED ORDER — MORPHINE SULFATE (PF) 2 MG/ML IV SOLN: 2.0000 mg | INTRAVENOUS | Status: DC | PRN

## 2020-04-06 MED ORDER — PIPERACILLIN-TAZOBACTAM 3.375 G IVPB
3.3750 g | Freq: Three times a day (TID) | INTRAVENOUS | Status: DC
  Administered 2020-04-06 – 2020-04-09 (×9): 3.375 g via INTRAVENOUS
  Filled 2020-04-06 (×9): qty 50

## 2020-04-06 MED ORDER — SODIUM CHLORIDE 0.9 % IV SOLN: INTRAVENOUS | Status: DC

## 2020-04-06 MED ORDER — ACETAMINOPHEN 650 MG RE SUPP
650.0000 mg | Freq: Four times a day (QID) | RECTAL | Status: DC | PRN
  Administered 2020-04-06: 650 mg via RECTAL
  Filled 2020-04-06: qty 1

## 2020-04-06 NOTE — Progress Notes (Signed)
PROGRESS NOTE    Tamara [name]  QQP:[id number] DOB: 06-30-1970 DOA: 04/03/2020 PCP: [facility]   Brief Narrative:  Tamara [name] is a 49 y.o. female with medical history significant for hypothyroid congenital, mild intellectual disability, schizophrenia, presented to the emergency department acutely and agitated with screaming. Patient was emergently IVC due to concern for acute psychosis.  While EKG leads were being place, left breast mass was discovered. Ultrasound showed mass concerning for malignancy.  Patient admitted for further inpatient work-up of left breast mass.  Assessment & Plan:   Left breast mass: Concerning for malignancy -S/p ultrasound-guided core needle biopsy of left breast mass and axillary lymph node by IR on 11/12.  Patient tolerated procedure very well. -ultrasound of left breast shows large solid mass at 1 o'clock position concerning for malignancy.  -Reviewed CT chest/abdomen/pelvis shows: Large left breast mass is identified compatible with primary breast carcinoma.  This extends posteriorly to the pectoralis major and extending anteriorly to the skin surface.  Cannot exclude chest wall involvement.  Enlarged left axillary and left retropectoral and left internal mammary lymph nodes compatible with metastatic adenopathy. -Appreciate general surgery recommendations-no surgical management at this time.  -Appreciate oncology recommendations.  SIRS: -Unknown source -Patient has an fever of 100.6, tachycardic, has leukocytosis of 17.1, lactic acid: WNL.  Blood culture obtained and is pending.  -Check UA, chest x-ray, PCT -Start empirically on IV antibiotics-Zosyn.  Start Tylenol as needed for fever more than 100.6.  Schizophrenia - 1:1 observation ordered -Continued home meds-Depakote, Haldol, hydroxyzine. -Appreciate psych input  Hyponatremia: Chronic.  -patient is asymptomatic.   -Urine sodium: WNL.  Continue to monitor.  Congenital  hypothyroidism-resume levothyroxine  Hypokalemia: Replenished.  Magnesium level: WNL  Mild intellectual disability-appears at baseline  DVT prophylaxis: Lovenox Code Status: Full code Family Communication:  None present at bedside.  Plan of care discussed with patient in length and she verbalized understanding and agreed with it.  Called patient's sister Tamara [name] and discussed plan of care and she verbalized understanding.  Disposition Plan: Home  Consultants:  General surgery Oncology Palliative care  Procedures:   Breast ultrasound  CT chest/abdomen/pelvis  Ultrasound guided core biopsy of left breast mass and axillary lymph node.  Antimicrobials:   zosyn  Status is: Inpatient  Dispo: The patient is from: Home              Anticipated d/c is to: home?              Anticipated d/c date is: More than 3 days              Patient currently not medically stable for the discharge.   Subjective: Patient seen and examined.  Appears very weak, tired and drowsy after the procedure.  She denies pain, bleeding from biopsy site.  Objective: Vitals:   04/06/20 0001 04/06/20 0520 04/06/20 0736 04/06/20 1503  BP: 124/71 139/78 113/69 113/74  Pulse: (!) 117 (!) 115 (!) 118 (!) 121  Resp: _0 Temp: 99.5 F (37.5 C) 99.7 F (37.6 C) 99.2 F (37.3 C) 100.3 F (37.9 C)  TempSrc:      SpO2: 99% 98% 99% 99%  Weight:      Height:        Intake/Output Summary (Last 24 hours) at 04/06/2020 1513 Last data filed at 04/06/2020 0523 Gross per 24 hour  Intake 120 ml  Output 1000 ml  Net -880 ml   Filed Weights   04/03/20  1523  Weight: 63.5 kg    Examination:  General exam: Appears calm and comfortable, appears weak and drowsy, on room air Respiratory system: Clear to auscultation. Respiratory effort normal. Cardiovascular system: S1 & S2 heard, RRR. No JVD, murmurs, rubs, gallops or clicks. No pedal edema. Chest: Left breast: Left breast mass noted, very firm,  erythematous, Band-Aid noted.  No signs of active bleeding or discharge seen. Gastrointestinal system: Abdomen is nondistended, soft and nontender. No organomegaly or masses felt. Normal bowel sounds heard. Central nervous system: Alert and communicating well Extremities: Symmetric 5 x 5 power.  Data Reviewed: I have personally reviewed following labs and imaging studies  CBC: Recent Labs  Lab 04/03/20 1540 04/05/20 0400 04/06/20 0434  WBC 12.1* 7.6 17.1*  NEUTROABS 9.2*  --   --   HGB 12.5 11.3* 12.3  HCT 35.8* 32.4* 35.6*  MCV 87.5 89.3 89.0  PLT 342 297 295   Basic Metabolic Panel: Recent Labs  Lab 04/03/20 1540 04/05/20 0400 04/06/20 0434  NA 125* 131* 127*  K 3.1* 3.9 3.5  CL 88* 97* 92*  CO2 _0 GLUCOSE 153* 88 108*  BUN <5* <5* <5*  CREATININE 0.65 0.48 0.60  CALCIUM 9.5 9.1 9.1  MG  --  2.1  --    GFR: Estimated Creatinine Clearance: 73.4 mL/min (by C-G formula based on SCr of 0.6 mg/dL). Liver Function Tests: Recent Labs  Lab 04/03/20 1540  AST 25  ALT 13  ALKPHOS 68  BILITOT 0.4  PROT 8.6*  ALBUMIN 4.2   No results for input(s): LIPASE, AMYLASE in the last 168 hours. No results for input(s): AMMONIA in the last 168 hours. Coagulation Profile: No results for input(s): INR, PROTIME in the last 168 hours. Cardiac Enzymes: No results for input(s): CKTOTAL, CKMB, CKMBINDEX, TROPONINI in the last 168 hours. BNP (last 3 results) No results for input(s): PROBNP in the last 8760 hours. HbA1C: No results for input(s): HGBA1C in the last 72 hours. CBG: No results for input(s): GLUCAP in the last 168 hours. Lipid Profile: No results for input(s): CHOL, HDL, LDLCALC, TRIG, CHOLHDL, LDLDIRECT in the last 72 hours. Thyroid Function Tests: No results for input(s): TSH, T4TOTAL, FREET4, T3FREE, THYROIDAB in the last 72 hours. Anemia Panel: No results for input(s): VITAMINB12, FOLATE, FERRITIN, TIBC, IRON, RETICCTPCT in the last 72 hours. Sepsis  Labs: Recent Labs  Lab 04/06/20 0950  LATICACIDVEN 1.8    Recent Results (from the past 240 hour(s))  Respiratory Panel by RT PCR (Flu A&B, Covid) - Nasopharyngeal Swab     Status: None   Collection Time: 04/03/20  3:43 PM   Specimen: Nasopharyngeal Swab  Result Value Ref Range Status   SARS Coronavirus 2 by RT PCR NEGATIVE NEGATIVE Final    Comment: (NOTE) SARS-CoV-2 target nucleic acids are NOT DETECTED.  The SARS-CoV-2 RNA is generally detectable in upper respiratoy specimens during the acute phase of infection. The lowest concentration of SARS-CoV-2 viral copies this assay can detect is 131 copies/mL. A negative result does not preclude SARS-Cov-2 infection and should not be used as the sole basis for treatment or other patient management decisions. A negative result may occur with  improper specimen collection/handling, submission of specimen other than nasopharyngeal swab, presence of viral mutation(s) within the areas targeted by this assay, and inadequate number of viral copies (<131 copies/mL). A negative result must be combined with clinical observations, patient history, and epidemiological information. The expected result is Negative.  Fact Sheet for  Patients:  PinkCheek.be  Fact Sheet for Healthcare Providers:  GravelBags.it  This test is no t yet approved or cleared by the [place] FDA and  has been authorized for detection and/or diagnosis of SARS-CoV-2 by FDA under an Emergency Use Authorization (EUA). This EUA will remain  in effect (meaning this test can be used) for the duration of the COVID-19 declaration under Section 564(b)(1) of the Act, 21 U.S.C. section 360bbb-3(b)(1), unless the authorization is terminated or revoked sooner.     Influenza A by PCR NEGATIVE NEGATIVE Final   Influenza B by PCR NEGATIVE NEGATIVE Final    Comment: (NOTE) The Xpert Xpress SARS-CoV-2/FLU/RSV assay is  intended as an aid in  the diagnosis of influenza from Nasopharyngeal swab specimens and  should not be used as a sole basis for treatment. Nasal washings and  aspirates are unacceptable for Xpert Xpress SARS-CoV-2/FLU/RSV  testing.  Fact Sheet for Patients: PinkCheek.be  Fact Sheet for Healthcare Providers: GravelBags.it  This test is not yet approved or cleared by the [place] FDA and  has been authorized for detection and/or diagnosis of SARS-CoV-2 by  FDA under an Emergency Use Authorization (EUA). This EUA will remain  in effect (meaning this test can be used) for the duration of the  Covid-19 declaration under Section 564(b)(1) of the Act, 21  U.S.C. section 360bbb-3(b)(1), unless the authorization is  terminated or revoked. Performed at [facility], [address], [place], [place] [address]       Radiology Studies: CT CHEST ABDOMEN PELVIS W CONTRAST  Result Date: 04/04/2020 CLINICAL DATA:  Left breast mass identified recently. Evaluate for metastatic disease. EXAM: CT CHEST, ABDOMEN, AND PELVIS WITH CONTRAST TECHNIQUE: Multidetector CT imaging of the chest, abdomen and pelvis was performed following the standard protocol during bolus administration of intravenous contrast. CONTRAST:  131m OMNIPAQUE IOHEXOL 300 MG/ML  SOLN COMPARISON:  None. FINDINGS: CT CHEST FINDINGS Cardiovascular: The heart size is within normal limits. No pericardial effusion identified. Mediastinum/Nodes: Normal appearance of the thyroid gland. The trachea appears patent and is midline. Normal appearance of the esophagus. Multiple enlarged left axillary lymph nodes are identified. Index node measures 2.1 cm, image 18/4. Adjacent left axillary lymph node measures 2.8 cm, image 22/4. Enlarged left retropectoral lymph node measures 1.5 cm, image 13/4. Prominent left internal mammary lymph nodes are identified. The largest measures 9 mm  short axis, image 21/4. Left supraclavicular lymph node measures 1 cm, image 9/4. No enlarged right axillary or supraclavicular lymph nodes. No enlarged mediastinal or hilar lymph nodes. Lungs/Pleura: No pleural effusion. Patchy nonspecific areas of ground-glass attenuation and subsegmental atelectasis noted within the lung bases. No suspicious solid lung nodules identified to suggest pulmonary metastases. Musculoskeletal: Large left breast mass is noted measuring 8.4 x 6.8 cm, image 24/4. This extends posteriorly to the pectoralis major muscle and extends anteriorly to the skin surface. There is overlying skin thickening noted. Cannot exclude chest wall involvement. CT ABDOMEN PELVIS FINDINGS Hepatobiliary: No focal liver abnormality is seen. No gallstones, gallbladder wall thickening, or biliary dilatation. Pancreas: Unremarkable. No pancreatic ductal dilatation or surrounding inflammatory changes. Spleen: Normal in size without focal abnormality. Adrenals/Urinary Tract: The adrenal glands appear normal. No kidney mass identified bilaterally. Moderate distension of the urinary bladder. No focal bladder abnormality. Stomach/Bowel: Stomach is within normal limits. Appendix appears normal. No evidence of bowel wall thickening, distention, or inflammatory changes. Vascular/Lymphatic: Normal appearance of the abdominal aorta. No abdominopelvic adenopathy. Reproductive: Multiple enhancing pelvic masses are identified which  appear closely associated with the uterus. Some of these likely represent uterine fibroids. Within the left pelvis there is a mixed attenuation mass which measures 3.9 x 3.2 by 2.8 cm. This contains a focal area of fat attenuation measuring 1.2 cm, image 78/8. Other: There is no free fluid identified within the abdomen or pelvis. No focal fluid collections identified. Increased soft tissue stranding S is identified within the left upper quadrant peritoneal fat, image 63/4. No discrete peritoneal  nodule or mass noted however. Musculoskeletal: No acute or significant osseous findings. IMPRESSION: 1. Large left breast mass is identified compatible with primary breast carcinoma. This extends posteriorly to the pectoralis major muscle and extends anteriorly to the skin surface. Cannot exclude chest wall involvement. 2. Enlarged left axillary, left retropectoral, and left internal mammary lymph nodes compatible with metastatic adenopathy. 3. Soft tissue stranding within the left upper quadrant of the abdomen is identified. There is also mild soft tissue stranding is in the omentum. No discrete measurable nodule identified. Although not diagnostic for peritoneal disease, given the extent of tumor involvement in the left breast and left axilla early peritoneal disease cannot be excluded. 4. Multiple enhancing pelvic masses are identified which appear closely associated with the uterus. Some of these likely represent uterine fibroids. Within the left pelvis there is a mixed attenuation mass which contains a focal area of fat attenuation. 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 Advise further evaluation with pelvic sonogram for more definitive characterization. Electronically Signed   By: Kerby Moors M.D.   On: 04/04/2020 19:28   [place] LT BREAST BX W LOC DEV 1ST LESION IMG BX SPEC US GUIDE  Result Date: 04/06/2020 CLINICAL DATA:  Left breast 1 o'clock large mass. EXAM: ULTRASOUND GUIDED LEFT BREAST CORE NEEDLE BIOPSY COMPARISON:  Previous exam(s). PROCEDURE: I met with the patient and we discussed the procedure of ultrasound-guided biopsy, including benefits and alternatives. We discussed the high likelihood of a successful procedure. We discussed the risks of the procedure, including infection, bleeding, tissue injury, clip migration, and inadequate sampling. Informed written consent was given. The usual time-out protocol was performed immediately prior to the procedure. Lesion quadrant: All  quadrants of the breast. Using sterile technique and 1% Lidocaine as local anesthetic, under direct ultrasound visualization, a 14 gauge spring-loaded device was used to perform biopsy of left breast 1 o'clock mass using a lateral approach. At the conclusion of the procedure vision tissue marker clip was deployed into the biopsy cavity. Next, using sterile technique and 1% Lidocaine as local anesthetic, under direct ultrasound visualization, a 14 gauge spring-loaded device was used to perform biopsy of left axillary lymph node using a lateral approach. At the conclusion of the procedure shaped 3 HydroMARK tissue marker clip was deployed into the biopsy cavity Follow up 2 view mammogram was not performed as the patient was sedated and unsafe to stand up. IMPRESSION: Ultrasound guided biopsy of large left breast mass and an abnormal left axillary lymph node. No apparent complications. Electronically Signed   By: Fidela Salisbury M.D.   On: 04/06/2020 10:17   [place] LT BREAST BX W LOC DEV EA ADD LESION IMG BX SPEC US GUIDE  Result Date: 04/06/2020 CLINICAL DATA:  Left breast 1 o'clock large mass. EXAM: ULTRASOUND GUIDED LEFT BREAST CORE NEEDLE BIOPSY COMPARISON:  Previous exam(s). PROCEDURE: I met with the patient and we discussed the procedure of ultrasound-guided biopsy, including benefits and alternatives. We discussed the high likelihood of a successful procedure. We discussed  the risks of the procedure, including infection, bleeding, tissue injury, clip migration, and inadequate sampling. Informed written consent was given. The usual time-out protocol was performed immediately prior to the procedure. Lesion quadrant: All quadrants of the breast. Using sterile technique and 1% Lidocaine as local anesthetic, under direct ultrasound visualization, a 14 gauge spring-loaded device was used to perform biopsy of left breast 1 o'clock mass using a lateral approach. At the conclusion of the procedure vision tissue  marker clip was deployed into the biopsy cavity. Next, using sterile technique and 1% Lidocaine as local anesthetic, under direct ultrasound visualization, a 14 gauge spring-loaded device was used to perform biopsy of left axillary lymph node using a lateral approach. At the conclusion of the procedure shaped 3 HydroMARK tissue marker clip was deployed into the biopsy cavity Follow up 2 view mammogram was not performed as the patient was sedated and unsafe to stand up. IMPRESSION: Ultrasound guided biopsy of large left breast mass and an abnormal left axillary lymph node. No apparent complications. Electronically Signed   By: Fidela Salisbury M.D.   On: 04/06/2020 10:17    Scheduled Meds: . benztropine  0.5 mg Oral BID  . divalproex  500 mg Oral Q12H  . enoxaparin (LOVENOX) injection  40 mg Subcutaneous Q24H  . haloperidol  5 mg Oral TID  . hydrOXYzine  25 mg Oral TID  . levothyroxine  88 mcg Oral Q0600  . loratadine  10 mg Oral Daily   Continuous Infusions:   LOS: 2 days   Time spent: 40 minutes    Loann Quill, MD [facility]  If 7PM-7AM, please contact night-coverage [email or web address] 04/06/2020, 3:13 PM

## 2020-04-06 NOTE — Progress Notes (Signed)
Per Dr. Diamantina Monks, give Geodon in addition to Lorazepam. Pt has no signs of acute distress.

## 2020-04-06 NOTE — Progress Notes (Signed)
Tamara [name]   DOB:1970-12-02   FY#:[id number]    Subjective: Patient noted to have tachycardia and also temperature of 100.3.  Pt denies any pain.  Patient on room air.  Objective:  Vitals:   04/06/20 1805 04/06/20 1815  BP: 105/64 105/64  Pulse: (!) 106 (!) 106  Resp: 20 20  Temp: 98.7 F (37.1 C) 98.7 F (37.1 C)  SpO2: 99% 99%     Intake/Output Summary (Last 24 hours) at 04/06/2020 2030 Last data filed at 04/06/2020 1930 Gross per 24 hour  Intake 518.61 ml  Output 2000 ml  Net -1481.39 ml    Physical Exam Vitals and nursing note reviewed.  Constitutional:      Comments: Accompanied by sister.   HENT:     Head: Normocephalic and atraumatic.     Mouth/Throat:     Pharynx: No oropharyngeal exudate.  Eyes:     Pupils: Pupils are equal, round, and reactive to light.  Cardiovascular:     Rate and Rhythm: Regular rhythm. Tachycardia present.  Pulmonary:     Effort: Pulmonary effort is normal. No respiratory distress.     Breath sounds: Normal breath sounds. No wheezing.  Abdominal:     General: Bowel sounds are normal. There is no distension.     Palpations: Abdomen is soft. There is no mass.     Tenderness: There is no abdominal tenderness. There is no guarding or rebound.  Musculoskeletal:        General: No tenderness. Normal range of motion.     Cervical back: Normal range of motion and neck supple.  Skin:    General: Skin is warm.  Neurological:     Mental Status: She is alert and oriented to person, place, and time.  Psychiatric:        Mood and Affect: Affect normal.      Labs:  Lab Results  Component Value Date   WBC 17.1 (H) 04/06/2020   HGB 12.3 04/06/2020   HCT 35.6 (L) 04/06/2020   MCV 89.0 04/06/2020   PLT 322 04/06/2020   NEUTROABS 9.2 (H) 04/03/2020    Lab Results  Component Value Date   NA 127 (L) 04/06/2020   K 3.5 04/06/2020   CL 92 (L) 04/06/2020   CO2 27 04/06/2020    Studies:  DG Chest 1 View  Result Date:  04/06/2020 CLINICAL DATA:  Possible pneumonia EXAM: CHEST  1 VIEW COMPARISON:  04/04/2020 FINDINGS: Cardiac shadow is within normal limits. The lungs are well aerated bilaterally. No focal infiltrate or effusion is seen. No bony abnormality is noted. IMPRESSION: No acute abnormality seen. Electronically Signed   By: Inez Catalina M.D.   On: 04/06/2020 16:20   [place] LT BREAST BX W LOC DEV 1ST LESION IMG BX SPEC US GUIDE  Result Date: 04/06/2020 CLINICAL DATA:  Left breast 1 o'clock large mass. EXAM: ULTRASOUND GUIDED LEFT BREAST CORE NEEDLE BIOPSY COMPARISON:  Previous exam(s). PROCEDURE: I met with the patient and we discussed the procedure of ultrasound-guided biopsy, including benefits and alternatives. We discussed the high likelihood of a successful procedure. We discussed the risks of the procedure, including infection, bleeding, tissue injury, clip migration, and inadequate sampling. Informed written consent was given. The usual time-out protocol was performed immediately prior to the procedure. Lesion quadrant: All quadrants of the breast. Using sterile technique and 1% Lidocaine as local anesthetic, under direct ultrasound visualization, a 14 gauge spring-loaded device was used to perform biopsy of left breast 1 o'clock  mass using a lateral approach. At the conclusion of the procedure vision tissue marker clip was deployed into the biopsy cavity. Next, using sterile technique and 1% Lidocaine as local anesthetic, under direct ultrasound visualization, a 14 gauge spring-loaded device was used to perform biopsy of left axillary lymph node using a lateral approach. At the conclusion of the procedure shaped 3 HydroMARK tissue marker clip was deployed into the biopsy cavity Follow up 2 view mammogram was not performed as the patient was sedated and unsafe to stand up. IMPRESSION: Ultrasound guided biopsy of large left breast mass and an abnormal left axillary lymph node. No apparent complications.  Electronically Signed   By: Fidela Salisbury M.D.   On: 04/06/2020 10:17   [place] LT BREAST BX W LOC DEV EA ADD LESION IMG BX SPEC US GUIDE  Result Date: 04/06/2020 CLINICAL DATA:  Left breast 1 o'clock large mass. EXAM: ULTRASOUND GUIDED LEFT BREAST CORE NEEDLE BIOPSY COMPARISON:  Previous exam(s). PROCEDURE: I met with the patient and we discussed the procedure of ultrasound-guided biopsy, including benefits and alternatives. We discussed the high likelihood of a successful procedure. We discussed the risks of the procedure, including infection, bleeding, tissue injury, clip migration, and inadequate sampling. Informed written consent was given. The usual time-out protocol was performed immediately prior to the procedure. Lesion quadrant: All quadrants of the breast. Using sterile technique and 1% Lidocaine as local anesthetic, under direct ultrasound visualization, a 14 gauge spring-loaded device was used to perform biopsy of left breast 1 o'clock mass using a lateral approach. At the conclusion of the procedure vision tissue marker clip was deployed into the biopsy cavity. Next, using sterile technique and 1% Lidocaine as local anesthetic, under direct ultrasound visualization, a 14 gauge spring-loaded device was used to perform biopsy of left axillary lymph node using a lateral approach. At the conclusion of the procedure shaped 3 HydroMARK tissue marker clip was deployed into the biopsy cavity Follow up 2 view mammogram was not performed as the patient was sedated and unsafe to stand up. IMPRESSION: Ultrasound guided biopsy of large left breast mass and an abnormal left axillary lymph node. No apparent complications. Electronically Signed   By: Fidela Salisbury M.D.   On: 04/06/2020 10:17    Large mass of left breast # 49 year old female patient with schizophrenia-currently noted to have a large left breast mass-concerning for malignancy.  # Left breast mass-based on clinical exam imaging-highly  concerning for malignancy.  Given the multiple lymph nodes-at least stage III/locally advanced disease. Await biopsy of the breast/left axilla lymph nodes.   # Schizophrenia-as per recommendations from psychiatry patient-started on Ativan/Geodon prior to procedure.  #Fever tachycardia-agree with infectious work-up; antibiotics as per primary team.   The above plan of care was discussed with the patient's sister at the bedside.  Also discussed with nurse.  Cammie Sickle, MD 04/06/2020  8:30 PM

## 2020-04-06 NOTE — Consult Note (Signed)
Pharmacy Antibiotic Note  Tamara [name] is a 49 y.o. female admitted on 04/03/2020 with SIRS.  Pharmacy has been consulted for Zosyn dosing.  Plan: Zosyn 3.375g Q8H - extended infusion   Height: 5\' 1"  (154.9 cm) Weight: 63.5 kg (140 lb) IBW/kg (Calculated) : 47.8  Temp (24hrs), Avg:99.7 F (37.6 C), Min:98.3 F (36.8 C), Max:100.6 F (38.1 C)  Recent Labs  Lab 04/03/20 1540 04/05/20 0400 04/06/20 0434 04/06/20 0950  WBC 12.1* 7.6 17.1*  --   CREATININE 0.65 0.48 0.60  --   LATICACIDVEN  --   --   --  1.8    Estimated Creatinine Clearance: 73.4 mL/min (by C-G formula based on SCr of 0.6 mg/dL).    No Known Allergies  Antimicrobials this admission: 11/12 Zosyn >>   Dose adjustments this admission:   Microbiology results: BCx:  UCx:  Sputum:  MRSA PCR:   Thank you for allowing pharmacy to be a part of this patient's care.  Rowland Lathe 04/06/2020 3:44 PM

## 2020-04-06 NOTE — Progress Notes (Signed)
[facility]  [phone] [phone]   Name: Tamara [name] Date: 04/06/2020 MRN: [id number]  DOB: May 13, 1971  Patient Care Team: Physicians, [place] as PCP - General    REASON FOR CONSULTATION: Tamara [name] is a 49 y.o. female with multiple medical problems including schizophrenia, intellectual disability, who was brought to the hospital on 04/03/2020 for worsening agitation and was IVC due to acute psychosis.  Patient was incidentally found to have a large left breast mass with imaging concerning for malignancy.  Palliative care was consulted to address goals..    CODE STATUS: Full code  PAST MEDICAL HISTORY: Past Medical History:  Diagnosis Date  . GERD (gastroesophageal reflux disease)   . IUD (intrauterine device) in place IUD was placed in 2016  . Leiomyoma of uterus   . Schizophrenia ([place])   . Thyroid disease     PAST SURGICAL HISTORY: No past surgical history on file.  HEMATOLOGY/ONCOLOGY HISTORY:  Oncology History   No history exists.    ALLERGIES:  has No Known Allergies.  MEDICATIONS:  Current Facility-Administered Medications  Medication Dose Route Frequency Provider Last Rate Last Admin  . acetaminophen (TYLENOL) tablet 650 mg  650 mg Oral Q6H PRN Lang Snow, NP   650 mg at 04/05/20 2220  . benztropine (COGENTIN) tablet 0.5 mg  0.5 mg Oral BID Cox, Amy N, DO   0.5 mg at 04/05/20 2136  . diphenhydrAMINE (BENADRYL) capsule 25 mg  25 mg Oral Q6H PRN Cox, Amy N, DO      . divalproex (DEPAKOTE) DR tablet 500 mg  500 mg Oral Q12H Cox, Amy N, DO   500 mg at 04/05/20 2137  . enoxaparin (LOVENOX) injection 40 mg  40 mg Subcutaneous Q24H Cox, Amy N, DO   40 mg at 04/04/20 2149  . haloperidol (HALDOL) tablet 5 mg  5 mg Oral TID Cox, Amy N, DO   5 mg at 04/05/20 2136  . hydrOXYzine (ATARAX/VISTARIL) tablet 25 mg  25 mg Oral TID Cox, Amy N, DO   25 mg at 04/05/20 2136  .  levothyroxine (SYNTHROID) tablet 88 mcg  88 mcg Oral Q0600 Cox, Amy N, DO   88 mcg at 04/06/20 0517  . loratadine (CLARITIN) tablet 10 mg  10 mg Oral Daily Cox, Amy N, DO   10 mg at 04/05/20 0928  . LORazepam (ATIVAN) injection 2 mg  2 mg Intramuscular PRN Clapacs, Madie Reno, MD   2 mg at 04/06/20 0802  . ziprasidone (GEODON) injection 20 mg  20 mg Intramuscular PRN Clapacs, Madie Reno, MD   20 mg at 04/06/20 0810    VITAL SIGNS: BP 113/69 (BP Location: Right Arm)   Pulse (!) 118   Temp 99.2 F (37.3 C)   Resp 20   Ht _0  (1.549 m)   Wt 140 lb (63.5 kg)   SpO2 99%   BMI 26.45 kg/m  Filed Weights   04/03/20 1523  Weight: 140 lb (63.5 kg)    Estimated body mass index is 26.45 kg/m as calculated from the following:   Height as of this encounter: _1  (1.549 m).   Weight as of this encounter: 140 lb (63.5 kg).  LABS: CBC:    Component Value Date/Time   WBC 17.1 (H) 04/06/2020 0434   HGB 12.3 04/06/2020 0434   HGB 13.4 11/11/2013 2142   HCT 35.6 (L) 04/06/2020 0434   HCT 39.3 11/11/2013 2142  PLT 322 04/06/2020 0434   PLT 295 11/11/2013 2142   MCV 89.0 04/06/2020 0434   MCV 90 11/11/2013 2142   NEUTROABS 9.2 (H) 04/03/2020 1540   LYMPHSABS 1.5 04/03/2020 1540   MONOABS 1.3 (H) 04/03/2020 1540   EOSABS 0.0 04/03/2020 1540   BASOSABS 0.0 04/03/2020 1540   Comprehensive Metabolic Panel:    Component Value Date/Time   NA 127 (L) 04/06/2020 0434   NA 135 (L) 11/14/2013 1309   K 3.5 04/06/2020 0434   K 3.9 11/14/2013 1309   CL 92 (L) 04/06/2020 0434   CL 97 (L) 11/14/2013 1309   CO2 27 04/06/2020 0434   CO2 29 11/14/2013 1309   BUN <5 (L) 04/06/2020 0434   BUN 3 (L) 11/14/2013 1309   CREATININE 0.60 04/06/2020 0434   CREATININE 0.88 11/14/2013 1309   GLUCOSE 108 (H) 04/06/2020 0434   GLUCOSE 117 (H) 11/14/2013 1309   CALCIUM 9.1 04/06/2020 0434   CALCIUM 9.6 11/14/2013 1309   AST 25 04/03/2020 1540   AST 25 11/11/2013 2142   ALT 13 04/03/2020 1540   ALT 18  11/11/2013 2142   ALKPHOS 68 04/03/2020 1540   ALKPHOS 111 11/11/2013 2142   BILITOT 0.4 04/03/2020 1540   BILITOT 0.3 11/11/2013 2142   PROT 8.6 (H) 04/03/2020 1540   PROT 8.1 11/11/2013 2142   ALBUMIN 4.2 04/03/2020 1540   ALBUMIN 3.8 11/11/2013 2142    RADIOGRAPHIC STUDIES: CT CHEST ABDOMEN PELVIS W CONTRAST  Result Date: 04/04/2020 CLINICAL DATA:  Left breast mass identified recently. Evaluate for metastatic disease. EXAM: CT CHEST, ABDOMEN, AND PELVIS WITH CONTRAST TECHNIQUE: Multidetector CT imaging of the chest, abdomen and pelvis was performed following the standard protocol during bolus administration of intravenous contrast. CONTRAST:  162mL OMNIPAQUE IOHEXOL 300 MG/ML  SOLN COMPARISON:  None. FINDINGS: CT CHEST FINDINGS Cardiovascular: The heart size is within normal limits. No pericardial effusion identified. Mediastinum/Nodes: Normal appearance of the thyroid gland. The trachea appears patent and is midline. Normal appearance of the esophagus. Multiple enlarged left axillary lymph nodes are identified. Index node measures 2.1 cm, image 18/4. Adjacent left axillary lymph node measures 2.8 cm, image 22/4. Enlarged left retropectoral lymph node measures 1.5 cm, image 13/4. Prominent left internal mammary lymph nodes are identified. The largest measures 9 mm short axis, image 21/4. Left supraclavicular lymph node measures 1 cm, image 9/4. No enlarged right axillary or supraclavicular lymph nodes. No enlarged mediastinal or hilar lymph nodes. Lungs/Pleura: No pleural effusion. Patchy nonspecific areas of ground-glass attenuation and subsegmental atelectasis noted within the lung bases. No suspicious solid lung nodules identified to suggest pulmonary metastases. Musculoskeletal: Large left breast mass is noted measuring 8.4 x 6.8 cm, image 24/4. This extends posteriorly to the pectoralis major muscle and extends anteriorly to the skin surface. There is overlying skin thickening noted. Cannot  exclude chest wall involvement. CT ABDOMEN PELVIS FINDINGS Hepatobiliary: No focal liver abnormality is seen. No gallstones, gallbladder wall thickening, or biliary dilatation. Pancreas: Unremarkable. No pancreatic ductal dilatation or surrounding inflammatory changes. Spleen: Normal in size without focal abnormality. Adrenals/Urinary Tract: The adrenal glands appear normal. No kidney mass identified bilaterally. Moderate distension of the urinary bladder. No focal bladder abnormality. Stomach/Bowel: Stomach is within normal limits. Appendix appears normal. No evidence of bowel wall thickening, distention, or inflammatory changes. Vascular/Lymphatic: Normal appearance of the abdominal aorta. No abdominopelvic adenopathy. Reproductive: Multiple enhancing pelvic masses are identified which appear closely associated with the uterus. Some of these likely represent uterine fibroids.  Within the left pelvis there is a mixed attenuation mass which measures 3.9 x 3.2 by 2.8 cm. This contains a focal area of fat attenuation measuring 1.2 cm, image 78/8. Other: There is no free fluid identified within the abdomen or pelvis. No focal fluid collections identified. Increased soft tissue stranding S is identified within the left upper quadrant peritoneal fat, image 63/4. No discrete peritoneal nodule or mass noted however. Musculoskeletal: No acute or significant osseous findings. IMPRESSION: 1. Large left breast mass is identified compatible with primary breast carcinoma. This extends posteriorly to the pectoralis major muscle and extends anteriorly to the skin surface. Cannot exclude chest wall involvement. 2. Enlarged left axillary, left retropectoral, and left internal mammary lymph nodes compatible with metastatic adenopathy. 3. Soft tissue stranding within the left upper quadrant of the abdomen is identified. There is also mild soft tissue stranding is in the omentum. No discrete measurable nodule identified. Although not  diagnostic for peritoneal disease, given the extent of tumor involvement in the left breast and left axilla early peritoneal disease cannot be excluded. 4. Multiple enhancing pelvic masses are identified which appear closely associated with the uterus. Some of these likely represent uterine fibroids. Within the left pelvis there is a mixed attenuation mass which contains a focal area of fat attenuation. 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 Advise further evaluation with pelvic sonogram for more definitive characterization. Electronically Signed   By: Kerby Moors M.D.   On: 04/04/2020 19:28   US BREAST LTD UNI LEFT INC AXILLA  Result Date: 04/03/2020 CLINICAL DATA:  Patient presents with redness of the left breast and a large left breast mass. The patient was evaluated through the emergency department for evaluation of possible abscess. EXAM: ULTRASOUND OF THE LEFT BREAST COMPARISON:  None. FINDINGS: On physical exam, there is a large mass within the left breast. Targeted ultrasound is performed, showing a 6.3 x 6.2 x 7.1 cm lobular hypoechoic solid mass left breast 1 o'clock position (upper-outer quadrant). Multiple abnormal cortically thickened left axillary lymph nodes are demonstrated measuring up to 2.6 cm with effacement of the fatty hilum. IMPRESSION: 1. Large solid mass left breast 1 o'clock position concerning for malignancy. 2. Multiple abnormal cortically thickened left axillary lymph nodes concerning for metastatic adenopathy. RECOMMENDATION: 1. Recommend evaluation of the right and left breast with dedicated mammography and ultrasound at a dedicated breast center. This will allow for comprehensive evaluation given the concern of left breast malignancy. 2. Ultimately patient will need at least a biopsy of the large palpable left breast mass and 1 of the left axillary lymph nodes. BI-RADS CATEGORY  5: Highly suggestive of malignancy. These results were called by telephone at the  time of interpretation on 04/03/2020 at 4:33 pm to provider Dr. Charna Archer, who verbally acknowledged these results. Electronically Signed   By: Lovey Newcomer M.D.   On: 04/03/2020 16:51   [place] LT BREAST BX W LOC DEV 1ST LESION IMG BX SPEC US GUIDE  Result Date: 04/06/2020 CLINICAL DATA:  Left breast 1 o'clock large mass. EXAM: ULTRASOUND GUIDED LEFT BREAST CORE NEEDLE BIOPSY COMPARISON:  Previous exam(s). PROCEDURE: I met with the patient and we discussed the procedure of ultrasound-guided biopsy, including benefits and alternatives. We discussed the high likelihood of a successful procedure. We discussed the risks of the procedure, including infection, bleeding, tissue injury, clip migration, and inadequate sampling. Informed written consent was given. The usual time-out protocol was performed immediately prior to the procedure. Lesion quadrant: All  quadrants of the breast. Using sterile technique and 1% Lidocaine as local anesthetic, under direct ultrasound visualization, a 14 gauge spring-loaded device was used to perform biopsy of left breast 1 o'clock mass using a lateral approach. At the conclusion of the procedure vision tissue marker clip was deployed into the biopsy cavity. Next, using sterile technique and 1% Lidocaine as local anesthetic, under direct ultrasound visualization, a 14 gauge spring-loaded device was used to perform biopsy of left axillary lymph node using a lateral approach. At the conclusion of the procedure shaped 3 HydroMARK tissue marker clip was deployed into the biopsy cavity Follow up 2 view mammogram was not performed as the patient was sedated and unsafe to stand up. IMPRESSION: Ultrasound guided biopsy of large left breast mass and an abnormal left axillary lymph node. No apparent complications. Electronically Signed   By: Fidela Salisbury M.D.   On: 04/06/2020 10:17   [place] LT BREAST BX W LOC DEV EA ADD LESION IMG BX SPEC US GUIDE  Result Date: 04/06/2020 CLINICAL DATA:  Left  breast 1 o'clock large mass. EXAM: ULTRASOUND GUIDED LEFT BREAST CORE NEEDLE BIOPSY COMPARISON:  Previous exam(s). PROCEDURE: I met with the patient and we discussed the procedure of ultrasound-guided biopsy, including benefits and alternatives. We discussed the high likelihood of a successful procedure. We discussed the risks of the procedure, including infection, bleeding, tissue injury, clip migration, and inadequate sampling. Informed written consent was given. The usual time-out protocol was performed immediately prior to the procedure. Lesion quadrant: All quadrants of the breast. Using sterile technique and 1% Lidocaine as local anesthetic, under direct ultrasound visualization, a 14 gauge spring-loaded device was used to perform biopsy of left breast 1 o'clock mass using a lateral approach. At the conclusion of the procedure vision tissue marker clip was deployed into the biopsy cavity. Next, using sterile technique and 1% Lidocaine as local anesthetic, under direct ultrasound visualization, a 14 gauge spring-loaded device was used to perform biopsy of left axillary lymph node using a lateral approach. At the conclusion of the procedure shaped 3 HydroMARK tissue marker clip was deployed into the biopsy cavity Follow up 2 view mammogram was not performed as the patient was sedated and unsafe to stand up. IMPRESSION: Ultrasound guided biopsy of large left breast mass and an abnormal left axillary lymph node. No apparent complications. Electronically Signed   By: Fidela Salisbury M.D.   On: 04/06/2020 10:17    PERFORMANCE STATUS (ECOG) : 3 - Symptomatic, >50% confined to bed  Review of Systems Unless otherwise noted, a complete review of systems is negative.  Physical Exam General: NAD Pulmonary: Unlabored Extremities: no edema, no joint deformities Skin: no rashes Neurological: Weakness, confused  IMPRESSION: Follow-up visit.  Patient is status post breast/lymph node biopsy earlier today.   She seems to be doing well presently and denies symptomatic complaints.  She remains pleasantly confused.  She continues to have a Actuary for safety.  No family present.  PLAN: -Continue current scope of treatment -Will plan to follow patient in the clinic following discharge from hospital   Time Total: 15 minutes  Visit consisted of counseling and education dealing with the complex and emotionally intense issues of symptom management and palliative care in the setting of serious and potentially life-threatening illness.Greater than 50%  of this time was spent counseling and coordinating care related to the above assessment and plan.  Signed by: Altha Harm, PhD, NP-C

## 2020-04-06 NOTE — Progress Notes (Signed)
[facility] Hospital Day(s): 2.   Post op day(s):  [facility] Kitchen   Interval History: Patient seen and examined, no acute events or new complaints overnight as per report by nurse. Patient today is more drowsy due to the sedation given for the procedure.  Patient in no distress  Vital signs in last 24 hours: [min-max] current  Temp:  [98.3 F (36.8 C)-100.6 F (38.1 C)] 99.2 F (37.3 C) (11/12 0736) Pulse Rate:  [111-128] 118 (11/12 0736) Resp:  [16-20] 20 (11/12 0736) BP: (102-139)/(66-84) 113/69 (11/12 0736) SpO2:  [98 %-100 %] 99 % (11/12 0736)     Height: 5\' 1"  (154.9 cm) Weight: 63.5 kg BMI (Calculated): 26.47   Physical Exam:  Constitutional: alert, cooperative and no distress  Respiratory: breathing non-labored at rest  Cardiovascular: regular rate and sinus rhythm Breast: Left breast palpable mass without any changes from previous physical exam Gastrointestinal: soft, non-tender, and non-distended  Labs:  CBC Latest Ref Rng & Units 04/06/2020 04/05/2020 04/03/2020  WBC 4.0 - 10.5 K/uL 17.1(H) 7.6 12.1(H)  Hemoglobin 12.0 - 15.0 g/dL 12.3 11.3(L) 12.5  Hematocrit 36 - 46 % 35.6(L) 32.4(L) 35.8(L)  Platelets 150 - 400 K/uL 322 297 342   CMP Latest Ref Rng & Units 04/06/2020 04/05/2020 04/03/2020  Glucose 70 - 99 mg/dL 108(H) 88 153(H)  BUN 6 - 20 mg/dL <5(L) <5(L) <5(L)  Creatinine 0.44 - 1.00 mg/dL 0.60 0.48 0.65  Sodium 135 - 145 mmol/L 127(L) 131(L) 125(L)  Potassium 3.5 - 5.1 mmol/L 3.5 3.9 3.1(L)  Chloride 98 - 111 mmol/L 92(L) 97(L) 88(L)  CO2 22 - 32 mmol/L 27 26 24   Calcium 8.9 - 10.3 mg/dL 9.1 9.1 9.5  Total Protein 6.5 - 8.1 g/dL - - 8.6(H)  Total Bilirubin 0.3 - 1.2 mg/dL - - 0.4  Alkaline Phos 38 - 126 U/L - - 68  AST 15 - 41 U/L - - 25  ALT 0 - 44 U/L - - 13    Imaging studies: I personally evaluated the images of the ultrasound-guided core needle biopsy   Assessment/Plan:  49 y.o.femalewith large left breast mass, complicated by pertinent  comorbidities includingschizophrenia.  Patient without any distress.  There has been no changes on history or physical exam.  As per discussed yesterday with medical oncology and family patient had core needle biopsy of the left breast mass and axillary lymph nodes.  With the results patient will be evaluated for possible neoadjuvant chemotherapy.  At this moment as discussed yesterday with the family surgical management is deferred until final discussion with medical oncologist regarding the disposition for chemotherapy.  We discussed about the alternative of giving oral chemotherapy versus intravenous chemotherapy.  No further surgical management during this admission but if any surgical intervention needs discussed again I am available for discussion and evaluation.  Arnold Long, MD

## 2020-04-06 NOTE — Telephone Encounter (Signed)
On 11/11-I again spoke to patient's sister regarding the plan for breast/lymph node biopsy on 11/12 at the breast center.  Patient sister plans to be available for the procedure in the morning.

## 2020-04-07 DIAGNOSIS — F200 Paranoid schizophrenia: Secondary | ICD-10-CM | POA: Diagnosis not present

## 2020-04-07 DIAGNOSIS — F70 Mild intellectual disabilities: Secondary | ICD-10-CM | POA: Diagnosis not present

## 2020-04-07 LAB — CBC
HCT: 31.5 % — ABNORMAL LOW (ref 36.0–46.0)
Hemoglobin: 11 g/dL — ABNORMAL LOW (ref 12.0–15.0)
MCH: 30.9 pg (ref 26.0–34.0)
MCHC: 34.9 g/dL (ref 30.0–36.0)
MCV: 88.5 fL (ref 80.0–100.0)
Platelets: 273 10*3/uL (ref 150–400)
RBC: 3.56 MIL/uL — ABNORMAL LOW (ref 3.87–5.11)
RDW: 12.8 % (ref 11.5–15.5)
WBC: 16.8 10*3/uL — ABNORMAL HIGH (ref 4.0–10.5)
nRBC: 0 % (ref 0.0–0.2)

## 2020-04-07 LAB — MAGNESIUM: Magnesium: 1.8 mg/dL (ref 1.7–2.4)

## 2020-04-07 LAB — BASIC METABOLIC PANEL
Anion gap: 11 (ref 5–15)
BUN: 6 mg/dL (ref 6–20)
CO2: 24 mmol/L (ref 22–32)
Calcium: 8.6 mg/dL — ABNORMAL LOW (ref 8.9–10.3)
Chloride: 97 mmol/L — ABNORMAL LOW (ref 98–111)
Creatinine, Ser: 0.71 mg/dL (ref 0.44–1.00)
GFR, Estimated: >60 mL/min (ref >60)
Glucose, Bld: 114 mg/dL — ABNORMAL HIGH (ref 70–99)
Potassium: 3.4 mmol/L — ABNORMAL LOW (ref 3.5–5.1)
Sodium: 132 mmol/L — ABNORMAL LOW (ref 135–145)

## 2020-04-07 LAB — PROCALCITONIN: Procalcitonin: <0.1 ng/mL

## 2020-04-07 MED ORDER — POTASSIUM CHLORIDE 20 MEQ PO PACK
20.0000 meq | PACK | Freq: Once | ORAL | Status: AC
  Administered 2020-04-07: 20 meq via ORAL
  Filled 2020-04-07: qty 1

## 2020-04-07 NOTE — Progress Notes (Signed)
PROGRESS NOTE    Tamara [name]  LKG:[id number] DOB: 1970-11-28 DOA: 04/03/2020 PCP: [facility]   Brief Narrative:  Tamara [name] is a 49 y.o. female with medical history significant for hypothyroid congenital, mild intellectual disability, schizophrenia, presented to the emergency department acutely and agitated with screaming. Patient was emergently IVC due to concern for acute psychosis.  While EKG leads were being place, left breast mass was discovered. Ultrasound showed mass concerning for malignancy.  Patient admitted for further inpatient work-up of left breast mass.  Assessment & Plan:   Left breast mass: Concerning for malignancy -S/p ultrasound-guided core needle biopsy of left breast mass and axillary lymph node by IR on 11/12.  -ultrasound of left breast shows large solid mass at 1 o'clock position concerning for malignancy.  -Reviewed CT chest/abdomen/pelvis shows: Large left breast mass is identified compatible with primary breast carcinoma.  This extends posteriorly to the pectoralis major and extending anteriorly to the skin surface.  Cannot exclude chest wall involvement.  Enlarged left axillary and left retropectoral and left internal mammary lymph nodes compatible with metastatic adenopathy. -Appreciate general surgery recommendations-no surgical management at this time.  -Appreciate oncology recommendations. -Await biopsy results.  SIRS: -UTI? -Patient febrile, tachycardic, hypotensive and has leukocytosis. -Chest x-ray negative for pneumonia, lactic acid PCT: WNL, blood culture: Negative so far, UA concerning for infection.  Positive for leukocytes, bacteria and WBC.  Urine culture: Ordered. -Increase IV fluid rate 2/2 tachycardia and hypotension.  Continue IV antibiotics-Zosyn.  -Continue Tylenol for fever more than 100.4.  Schizophrenia - 1:1 observation ordered -Continued home meds-Depakote, Haldol, hydroxyzine. -Appreciate psych  input  Hyponatremia: Chronic but improving. -patient is asymptomatic.   -Urine sodium: WNL.  Continue to monitor.  Congenital hypothyroidism-resume levothyroxine  Hypokalemia: Potassium: 3.4.  Replenished.  Magnesium level: WNL  Mild intellectual disability-appears at baseline  DVT prophylaxis: Lovenox Code Status: Full code Family Communication:  None present at bedside.  Plan of care discussed with patient in length and she verbalized understanding and agreed with it.  Disposition Plan: Home  Consultants:  General surgery Oncology Palliative care  Procedures:   Breast ultrasound  CT chest/abdomen/pelvis  Ultrasound guided core biopsy of left breast mass and axillary lymph node.  Antimicrobials:   zosyn  Status is: Inpatient  Dispo: The patient is from: Home              Anticipated d/c is to: home?              Anticipated d/c date is: More than 3 days              Patient currently not medically stable for the discharge.   Subjective: Patient seen and examined.  Sleepy but arousable however appears very weak and lethargic.  She denies pain in left breast.  Objective: Vitals:   04/07/20 0200 04/07/20 0554 04/07/20 0731 04/07/20 1212  BP: 107/69 102/64 (!) 93/56 99/62  Pulse: (!) 110 (!) 108 (!) 101 (!) 103  Resp: _0 Temp: 98.7 F (37.1 C) 98.9 F (37.2 C) 98.9 F (37.2 C) 98 F (36.7 C)  TempSrc: Axillary Axillary Axillary Oral  SpO2: 96% 98% 99% 100%  Weight:      Height:        Intake/Output Summary (Last 24 hours) at 04/07/2020 1254 Last data filed at 04/07/2020 1205 Gross per 24 hour  Intake 2196.61 ml  Output 1500 ml  Net 696.61 ml   [employer]  04/03/20 1523  Weight: 63.5 kg    Examination:  General exam: Appears calm and comfortable, on room air, sleepy but arousable, appears weak and lethargic.  Respiratory system: Clear to auscultation. Respiratory effort normal. Cardiovascular system: Tachycardic S1 & S2 heard,  RRR. No JVD, murmurs, rubs, gallops or clicks. No pedal edema. Gastrointestinal system: Abdomen is nondistended, soft and nontender. No organomegaly or masses felt. Normal bowel sounds heard Chest: Left breast: Left breast mass- firm, erythematous, Band-Aid noted.  No signs of active bleeding or discharge seen. Central nervous system: Alert and oriented. No focal neurological deficits. Extremities: Symmetric 5 x 5 power. Skin: No rashes, lesions or ulcers.  Data Reviewed: I have personally reviewed following labs and imaging studies  CBC: Recent Labs  Lab 04/03/20 1540 04/05/20 0400 04/06/20 0434 04/07/20 0348  WBC 12.1* 7.6 17.1* 16.8*  NEUTROABS 9.2*  --   --   --   HGB 12.5 11.3* 12.3 11.0*  HCT 35.8* 32.4* 35.6* 31.5*  MCV 87.5 89.3 89.0 88.5  PLT 342 297 322 826   Basic Metabolic Panel: Recent Labs  Lab 04/03/20 1540 04/05/20 0400 04/06/20 0434 04/07/20 0348  NA 125* 131* 127* 132*  K 3.1* 3.9 3.5 3.4*  CL 88* 97* 92* 97*  CO2 _0 GLUCOSE 153* 88 108* 114*  BUN <5* <5* <5* 6  CREATININE 0.65 0.48 0.60 0.71  CALCIUM 9.5 9.1 9.1 8.6*  MG  --  2.1  --  1.8   GFR: Estimated Creatinine Clearance: 73.4 mL/min (by C-G formula based on SCr of 0.71 mg/dL). Liver Function Tests: Recent Labs  Lab 04/03/20 1540  AST 25  ALT 13  ALKPHOS 68  BILITOT 0.4  PROT 8.6*  ALBUMIN 4.2   No results for input(s): LIPASE, AMYLASE in the last 168 hours. No results for input(s): AMMONIA in the last 168 hours. Coagulation Profile: No results for input(s): INR, PROTIME in the last 168 hours. Cardiac Enzymes: No results for input(s): CKTOTAL, CKMB, CKMBINDEX, TROPONINI in the last 168 hours. BNP (last 3 results) No results for input(s): PROBNP in the last 8760 hours. HbA1C: No results for input(s): HGBA1C in the last 72 hours. CBG: No results for input(s): GLUCAP in the last 168 hours. Lipid Profile: No results for input(s): CHOL, HDL, LDLCALC, TRIG, CHOLHDL,  LDLDIRECT in the last 72 hours. Thyroid Function Tests: No results for input(s): TSH, T4TOTAL, FREET4, T3FREE, THYROIDAB in the last 72 hours. Anemia Panel: No results for input(s): VITAMINB12, FOLATE, FERRITIN, TIBC, IRON, RETICCTPCT in the last 72 hours. Sepsis Labs: Recent Labs  Lab 04/06/20 0434 04/06/20 0950 04/07/20 0348  PROCALCITON <0.10  --  <0.10  LATICACIDVEN  --  1.8  --     Recent Results (from the past 240 hour(s))  Respiratory Panel by RT PCR (Flu A&B, Covid) - Nasopharyngeal Swab     Status: None   Collection Time: 04/03/20  3:43 PM   Specimen: Nasopharyngeal Swab  Result Value Ref Range Status   SARS Coronavirus 2 by RT PCR NEGATIVE NEGATIVE Final    Comment: (NOTE) SARS-CoV-2 target nucleic acids are NOT DETECTED.  The SARS-CoV-2 RNA is generally detectable in upper respiratoy specimens during the acute phase of infection. The lowest concentration of SARS-CoV-2 viral copies this assay can detect is 131 copies/mL. A negative result does not preclude SARS-Cov-2 infection and should not be used as the sole basis for treatment or other patient management decisions. A negative result may occur with  improper  specimen collection/handling, submission of specimen other than nasopharyngeal swab, presence of viral mutation(s) within the areas targeted by this assay, and inadequate number of viral copies (<131 copies/mL). A negative result must be combined with clinical observations, patient history, and epidemiological information. The expected result is Negative.  Fact Sheet for Patients:  PinkCheek.be  Fact Sheet for Healthcare Providers:  GravelBags.it  This test is no t yet approved or cleared by the [place] FDA and  has been authorized for detection and/or diagnosis of SARS-CoV-2 by FDA under an Emergency Use Authorization (EUA). This EUA will remain  in effect (meaning this test can be used) for  the duration of the COVID-19 declaration under Section 564(b)(1) of the Act, 21 U.S.C. section 360bbb-3(b)(1), unless the authorization is terminated or revoked sooner.     Influenza A by PCR NEGATIVE NEGATIVE Final   Influenza B by PCR NEGATIVE NEGATIVE Final    Comment: (NOTE) The Xpert Xpress SARS-CoV-2/FLU/RSV assay is intended as an aid in  the diagnosis of influenza from Nasopharyngeal swab specimens and  should not be used as a sole basis for treatment. Nasal washings and  aspirates are unacceptable for Xpert Xpress SARS-CoV-2/FLU/RSV  testing.  Fact Sheet for Patients: PinkCheek.be  Fact Sheet for Healthcare Providers: GravelBags.it  This test is not yet approved or cleared by the [place] FDA and  has been authorized for detection and/or diagnosis of SARS-CoV-2 by  FDA under an Emergency Use Authorization (EUA). This EUA will remain  in effect (meaning this test can be used) for the duration of the  Covid-19 declaration under Section 564(b)(1) of the Act, 21  U.S.C. section 360bbb-3(b)(1), unless the authorization is  terminated or revoked. Performed at [facility], [address] [place], [place] [address]   CULTURE, BLOOD (ROUTINE X 2) w Reflex to ID Panel     Status: None (Preliminary result)   Collection Time: 04/06/20  9:51 AM   Specimen: BLOOD  Result Value Ref Range Status   Specimen Description BLOOD BLOOD RIGHT HAND  Final   Special Requests   Final    BOTTLES DRAWN AEROBIC AND ANAEROBIC Blood Culture results may not be optimal due to an inadequate volume of blood received in culture bottles   Culture   Final    NO GROWTH < 24 HOURS Performed at [facility], [address], [place], [place] [address]    Report Status PENDING  Incomplete  CULTURE, BLOOD (ROUTINE X 2) w Reflex to ID Panel     Status: None (Preliminary result)   Collection Time: 04/06/20 10:48 AM    Specimen: BLOOD  Result Value Ref Range Status   Specimen Description BLOOD BLOOD RIGHT HAND  Final   Special Requests   Final    BOTTLES DRAWN AEROBIC AND ANAEROBIC Blood Culture adequate volume   Culture   Final    NO GROWTH < 24 HOURS Performed at [facility], [address] [place], [place] [address]    Report Status PENDING  Incomplete      Radiology Studies: DG Chest 1 View  Result Date: 04/06/2020 CLINICAL DATA:  Possible pneumonia EXAM: CHEST  1 VIEW COMPARISON:  04/04/2020 FINDINGS: Cardiac shadow is within normal limits. The lungs are well aerated bilaterally. No focal infiltrate or effusion is seen. No bony abnormality is noted. IMPRESSION: No acute abnormality seen. Electronically Signed   By: Inez Catalina M.D.   On: 04/06/2020 16:20   [place] LT BREAST BX W LOC DEV  1ST LESION IMG BX SPEC US GUIDE  Result Date: 04/06/2020 CLINICAL DATA:  Left breast 1 o'clock large mass. EXAM: ULTRASOUND GUIDED LEFT BREAST CORE NEEDLE BIOPSY COMPARISON:  Previous exam(s). PROCEDURE: I met with the patient and we discussed the procedure of ultrasound-guided biopsy, including benefits and alternatives. We discussed the high likelihood of a successful procedure. We discussed the risks of the procedure, including infection, bleeding, tissue injury, clip migration, and inadequate sampling. Informed written consent was given. The usual time-out protocol was performed immediately prior to the procedure. Lesion quadrant: All quadrants of the breast. Using sterile technique and 1% Lidocaine as local anesthetic, under direct ultrasound visualization, a 14 gauge spring-loaded device was used to perform biopsy of left breast 1 o'clock mass using a lateral approach. At the conclusion of the procedure vision tissue marker clip was deployed into the biopsy cavity. Next, using sterile technique and 1% Lidocaine as local anesthetic, under direct ultrasound visualization, a 14 gauge spring-loaded device was  used to perform biopsy of left axillary lymph node using a lateral approach. At the conclusion of the procedure shaped 3 HydroMARK tissue marker clip was deployed into the biopsy cavity Follow up 2 view mammogram was not performed as the patient was sedated and unsafe to stand up. IMPRESSION: Ultrasound guided biopsy of large left breast mass and an abnormal left axillary lymph node. No apparent complications. Electronically Signed   By: Fidela Salisbury M.D.   On: 04/06/2020 10:17   [place] LT BREAST BX W LOC DEV EA ADD LESION IMG BX SPEC US GUIDE  Result Date: 04/06/2020 CLINICAL DATA:  Left breast 1 o'clock large mass. EXAM: ULTRASOUND GUIDED LEFT BREAST CORE NEEDLE BIOPSY COMPARISON:  Previous exam(s). PROCEDURE: I met with the patient and we discussed the procedure of ultrasound-guided biopsy, including benefits and alternatives. We discussed the high likelihood of a successful procedure. We discussed the risks of the procedure, including infection, bleeding, tissue injury, clip migration, and inadequate sampling. Informed written consent was given. The usual time-out protocol was performed immediately prior to the procedure. Lesion quadrant: All quadrants of the breast. Using sterile technique and 1% Lidocaine as local anesthetic, under direct ultrasound visualization, a 14 gauge spring-loaded device was used to perform biopsy of left breast 1 o'clock mass using a lateral approach. At the conclusion of the procedure vision tissue marker clip was deployed into the biopsy cavity. Next, using sterile technique and 1% Lidocaine as local anesthetic, under direct ultrasound visualization, a 14 gauge spring-loaded device was used to perform biopsy of left axillary lymph node using a lateral approach. At the conclusion of the procedure shaped 3 HydroMARK tissue marker clip was deployed into the biopsy cavity Follow up 2 view mammogram was not performed as the patient was sedated and unsafe to stand up. IMPRESSION:  Ultrasound guided biopsy of large left breast mass and an abnormal left axillary lymph node. No apparent complications. Electronically Signed   By: Fidela Salisbury M.D.   On: 04/06/2020 10:17    Scheduled Meds: . benztropine  0.5 mg Oral BID  . divalproex  500 mg Oral Q12H  . enoxaparin (LOVENOX) injection  40 mg Subcutaneous Q24H  . haloperidol  5 mg Oral TID  . hydrOXYzine  25 mg Oral TID  . levothyroxine  88 mcg Oral Q0600  . loratadine  10 mg Oral Daily   Continuous Infusions: . sodium chloride 125 mL/hr at 04/07/20 1241  . piperacillin-tazobactam (ZOSYN)  IV Stopped (04/07/20 1138)     LOS:  3 days   Time spent: 40 minutes   Ron Beske Loann Quill, MD [facility]  If 7PM-7AM, please contact night-coverage [email or web address] 04/07/2020, 12:54 PM

## 2020-04-07 NOTE — Progress Notes (Signed)
Mobility Specialist - Progress Note   04/07/20 1100  Mobility  Activity Ambulated in room;Ambulated to bathroom  Level of Assistance Standby assist, set-up cues, supervision of patient - no hands on  Assistive Device None (Pt pushed IV pole)  Distance Ambulated (ft) 70 ft  Mobility Response Tolerated well  Mobility performed by Mobility specialist  $Mobility charge 1 Mobility    Pre-mobility: 103 HR, 98% SpO2 During mobility: 115 HR, 96% SpO2 Post-mobility: 108 HR, 98% SpO2   Pt was sleeping on arrival utilizing room air with sitter present. Pt was awakened by touch and voice. Pt agreed to session. Pt denied any pain, nausea, or fatigue. Pt was able to get EOB and stand with supervision. CGA donned for safety precautions. Pt ambulated 50' in room, pushing own IV pole, before requesting to use restroom. Pt performed hygiene tasks independently. Pt continued ambulation in room, denying SOB or weakness. No LOB noted during activity. Pt denied dizziness throughout session. Pt was able to return EOB-supine with supervision before drifting back to sleep. Overall, pt tolerated session well. Pt was left in bed with alarm set and sitter present.    Kathee Delton Mobility Specialist 04/07/20, 11:39 AM

## 2020-04-07 NOTE — Consult Note (Signed)
[facility] Consult   Reason for Consult: Follow-up consult for this 49 year old woman with a history of intellectual disability and schizophrenia currently in the hospital for work-up of possible breast cancer Referring Physician:  Pahwani Patient Identification: Tamara [name] MRN:  [id number] Principal Diagnosis: Mild intellectual disability Diagnosis:  Principal Problem:   Mild intellectual disability Active Problems:   Schizophrenia, paranoid type ([place])   Congenital hypothyroidism without goiter   Schizophrenia ([place])   Large mass of left breast   Palliative care encounter   Breast mass in female   Total Time spent with patient: 30 minutes  Subjective:   Tamara [name] is a 49 y.o. female patient admitted with "I am doing okay".  HPI: Follow-up consult for this patient with a history of intellectual disability and schizophrenia.  She had been originally brought to the emergency room because of behavior problems but was found to have a mass on her chest.  Since being admitted to the medical service patient's behavior has been under pretty good control.  I came up to see her this evening and she was in bed having a normal conversation with her sitter about what she wanted for supper.  Patient has no complaints.  Denies any pain.  Denies any thoughts of hurting herself or anyone else.  Denies hallucinations.  Sitter reports that the patient has mostly been sleeping all day and has not been any behavior problem.  Past Psychiatric History: History of serious behavior problems intermittently but generally has been able to live at home safely with medication management  Risk to Self:   Risk to Others:   Prior Inpatient Therapy:   Prior Outpatient Therapy:    Past Medical History:  Past Medical History:  Diagnosis Date  . GERD (gastroesophageal reflux disease)   . IUD (intrauterine device) in place IUD was placed in 2016  . Leiomyoma of uterus   . Schizophrenia  ([place])   . Thyroid disease    No past surgical history on file. Family History: No family history on file. Family Psychiatric  History: See previous Social History:  Social History   Substance and Sexual Activity  Alcohol Use No     Social History   Substance and Sexual Activity  Drug Use No    Social History   Socioeconomic History  . Marital status: Single    Spouse name: Not on file  . Number of children: Not on file  . Years of education: Not on file  . Highest education level: Not on file  Occupational History  . Not on file  Tobacco Use  . Smoking status: Never Smoker  . Smokeless tobacco: Never Used  Substance and Sexual Activity  . Alcohol use: No  . Drug use: No  . Sexual activity: Yes    Birth control/protection: Surgical  Other Topics Concern  . Not on file  Social History Narrative  . Not on file   Social Determinants of Health   Financial Resource Strain:   . Difficulty of Paying Living Expenses: Not on file  Food Insecurity:   . Worried About Charity fundraiser in the Last Year: Not on file  . Ran Out of Food in the Last Year: Not on file  Transportation Needs:   . Lack of Transportation (Medical): Not on file  . Lack of Transportation (Non-Medical): Not on file  Physical Activity:   . Days of Exercise per Week: Not on file  . Minutes of Exercise per Session: Not  on file  Stress:   . Feeling of Stress : Not on file  Social Connections:   . Frequency of Communication with Friends and Family: Not on file  . Frequency of Social Gatherings with Friends and Family: Not on file  . Attends Religious Services: Not on file  . Active Member of Clubs or Organizations: Not on file  . Attends Archivist Meetings: Not on file  . Marital Status: Not on file   Additional Social History:    Allergies:  No Known Allergies  Labs:  Results for orders placed or performed during the hospital encounter of 04/03/20 (from the past 48 hour(s))   Sodium, urine, random     Status: None   Collection Time: 04/05/20  9:53 PM  Result Value Ref Range   Sodium, Ur 89 mmol/L    Comment: Performed at [facility], [address], [place], [place] [address]  Basic metabolic panel     Status: Abnormal   Collection Time: 04/06/20  4:34 AM  Result Value Ref Range   Sodium 127 (L) 135 - 145 mmol/L   Potassium 3.5 3.5 - 5.1 mmol/L   Chloride 92 (L) 98 - 111 mmol/L   CO2 27 22 - 32 mmol/L   Glucose, Bld 108 (H) 70 - 99 mg/dL    Comment: Glucose reference range applies only to samples taken after fasting for at least 8 hours.   BUN <5 (L) 6 - 20 mg/dL   Creatinine, Ser 0.60 0.44 - 1.00 mg/dL   Calcium 9.1 8.9 - 10.3 mg/dL   GFR, Estimated >60 >60 mL/min    Comment: (NOTE) Calculated using the CKD-EPI Creatinine Equation (2021)    Anion gap 8 5 - 15    Comment: Performed at [facility], [address] [place], [place] [address]  CBC     Status: Abnormal   Collection Time: 04/06/20  4:34 AM  Result Value Ref Range   WBC 17.1 (H) 4.0 - 10.5 K/uL   RBC 4.00 3.87 - 5.11 MIL/uL   Hemoglobin 12.3 12.0 - 15.0 g/dL   HCT 35.6 (L) 36 - 46 %   MCV 89.0 80.0 - 100.0 fL   MCH 30.8 26.0 - 34.0 pg   MCHC 34.6 30.0 - 36.0 g/dL   RDW 13.1 11.5 - 15.5 %   Platelets 322 150 - 400 K/uL   nRBC 0.0 0.0 - 0.2 %    Comment: Performed at [facility], [address] [place], [place] [address]  Procalcitonin - Baseline     Status: None   Collection Time: 04/06/20  4:34 AM  Result Value Ref Range   Procalcitonin <0.10 ng/mL    Comment:        Interpretation: PCT (Procalcitonin) <= 0.5 ng/mL: Systemic infection (sepsis) is not likely. Local bacterial infection is possible. (NOTE)       Sepsis PCT Algorithm           Lower Respiratory Tract                                      Infection PCT Algorithm    ----------------------------     ----------------------------         PCT < 0.25 ng/mL                PCT < 0.10  ng/mL  Strongly encourage             Strongly discourage   discontinuation of antibiotics    initiation of antibiotics    ----------------------------     -----------------------------       PCT 0.25 - 0.50 ng/mL            PCT 0.10 - 0.25 ng/mL               OR       >80% decrease in PCT            Discourage initiation of                                            antibiotics      Encourage discontinuation           of antibiotics    ----------------------------     -----------------------------         PCT >= 0.50 ng/mL              PCT 0.26 - 0.50 ng/mL               AND        <80% decrease in PCT             Encourage initiation of                                             antibiotics       Encourage continuation           of antibiotics    ----------------------------     -----------------------------        PCT >= 0.50 ng/mL                  PCT > 0.50 ng/mL               AND         increase in PCT                  Strongly encourage                                      initiation of antibiotics    Strongly encourage escalation           of antibiotics                                     -----------------------------                                           PCT <= 0.25 ng/mL                                                 OR                                        >   80% decrease in PCT                                      Discontinue / Do not initiate                                             antibiotics  Performed at [facility], [address] [place], [place] [address]   Lactic acid, plasma     Status: None   Collection Time: 04/06/20  9:50 AM  Result Value Ref Range   Lactic Acid, Venous 1.8 0.5 - 1.9 mmol/L    Comment: Performed at [facility], [address] [place], [place] [address]  CULTURE, BLOOD (ROUTINE X 2) w Reflex to ID Panel     Status: None (Preliminary result)   Collection Time: 04/06/20  9:51 AM   Specimen:  BLOOD  Result Value Ref Range   Specimen Description BLOOD BLOOD RIGHT HAND    Special Requests      BOTTLES DRAWN AEROBIC AND ANAEROBIC Blood Culture results may not be optimal due to an inadequate volume of blood received in culture bottles   Culture      NO GROWTH < 24 HOURS Performed at [facility], [address] [place], [place] [address]    Report Status PENDING   CULTURE, BLOOD (ROUTINE X 2) w Reflex to ID Panel     Status: None (Preliminary result)   Collection Time: 04/06/20 10:48 AM   Specimen: BLOOD  Result Value Ref Range   Specimen Description BLOOD BLOOD RIGHT HAND    Special Requests      BOTTLES DRAWN AEROBIC AND ANAEROBIC Blood Culture adequate volume   Culture      NO GROWTH < 24 HOURS Performed at [facility], [address] [place], [place] [address]    Report Status PENDING   Urinalysis, Complete w Microscopic Urine, Clean Catch     Status: Abnormal   Collection Time: 04/06/20  3:30 PM  Result Value Ref Range   Color, Urine YELLOW (A) YELLOW   APPearance TURBID (A) CLEAR   Specific Gravity, Urine 1.008 1.005 - 1.030   pH 5.0 5.0 - 8.0   Glucose, UA NEGATIVE NEGATIVE mg/dL   Hgb urine dipstick MODERATE (A) NEGATIVE   Bilirubin Urine NEGATIVE NEGATIVE   Ketones, ur NEGATIVE NEGATIVE mg/dL   Protein, ur 30 (A) NEGATIVE mg/dL   Nitrite NEGATIVE NEGATIVE   Leukocytes,Ua LARGE (A) NEGATIVE   RBC / HPF 0-5 0 - 5 RBC/hpf   WBC, UA 21-50 0 - 5 WBC/hpf   Bacteria, UA MANY (A) NONE SEEN   Squamous Epithelial / LPF 0-5 0 - 5   WBC Clumps PRESENT     Comment: Performed at [facility], [address] [place], [place] [address]  CBC     Status: Abnormal   Collection Time: 04/07/20  3:48 AM  Result Value Ref Range   WBC 16.8 (H) 4.0 - 10.5 K/uL   RBC 3.56 (L) 3.87 - 5.11 MIL/uL   Hemoglobin 11.0 (L) 12.0 - 15.0 g/dL   HCT 31.5 (L) 36 - 46 %   MCV 88.5 80.0 - 100.0 fL   MCH 30.9 26.0 - 34.0 pg   MCHC 34.9 30.0 - 36.0  g/dL    RDW 12.8 11.5 - 15.5 %   Platelets 273 150 - 400 K/uL   nRBC 0.0 0.0 - 0.2 %    Comment: Performed at [facility], [address] [place], [place] [address]  Basic metabolic panel     Status: Abnormal   Collection Time: 04/07/20  3:48 AM  Result Value Ref Range   Sodium 132 (L) 135 - 145 mmol/L   Potassium 3.4 (L) 3.5 - 5.1 mmol/L   Chloride 97 (L) 98 - 111 mmol/L   CO2 24 22 - 32 mmol/L   Glucose, Bld 114 (H) 70 - 99 mg/dL    Comment: Glucose reference range applies only to samples taken after fasting for at least 8 hours.   BUN 6 6 - 20 mg/dL   Creatinine, Ser 0.71 0.44 - 1.00 mg/dL   Calcium 8.6 (L) 8.9 - 10.3 mg/dL   GFR, Estimated >60 >60 mL/min    Comment: (NOTE) Calculated using the CKD-EPI Creatinine Equation (2021)    Anion gap 11 5 - 15    Comment: Performed at [facility], [address] [place], [place] [address]  Procalcitonin     Status: None   Collection Time: 04/07/20  3:48 AM  Result Value Ref Range   Procalcitonin <0.10 ng/mL    Comment:        Interpretation: PCT (Procalcitonin) <= 0.5 ng/mL: Systemic infection (sepsis) is not likely. Local bacterial infection is possible. (NOTE)       Sepsis PCT Algorithm           Lower Respiratory Tract                                      Infection PCT Algorithm    ----------------------------     ----------------------------         PCT < 0.25 ng/mL                PCT < 0.10 ng/mL          Strongly encourage             Strongly discourage   discontinuation of antibiotics    initiation of antibiotics    ----------------------------     -----------------------------       PCT 0.25 - 0.50 ng/mL            PCT 0.10 - 0.25 ng/mL               OR       >80% decrease in PCT            Discourage initiation of                                            antibiotics      Encourage discontinuation           of antibiotics    ----------------------------     -----------------------------         PCT  >= 0.50 ng/mL              PCT 0.26 - 0.50 ng/mL               AND        <80% decrease in PCT  Encourage initiation of                                             antibiotics       Encourage continuation           of antibiotics    ----------------------------     -----------------------------        PCT >= 0.50 ng/mL                  PCT > 0.50 ng/mL               AND         increase in PCT                  Strongly encourage                                      initiation of antibiotics    Strongly encourage escalation           of antibiotics                                     -----------------------------                                           PCT <= 0.25 ng/mL                                                 OR                                        > 80% decrease in PCT                                      Discontinue / Do not initiate                                             antibiotics  Performed at [facility], [address], [place], [place] [address]   Magnesium     Status: None   Collection Time: 04/07/20  3:48 AM  Result Value Ref Range   Magnesium 1.8 1.7 - 2.4 mg/dL    Comment: Performed at [facility], [address], [place], [place] [address]    Current Facility-Administered Medications  Medication Dose Route Frequency Provider Last Rate Last Admin  . 0.9 %  sodium chloride infusion   Intravenous Continuous Pahwani, Rinka R, MD 125 mL/hr at 04/07/20 1241 Rate Change at 04/07/20 1241  . acetaminophen (TYLENOL) suppository 650 mg  650 mg Rectal Q6H PRN Pahwani, Rinka R, MD   650 mg at 04/06/20 1531  .  acetaminophen (TYLENOL) tablet 650 mg  650 mg Oral Q6H PRN Lang Snow, NP   650 mg at 04/05/20 2220  . benztropine (COGENTIN) tablet 0.5 mg  0.5 mg Oral BID Cox, Amy N, DO   0.5 mg at 04/07/20 0935  . diphenhydrAMINE (BENADRYL) capsule 25 mg  25 mg Oral Q6H PRN Cox, Amy N, DO      . divalproex (DEPAKOTE) DR  tablet 500 mg  500 mg Oral Q12H Cox, Amy N, DO   500 mg at 04/07/20 0934  . enoxaparin (LOVENOX) injection 40 mg  40 mg Subcutaneous Q24H Cox, Amy N, DO   40 mg at 04/06/20 2135  . haloperidol (HALDOL) tablet 5 mg  5 mg Oral TID Cox, Amy N, DO   5 mg at 04/07/20 1529  . hydrOXYzine (ATARAX/VISTARIL) tablet 25 mg  25 mg Oral TID Cox, Amy N, DO   25 mg at 04/07/20 1529  . levothyroxine (SYNTHROID) tablet 88 mcg  88 mcg Oral Q0600 Cox, Amy N, DO   88 mcg at 04/07/20 9147  . loratadine (CLARITIN) tablet 10 mg  10 mg Oral Daily Cox, Amy N, DO   10 mg at 04/07/20 0935  . LORazepam (ATIVAN) injection 2 mg  2 mg Intramuscular PRN Corrion Stirewalt, Madie Reno, MD   2 mg at 04/06/20 0802  . morphine 2 MG/ML injection 2 mg  2 mg Intravenous Q4H PRN Pahwani, Rinka R, MD      . piperacillin-tazobactam (ZOSYN) IVPB 3.375 g  3.375 g Intravenous Q8H Duncan, Asajah R, RPH 12.5 mL/hr at 04/07/20 1530 3.375 g at 04/07/20 1530  . ziprasidone (GEODON) injection 20 mg  20 mg Intramuscular PRN Natash Berman, Madie Reno, MD   20 mg at 04/06/20 0810    Musculoskeletal: Strength & Muscle Tone: decreased Gait & Station: normal Patient leans: N/A  Psychiatric Specialty Exam: Physical Exam Vitals and nursing note reviewed.  Constitutional:      Appearance: She is well-developed.  HENT:     Head: Normocephalic and atraumatic.  Eyes:     Conjunctiva/sclera: Conjunctivae normal.     Pupils: Pupils are equal, round, and reactive to light.  Cardiovascular:     Heart sounds: Normal heart sounds.  Pulmonary:     Effort: Pulmonary effort is normal.  Abdominal:     Palpations: Abdomen is soft.  Musculoskeletal:        General: Normal range of motion.     Cervical back: Normal range of motion.  Skin:    General: Skin is warm and dry.  Neurological:     General: No focal deficit present.     Mental Status: She is alert.  Psychiatric:        Mood and Affect: Mood normal.     Review of Systems  Constitutional: Negative.   HENT:  Negative.   Eyes: Negative.   Respiratory: Negative.   Cardiovascular: Negative.   Gastrointestinal: Negative.   Musculoskeletal: Negative.   Skin: Negative.   Neurological: Negative.   Psychiatric/Behavioral: Negative.     Blood pressure 99/62, pulse (!) 103, temperature 98 F (36.7 C), temperature source Oral, resp. rate 18, height 5\' 1"  (1.549 m), weight 63.5 kg, SpO2 100 %.Body mass index is 26.45 kg/m.  General Appearance: Casual  Eye Contact:  Fair  Speech:  Slow  Volume:  Decreased  Mood:  Euthymic  Affect:  Congruent  Thought Process:  Goal Directed  Orientation:  Full (Time, Place, and Person)  Thought Content:  Tangential  Suicidal Thoughts:  No  Homicidal Thoughts:  No  Memory:  Immediate;   Fair Recent;   Fair Remote;   Fair  Judgement:  Impaired  Insight:  Shallow  Psychomotor Activity:  Decreased  Concentration:  Concentration: Fair  Recall:  [employer] of Knowledge:  Fair  Language:  Fair  Akathisia:  No  Handed:  Right  AIMS (if indicated):     Assets:  Desire for Improvement Housing Resilience Social Support  ADL's:  Impaired  Cognition:  Impaired,  Mild  Sleep:        Treatment Plan Summary: Plan Patient currently is doing very well in terms of her behavior.  She may not require the sitter I will not discontinue it now but we can readdress this problem tomorrow.  She is not under IVC and if her behavior is fine we could potentially get rid of the one-to-one sitter which would free up a staff member.  No change in any orders to any medicine.  Given her current behavior I would not anticipate her needing return psychiatric hospitalization during this admission.  We will continue to follow-up as needed..  Disposition: No evidence of imminent risk to self or others at present.   Supportive therapy provided about ongoing stressors. Discussed crisis plan, support from social network, calling 911, coming to the Emergency Department, and calling Suicide  Hotline.  Alethia Berthold, MD 04/07/2020 5:02 PM

## 2020-04-08 DIAGNOSIS — F200 Paranoid schizophrenia: Secondary | ICD-10-CM | POA: Diagnosis not present

## 2020-04-08 DIAGNOSIS — F70 Mild intellectual disabilities: Secondary | ICD-10-CM | POA: Diagnosis not present

## 2020-04-08 LAB — BASIC METABOLIC PANEL
Anion gap: 9 (ref 5–15)
BUN: <5 mg/dL — ABNORMAL LOW (ref 6–20)
CO2: 25 mmol/L (ref 22–32)
Calcium: 8.6 mg/dL — ABNORMAL LOW (ref 8.9–10.3)
Chloride: 99 mmol/L (ref 98–111)
Creatinine, Ser: 0.56 mg/dL (ref 0.44–1.00)
GFR, Estimated: >60 mL/min (ref >60)
Glucose, Bld: 100 mg/dL — ABNORMAL HIGH (ref 70–99)
Potassium: 3.4 mmol/L — ABNORMAL LOW (ref 3.5–5.1)
Sodium: 133 mmol/L — ABNORMAL LOW (ref 135–145)

## 2020-04-08 LAB — CBC
HCT: 28.1 % — ABNORMAL LOW (ref 36.0–46.0)
Hemoglobin: 9.6 g/dL — ABNORMAL LOW (ref 12.0–15.0)
MCH: 30.6 pg (ref 26.0–34.0)
MCHC: 34.2 g/dL (ref 30.0–36.0)
MCV: 89.5 fL (ref 80.0–100.0)
Platelets: 254 10*3/uL (ref 150–400)
RBC: 3.14 MIL/uL — ABNORMAL LOW (ref 3.87–5.11)
RDW: 12.8 % (ref 11.5–15.5)
WBC: 13.6 10*3/uL — ABNORMAL HIGH (ref 4.0–10.5)
nRBC: 0 % (ref 0.0–0.2)

## 2020-04-08 LAB — URINE CULTURE

## 2020-04-08 LAB — PROCALCITONIN: Procalcitonin: 0.24 ng/mL

## 2020-04-08 MED ORDER — POTASSIUM CHLORIDE 20 MEQ PO PACK
40.0000 meq | PACK | Freq: Once | ORAL | Status: AC
  Administered 2020-04-08: 40 meq via ORAL
  Filled 2020-04-08: qty 2

## 2020-04-08 NOTE — Progress Notes (Signed)
PROGRESS NOTE    Tamara [name]  ZHY:[id number] DOB: December 10, 1970 DOA: 04/03/2020 PCP: [facility]   Brief Narrative:  Tamara [name] is a 50 y.o. female with medical history significant for hypothyroid congenital, mild intellectual disability, schizophrenia, presented to the emergency department acutely and agitated with screaming. Patient was emergently IVC due to concern for acute psychosis.  While EKG leads were being place, left breast mass was discovered. Ultrasound showed mass concerning for malignancy.  Patient admitted for further inpatient work-up of left breast mass.  Assessment & Plan:   Left breast mass: Concerning for malignancy -S/p ultrasound-guided core needle biopsy of left breast mass and axillary lymph node by IR on 11/12.  -ultrasound of left breast shows large solid mass at 1 o'clock position concerning for malignancy.  -Reviewed CT chest/abdomen/pelvis shows: Large left breast mass is identified compatible with primary breast carcinoma.  This extends posteriorly to the pectoralis major and extending anteriorly to the skin surface.  Cannot exclude chest wall involvement.  Enlarged left axillary and left retropectoral and left internal mammary lymph nodes compatible with metastatic adenopathy. -Appreciate general surgery recommendations-no surgical management at this time.  -Appreciate oncology recommendations. -Await biopsy results.  SIRS: -UTI? -Patient febrile, tachycardic, hypotensive and has leukocytosis. -Chest x-ray negative for pneumonia, lactic acid PCT: WNL, blood culture: Negative so far, UA concerning for infection.  Positive for leukocytes, bacteria and WBC.  Urine culture: Shows multiple species.  Repeat urine culture ordered. -Continue IV fluids and IV antibiotics.  Remains afebrile.  Her leukocytosis, heart rate and blood pressure has improved. -Continue to monitor.  Schizophrenia - 1:1 observation ordered -Continued home  meds-Depakote, Haldol, hydroxyzine. -Appreciate psych input  Hyponatremia: Chronic but improving. -patient is asymptomatic.   -Urine sodium: WNL.  Continue to monitor.  Congenital hypothyroidism-resume levothyroxine  Hypokalemia: Potassium: 3.4.  Replenished.  Magnesium level: WNL  Mild intellectual disability-appears at baseline  DVT prophylaxis: Lovenox Code Status: Full code Family Communication:  None present at bedside.  Plan of care discussed with patient in length and she verbalized understanding and agreed with it.  Disposition Plan: Home  Consultants:  General surgery Oncology Palliative care  Procedures:   Breast ultrasound  CT chest/abdomen/pelvis  Ultrasound guided core biopsy of left breast mass and axillary lymph node.  Antimicrobials:   zosyn  Status is: Inpatient  Dispo: The patient is from: Home              Anticipated d/c is to: home?              Anticipated d/c date is: More than 3 days              Patient currently not medically stable for the discharge.   Subjective: Patient seen and examined.  Resting comfortably on the bed.  Has no new complaints.  Remained afebrile overnight.  Objective: Vitals:   04/07/20 1600 04/07/20 2007 04/08/20 0027 04/08/20 0854  BP: 123/60 (!) 118/58 (!) 102/58 101/65  Pulse: (!) 104 (!) 103 (!) 104 88  Resp: 16 20 18 18   Temp: 98.6 F (37 C) 98.5 F (36.9 C) 98.7 F (37.1 C) 98.6 F (37 C)  TempSrc: Oral Oral Axillary Oral  SpO2: 100% 100% 100% 98%  Weight:      [name]:        Intake/Output Summary (Last 24 hours) at 04/08/2020 1131 Last data filed at 04/08/2020 1015 Gross per 24 hour  Intake 1400.77 ml  Output 1002 ml  Net 398.77  ml   Filed Weights   04/03/20 1523  Weight: 63.5 kg    Examination:  General exam: Appears calm and comfortable, drowsy but arousable, on room air, appears weak and tired Respiratory system: Clear to auscultation. Respiratory effort normal. Cardiovascular  system: S1 & S2 heard, RRR. No JVD, murmurs, rubs, gallops or clicks. No pedal edema. Gastrointestinal system: Abdomen is nondistended, soft and nontender. No organomegaly or masses felt. Normal bowel sounds heard. Chest: Left breast: Left breast mass- firm, erythematous, No signs of active bleeding or discharge seen. Central nervous system: Alert and oriented. No focal neurological deficits. Extremities: Symmetric 5 x 5 power.  Data Reviewed: I have personally reviewed following labs and imaging studies  CBC: Recent Labs  Lab 04/03/20 1540 04/05/20 0400 04/06/20 0434 04/07/20 0348 04/08/20 0619  WBC 12.1* 7.6 17.1* 16.8* 13.6*  NEUTROABS 9.2*  --   --   --   --   HGB 12.5 11.3* 12.3 11.0* 9.6*  HCT 35.8* 32.4* 35.6* 31.5* 28.1*  MCV 87.5 89.3 89.0 88.5 89.5  PLT 342 297 322 273 161   Basic Metabolic Panel: Recent Labs  Lab 04/03/20 1540 04/05/20 0400 04/06/20 0434 04/07/20 0348 04/08/20 0619  NA 125* 131* 127* 132* 133*  K 3.1* 3.9 3.5 3.4* 3.4*  CL 88* 97* 92* 97* 99  CO2 24 26 27 24 25   GLUCOSE 153* 88 108* 114* 100*  BUN <5* <5* <5* 6 <5*  CREATININE 0.65 0.48 0.60 0.71 0.56  CALCIUM 9.5 9.1 9.1 8.6* 8.6*  MG  --  2.1  --  1.8  --    GFR: Estimated Creatinine Clearance: 73.4 mL/min (by C-G formula based on SCr of 0.56 mg/dL). Liver Function Tests: Recent Labs  Lab 04/03/20 1540  AST 25  ALT 13  ALKPHOS 68  BILITOT 0.4  PROT 8.6*  ALBUMIN 4.2   No results for input(s): LIPASE, AMYLASE in the last 168 hours. No results for input(s): AMMONIA in the last 168 hours. Coagulation Profile: No results for input(s): INR, PROTIME in the last 168 hours. Cardiac Enzymes: No results for input(s): CKTOTAL, CKMB, CKMBINDEX, TROPONINI in the last 168 hours. BNP (last 3 results) No results for input(s): PROBNP in the last 8760 hours. HbA1C: No results for input(s): HGBA1C in the last 72 hours. CBG: No results for input(s): GLUCAP in the last 168 hours. Lipid  Profile: No results for input(s): CHOL, HDL, LDLCALC, TRIG, CHOLHDL, LDLDIRECT in the last 72 hours. Thyroid Function Tests: No results for input(s): TSH, T4TOTAL, FREET4, T3FREE, THYROIDAB in the last 72 hours. Anemia Panel: No results for input(s): VITAMINB12, FOLATE, FERRITIN, TIBC, IRON, RETICCTPCT in the last 72 hours. Sepsis Labs: Recent Labs  Lab 04/06/20 0434 04/06/20 0950 04/07/20 0348 04/08/20 0619  PROCALCITON <0.10  --  <0.10 0.24  LATICACIDVEN  --  1.8  --   --     Recent Results (from the past 240 hour(s))  Respiratory Panel by RT PCR (Flu A&B, Covid) - Nasopharyngeal Swab     Status: None   Collection Time: 04/03/20  3:43 PM   Specimen: Nasopharyngeal Swab  Result Value Ref Range Status   SARS Coronavirus 2 by RT PCR NEGATIVE NEGATIVE Final    Comment: (NOTE) SARS-CoV-2 target nucleic acids are NOT DETECTED.  The SARS-CoV-2 RNA is generally detectable in upper respiratoy specimens during the acute phase of infection. The lowest concentration of SARS-CoV-2 viral copies this assay can detect is 131 copies/mL. A negative result does not preclude SARS-Cov-2  infection and should not be used as the sole basis for treatment or other patient management decisions. A negative result may occur with  improper specimen collection/handling, submission of specimen other than nasopharyngeal swab, presence of viral mutation(s) within the areas targeted by this assay, and inadequate number of viral copies (<131 copies/mL). A negative result must be combined with clinical observations, patient history, and epidemiological information. The expected result is Negative.  Fact Sheet for Patients:  PinkCheek.be  Fact Sheet for Healthcare Providers:  GravelBags.it  This test is no t yet approved or cleared by the [place] FDA and  has been authorized for detection and/or diagnosis of SARS-CoV-2 by FDA under an  Emergency Use Authorization (EUA). This EUA will remain  in effect (meaning this test can be used) for the duration of the COVID-19 declaration under Section 564(b)(1) of the Act, 21 U.S.C. section 360bbb-3(b)(1), unless the authorization is terminated or revoked sooner.     Influenza A by PCR NEGATIVE NEGATIVE Final   Influenza B by PCR NEGATIVE NEGATIVE Final    Comment: (NOTE) The Xpert Xpress SARS-CoV-2/FLU/RSV assay is intended as an aid in  the diagnosis of influenza from Nasopharyngeal swab specimens and  should not be used as a sole basis for treatment. Nasal washings and  aspirates are unacceptable for Xpert Xpress SARS-CoV-2/FLU/RSV  testing.  Fact Sheet for Patients: PinkCheek.be  Fact Sheet for Healthcare Providers: GravelBags.it  This test is not yet approved or cleared by the [place] FDA and  has been authorized for detection and/or diagnosis of SARS-CoV-2 by  FDA under an Emergency Use Authorization (EUA). This EUA will remain  in effect (meaning this test can be used) for the duration of the  Covid-19 declaration under Section 564(b)(1) of the Act, 21  U.S.C. section 360bbb-3(b)(1), unless the authorization is  terminated or revoked. Performed at [facility], [address] [place], [place] [address]   CULTURE, BLOOD (ROUTINE X 2) w Reflex to ID Panel     Status: None (Preliminary result)   Collection Time: 04/06/20  9:51 AM   Specimen: BLOOD  Result Value Ref Range Status   Specimen Description BLOOD BLOOD RIGHT HAND  Final   Special Requests   Final    BOTTLES DRAWN AEROBIC AND ANAEROBIC Blood Culture results may not be optimal due to an inadequate volume of blood received in culture bottles   Culture   Final    NO GROWTH 2 DAYS Performed at [facility], [address], [place], [place] [address]    Report Status PENDING  Incomplete  CULTURE, BLOOD (ROUTINE X 2) w Reflex  to ID Panel     Status: None (Preliminary result)   Collection Time: 04/06/20 10:48 AM   Specimen: BLOOD  Result Value Ref Range Status   Specimen Description BLOOD BLOOD RIGHT HAND  Final   Special Requests   Final    BOTTLES DRAWN AEROBIC AND ANAEROBIC Blood Culture adequate volume   Culture   Final    NO GROWTH 2 DAYS Performed at [facility], [address], [place], [place] [address]    Report Status PENDING  Incomplete  Urine Culture     Status: Abnormal   Collection Time: 04/07/20  9:30 AM   Specimen: Urine, Random  Result Value Ref Range Status   Specimen Description   Final    URINE, RANDOM Performed at [facility], [address] [place], [place] [address]    Special Requests  Final    NONE Performed at [facility], [address] [place], [place] [address]    Culture MULTIPLE SPECIES PRESENT, SUGGEST RECOLLECTION (A)  Final   Report Status 04/08/2020 FINAL  Final      Radiology Studies: DG Chest 1 View  Result Date: 04/06/2020 CLINICAL DATA:  Possible pneumonia EXAM: CHEST  1 VIEW COMPARISON:  04/04/2020 FINDINGS: Cardiac shadow is within normal limits. The lungs are well aerated bilaterally. No focal infiltrate or effusion is seen. No bony abnormality is noted. IMPRESSION: No acute abnormality seen. Electronically Signed   By: Inez Catalina M.D.   On: 04/06/2020 16:20    Scheduled Meds: . benztropine  0.5 mg Oral BID  . divalproex  500 mg Oral Q12H  . enoxaparin (LOVENOX) injection  40 mg Subcutaneous Q24H  . haloperidol  5 mg Oral TID  . hydrOXYzine  25 mg Oral TID  . levothyroxine  88 mcg Oral Q0600  . loratadine  10 mg Oral Daily   Continuous Infusions: . sodium chloride 125 mL/hr at 04/08/20 1128  . piperacillin-tazobactam (ZOSYN)  IV 3.375 g (04/08/20 0857)     LOS: 4 days   Time spent: 35 minutes   Ravleen Ries Loann Quill, MD [facility]  If 7PM-7AM, please contact night-coverage [email or web address] 04/08/2020,  11:31 AM

## 2020-04-08 NOTE — Consult Note (Signed)
[facility] Consult   Reason for Consult: Follow-up consult for patient with intellectual disability and schizophrenia.  Patient seen chart reviewed.  Patient continues to have no complaint Referring Physician:  Pahwani Patient Identification: Tamara [name] MRN:  [id number] Principal Diagnosis: Mild intellectual disability Diagnosis:  Principal Problem:   Mild intellectual disability Active Problems:   Schizophrenia, paranoid type ([place])   Congenital hypothyroidism without goiter   Schizophrenia ([place])   Large mass of left breast   Palliative care encounter   Breast mass in female   Total Time spent with patient: 30 minutes  Subjective:   Tamara [name] is a 49 y.o. female patient admitted with "I am okay.  I want to go home".  HPI: Patient seen chart reviewed.  Spoke with sitter.  Patient has been calm and sedated withdrawn.  No agitation no aggressive behavior.  No violence.  Tolerating medication well.  Patient denies any pain.  Seems to have only a vague idea of her current medical predicament  Past Psychiatric History: Longstanding problems with intellectual disability behavior problems psychotic symptoms  Risk to Self:   Risk to Others:   Prior Inpatient Therapy:   Prior Outpatient Therapy:    Past Medical History:  Past Medical History:  Diagnosis Date   GERD (gastroesophageal reflux disease)    IUD (intrauterine device) in place IUD was placed in 2016   Leiomyoma of uterus    Schizophrenia ([place])    Thyroid disease    No past surgical history on file. Family History: No family history on file. Family Psychiatric  History: See previous Social History:  Social History   Substance and Sexual Activity  Alcohol Use No     Social History   Substance and Sexual Activity  Drug Use No    Social History   Socioeconomic History   Marital status: Single    Spouse name: Not on file   Number of children: Not on file   Years of education:  Not on file   Highest education level: Not on file  Occupational History   Not on file  Tobacco Use   Smoking status: Never Smoker   Smokeless tobacco: Never Used  Substance and Sexual Activity   Alcohol use: No   Drug use: No   Sexual activity: Yes    Birth control/protection: Surgical  Other Topics Concern   Not on file  Social History Narrative   Not on file   Social Determinants of Health   Financial Resource Strain:    Difficulty of Paying Living Expenses: Not on file  Food Insecurity:    Worried About [place] in the Last Year: Not on file   [employer] of Food in the Last Year: Not on file  Transportation Needs:    Lack of Transportation (Medical): Not on file   Lack of Transportation (Non-Medical): Not on file  Physical Activity:    Days of Exercise per Week: Not on file   Minutes of Exercise per Session: Not on file  Stress:    Feeling of Stress : Not on file  Social Connections:    Frequency of Communication with Friends and Family: Not on file   Frequency of Social Gatherings with Friends and Family: Not on file   Attends Religious Services: Not on file   Active Member of Clubs or Organizations: Not on file   Attends Archivist Meetings: Not on file   Marital Status: Not on file   Additional  Social History:    Allergies:  No Known Allergies  Labs:  Results for orders placed or performed during the hospital encounter of 04/03/20 (from the past 48 hour(s))  Urinalysis, Complete w Microscopic Urine, Clean Catch     Status: Abnormal   Collection Time: 04/06/20  3:30 PM  Result Value Ref Range   Color, Urine YELLOW (A) YELLOW   APPearance TURBID (A) CLEAR   Specific Gravity, Urine 1.008 1.005 - 1.030   pH 5.0 5.0 - 8.0   Glucose, UA NEGATIVE NEGATIVE mg/dL   Hgb urine dipstick MODERATE (A) NEGATIVE   Bilirubin Urine NEGATIVE NEGATIVE   Ketones, ur NEGATIVE NEGATIVE mg/dL   Protein, ur 30 (A) NEGATIVE mg/dL    Nitrite NEGATIVE NEGATIVE   Leukocytes,Ua LARGE (A) NEGATIVE   RBC / HPF 0-5 0 - 5 RBC/hpf   WBC, UA 21-50 0 - 5 WBC/hpf   Bacteria, UA MANY (A) NONE SEEN   Squamous Epithelial / LPF 0-5 0 - 5   WBC Clumps PRESENT     Comment: Performed at [facility], [address] [place], [place] [address]  CBC     Status: Abnormal   Collection Time: 04/07/20  3:48 AM  Result Value Ref Range   WBC 16.8 (H) 4.0 - 10.5 K/uL   RBC 3.56 (L) 3.87 - 5.11 MIL/uL   Hemoglobin 11.0 (L) 12.0 - 15.0 g/dL   HCT 31.5 (L) 36 - 46 %   MCV 88.5 80.0 - 100.0 fL   MCH 30.9 26.0 - 34.0 pg   MCHC 34.9 30.0 - 36.0 g/dL   RDW 12.8 11.5 - 15.5 %   Platelets 273 150 - 400 K/uL   nRBC 0.0 0.0 - 0.2 %    Comment: Performed at [facility], [address] [place], [place] [address]  Basic metabolic panel     Status: Abnormal   Collection Time: 04/07/20  3:48 AM  Result Value Ref Range   Sodium 132 (L) 135 - 145 mmol/L   Potassium 3.4 (L) 3.5 - 5.1 mmol/L   Chloride 97 (L) 98 - 111 mmol/L   CO2 24 22 - 32 mmol/L   Glucose, Bld 114 (H) 70 - 99 mg/dL    Comment: Glucose reference range applies only to samples taken after fasting for at least 8 hours.   BUN 6 6 - 20 mg/dL   Creatinine, Ser 0.71 0.44 - 1.00 mg/dL   Calcium 8.6 (L) 8.9 - 10.3 mg/dL   GFR, Estimated >60 >60 mL/min    Comment: (NOTE) Calculated using the CKD-EPI Creatinine Equation (2021)    Anion gap 11 5 - 15    Comment: Performed at [facility], [address] [place], [place] [address]  Procalcitonin     Status: None   Collection Time: 04/07/20  3:48 AM  Result Value Ref Range   Procalcitonin <0.10 ng/mL    Comment:        Interpretation: PCT (Procalcitonin) <= 0.5 ng/mL: Systemic infection (sepsis) is not likely. Local bacterial infection is possible. (NOTE)       Sepsis PCT Algorithm           Lower Respiratory Tract                                      Infection PCT Algorithm     ----------------------------     ----------------------------  PCT < 0.25 ng/mL                PCT < 0.10 ng/mL          Strongly encourage             Strongly discourage   discontinuation of antibiotics    initiation of antibiotics    ----------------------------     -----------------------------       PCT 0.25 - 0.50 ng/mL            PCT 0.10 - 0.25 ng/mL               OR       >80% decrease in PCT            Discourage initiation of                                            antibiotics      Encourage discontinuation           of antibiotics    ----------------------------     -----------------------------         PCT >= 0.50 ng/mL              PCT 0.26 - 0.50 ng/mL               AND        <80% decrease in PCT             Encourage initiation of                                             antibiotics       Encourage continuation           of antibiotics    ----------------------------     -----------------------------        PCT >= 0.50 ng/mL                  PCT > 0.50 ng/mL               AND         increase in PCT                  Strongly encourage                                      initiation of antibiotics    Strongly encourage escalation           of antibiotics                                     -----------------------------                                           PCT <= 0.25 ng/mL  OR                                        > 80% decrease in PCT                                      Discontinue / Do not initiate                                             antibiotics  Performed at [facility], [address] [place], [place] [address]   Magnesium     Status: None   Collection Time: 04/07/20  3:48 AM  Result Value Ref Range   Magnesium 1.8 1.7 - 2.4 mg/dL    Comment: Performed at [facility], [address] [place], [place] [address]  Urine Culture     Status: Abnormal   Collection  Time: 04/07/20  9:30 AM   Specimen: Urine, Random  Result Value Ref Range   Specimen Description      URINE, RANDOM Performed at [facility], [address], [place], [place] [address]    Special Requests      NONE Performed at [facility], [address] [place], [place] [address]    Culture MULTIPLE SPECIES PRESENT, SUGGEST RECOLLECTION (A)    Report Status 04/08/2020 FINAL   Procalcitonin     Status: None   Collection Time: 04/08/20  6:19 AM  Result Value Ref Range   Procalcitonin 0.24 ng/mL    Comment:        Interpretation: PCT (Procalcitonin) <= 0.5 ng/mL: Systemic infection (sepsis) is not likely. Local bacterial infection is possible. (NOTE)       Sepsis PCT Algorithm           Lower Respiratory Tract                                      Infection PCT Algorithm    ----------------------------     ----------------------------         PCT < 0.25 ng/mL                PCT < 0.10 ng/mL          Strongly encourage             Strongly discourage   discontinuation of antibiotics    initiation of antibiotics    ----------------------------     -----------------------------       PCT 0.25 - 0.50 ng/mL            PCT 0.10 - 0.25 ng/mL               OR       >80% decrease in PCT            Discourage initiation of                                            antibiotics  Encourage discontinuation           of antibiotics    ----------------------------     -----------------------------         PCT >= 0.50 ng/mL              PCT 0.26 - 0.50 ng/mL               AND        <80% decrease in PCT             Encourage initiation of                                             antibiotics       Encourage continuation           of antibiotics    ----------------------------     -----------------------------        PCT >= 0.50 ng/mL                  PCT > 0.50 ng/mL               AND         increase in PCT                  Strongly encourage                                       initiation of antibiotics    Strongly encourage escalation           of antibiotics                                     -----------------------------                                           PCT <= 0.25 ng/mL                                                 OR                                        > 80% decrease in PCT                                      Discontinue / Do not initiate                                             antibiotics  Performed at [facility], [address], [place], [place] [address]   CBC     Status: Abnormal   Collection Time: 04/08/20  6:19 AM  Result Value  Ref Range   WBC 13.6 (H) 4.0 - 10.5 K/uL   RBC 3.14 (L) 3.87 - 5.11 MIL/uL   Hemoglobin 9.6 (L) 12.0 - 15.0 g/dL   HCT 28.1 (L) 36 - 46 %   MCV 89.5 80.0 - 100.0 fL   MCH 30.6 26.0 - 34.0 pg   MCHC 34.2 30.0 - 36.0 g/dL   RDW 12.8 11.5 - 15.5 %   Platelets 254 150 - 400 K/uL   nRBC 0.0 0.0 - 0.2 %    Comment: Performed at [facility], [address] [place], [place] [address]  Basic metabolic panel     Status: Abnormal   Collection Time: 04/08/20  6:19 AM  Result Value Ref Range   Sodium 133 (L) 135 - 145 mmol/L   Potassium 3.4 (L) 3.5 - 5.1 mmol/L   Chloride 99 98 - 111 mmol/L   CO2 25 22 - 32 mmol/L   Glucose, Bld 100 (H) 70 - 99 mg/dL    Comment: Glucose reference range applies only to samples taken after fasting for at least 8 hours.   BUN <5 (L) 6 - 20 mg/dL   Creatinine, Ser 0.56 0.44 - 1.00 mg/dL   Calcium 8.6 (L) 8.9 - 10.3 mg/dL   GFR, Estimated >60 >60 mL/min    Comment: (NOTE) Calculated using the CKD-EPI Creatinine Equation (2021)    Anion gap 9 5 - 15    Comment: Performed at [facility], [address] [place],  [address]    Current Facility-Administered Medications  Medication Dose Route Frequency Provider Last Rate Last Admin   0.9 %  sodium chloride infusion   Intravenous Continuous Pahwani, Rinka R, MD  125 mL/hr at 04/08/20 1128 New Bag at 04/08/20 1128   acetaminophen (TYLENOL) suppository 650 mg  650 mg Rectal Q6H PRN Pahwani, Rinka R, MD   650 mg at 04/06/20 1531   acetaminophen (TYLENOL) tablet 650 mg  650 mg Oral Q6H PRN Lang Snow, NP   650 mg at 04/05/20 2220   benztropine (COGENTIN) tablet 0.5 mg  0.5 mg Oral BID Cox, Amy N, DO   0.5 mg at 04/08/20 0854   diphenhydrAMINE (BENADRYL) capsule 25 mg  25 mg Oral Q6H PRN Cox, Amy N, DO       divalproex (DEPAKOTE) DR tablet 500 mg  500 mg Oral Q12H Cox, Amy N, DO   500 mg at 04/08/20 0854   enoxaparin (LOVENOX) injection 40 mg  40 mg Subcutaneous Q24H Cox, Amy N, DO   40 mg at 04/07/20 2102   haloperidol (HALDOL) tablet 5 mg  5 mg Oral TID Cox, Amy N, DO   5 mg at 04/08/20 0854   hydrOXYzine (ATARAX/VISTARIL) tablet 25 mg  25 mg Oral TID Cox, Amy N, DO   25 mg at 04/08/20 0854   levothyroxine (SYNTHROID) tablet 88 mcg  88 mcg Oral Q0600 Cox, Amy N, DO   88 mcg at 04/08/20 0510   loratadine (CLARITIN) tablet 10 mg  10 mg Oral Daily Cox, Amy N, DO   10 mg at 04/08/20 0854   LORazepam (ATIVAN) injection 2 mg  2 mg Intramuscular PRN Earle Burson T, MD   2 mg at 04/06/20 0802   morphine 2 MG/ML injection 2 mg  2 mg Intravenous Q4H PRN Pahwani, Rinka R, MD       piperacillin-tazobactam (ZOSYN) IVPB 3.375 g  3.375 g Intravenous Q8H Duncan, Asajah R, RPH 12.5 mL/hr at 04/08/20 0857 3.375  g at 04/08/20 0857   potassium chloride (KLOR-CON) packet 40 mEq  40 mEq Oral Once Pahwani, Rinka R, MD       ziprasidone (GEODON) injection 20 mg  20 mg Intramuscular PRN Shirlena Brinegar, Madie Reno, MD   20 mg at 04/06/20 0810    Musculoskeletal: Strength & Muscle Tone: within normal limits Gait & Station: normal Patient leans: N/A  Psychiatric Specialty Exam: Physical Exam Vitals and nursing note reviewed.  Constitutional:      Appearance: She is well-developed.  HENT:     Head: Normocephalic and atraumatic.  Eyes:      Conjunctiva/sclera: Conjunctivae normal.     Pupils: Pupils are equal, round, and reactive to light.  Cardiovascular:     Heart sounds: Normal heart sounds.  Pulmonary:     Effort: Pulmonary effort is normal.  Abdominal:     Palpations: Abdomen is soft.  Musculoskeletal:        General: Normal range of motion.     Cervical back: Normal range of motion.  Skin:    General: Skin is warm and dry.  Neurological:     General: No focal deficit present.     Mental Status: She is alert.  Psychiatric:        Attention and Perception: She is inattentive.        Mood and Affect: Affect is blunt.        Speech: Speech is delayed.        Behavior: Behavior is slowed.        Thought Content: Thought content is not paranoid. Thought content does not include homicidal or suicidal ideation.        Cognition and Memory: Cognition is impaired. Memory is impaired.     Review of Systems  Constitutional: Negative.   HENT: Negative.   Eyes: Negative.   Respiratory: Negative.   Cardiovascular: Negative.   Gastrointestinal: Negative.   Musculoskeletal: Negative.   Skin: Negative.   Neurological: Negative.   Psychiatric/Behavioral: Negative.     Blood pressure 101/65, pulse 88, temperature 98.6 F (37 C), temperature source Oral, resp. rate 18, height 5\' 1"  (1.549 m), weight 63.5 kg, SpO2 98 %.Body mass index is 26.45 kg/m.  General Appearance: Casual  Eye Contact:  Fair  Speech:  Slow  Volume:  Decreased  Mood:  Euthymic  Affect:  Constricted  Thought Process:  Coherent  Orientation:  Full (Time, Place, and Person)  Thought Content:  Logical  Suicidal Thoughts:  No  Homicidal Thoughts:  No  Memory:  Immediate;   Fair Recent;   Fair Remote;   Fair  Judgement:  Fair  Insight:  Fair  Psychomotor Activity:  Decreased  Concentration:  Concentration: Fair  Recall:  [employer] of Knowledge:  Fair  Language:  Fair  Akathisia:  No  Handed:  Right  AIMS (if indicated):     Assets:  Desire  for Improvement Housing Resilience Social Support  ADL's:  Impaired  Cognition:  Impaired,  Mild  Sleep:        Treatment Plan Summary: Medication management and Plan Patient is psychiatrically cleared and does not need inpatient treatment.  She remains under IVC which at this point is largely required because we do not have a signature from her guardian.  From a practical standpoint could probably do without the sitter.  If nursing would like to discuss this and the possibility of stopping the commitment early please let me know.  Unclear to me from the  notes when she will be discharged medically.  Supportive counseling and encouragement to the patient with no change to medication orders.  Disposition: Patient does not meet criteria for psychiatric inpatient admission. Discussed crisis plan, support from social network, calling 911, coming to the Emergency Department, and calling Suicide Hotline.  Alethia Berthold, MD 04/08/2020 12:31 PM

## 2020-04-08 NOTE — Progress Notes (Signed)
Mobility Specialist - Progress Note   04/08/20 1400  Mobility  Activity Ambulated in room  Range of Motion/Exercises Right leg;Left leg (ankle pumps, straight leg raises, hip iso)  Level of Assistance Standby assist, set-up cues, supervision of patient - no hands on  Assistive Device None  Distance Ambulated (ft) 100 ft  Mobility Response Tolerated well  Mobility performed by Mobility specialist  $Mobility charge 1 Mobility    Pre-mobility: 96 HR, 98% SpO2 During mobility: 107 HR, 96% SpO2 Post-mobility: 101 HR, 99% SpO2   Pt was lying in bed upon arrival with sitter present. Pt agreed to session. Pt denied any pain, nausea, or fatigue. Pt ambulated 100' in room with no LOB. No AD was used for this session. Pt denied any dizziness, SOB, or weakness during activity. Pt was able to get back EOB and completed seated exercises: ankle pumps, hip isometrics, and straight leg raises with minA. Pt needing several cues, guidance, and repeated demonstrations to properly complete exercises. Overall, pt tolerated session well. Pt was left in bed with all needs in reach and sitter present.    Kathee Delton Mobility Specialist 04/08/20, 2:39 PM

## 2020-04-09 DIAGNOSIS — F70 Mild intellectual disabilities: Secondary | ICD-10-CM | POA: Diagnosis not present

## 2020-04-09 LAB — BASIC METABOLIC PANEL
Anion gap: 10 (ref 5–15)
BUN: <5 mg/dL — ABNORMAL LOW (ref 6–20)
CO2: 24 mmol/L (ref 22–32)
Calcium: 8.8 mg/dL — ABNORMAL LOW (ref 8.9–10.3)
Chloride: 101 mmol/L (ref 98–111)
Creatinine, Ser: 0.65 mg/dL (ref 0.44–1.00)
GFR, Estimated: >60 mL/min (ref >60)
Glucose, Bld: 99 mg/dL (ref 70–99)
Potassium: 3.6 mmol/L (ref 3.5–5.1)
Sodium: 135 mmol/L (ref 135–145)

## 2020-04-09 LAB — CBC
HCT: 27.6 % — ABNORMAL LOW (ref 36.0–46.0)
Hemoglobin: 9.5 g/dL — ABNORMAL LOW (ref 12.0–15.0)
MCH: 30.8 pg (ref 26.0–34.0)
MCHC: 34.4 g/dL (ref 30.0–36.0)
MCV: 89.6 fL (ref 80.0–100.0)
Platelets: 252 10*3/uL (ref 150–400)
RBC: 3.08 MIL/uL — ABNORMAL LOW (ref 3.87–5.11)
RDW: 12.6 % (ref 11.5–15.5)
WBC: 10.8 10*3/uL — ABNORMAL HIGH (ref 4.0–10.5)
nRBC: 0 % (ref 0.0–0.2)

## 2020-04-09 LAB — URINE CULTURE: Culture: NO GROWTH

## 2020-04-09 NOTE — Evaluation (Signed)
Physical Therapy Evaluation Patient Details Name: Tamara [name] MRN: [id number] DOB: 04/19/1971 Today's Date: 04/09/2020   History of Present Illness  Pt is a 49 y/o F admitted ot ED on 04/03/20 acutely agitated & screaming. Pt made emergent IVC. Pt found to have L breast lump - ultrasound consistent with malignancy. Pt with L breast biopsy on 04/06/20. PMH: schizophrenia, thyroid disease (congenital hypothyroidism), mild intellectual disability  Clinical Impression  No family present during session but pt able to provide some home set up information. Pt currently requires supervision for gait without AD for increased distances & is independent for bed mobility & transfers. Pt also demonstrates impaired cognition but this could be baseline as pt has hx of mild intellectual disability. At this time pt does not have any additional PT needs, recommend pt d/c home with supervision for gait/stairs.     Follow Up Recommendations No PT follow up (supervision for gait/stairs)    Equipment Recommendations  None recommended by PT    Recommendations for Other Services       Precautions / Restrictions Precautions Precautions: None Restrictions Weight Bearing Restrictions: No      Mobility  Bed Mobility Overal bed mobility: Independent             General bed mobility comments: supine>sit without assistance, HOB slightly elevated but no true need for this    Transfers Overall transfer level: Independent Equipment used: None             General transfer comment: sit<>stand independent  Ambulation/Gait Ambulation/Gait assistance: Supervision Gait Distance (Feet): 330 Feet Assistive device: None Gait Pattern/deviations: Decreased weight shift to left;Step-through pattern;Decreased stride length Gait velocity: decreased      Stairs            Wheelchair Mobility    Modified Rankin (Stroke Patients Only)       Balance Overall balance assessment: Mild deficits  observed, not formally tested Sitting-balance support: Feet supported;No upper extremity supported Sitting balance-Leahy Scale: Normal       Standing balance-Leahy Scale: Good Standing balance comment: gait without AD with supervision without LOB noted                             Pertinent Vitals/Pain Pain Assessment: No/denies pain    Home Living Family/patient expects to be discharged to:: Private residence Living Arrangements: Parent Available Help at Discharge: Family Type of Home: House Home Access: Stairs to enter Entrance Stairs-Rails: Right Entrance Stairs-Number of Steps: 6-7          Prior Function Level of Independence: Independent         Comments: without AD     Hand Dominance        Extremity/Trunk Assessment   Upper Extremity Assessment Upper Extremity Assessment: Overall WFL for tasks assessed    Lower Extremity Assessment Lower Extremity Assessment: Overall WFL for tasks assessed       Communication      Cognition Arousal/Alertness: Awake/alert Behavior During Therapy: WFL for tasks assessed/performed Overall Cognitive Status: History of cognitive impairments - at baseline (pt with mild intellectual disability at baseline, pt oriented to hospital but not city or name of hospital, pt not oriented to month but oriented to self)  General Comments      Exercises     Assessment/Plan    PT Assessment Patent does not need any further PT services  PT Problem List         PT Treatment Interventions      PT Goals (Current goals can be found in the Care Plan section)  Acute Rehab PT Goals Patient Stated Goal: to go home PT Goal Formulation: With patient Time For Goal Achievement: 04/23/20 Potential to Achieve Goals: Good    Frequency     Barriers to discharge        Co-evaluation               AM-PAC PT "6 Clicks" Mobility  Outcome Measure Help needed  turning from your back to your side while in a flat bed without using bedrails?: None Help needed moving from lying on your back to sitting on the side of a flat bed without using bedrails?: None Help needed moving to and from a bed to a chair (including a wheelchair)?: None Help needed standing up from a chair using your arms (e.g., wheelchair or bedside chair)?: None Help needed to walk in hospital room?: None Help needed climbing 3-5 steps with a railing? : None 6 Click Score: 24    End of Session Equipment Utilized During Treatment: Gait belt Activity Tolerance: Patient tolerated treatment well Patient left: in bed;with nursing/sitter in room Nurse Communication: Mobility status      Time: 8295-6213 PT Time Calculation (min) (ACUTE ONLY): 9 min   Charges:   PT Evaluation $PT Eval Low Complexity: [place], PT, DPT 04/09/20, 4:10 PM   Waunita Schooner 04/09/2020, 4:09 PM

## 2020-04-09 NOTE — TOC Initial Note (Signed)
Transition of Care [facility]) - Initial/Assessment Note    Patient Details  Name: Tamara [name] MRN: [id number] Date of Birth: [birth year]  [facility]) CM/SW Contact:    Beverly Sessions, RN Phone Number: 04/09/2020, 1:28 PM  Clinical Narrative:                  Voicemail left x2 for mother Tamara [name].  Spoke with sister Tamara [name].  Per Tamara [name] patient lives at home with her mother and father.   Father and sister provide transportation to appointments.  [facility].   Per sister at baseline patient independent.  Requesting PT eval while patient is here.  MD notified.   Plan for patient to discharge home with her parents at discharge, with support of sister. TOC following for discharge needs       Patient Goals and CMS Choice        Expected Discharge Plan and Services                                                Prior Living Arrangements/Services                       Activities of Daily Living Home Assistive Devices/Equipment: None    Permission Sought/Granted                  Emotional Assessment              Admission diagnosis:  Agitation [R45.1] Breast pain in female [N64.4] Breast mass, left [N63.20] Left breast mass [N63.20] Patient Active Problem List   Diagnosis Date Noted   Breast mass in female    Palliative care encounter    Large mass of left breast 04/03/2020   Schizophrenia ([place]) 06/06/2019   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 [place] [address] Phone: [phone] Fax: [phone]     Social  Determinants of Health (SDOH) Interventions    Readmission Risk Interventions No flowsheet data found.

## 2020-04-09 NOTE — Consult Note (Signed)
Case reviewed with nursing staff and hospitalist.  Behavior has been very appropriate for many days.  We will discontinue involuntary commitment paperwork as patient does not appear to require one-to-one staffing for safety at this point.

## 2020-04-09 NOTE — Progress Notes (Signed)
PROGRESS NOTE    Tamara [name]  KNL:[id number] DOB: 1971/02/05 DOA: 04/03/2020 PCP: [facility]   Brief Narrative:  Tamara [name] is a 49 y.o. female with medical history significant for hypothyroid congenital, mild intellectual disability, schizophrenia, presented to the emergency department acutely and agitated with screaming. Patient was emergently IVC due to concern for acute psychosis.  While EKG leads were being place, left breast mass was discovered. Ultrasound showed mass concerning for malignancy.  Patient admitted for further inpatient work-up of left breast mass.  Assessment & Plan:   Left breast mass: Concerning for malignancy -S/p ultrasound-guided core needle biopsy of left breast mass and axillary lymph node by IR on 11/12.  -ultrasound of left breast shows large solid mass at 1 o'clock position concerning for malignancy.  -Reviewed CT chest/abdomen/pelvis shows: Large left breast mass is identified compatible with primary breast carcinoma.  This extends posteriorly to the pectoralis major and extending anteriorly to the skin surface.  Cannot exclude chest wall involvement.  Enlarged left axillary and left retropectoral and left internal mammary lymph nodes compatible with metastatic adenopathy. -Appreciate general surgery recommendations-no surgical management at this time.  -Appreciate oncology recommendations. Will discuss plan of care with oncology. -Await biopsy results.  SIRS: -Patient febrile, tachycardic, hypotensive and has leukocytosis. -Chest x-ray negative for pneumonia, lactic acid, PCT: WNL, blood culture: Negative so far, UA: Positive for leukocytes, bacteria and WBC.  Urine culture: Shows multiple species.  Repeat urine culture: Negative -Continue IV fluids.  Remains afebrile.  Her leukocytosis, heart rate and blood pressure has improved. -Stop IV antibiotics  Schizophrenia -Continued home meds-Depakote, Haldol, hydroxyzine. -Appreciate  psych input -[name] behavior has been very appropriate for many days.  IVC discontinued-Per psych.  Hyponatremia: Chronic-resolved -patient is asymptomatic.   -Urine sodium: WNL.  Continue to monitor.  Congenital hypothyroidism-resume levothyroxine  Hypokalemia: Potassium: 3.4.  Replenished.  Magnesium level: WNL  Mild intellectual disability-appears at baseline -Evaluated by PT-recommended no PT follow-up.  DVT prophylaxis: Lovenox Code Status: Full code Family Communication:  None present at bedside.  Plan of care discussed with patient in length and she verbalized understanding and agreed with it.  Disposition Plan: Home  Consultants:  General surgery Oncology Palliative care  Procedures:   Breast ultrasound  CT chest/abdomen/pelvis  Ultrasound guided core biopsy of left breast mass and axillary lymph node.  Antimicrobials:   zosyn  Status is: Inpatient  Dispo: The patient is from: Home              Anticipated d/c is to: home?              Anticipated d/c date is: More than 3 days              Patient currently not medically stable for the discharge.   Subjective: Patient seen and examined.  Sleeping comfortably on the bed.  Denies any new complaints including pain in her left breast, bleeding or discharge or nausea or vomiting.  Objective: Vitals:   04/08/20 2356 04/09/20 0317 04/09/20 0847 04/09/20 1149  BP: 105/64 (!) 94/56 107/71 98/65  Pulse: 97 92 89 88  Resp: 18  18 18   Temp: (!) 100.7 F (38.2 C) 99 F (37.2 C) 98.6 F (37 C) 98.2 F (36.8 C)  TempSrc: Oral Oral Oral Oral  SpO2: 98% 96% 98% 97%  Weight:      Height:        Intake/Output Summary (Last 24 hours) at 04/09/2020 1531 Last data filed  at 04/09/2020 1458 Gross per 24 hour  Intake 6617.17 ml  Output 4300 ml  Net 2317.17 ml   Filed Weights   04/03/20 1523  Weight: 63.5 kg    Examination:  General exam: Appears calm and comfortable, sleepy but arousable and  communicating well, on room air Respiratory system: Clear to auscultation. Respiratory effort normal. Cardiovascular system: S1 & S2 heard, RRR. No JVD, murmurs, rubs, gallops or clicks. No pedal edema. Chest: Left breast: Swollen, firm mass noted, nontender, mildly erythematous, nipple everted, no open wound, bandage noted, no active bleeding, discharge seen. Gastrointestinal system: Abdomen is nondistended, soft and nontender. No organomegaly or masses felt. Normal bowel sounds heard. Central nervous system: Alert. No focal neurological deficits. Extremities: Symmetric 5 x 5 power.   Data Reviewed: I have personally reviewed following labs and imaging studies  CBC: Recent Labs  Lab 04/03/20 1540 04/03/20 1540 04/05/20 0400 04/06/20 0434 04/07/20 0348 04/08/20 0619 04/09/20 0432  WBC 12.1*   < > 7.6 17.1* 16.8* 13.6* 10.8*  NEUTROABS 9.2*  --   --   --   --   --   --   HGB 12.5   < > 11.3* 12.3 11.0* 9.6* 9.5*  HCT 35.8*   < > 32.4* 35.6* 31.5* 28.1* 27.6*  MCV 87.5   < > 89.3 89.0 88.5 89.5 89.6  PLT 342   < > 297 322 273 254 252   < > = values in this interval not displayed.   Basic Metabolic Panel: Recent Labs  Lab 04/05/20 0400 04/06/20 0434 04/07/20 0348 04/08/20 0619 04/09/20 0432  NA 131* 127* 132* 133* 135  K 3.9 3.5 3.4* 3.4* 3.6  CL 97* 92* 97* 99 101  CO2 26 27 24 25 24   GLUCOSE 88 108* 114* 100* 99  BUN <5* <5* 6 <5* <5*  CREATININE 0.48 0.60 0.71 0.56 0.65  CALCIUM 9.1 9.1 8.6* 8.6* 8.8*  MG 2.1  --  1.8  --   --    GFR: Estimated Creatinine Clearance: 73.4 mL/min (by C-G formula based on SCr of 0.65 mg/dL). Liver Function Tests: Recent Labs  Lab 04/03/20 1540  AST 25  ALT 13  ALKPHOS 68  BILITOT 0.4  PROT 8.6*  ALBUMIN 4.2   No results for input(s): LIPASE, AMYLASE in the last 168 hours. No results for input(s): AMMONIA in the last 168 hours. Coagulation Profile: No results for input(s): INR, PROTIME in the last 168 hours. Cardiac  Enzymes: No results for input(s): CKTOTAL, CKMB, CKMBINDEX, TROPONINI in the last 168 hours. BNP (last 3 results) No results for input(s): PROBNP in the last 8760 hours. HbA1C: No results for input(s): HGBA1C in the last 72 hours. CBG: No results for input(s): GLUCAP in the last 168 hours. Lipid Profile: No results for input(s): CHOL, HDL, LDLCALC, TRIG, CHOLHDL, LDLDIRECT in the last 72 hours. Thyroid Function Tests: No results for input(s): TSH, T4TOTAL, FREET4, T3FREE, THYROIDAB in the last 72 hours. Anemia Panel: No results for input(s): VITAMINB12, FOLATE, FERRITIN, TIBC, IRON, RETICCTPCT in the last 72 hours. Sepsis Labs: Recent Labs  Lab 04/06/20 0434 04/06/20 0950 04/07/20 0348 04/08/20 0619  PROCALCITON <0.10  --  <0.10 0.24  LATICACIDVEN  --  1.8  --   --     Recent Results (from the past 240 hour(s))  Respiratory Panel by RT PCR (Flu A&B, Covid) - Nasopharyngeal Swab     Status: None   Collection Time: 04/03/20  3:43 PM   Specimen: Nasopharyngeal  Swab  Result Value Ref Range Status   SARS Coronavirus 2 by RT PCR NEGATIVE NEGATIVE Final    Comment: (NOTE) SARS-CoV-2 target nucleic acids are NOT DETECTED.  The SARS-CoV-2 RNA is generally detectable in upper respiratoy specimens during the acute phase of infection. The lowest concentration of SARS-CoV-2 viral copies this assay can detect is 131 copies/mL. A negative result does not preclude SARS-Cov-2 infection and should not be used as the sole basis for treatment or other patient management decisions. A negative result may occur with  improper specimen collection/handling, submission of specimen other than nasopharyngeal swab, presence of viral mutation(s) within the areas targeted by this assay, and inadequate number of viral copies (<131 copies/mL). A negative result must be combined with clinical observations, patient history, and epidemiological information. The expected result is Negative.  Fact Sheet for  Patients:  PinkCheek.be  Fact Sheet for Healthcare Providers:  GravelBags.it  This test is no t yet approved or cleared by the [place] FDA and  has been authorized for detection and/or diagnosis of SARS-CoV-2 by FDA under an Emergency Use Authorization (EUA). This EUA will remain  in effect (meaning this test can be used) for the duration of the COVID-19 declaration under Section 564(b)(1) of the Act, 21 U.S.C. section 360bbb-3(b)(1), unless the authorization is terminated or revoked sooner.     Influenza A by PCR NEGATIVE NEGATIVE Final   Influenza B by PCR NEGATIVE NEGATIVE Final    Comment: (NOTE) The Xpert Xpress SARS-CoV-2/FLU/RSV assay is intended as an aid in  the diagnosis of influenza from Nasopharyngeal swab specimens and  should not be used as a sole basis for treatment. Nasal washings and  aspirates are unacceptable for Xpert Xpress SARS-CoV-2/FLU/RSV  testing.  Fact Sheet for Patients: PinkCheek.be  Fact Sheet for Healthcare Providers: GravelBags.it  This test is not yet approved or cleared by the [place] FDA and  has been authorized for detection and/or diagnosis of SARS-CoV-2 by  FDA under an Emergency Use Authorization (EUA). This EUA will remain  in effect (meaning this test can be used) for the duration of the  Covid-19 declaration under Section 564(b)(1) of the Act, 21  U.S.C. section 360bbb-3(b)(1), unless the authorization is  terminated or revoked. Performed at [facility], [address] [place], [place] [address]   CULTURE, BLOOD (ROUTINE X 2) w Reflex to ID Panel     Status: None (Preliminary result)   Collection Time: 04/06/20  9:51 AM   Specimen: BLOOD  Result Value Ref Range Status   Specimen Description BLOOD BLOOD RIGHT HAND  Final   Special Requests   Final    BOTTLES DRAWN AEROBIC AND ANAEROBIC Blood  Culture results may not be optimal due to an inadequate volume of blood received in culture bottles   Culture   Final    NO GROWTH 3 DAYS Performed at [facility], [address] [place], [place] [address]    Report Status PENDING  Incomplete  CULTURE, BLOOD (ROUTINE X 2) w Reflex to ID Panel     Status: None (Preliminary result)   Collection Time: 04/06/20 10:48 AM   Specimen: BLOOD  Result Value Ref Range Status   Specimen Description BLOOD BLOOD RIGHT HAND  Final   Special Requests   Final    BOTTLES DRAWN AEROBIC AND ANAEROBIC Blood Culture adequate volume   Culture   Final    NO GROWTH 3 DAYS Performed at [facility], [address] [place],  [place] [address]    Report Status PENDING  Incomplete  Urine Culture     Status: Abnormal   Collection Time: 04/07/20  9:30 AM   Specimen: Urine, Random  Result Value Ref Range Status   Specimen Description   Final    URINE, RANDOM Performed at [facility], [address] [place], [place] [address]    Special Requests   Final    NONE Performed at [facility], [address] [place], [place] [address]    Culture MULTIPLE SPECIES PRESENT, SUGGEST RECOLLECTION (A)  Final   Report Status 04/08/2020 FINAL  Final  Urine Culture     Status: None   Collection Time: 04/08/20 12:56 PM   Specimen: Urine, Random  Result Value Ref Range Status   Specimen Description   Final    URINE, RANDOM Performed at [facility], [address] [place], [place] [address]    Special Requests   Final    NONE Performed at [facility], [address], [place], [place] [address]    Culture   Final    NO GROWTH Performed at [facility], [place] [address], [place], [place] [address]    Report Status 04/09/2020 FINAL  Final      Radiology Studies: No results found.  Scheduled Meds: . benztropine  0.5 mg Oral BID  . divalproex  500 mg Oral Q12H  . enoxaparin (LOVENOX) injection   40 mg Subcutaneous Q24H  . haloperidol  5 mg Oral TID  . hydrOXYzine  25 mg Oral TID  . levothyroxine  88 mcg Oral Q0600  . loratadine  10 mg Oral Daily   Continuous Infusions: . sodium chloride 125 mL/hr at 04/09/20 1400  . piperacillin-tazobactam (ZOSYN)  IV Stopped (04/09/20 1339)     LOS: 5 days   Time spent: 35 minutes   Tamara Conde Loann Quill, MD [facility]  If 7PM-7AM, please contact night-coverage [email or web address] 04/09/2020, 3:31 PM

## 2020-04-10 ENCOUNTER — Telehealth: Payer: Self-pay | Admitting: Internal Medicine

## 2020-04-10 DIAGNOSIS — F70 Mild intellectual disabilities: Secondary | ICD-10-CM | POA: Diagnosis not present

## 2020-04-10 LAB — CBC
HCT: 28.1 % — ABNORMAL LOW (ref 36.0–46.0)
Hemoglobin: 9.6 g/dL — ABNORMAL LOW (ref 12.0–15.0)
MCH: 30.3 pg (ref 26.0–34.0)
MCHC: 34.2 g/dL (ref 30.0–36.0)
MCV: 88.6 fL (ref 80.0–100.0)
Platelets: 304 10*3/uL (ref 150–400)
RBC: 3.17 MIL/uL — ABNORMAL LOW (ref 3.87–5.11)
RDW: 12.5 % (ref 11.5–15.5)
WBC: 9.4 10*3/uL (ref 4.0–10.5)
nRBC: 0 % (ref 0.0–0.2)

## 2020-04-10 LAB — BASIC METABOLIC PANEL
Anion gap: 14 (ref 5–15)
BUN: <5 mg/dL — ABNORMAL LOW (ref 6–20)
CO2: 24 mmol/L (ref 22–32)
Calcium: 9.2 mg/dL (ref 8.9–10.3)
Chloride: 99 mmol/L (ref 98–111)
Creatinine, Ser: 0.54 mg/dL (ref 0.44–1.00)
GFR, Estimated: >60 mL/min (ref >60)
Glucose, Bld: 85 mg/dL (ref 70–99)
Potassium: 3.6 mmol/L (ref 3.5–5.1)
Sodium: 137 mmol/L (ref 135–145)

## 2020-04-10 MED ORDER — ACETAMINOPHEN 650 MG RE SUPP: 650.0000 mg | Freq: Four times a day (QID) | RECTAL | 0 refills | Status: DC | PRN

## 2020-04-10 NOTE — Progress Notes (Signed)
Mobility Specialist - Progress Note   04/10/20 1100  Mobility  Activity Ambulated in hall  Level of Assistance Standby assist, set-up cues, supervision of patient - no hands on  Assistive Device None  Distance Ambulated (ft) 180 ft  Mobility Response Tolerated well  Mobility performed by Mobility specialist  $Mobility charge 1 Mobility    Pre-mobility: 97 HR, 97% SpO2 Post-mobility: 111 HR, 99% SpO2   Pt was lying in bed upon arrival utilizing room air. Pt agreed to session. Pt denied any pain, nausea, or fatigue. Pt is independent in bed mobility. CGA donned. Pt ambulated 180' in room/hallway with supervision and no AD. Noted LOBx1 d/t excessive sneezing. Pt denied SOB, dizziness, and fatigue during activity. Overall, pt tolerated session well. Pt was left in bed with all needs in reach and alarm set.    Kathee Delton Mobility Specialist 04/10/20, 11:23 AM

## 2020-04-10 NOTE — Progress Notes (Signed)
PUNEET SELDEN   DOB:Jan 14, 1971   JY#:[id number]    Subjective: No acute events overnight.  Patient denies any pain.  However has no insight of her condition or of the treatment plan.  Objective:  Vitals:   04/10/20 0921 04/10/20 0922  BP:  (!) 105/59  Pulse:  88  Resp:  18  Temp: 98.2 F (36.8 C) 98.2 F (36.8 C)  SpO2:  98%     Intake/Output Summary (Last 24 hours) at 04/10/2020 2051 Last data filed at 04/10/2020 1000 Gross per 24 hour  Intake 2280.07 ml  Output 2900 ml  Net -619.93 ml    Physical Exam HENT:     Head: Normocephalic and atraumatic.     Mouth/Throat:     Pharynx: No oropharyngeal exudate.  Eyes:     Pupils: Pupils are equal, round, and reactive to light.  Cardiovascular:     Rate and Rhythm: Normal rate and regular rhythm.  Pulmonary:     Effort: Pulmonary effort is normal. No respiratory distress.     Breath sounds: Normal breath sounds. No wheezing.  Abdominal:     General: Bowel sounds are normal. There is no distension.     Palpations: Abdomen is soft. There is no mass.     Tenderness: There is no abdominal tenderness. There is no guarding or rebound.  Musculoskeletal:        General: No tenderness. Normal range of motion.     Cervical back: Normal range of motion and neck supple.  Skin:    General: Skin is warm.  Neurological:     Mental Status: She is alert and oriented to person, place, and time.  Psychiatric:        Mood and Affect: Affect normal.     Comments: Poor insight.      Labs:  Lab Results  Component Value Date   WBC 9.4 04/10/2020   HGB 9.6 (L) 04/10/2020   HCT 28.1 (L) 04/10/2020   MCV 88.6 04/10/2020   PLT 304 04/10/2020   NEUTROABS 9.2 (H) 04/03/2020    Lab Results  Component Value Date   NA 137 04/10/2020   K 3.6 04/10/2020   CL 99 04/10/2020   CO2 24 04/10/2020    Studies:  No results found.  Large mass of left breast # 49 year old female patient with schizophrenia-currently noted to have a large left  breast mass-concerning for malignancy.  # Left breast mass-based on clinical exam imaging- Biopsy positive for high grade malignancy- awaiting ER/PR/her-2 neu status.   # Schizophrenia-at baseline STABLE.   The above plan of care was discussed with the patient's sister; and Also Dr.Pahwani.  Patient will follow up with me in the clinic in the next week to discuss treatment options.  Also reviewed the possibility of upfront palliative mastectomy with Dr. Peyton Najjar.  However no definite recommendations at this time.  Cammie Sickle, MD 04/10/2020  8:51 PM

## 2020-04-10 NOTE — Telephone Encounter (Signed)
Spoke to pt's Valma Cava; made aware of the cancer diagnosis.  Awaiting for further testing before the treatment plan could be finalized.  Discussed with Dr. Peyton Najjar.  Discussed with Dr.Pahwani.  GB

## 2020-04-10 NOTE — TOC Transition Note (Signed)
Transition of Care [facility]) - CM/SW Discharge Note   Patient Details  Name: Tamara [name] MRN: [id number] Date of Birth: 03/26/1971  [facility] of [facility] CM/SW Contact:  Beverly Sessions, RN Phone Number: 04/10/2020, 9:47 AM   Clinical Narrative:    Patient to discharge home today IVC has been discontinued  PT has seen patient and does not recommend any follow up or DME.  Patient does not have any skilled need for home health at discharge. Updated sister [name]. [name] will be transporting at discharge.  Bedside RN notified    Final next level of care: Home/Self Care Barriers to Discharge: No Barriers Identified   Patient Goals and CMS Choice        Discharge Placement                       Discharge Plan and Services                                     Social Determinants of Health (SDOH) Interventions     Readmission Risk Interventions No flowsheet data found.

## 2020-04-10 NOTE — Progress Notes (Signed)
Tamara [name] states she was unsuccessful getting her mom however had spoken with her earlier and she was aware of patient's discharge and was expecting her home.  This RN reviewed discharge instructions, follow-up needs, and medi cation changes with patient and [name] on arrival to pick up patient, She verbalized understanding.  Patient escorted via w/c to main entrance.

## 2020-04-10 NOTE — Plan of Care (Signed)
Patient to be discharged to home. Contacted (Mom) Latiana Tomei but was unable to get through.will continue to attempt calling.

## 2020-04-10 NOTE — Discharge Summary (Addendum)
Physician Discharge Summary  Tamara [name] BJY:[id number] DOB: 16-Jul-1970 DOA: 04/03/2020  PCP: [facility] [place] date: 04/03/2020 Discharge date: 04/10/2020  Admitted From: Home Disposition:  home with family  Recommendations for Outpatient Follow-up:  Follow-up with PCP in 1 week Repeat CBC and BMP on follow-up visit Follow-up with oncology and discuss about pending results and treatment plan.  Home Health: None Equipment/Devices: None Discharge Condition: Stable CODE STATUS: Full code Diet recommendation: Regular diet  Brief/Interim Summary: Tamara B Robinsonis a 49 y.o.femalewith medical history significant forhypothyroid congenital, mild intellectual disability, schizophrenia, presented to the emergency department acutely and agitated with screaming. Patient was emergently IVC due to concern for acute psychosis.  While EKG leads were being place, left breast mass was discovered. Ultrasound showed mass concerning for malignancy.  Patient admitted for further inpatient work-up of left breast mass.  Left breast mass: Concerning for malignancy -S/p ultrasound-guided core needle biopsy of left breast mass and axillary lymph node by IR on 11/12.  -ultrasound of left breast shows large solid mass at 1 o'clock position concerning for malignancy.  -Reviewed CT chest/abdomen/pelvis shows: Large left breast mass is identified compatible with primary breast carcinoma.  This extends posteriorly to the pectoralis major and extending anteriorly to the skin surface.  Cannot exclude chest wall involvement.  Enlarged left axillary and left retropectoral and left internal mammary lymph nodes compatible with metastatic adenopathy. -Appreciated general surgery recommendations-no surgical intervention at this time.  -Appreciated oncology recommendations: Recommended to DC patient home today and follow-up outpatient to discuss pending/final biopsy results and treatment plan.   SIRS:  Resolved -Patient became febrile, tachycardic, hypotensive and had leukocytosis. -Chest x-ray negative for pneumonia, lactic acid, PCT: WNL, blood culture: Negative. UA: Positive for leukocytes, bacteria and WBC.  Urine culture: Shows multiple species.  Repeat urine culture: Negative.  Patient denied any urinary symptoms. -Patient started on IV fluids and broad-spectrum antibiotics Zosyn.  Her symptoms improved and she remained afebrile, vital signs remained stable, work-up including chest x-ray, UA, UC, BC: Negative therefore IV antibiotics discontinued.  Leukocytosis resolved.  She received total 4 days of IV Zosyn.  Schizophrenia -Continued home meds-Depakote, Haldol, hydroxyzine. -Appreciate psych input -[name] behavior been very appropriate for many days.  IVC discontinued-Per psych. -Discussed with Dr. Weber Cooks via secure chat-he is okay to discharge patient from his standpoint.  Normocytic anemia: -H&H dropped from 11-9.6-could be hemodilution all. -No signs of active bleeding noted. -Repeat CBC on follow-up visit recommended.  Hyponatremia: Chronic-resolved -Urine sodium: WNL.    Congenital hypothyroidism-continued levothyroxine  Hypokalemia:  Replenished.  Magnesium level: WNL  Mild intellectual disability-appeared at baseline -Evaluated by PT-recommended no PT follow-up.  Discussed plan of care with patient's sister-Tamara [name] over the phone and she verbalized understanding.  Patient will be discharged to home with her family.  Discharge Diagnoses:  Left breast mass concerning for malignancy Source Schizophrenia Normocytic anemia Hyponatremia Congenital hypothyroidism Hypokalemia Mild intellectual disability Normocytic anemia  Discharge Instructions  Discharge Instructions    Diet general   Complete by: As directed    Discharge instructions   Complete by: As directed    Follow-up with PCP in 1 week Repeat CBC and BMP on follow-up visit Follow-up with  oncology and discuss about pending results and treatment plan.   Increase activity slowly   Complete by: As directed      Allergies as of 04/10/2020   No Known Allergies     Medication List    TAKE these medications   acetaminophen  650 MG suppository Commonly known as: TYLENOL Place 1 suppository (650 mg total) rectally every 6 (six) hours as needed for fever.   Banophen 25 mg capsule Generic drug: diphenhydrAMINE Take 25 mg by mouth every 6 (six) hours as needed for itching.   benztropine 0.5 MG tablet Commonly known as: COGENTIN Take 1 tablet (0.5 mg total) by mouth 2 (two) times daily.   cetirizine 10 MG tablet Commonly known as: ZYRTEC Take 1 tablet (10 mg total) by mouth daily as needed for allergies. What changed: when to take this   divalproex 500 MG DR tablet Commonly known as: DEPAKOTE Take 1 tablet (500 mg total) by mouth every 12 (twelve) hours.   haloperidol 5 MG tablet Commonly known as: HALDOL Take 1 tablet (5 mg total) by mouth 3 (three) times daily.   haloperidol decanoate 100 MG/ML injection Commonly known as: HALDOL DECANOATE Inject 100 mg into the muscle every 28 (twenty-eight) days.   hydrOXYzine 25 MG tablet Commonly known as: ATARAX/VISTARIL Take 25 mg by mouth 3 (three) times daily.   levothyroxine 88 MCG tablet Commonly known as: SYNTHROID Take 1 tablet (88 mcg total) by mouth daily at 6 (six) AM.       No Known Allergies  Consultations:  General surgery  Oncology  Psych   Procedures/Studies: DG Chest 1 View  Result Date: 04/06/2020 CLINICAL DATA:  Possible pneumonia EXAM: CHEST  1 VIEW COMPARISON:  04/04/2020 FINDINGS: Cardiac shadow is within normal limits. The lungs are well aerated bilaterally. No focal infiltrate or effusion is seen. No bony abnormality is noted. IMPRESSION: No acute abnormality seen. Electronically Signed   By: Inez Catalina M.D.   On: 04/06/2020 16:20   CT CHEST ABDOMEN PELVIS W CONTRAST  Result  Date: 04/04/2020 CLINICAL DATA:  Left breast mass identified recently. Evaluate for metastatic disease. EXAM: CT CHEST, ABDOMEN, AND PELVIS WITH CONTRAST TECHNIQUE: Multidetector CT imaging of the chest, abdomen and pelvis was performed following the standard protocol during bolus administration of intravenous contrast. CONTRAST:  142m OMNIPAQUE IOHEXOL 300 MG/ML  SOLN COMPARISON:  None. FINDINGS: CT CHEST FINDINGS Cardiovascular: The heart size is within normal limits. No pericardial effusion identified. Mediastinum/Nodes: Normal appearance of the thyroid gland. The trachea appears patent and is midline. Normal appearance of the esophagus. Multiple enlarged left axillary lymph nodes are identified. Index node measures 2.1 cm, image 18/4. Adjacent left axillary lymph node measures 2.8 cm, image 22/4. Enlarged left retropectoral lymph node measures 1.5 cm, image 13/4. Prominent left internal mammary lymph nodes are identified. The largest measures 9 mm short axis, image 21/4. Left supraclavicular lymph node measures 1 cm, image 9/4. No enlarged right axillary or supraclavicular lymph nodes. No enlarged mediastinal or hilar lymph nodes. Lungs/Pleura: No pleural effusion. Patchy nonspecific areas of ground-glass attenuation and subsegmental atelectasis noted within the lung bases. No suspicious solid lung nodules identified to suggest pulmonary metastases. Musculoskeletal: Large left breast mass is noted measuring 8.4 x 6.8 cm, image 24/4. This extends posteriorly to the pectoralis major muscle and extends anteriorly to the skin surface. There is overlying skin thickening noted. Cannot exclude chest wall involvement. CT ABDOMEN PELVIS FINDINGS Hepatobiliary: No focal liver abnormality is seen. No gallstones, gallbladder wall thickening, or biliary dilatation. Pancreas: Unremarkable. No pancreatic ductal dilatation or surrounding inflammatory changes. Spleen: Normal in size without focal abnormality. Adrenals/Urinary  Tract: The adrenal glands appear normal. No kidney mass identified bilaterally. Moderate distension of the urinary bladder. No focal bladder abnormality. Stomach/Bowel: Stomach is within normal  limits. Appendix appears normal. No evidence of bowel wall thickening, distention, or inflammatory changes. Vascular/Lymphatic: Normal appearance of the abdominal aorta. No abdominopelvic adenopathy. Reproductive: Multiple enhancing pelvic masses are identified which appear closely associated with the uterus. Some of these likely represent uterine fibroids. Within the left pelvis there is a mixed attenuation mass which measures 3.9 x 3.2 by 2.8 cm. This contains a focal area of fat attenuation measuring 1.2 cm, image 78/8. Other: There is no free fluid identified within the abdomen or pelvis. No focal fluid collections identified. Increased soft tissue stranding S is identified within the left upper quadrant peritoneal fat, image 63/4. No discrete peritoneal nodule or mass noted however. Musculoskeletal: No acute or significant osseous findings. IMPRESSION: 1. Large left breast mass is identified compatible with primary breast carcinoma. This extends posteriorly to the pectoralis major muscle and extends anteriorly to the skin surface. Cannot exclude chest wall involvement. 2. Enlarged left axillary, left retropectoral, and left internal mammary lymph nodes compatible with metastatic adenopathy. 3. Soft tissue stranding within the left upper quadrant of the abdomen is identified. There is also mild soft tissue stranding is in the omentum. No discrete measurable nodule identified. Although not diagnostic for peritoneal disease, given the extent of tumor involvement in the left breast and left axilla early peritoneal disease cannot be excluded. 4. Multiple enhancing pelvic masses are identified which appear closely associated with the uterus. Some of these likely represent uterine fibroids. Within the left pelvis there is a  mixed attenuation mass which contains a focal area of fat attenuation. 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 Advise further evaluation with pelvic sonogram for more definitive characterization. Electronically Signed   By: Kerby Moors M.D.   On: 04/04/2020 19:28   US BREAST LTD UNI LEFT INC AXILLA  Result Date: 04/03/2020 CLINICAL DATA:  Patient presents with redness of the left breast and a large left breast mass. The patient was evaluated through the emergency department for evaluation of possible abscess. EXAM: ULTRASOUND OF THE LEFT BREAST COMPARISON:  None. FINDINGS: On physical exam, there is a large mass within the left breast. Targeted ultrasound is performed, showing a 6.3 x 6.2 x 7.1 cm lobular hypoechoic solid mass left breast 1 o'clock position (upper-outer quadrant). Multiple abnormal cortically thickened left axillary lymph nodes are demonstrated measuring up to 2.6 cm with effacement of the fatty hilum. IMPRESSION: 1. Large solid mass left breast 1 o'clock position concerning for malignancy. 2. Multiple abnormal cortically thickened left axillary lymph nodes concerning for metastatic adenopathy. RECOMMENDATION: 1. Recommend evaluation of the right and left breast with dedicated mammography and ultrasound at a dedicated breast center. This will allow for comprehensive evaluation given the concern of left breast malignancy. 2. Ultimately patient will need at least a biopsy of the large palpable left breast mass and 1 of the left axillary lymph nodes. BI-RADS CATEGORY  5: Highly suggestive of malignancy. These results were called by telephone at the time of interpretation on 04/03/2020 at 4:33 pm to provider Dr. Charna Archer, who verbally acknowledged these results. Electronically Signed   By: Lovey Newcomer M.D.   On: 04/03/2020 16:51   [place] LT BREAST BX W LOC DEV 1ST LESION IMG BX SPEC US GUIDE  Result Date: 04/06/2020 CLINICAL DATA:  Left breast 1 o'clock large mass. EXAM: ULTRASOUND  GUIDED LEFT BREAST CORE NEEDLE BIOPSY COMPARISON:  Previous exam(s). PROCEDURE: I met with the patient and we discussed the procedure of ultrasound-guided biopsy, including benefits and alternatives.  We discussed the high likelihood of a successful procedure. We discussed the risks of the procedure, including infection, bleeding, tissue injury, clip migration, and inadequate sampling. Informed written consent was given. The usual time-out protocol was performed immediately prior to the procedure. Lesion quadrant: All quadrants of the breast. Using sterile technique and 1% Lidocaine as local anesthetic, under direct ultrasound visualization, a 14 gauge spring-loaded device was used to perform biopsy of left breast 1 o'clock mass using a lateral approach. At the conclusion of the procedure vision tissue marker clip was deployed into the biopsy cavity. Next, using sterile technique and 1% Lidocaine as local anesthetic, under direct ultrasound visualization, a 14 gauge spring-loaded device was used to perform biopsy of left axillary lymph node using a lateral approach. At the conclusion of the procedure shaped 3 HydroMARK tissue marker clip was deployed into the biopsy cavity Follow up 2 view mammogram was not performed as the patient was sedated and unsafe to stand up. IMPRESSION: Ultrasound guided biopsy of large left breast mass and an abnormal left axillary lymph node. No apparent complications. Electronically Signed   By: Fidela Salisbury M.D.   On: 04/06/2020 10:17   [place] LT BREAST BX W LOC DEV EA ADD LESION IMG BX SPEC US GUIDE  Result Date: 04/06/2020 CLINICAL DATA:  Left breast 1 o'clock large mass. EXAM: ULTRASOUND GUIDED LEFT BREAST CORE NEEDLE BIOPSY COMPARISON:  Previous exam(s). PROCEDURE: I met with the patient and we discussed the procedure of ultrasound-guided biopsy, including benefits and alternatives. We discussed the high likelihood of a successful procedure. We discussed the risks of the  procedure, including infection, bleeding, tissue injury, clip migration, and inadequate sampling. Informed written consent was given. The usual time-out protocol was performed immediately prior to the procedure. Lesion quadrant: All quadrants of the breast. Using sterile technique and 1% Lidocaine as local anesthetic, under direct ultrasound visualization, a 14 gauge spring-loaded device was used to perform biopsy of left breast 1 o'clock mass using a lateral approach. At the conclusion of the procedure vision tissue marker clip was deployed into the biopsy cavity. Next, using sterile technique and 1% Lidocaine as local anesthetic, under direct ultrasound visualization, a 14 gauge spring-loaded device was used to perform biopsy of left axillary lymph node using a lateral approach. At the conclusion of the procedure shaped 3 HydroMARK tissue marker clip was deployed into the biopsy cavity Follow up 2 view mammogram was not performed as the patient was sedated and unsafe to stand up. IMPRESSION: Ultrasound guided biopsy of large left breast mass and an abnormal left axillary lymph node. No apparent complications. Electronically Signed   By: Fidela Salisbury M.D.   On: 04/06/2020 10:17      Subjective: Patient seen and examined.  Sleeping comfortably on the bed.  Denies any new complaints.  Smiling and happy to hear that she is going home today with her family.  Discharge Exam: Vitals:   04/10/20 0921 04/10/20 0922  BP:  (!) 105/59  Pulse:  88  Resp:  18  Temp: 98.2 F (36.8 C)   SpO2:  98%   Vitals:   04/10/20 0100 04/10/20 0408 04/10/20 0921 04/10/20 0922  BP: 118/63 114/66  (!) 105/59  Pulse: 86 94  88  Resp: _0 Temp: 98.9 F (37.2 C) 98.4 F (36.9 C) 98.2 F (36.8 C)   TempSrc: Oral Oral    SpO2: 99% 100%  98%  Weight:      Height:  General: Pt is alert, awake, not in acute distress on room air, sleepy but arousable and communicating well Chest: Left breast mass  noted, Band-Aid noted, no signs of active bleeding, open wound or discharge seen, nontender, firm mass, mildly erythematous, nipple: Everted. Cardiovascular: RRR, S1/S2 +, no rubs, no gallops Respiratory: CTA bilaterally, no wheezing, no rhonchi Abdominal: Soft, NT, ND, bowel sounds + Extremities: no edema, no cyanosis    The results of significant diagnostics from this hospitalization (including imaging, microbiology, ancillary and laboratory) are listed below for reference.     Microbiology: Recent Results (from the past 240 hour(s))  Respiratory Panel by RT PCR (Flu A&B, Covid) - Nasopharyngeal Swab     Status: None   Collection Time: 04/03/20  3:43 PM   Specimen: Nasopharyngeal Swab  Result Value Ref Range Status   SARS Coronavirus 2 by RT PCR NEGATIVE NEGATIVE Final    Comment: (NOTE) SARS-CoV-2 target nucleic acids are NOT DETECTED.  The SARS-CoV-2 RNA is generally detectable in upper respiratoy specimens during the acute phase of infection. The lowest concentration of SARS-CoV-2 viral copies this assay can detect is 131 copies/mL. A negative result does not preclude SARS-Cov-2 infection and should not be used as the sole basis for treatment or other patient management decisions. A negative result may occur with  improper specimen collection/handling, submission of specimen other than nasopharyngeal swab, presence of viral mutation(s) within the areas targeted by this assay, and inadequate number of viral copies (<131 copies/mL). A negative result must be combined with clinical observations, patient history, and epidemiological information. The expected result is Negative.  Fact Sheet for Patients:  PinkCheek.be  Fact Sheet for Healthcare Providers:  GravelBags.it  This test is no t yet approved or cleared by the [place] FDA and  has been authorized for detection and/or diagnosis of SARS-CoV-2 by FDA under  an Emergency Use Authorization (EUA). This EUA will remain  in effect (meaning this test can be used) for the duration of the COVID-19 declaration under Section 564(b)(1) of the Act, 21 U.S.C. section 360bbb-3(b)(1), unless the authorization is terminated or revoked sooner.     Influenza A by PCR NEGATIVE NEGATIVE Final   Influenza B by PCR NEGATIVE NEGATIVE Final    Comment: (NOTE) The Xpert Xpress SARS-CoV-2/FLU/RSV assay is intended as an aid in  the diagnosis of influenza from Nasopharyngeal swab specimens and  should not be used as a sole basis for treatment. Nasal washings and  aspirates are unacceptable for Xpert Xpress SARS-CoV-2/FLU/RSV  testing.  Fact Sheet for Patients: PinkCheek.be  Fact Sheet for Healthcare Providers: GravelBags.it  This test is not yet approved or cleared by the [place] FDA and  has been authorized for detection and/or diagnosis of SARS-CoV-2 by  FDA under an Emergency Use Authorization (EUA). This EUA will remain  in effect (meaning this test can be used) for the duration of the  Covid-19 declaration under Section 564(b)(1) of the Act, 21  U.S.C. section 360bbb-3(b)(1), unless the authorization is  terminated or revoked. Performed at [facility], [address] [place], [place] [address]   CULTURE, BLOOD (ROUTINE X 2) w Reflex to ID Panel     Status: None (Preliminary result)   Collection Time: 04/06/20  9:51 AM   Specimen: BLOOD  Result Value Ref Range Status   Specimen Description BLOOD BLOOD RIGHT HAND  Final   Special Requests   Final    BOTTLES DRAWN AEROBIC AND ANAEROBIC Blood Culture results may not be  optimal due to an inadequate volume of blood received in culture bottles   Culture   Final    NO GROWTH 4 DAYS Performed at [facility], [address] [place], [place] [address]    Report Status PENDING  Incomplete  CULTURE, BLOOD (ROUTINE X 2) w  Reflex to ID Panel     Status: None (Preliminary result)   Collection Time: 04/06/20 10:48 AM   Specimen: BLOOD  Result Value Ref Range Status   Specimen Description BLOOD BLOOD RIGHT HAND  Final   Special Requests   Final    BOTTLES DRAWN AEROBIC AND ANAEROBIC Blood Culture adequate volume   Culture   Final    NO GROWTH 4 DAYS Performed at [facility], [address], [place], [place] [address]    Report Status PENDING  Incomplete  Urine Culture     Status: Abnormal   Collection Time: 04/07/20  9:30 AM   Specimen: Urine, Random  Result Value Ref Range Status   Specimen Description   Final    URINE, RANDOM Performed at [facility], [address], [place], [place] [address]    Special Requests   Final    NONE Performed at [facility], [address], [place], [place] [address]    Culture MULTIPLE SPECIES PRESENT, SUGGEST RECOLLECTION (A)  Final   Report Status 04/08/2020 FINAL  Final  Urine Culture     Status: None   Collection Time: 04/08/20 12:56 PM   Specimen: Urine, Random  Result Value Ref Range Status   Specimen Description   Final    URINE, RANDOM Performed at [facility], [address] [place], [place] [address]    Special Requests   Final    NONE Performed at [facility], [address] [place], [place] [address]    Culture   Final    NO GROWTH Performed at [facility], [place] [address] [place], [place] [address]    Report Status 04/09/2020 FINAL  Final     Labs: BNP (last 3 results) No results for input(s): BNP in the last 8760 hours. Basic Metabolic Panel: Recent Labs  Lab 04/05/20 0400 04/05/20 0400 04/06/20 0434 04/07/20 0348 04/08/20 0619 04/09/20 0432 04/10/20 0642  NA 131*   < > 127* 132* 133* 135 137  K 3.9   < > 3.5 3.4* 3.4* 3.6 3.6  CL 97*   < > 92* 97* 99 101 99  CO2 26   < > _0 GLUCOSE 88   < > 108* 114* 100* 99 85  BUN <5*   < > <5* 6 <5* <5* <5*   CREATININE 0.48   < > 0.60 0.71 0.56 0.65 0.54  CALCIUM 9.1   < > 9.1 8.6* 8.6* 8.8* 9.2  MG 2.1  --   --  1.8  --   --   --    < > = values in this interval not displayed.   Liver Function Tests: Recent Labs  Lab 04/03/20 1540  AST 25  ALT 13  ALKPHOS 68  BILITOT 0.4  PROT 8.6*  ALBUMIN 4.2   No results for input(s): LIPASE, AMYLASE in the last 168 hours. No results for input(s): AMMONIA in the last 168 hours. CBC: Recent Labs  Lab 04/03/20 1540 04/05/20 0400 04/06/20 0434 04/07/20 0348 04/08/20 0619 04/09/20 0432 04/10/20 0642  WBC 12.1*   < > 17.1* 16.8* 13.6* 10.8* 9.4  NEUTROABS 9.2*  --   --   --   --   --   --   HGB 12.5   < > 12.3 11.0* 9.6* 9.5* 9.6*  HCT 35.8*   < > 35.6* 31.5* 28.1* 27.6* 28.1*  MCV 87.5   < > 89.0 88.5 89.5 89.6 88.6  PLT 342   < > 322 273 254 252 304   < > = values in this interval not displayed.   Cardiac Enzymes: No results for input(s): CKTOTAL, CKMB, CKMBINDEX, TROPONINI in the last 168 hours. BNP: Invalid input(s): POCBNP CBG: No results for input(s): GLUCAP in the last 168 hours. D-Dimer No results for input(s): DDIMER in the last 72 hours. Hgb A1c No results for input(s): HGBA1C in the last 72 hours. Lipid Profile No results for input(s): CHOL, HDL, LDLCALC, TRIG, CHOLHDL, LDLDIRECT in the last 72 hours. Thyroid function studies No results for input(s): TSH, T4TOTAL, T3FREE, THYROIDAB in the last 72 hours.  Invalid input(s): FREET3 Anemia work up No results for input(s): VITAMINB12, FOLATE, FERRITIN, TIBC, IRON, RETICCTPCT in the last 72 hours. Urinalysis    Component Value Date/Time   COLORURINE YELLOW (A) 04/06/2020 1530   APPEARANCEUR TURBID (A) 04/06/2020 1530   APPEARANCEUR Clear 11/11/2013 2142   LABSPEC 1.008 04/06/2020 1530   LABSPEC 1.001 11/11/2013 2142   PHURINE 5.0 04/06/2020 1530   GLUCOSEU NEGATIVE 04/06/2020 1530   GLUCOSEU Negative 11/11/2013 2142   HGBUR MODERATE (A) 04/06/2020 1530   BILIRUBINUR  NEGATIVE 04/06/2020 1530   BILIRUBINUR Negative 11/11/2013 2142   KETONESUR NEGATIVE 04/06/2020 1530   PROTEINUR 30 (A) 04/06/2020 1530   NITRITE NEGATIVE 04/06/2020 1530   LEUKOCYTESUR LARGE (A) 04/06/2020 1530   LEUKOCYTESUR 1+ 11/11/2013 2142   Sepsis Labs Invalid input(s): PROCALCITONIN,  WBC,  LACTICIDVEN Microbiology Recent Results (from the past 240 hour(s))  Respiratory Panel by RT PCR (Flu A&B, Covid) - Nasopharyngeal Swab     Status: None   Collection Time: 04/03/20  3:43 PM   Specimen: Nasopharyngeal Swab  Result Value Ref Range Status   SARS Coronavirus 2 by RT PCR NEGATIVE NEGATIVE Final    Comment: (NOTE) SARS-CoV-2 target nucleic acids are NOT DETECTED.  The SARS-CoV-2 RNA is generally detectable in upper respiratoy specimens during the acute phase of infection. The lowest concentration of SARS-CoV-2 viral copies this assay can detect is 131 copies/mL. A negative result does not preclude SARS-Cov-2 infection and should not be used as the sole basis for treatment or other patient management decisions. A negative result may occur with  improper specimen collection/handling, submission of specimen other than nasopharyngeal swab, presence of viral mutation(s) within the areas targeted by this assay, and inadequate number of viral copies (<131 copies/mL). A negative result must be combined with clinical observations, patient history, and epidemiological information. The expected result is Negative.  Fact Sheet for Patients:  PinkCheek.be  Fact Sheet for Healthcare Providers:  GravelBags.it  This test is no t yet approved or cleared by the [place] FDA and  has been authorized for detection and/or diagnosis of SARS-CoV-2 by FDA under an Emergency Use Authorization (EUA). This EUA will remain  in effect (meaning this test can be used) for the duration of the COVID-19 declaration under Section 564(b)(1) of  the Act, 21 U.S.C. section 360bbb-3(b)(1), unless the authorization is terminated or revoked sooner.     Influenza A by PCR NEGATIVE NEGATIVE Final   Influenza B by PCR NEGATIVE NEGATIVE Final    Comment: (NOTE)  The Xpert Xpress SARS-CoV-2/FLU/RSV assay is intended as an aid in  the diagnosis of influenza from Nasopharyngeal swab specimens and  should not be used as a sole basis for treatment. Nasal washings and  aspirates are unacceptable for Xpert Xpress SARS-CoV-2/FLU/RSV  testing.  Fact Sheet for Patients: PinkCheek.be  Fact Sheet for Healthcare Providers: GravelBags.it  This test is not yet approved or cleared by the [place] FDA and  has been authorized for detection and/or diagnosis of SARS-CoV-2 by  FDA under an Emergency Use Authorization (EUA). This EUA will remain  in effect (meaning this test can be used) for the duration of the  Covid-19 declaration under Section 564(b)(1) of the Act, 21  U.S.C. section 360bbb-3(b)(1), unless the authorization is  terminated or revoked. Performed at [facility], [address] [place], [place] [address]   CULTURE, BLOOD (ROUTINE X 2) w Reflex to ID Panel     Status: None (Preliminary result)   Collection Time: 04/06/20  9:51 AM   Specimen: BLOOD  Result Value Ref Range Status   Specimen Description BLOOD BLOOD RIGHT HAND  Final   Special Requests   Final    BOTTLES DRAWN AEROBIC AND ANAEROBIC Blood Culture results may not be optimal due to an inadequate volume of blood received in culture bottles   Culture   Final    NO GROWTH 4 DAYS Performed at [facility], [address] [place], [place] [address]    Report Status PENDING  Incomplete  CULTURE, BLOOD (ROUTINE X 2) w Reflex to ID Panel     Status: None (Preliminary result)   Collection Time: 04/06/20 10:48 AM   Specimen: BLOOD  Result Value Ref Range Status   Specimen Description BLOOD  BLOOD RIGHT HAND  Final   Special Requests   Final    BOTTLES DRAWN AEROBIC AND ANAEROBIC Blood Culture adequate volume   Culture   Final    NO GROWTH 4 DAYS Performed at [facility], [address], [place], [place] [address]    Report Status PENDING  Incomplete  Urine Culture     Status: Abnormal   Collection Time: 04/07/20  9:30 AM   Specimen: Urine, Random  Result Value Ref Range Status   Specimen Description   Final    URINE, RANDOM Performed at [facility], [address] [place], [place] [address]    Special Requests   Final    NONE Performed at [facility], [address] [place], [place] [address]    Culture MULTIPLE SPECIES PRESENT, SUGGEST RECOLLECTION (A)  Final   Report Status 04/08/2020 FINAL  Final  Urine Culture     Status: None   Collection Time: 04/08/20 12:56 PM   Specimen: Urine, Random  Result Value Ref Range Status   Specimen Description   Final    URINE, RANDOM Performed at [facility], [address], [place], [place] [address]    Special Requests   Final    NONE Performed at [facility], [address], [place], [place] [address]    Culture   Final    NO GROWTH Performed at [facility], [place] [address] [place], [place] [address]    Report Status 04/09/2020 FINAL  Final     Time coordinating discharge: Over 30 minutes  SIGNED:   Mckinley Jewel, MD  [facility] 04/10/2020, 9:40 AM Pager   If 7PM-7AM, please contact night-coverage [email or web address]

## 2020-04-10 NOTE — Progress Notes (Signed)
[name] (sister ) states "Mom is aware" and she will have her return call.  [name] states she will be coming to pick patient up after getting off work.  Will continue to attempt to contact patient's Mom.  PIV removed with tip intact.

## 2020-04-11 ENCOUNTER — Other Ambulatory Visit: Payer: Self-pay | Admitting: *Deleted

## 2020-04-11 LAB — CULTURE, BLOOD (ROUTINE X 2)
Culture: NO GROWTH
Culture: NO GROWTH
Special Requests: ADEQUATE

## 2020-04-13 ENCOUNTER — Other Ambulatory Visit: Payer: Self-pay | Admitting: Anatomic Pathology & Clinical Pathology

## 2020-04-13 LAB — SURGICAL PATHOLOGY

## 2020-04-16 ENCOUNTER — Telehealth: Payer: Self-pay | Admitting: Internal Medicine

## 2020-04-16 ENCOUNTER — Inpatient Hospital Stay: Payer: Medicaid Other | Attending: Internal Medicine

## 2020-04-16 DIAGNOSIS — Z79899 Other long term (current) drug therapy: Secondary | ICD-10-CM | POA: Insufficient documentation

## 2020-04-16 DIAGNOSIS — Z171 Estrogen receptor negative status [ER-]: Secondary | ICD-10-CM | POA: Insufficient documentation

## 2020-04-16 DIAGNOSIS — E079 Disorder of thyroid, unspecified: Secondary | ICD-10-CM | POA: Insufficient documentation

## 2020-04-16 DIAGNOSIS — K219 Gastro-esophageal reflux disease without esophagitis: Secondary | ICD-10-CM | POA: Insufficient documentation

## 2020-04-16 DIAGNOSIS — C50812 Malignant neoplasm of overlapping sites of left female breast: Secondary | ICD-10-CM | POA: Insufficient documentation

## 2020-04-16 DIAGNOSIS — F209 Schizophrenia, unspecified: Secondary | ICD-10-CM | POA: Insufficient documentation

## 2020-04-16 NOTE — Telephone Encounter (Signed)
On 11/22-patient's case reviewed the tumor conference.  Ideally would recommend neoadjuvant chemotherapy followed by surgery.  However chemotherapy can be quite challenging given her schizophrenia.  Melissa- Schedule follow-up on 11/30; at 10:45-MD; wll order labs after the visit. Call the sister, Audrey/ or mother for appts.   FYI-Dr.Cintron.  GB

## 2020-04-24 ENCOUNTER — Inpatient Hospital Stay (HOSPITAL_BASED_OUTPATIENT_CLINIC_OR_DEPARTMENT_OTHER): Payer: Medicaid Other | Admitting: Hospice and Palliative Medicine

## 2020-04-24 ENCOUNTER — Inpatient Hospital Stay (HOSPITAL_BASED_OUTPATIENT_CLINIC_OR_DEPARTMENT_OTHER): Payer: Medicaid Other | Admitting: Internal Medicine

## 2020-04-24 ENCOUNTER — Other Ambulatory Visit: Payer: Self-pay

## 2020-04-24 ENCOUNTER — Inpatient Hospital Stay: Payer: Medicaid Other

## 2020-04-24 ENCOUNTER — Telehealth: Payer: Self-pay | Admitting: *Deleted

## 2020-04-24 DIAGNOSIS — K219 Gastro-esophageal reflux disease without esophagitis: Secondary | ICD-10-CM | POA: Diagnosis not present

## 2020-04-24 DIAGNOSIS — C50812 Malignant neoplasm of overlapping sites of left female breast: Secondary | ICD-10-CM

## 2020-04-24 DIAGNOSIS — Z171 Estrogen receptor negative status [ER-]: Secondary | ICD-10-CM

## 2020-04-24 DIAGNOSIS — E079 Disorder of thyroid, unspecified: Secondary | ICD-10-CM | POA: Diagnosis not present

## 2020-04-24 DIAGNOSIS — Z515 Encounter for palliative care: Secondary | ICD-10-CM

## 2020-04-24 DIAGNOSIS — Z7189 Other specified counseling: Secondary | ICD-10-CM | POA: Diagnosis not present

## 2020-04-24 DIAGNOSIS — Z79899 Other long term (current) drug therapy: Secondary | ICD-10-CM | POA: Diagnosis not present

## 2020-04-24 DIAGNOSIS — F209 Schizophrenia, unspecified: Secondary | ICD-10-CM | POA: Diagnosis not present

## 2020-04-24 MED ORDER — ONDANSETRON HCL 8 MG PO TABS: ORAL_TABLET | ORAL | 1 refills | Status: DC

## 2020-04-24 MED ORDER — PROCHLORPERAZINE MALEATE 10 MG PO TABS: 10.0000 mg | ORAL_TABLET | Freq: Four times a day (QID) | ORAL | 1 refills | Status: DC | PRN

## 2020-04-24 NOTE — Patient Instructions (Signed)
Paclitaxel injection What is this medicine? PACLITAXEL (PAK li TAX el) is a chemotherapy drug. It targets fast dividing cells, like cancer cells, and causes these cells to die. This medicine is used to treat ovarian cancer, breast cancer, lung cancer, Kaposi's sarcoma, and other cancers. This medicine may be used for other purposes; ask your health care provider or pharmacist if you have questions. COMMON BRAND NAME(S): Onxol, Taxol What should I tell my health care provider before I take this medicine? They need to know if you have any of these conditions:  history of irregular heartbeat  liver disease  low blood counts, like low white cell, platelet, or red cell counts  lung or breathing disease, like asthma  tingling of the fingers or toes, or other nerve disorder  an unusual or allergic reaction to paclitaxel, alcohol, polyoxyethylated castor oil, other chemotherapy, other medicines, foods, dyes, or preservatives  pregnant or trying to get pregnant  breast-feeding How should I use this medicine? This drug is given as an infusion into a vein. It is administered in a hospital or clinic by a specially trained health care professional. Talk to your pediatrician regarding the use of this medicine in children. Special care may be needed. Overdosage: If you think you have taken too much of this medicine contact a poison control center or emergency room at once. NOTE: This medicine is only for you. Do not share this medicine with others. What if I miss a dose? It is important not to miss your dose. Call your doctor or health care professional if you are unable to keep an appointment. What may interact with this medicine? Do not take this medicine with any of the following medications:  disulfiram  metronidazole This medicine may also interact with the following medications:  antiviral medicines for hepatitis, HIV or AIDS  certain antibiotics like erythromycin and  clarithromycin  certain medicines for fungal infections like ketoconazole and itraconazole  certain medicines for seizures like carbamazepine, phenobarbital, phenytoin  gemfibrozil  nefazodone  rifampin  St. John's wort This list may not describe all possible interactions. Give your health care provider a list of all the medicines, herbs, non-prescription drugs, or dietary supplements you use. Also tell them if you smoke, drink alcohol, or use illegal drugs. Some items may interact with your medicine. What should I watch for while using this medicine? Your condition will be monitored carefully while you are receiving this medicine. You will need important blood work done while you are taking this medicine. This medicine can cause serious allergic reactions. To reduce your risk you will need to take other medicine(s) before treatment with this medicine. If you experience allergic reactions like skin rash, itching or hives, swelling of the face, lips, or tongue, tell your doctor or health care professional right away. In some cases, you may be given additional medicines to help with side effects. Follow all directions for their use. This drug may make you feel generally unwell. This is not uncommon, as chemotherapy can affect healthy cells as well as cancer cells. Report any side effects. Continue your course of treatment even though you feel ill unless your doctor tells you to stop. Call your doctor or health care professional for advice if you get a fever, chills or sore throat, or other symptoms of a cold or flu. Do not treat yourself. This drug decreases your body's ability to fight infections. Try to avoid being around people who are sick. This medicine may increase your risk to bruise   or bleed. Call your doctor or health care professional if you notice any unusual bleeding. Be careful brushing and flossing your teeth or using a toothpick because you may get an infection or bleed more easily.  If you have any dental work done, tell your dentist you are receiving this medicine. Avoid taking products that contain aspirin, acetaminophen, ibuprofen, naproxen, or ketoprofen unless instructed by your doctor. These medicines may hide a fever. Do not become pregnant while taking this medicine. Women should inform their doctor if they wish to become pregnant or think they might be pregnant. There is a potential for serious side effects to an unborn child. Talk to your health care professional or pharmacist for more information. Do not breast-feed an infant while taking this medicine. Men are advised not to father a child while receiving this medicine. This product may contain alcohol. Ask your pharmacist or healthcare provider if this medicine contains alcohol. Be sure to tell all healthcare providers you are taking this medicine. Certain medicines, like metronidazole and disulfiram, can cause an unpleasant reaction when taken with alcohol. The reaction includes flushing, headache, nausea, vomiting, sweating, and increased thirst. The reaction can last from 30 minutes to several hours. What side effects may I notice from receiving this medicine? Side effects that you should report to your doctor or health care professional as soon as possible:  allergic reactions like skin rash, itching or hives, swelling of the face, lips, or tongue  breathing problems  changes in vision  fast, irregular heartbeat  high or low blood pressure  mouth sores  pain, tingling, numbness in the hands or feet  signs of decreased platelets or bleeding - bruising, pinpoint red spots on the skin, black, tarry stools, blood in the urine  signs of decreased red blood cells - unusually weak or tired, feeling faint or lightheaded, falls  signs of infection - fever or chills, cough, sore throat, pain or difficulty passing urine  signs and symptoms of liver injury like dark yellow or brown urine; general ill feeling or  flu-like symptoms; light-colored stools; loss of appetite; nausea; right upper belly pain; unusually weak or tired; yellowing of the eyes or skin  swelling of the ankles, feet, hands  unusually slow heartbeat Side effects that usually do not require medical attention (report to your doctor or health care professional if they continue or are bothersome):  diarrhea  hair loss  loss of appetite  muscle or joint pain  nausea, vomiting  pain, redness, or irritation at site where injected  tiredness This list may not describe all possible side effects. Call your doctor for medical advice about side effects. You may report side effects to FDA at 1-800-FDA-1088. Where should I keep my medicine? This drug is given in a hospital or clinic and will not be stored at home. NOTE: This sheet is a summary. It may not cover all possible information. If you have questions about this medicine, talk to your doctor, pharmacist, or health care provider.  2020 [employer] Standard (2017-01-13 13:14:55) Paclitaxel injection What is this medicine? PACLITAXEL (PAK li TAX el) is a chemotherapy drug. It targets fast dividing cells, like cancer cells, and causes these cells to die. This medicine is used to treat ovarian cancer, breast cancer, lung cancer, Kaposi's sarcoma, and other cancers. This medicine may be used for other purposes; ask your health care provider or pharmacist if you have questions. COMMON BRAND NAME(S): Onxol, Taxol What should I tell my health care provider before I  take this medicine? They need to know if you have any of these conditions:  history of irregular heartbeat  liver disease  low blood counts, like low white cell, platelet, or red cell counts  lung or breathing disease, like asthma  tingling of the fingers or toes, or other nerve disorder  an unusual or allergic reaction to paclitaxel, alcohol, polyoxyethylated castor oil, other chemotherapy, other medicines, foods,  dyes, or preservatives  pregnant or trying to get pregnant  breast-feeding How should I use this medicine? This drug is given as an infusion into a vein. It is administered in a hospital or clinic by a specially trained health care professional. Talk to your pediatrician regarding the use of this medicine in children. Special care may be needed. Overdosage: If you think you have taken too much of this medicine contact a poison control center or emergency room at once. NOTE: This medicine is only for you. Do not share this medicine with others. What if I miss a dose? It is important not to miss your dose. Call your doctor or health care professional if you are unable to keep an appointment. What may interact with this medicine? Do not take this medicine with any of the following medications:  disulfiram  metronidazole This medicine may also interact with the following medications:  antiviral medicines for hepatitis, HIV or AIDS  certain antibiotics like erythromycin and clarithromycin  certain medicines for fungal infections like ketoconazole and itraconazole  certain medicines for seizures like carbamazepine, phenobarbital, phenytoin  gemfibrozil  nefazodone  rifampin  St. John's wort This list may not describe all possible interactions. Give your health care provider a list of all the medicines, herbs, non-prescription drugs, or dietary supplements you use. Also tell them if you smoke, drink alcohol, or use illegal drugs. Some items may interact with your medicine. What should I watch for while using this medicine? Your condition will be monitored carefully while you are receiving this medicine. You will need important blood work done while you are taking this medicine. This medicine can cause serious allergic reactions. To reduce your risk you will need to take other medicine(s) before treatment with this medicine. If you experience allergic reactions like skin rash, itching  or hives, swelling of the face, lips, or tongue, tell your doctor or health care professional right away. In some cases, you may be given additional medicines to help with side effects. Follow all directions for their use. This drug may make you feel generally unwell. This is not uncommon, as chemotherapy can affect healthy cells as well as cancer cells. Report any side effects. Continue your course of treatment even though you feel ill unless your doctor tells you to stop. Call your doctor or health care professional for advice if you get a fever, chills or sore throat, or other symptoms of a cold or flu. Do not treat yourself. This drug decreases your body's ability to fight infections. Try to avoid being around people who are sick. This medicine may increase your risk to bruise or bleed. Call your doctor or health care professional if you notice any unusual bleeding. Be careful brushing and flossing your teeth or using a toothpick because you may get an infection or bleed more easily. If you have any dental work done, tell your dentist you are receiving this medicine. Avoid taking products that contain aspirin, acetaminophen, ibuprofen, naproxen, or ketoprofen unless instructed by your doctor. These medicines may hide a fever. Do not become pregnant while taking  this medicine. Women should inform their doctor if they wish to become pregnant or think they might be pregnant. There is a potential for serious side effects to an unborn child. Talk to your health care professional or pharmacist for more information. Do not breast-feed an infant while taking this medicine. Men are advised not to father a child while receiving this medicine. This product may contain alcohol. Ask your pharmacist or healthcare provider if this medicine contains alcohol. Be sure to tell all healthcare providers you are taking this medicine. Certain medicines, like metronidazole and disulfiram, can cause an unpleasant reaction when  taken with alcohol. The reaction includes flushing, headache, nausea, vomiting, sweating, and increased thirst. The reaction can last from 30 minutes to several hours. What side effects may I notice from receiving this medicine? Side effects that you should report to your doctor or health care professional as soon as possible:  allergic reactions like skin rash, itching or hives, swelling of the face, lips, or tongue  breathing problems  changes in vision  fast, irregular heartbeat  high or low blood pressure  mouth sores  pain, tingling, numbness in the hands or feet  signs of decreased platelets or bleeding - bruising, pinpoint red spots on the skin, black, tarry stools, blood in the urine  signs of decreased red blood cells - unusually weak or tired, feeling faint or lightheaded, falls  signs of infection - fever or chills, cough, sore throat, pain or difficulty passing urine  signs and symptoms of liver injury like dark yellow or brown urine; general ill feeling or flu-like symptoms; light-colored stools; loss of appetite; nausea; right upper belly pain; unusually weak or tired; yellowing of the eyes or skin  swelling of the ankles, feet, hands  unusually slow heartbeat Side effects that usually do not require medical attention (report to your doctor or health care professional if they continue or are bothersome):  diarrhea  hair loss  loss of appetite  muscle or joint pain  nausea, vomiting  pain, redness, or irritation at site where injected  tiredness This list may not describe all possible side effects. Call your doctor for medical advice about side effects. You may report side effects to FDA at 1-800-FDA-1088. Where should I keep my medicine? This drug is given in a hospital or clinic and will not be stored at home. NOTE: This sheet is a summary. It may not cover all possible information. If you have questions about this medicine, talk to your doctor,  pharmacist, or health care provider.  2020 [employer] Standard (2017-01-13 13:14:55) Carboplatin injection What is this medicine? CARBOPLATIN (KAR boe pla tin) is a chemotherapy drug. It targets fast dividing cells, like cancer cells, and causes these cells to die. This medicine is used to treat ovarian cancer and many other cancers. This medicine may be used for other purposes; ask your health care provider or pharmacist if you have questions. COMMON BRAND NAME(S): Paraplatin What should I tell my health care provider before I take this medicine? They need to know if you have any of these conditions:  blood disorders  hearing problems  kidney disease  recent or ongoing radiation therapy  an unusual or allergic reaction to carboplatin, cisplatin, other chemotherapy, other medicines, foods, dyes, or preservatives  pregnant or trying to get pregnant  breast-feeding How should I use this medicine? This drug is usually given as an infusion into a vein. It is administered in a hospital or clinic by a specially trained health  care professional. Talk to your pediatrician regarding the use of this medicine in children. Special care may be needed. Overdosage: If you think you have taken too much of this medicine contact a poison control center or emergency room at once. NOTE: This medicine is only for you. Do not share this medicine with others. What if I miss a dose? It is important not to miss a dose. Call your doctor or health care professional if you are unable to keep an appointment. What may interact with this medicine?  medicines for seizures  medicines to increase blood counts like filgrastim, pegfilgrastim, sargramostim  some antibiotics like amikacin, gentamicin, neomycin, streptomycin, tobramycin  vaccines Talk to your doctor or health care professional before taking any of these medicines:  acetaminophen  aspirin  ibuprofen  ketoprofen  naproxen This list may  not describe all possible interactions. Give your health care provider a list of all the medicines, herbs, non-prescription drugs, or dietary supplements you use. Also tell them if you smoke, drink alcohol, or use illegal drugs. Some items may interact with your medicine. What should I watch for while using this medicine? Your condition will be monitored carefully while you are receiving this medicine. You will need important blood work done while you are taking this medicine. This drug may make you feel generally unwell. This is not uncommon, as chemotherapy can affect healthy cells as well as cancer cells. Report any side effects. Continue your course of treatment even though you feel ill unless your doctor tells you to stop. In some cases, you may be given additional medicines to help with side effects. Follow all directions for their use. Call your doctor or health care professional for advice if you get a fever, chills or sore throat, or other symptoms of a cold or flu. Do not treat yourself. This drug decreases your body's ability to fight infections. Try to avoid being around people who are sick. This medicine may increase your risk to bruise or bleed. Call your doctor or health care professional if you notice any unusual bleeding. Be careful brushing and flossing your teeth or using a toothpick because you may get an infection or bleed more easily. If you have any dental work done, tell your dentist you are receiving this medicine. Avoid taking products that contain aspirin, acetaminophen, ibuprofen, naproxen, or ketoprofen unless instructed by your doctor. These medicines may hide a fever. Do not become pregnant while taking this medicine. Women should inform their doctor if they wish to become pregnant or think they might be pregnant. There is a potential for serious side effects to an unborn child. Talk to your health care professional or pharmacist for more information. Do not breast-feed an  infant while taking this medicine. What side effects may I notice from receiving this medicine? Side effects that you should report to your doctor or health care professional as soon as possible:  allergic reactions like skin rash, itching or hives, swelling of the face, lips, or tongue  signs of infection - fever or chills, cough, sore throat, pain or difficulty passing urine  signs of decreased platelets or bleeding - bruising, pinpoint red spots on the skin, black, tarry stools, nosebleeds  signs of decreased red blood cells - unusually weak or tired, fainting spells, lightheadedness  breathing problems  changes in hearing  changes in vision  chest pain  high blood pressure  low blood counts - This drug may decrease the number of white blood cells, red blood cells  and platelets. You may be at increased risk for infections and bleeding.  nausea and vomiting  pain, swelling, redness or irritation at the injection site  pain, tingling, numbness in the hands or feet  problems with balance, talking, walking  trouble passing urine or change in the amount of urine Side effects that usually do not require medical attention (report to your doctor or health care professional if they continue or are bothersome):  hair loss  loss of appetite  metallic taste in the mouth or changes in taste This list may not describe all possible side effects. Call your doctor for medical advice about side effects. You may report side effects to FDA at 1-800-FDA-1088. Where should I keep my medicine? This drug is given in a hospital or clinic and will not be stored at home. NOTE: This sheet is a summary. It may not cover all possible information. If you have questions about this medicine, talk to your doctor, pharmacist, or health care provider.  2020 [employer] Standard (2007-08-17 14:38:05)

## 2020-04-24 NOTE — Progress Notes (Signed)
[place]  Telephone:([phone] [phone]   Name: [name] Date: 04/24/2020 MRN: [id number]  DOB: 21-Nov-1970  Patient Care Team: Physicians, [place] as PCP - General    REASON FOR CONSULTATION: Tamara [name] is a 49 y.o. female with multiple medical problems including including schizophrenia, intellectual disability, who was hospitalized 04/03/2020-11/60/21 for worsening agitation and was IVC due to acute psychosis.  Patient was found to have a large left breast mass with biopsy on 11/12 confirming invasive breast cancer.   CT of the chest/abdomen/pelvis on 11/10 showed the breast mass extending into pectoralis major muscle and possibly invading the chest wall. She also had several lymph nodes concerning for metastatic adenopathy. Treatment options were felt to be limited in part due to her schizophrenia, agitation, and poor insight. Palliative care was consulted to address goals..   SOCIAL HISTORY:     reports that she has never smoked. She has never used smokeless tobacco. She reports that she does not drink alcohol and does not use drugs.  Patient is unmarried.  She has no children.  She lives at home with her mother and father. She has two sisters who are involved in her care.   ADVANCE DIRECTIVES:  Reportedly patient's mother is her legal guardian  CODE STATUS:   PAST MEDICAL HISTORY: Past Medical History:  Diagnosis Date  . GERD (gastroesophageal reflux disease)   . IUD (intrauterine device) in place IUD was placed in 2016  . Leiomyoma of uterus   . Schizophrenia ([place])   . Thyroid disease     PAST SURGICAL HISTORY: No past surgical history on file.  HEMATOLOGY/ONCOLOGY HISTORY:  Oncology History Overview Note  # NOV 2021- LEFT BREAST CANCER - [place]; TRIPLE NEGATIVE; Grade of invasive carcinoma: Grade 3; # LYMPH NODE, LEFT AXILLA; ULTRASOUND-GUIDED CORE BIOPSY:  - METASTATIC MAMMARY CARCINOMA, AT  LEAST 9 MM METASTASIS. ]  # SURVIVORSHIP:   # GENETICS:   DIAGNOSIS:   STAGE:         ;  GOALS:  CURRENT/MOST RECENT THERAPY :     Carcinoma of overlapping sites of left breast in female, estrogen receptor negative ([place])  04/24/2020 Initial Diagnosis   Carcinoma of overlapping sites of left breast in female, estrogen receptor negative ([place])     ALLERGIES:  has No Known Allergies.  MEDICATIONS:  Current Outpatient Medications  Medication Sig Dispense Refill  . acetaminophen (TYLENOL) 650 MG suppository Place 1 suppository (650 mg total) rectally every 6 (six) hours as needed for fever. (Patient not taking: Reported on 04/24/2020) 12 suppository 0  . BANOPHEN 25 MG capsule Take 25 mg by mouth every 6 (six) hours as needed for itching.    . benztropine (COGENTIN) 0.5 MG tablet Take 1 tablet (0.5 mg total) by mouth 2 (two) times daily. 60 tablet 1  . cetirizine (ZYRTEC) 10 MG tablet Take 1 tablet (10 mg total) by mouth daily as needed for allergies. (Patient taking differently: Take 10 mg by mouth daily. )    . divalproex (DEPAKOTE) 500 MG DR tablet Take 1 tablet (500 mg total) by mouth every 12 (twelve) hours. 60 tablet 1  . haloperidol (HALDOL) 5 MG tablet Take 1 tablet (5 mg total) by mouth 3 (three) times daily. 90 tablet 1  . haloperidol decanoate (HALDOL DECANOATE) 100 MG/ML injection Inject 100 mg into the muscle every 28 (twenty-eight) days.    . hydrOXYzine (ATARAX/VISTARIL) 25 MG tablet Take 25 mg by  mouth 3 (three) times daily.    Marland Kitchen levothyroxine (SYNTHROID) 88 MCG tablet Take 1 tablet (88 mcg total) by mouth daily at 6 (six) AM. 30 tablet 1   No current facility-administered medications for this visit.    VITAL SIGNS: There were no vitals taken for this visit. There were no vitals filed for this visit.  Estimated body mass index is 26.45 kg/m as calculated from the following:   Height as of an earlier encounter on 04/24/20: _0  (1.549 m).   Weight as of an earlier  encounter on 04/24/20: 140 lb (63.5 kg).  LABS: CBC:    Component Value Date/Time   WBC 9.4 04/10/2020 0642   HGB 9.6 (L) 04/10/2020 0642   HGB 13.4 11/11/2013 2142   HCT 28.1 (L) 04/10/2020 0642   HCT 39.3 11/11/2013 2142   PLT 304 04/10/2020 0642   PLT 295 11/11/2013 2142   MCV 88.6 04/10/2020 0642   MCV 90 11/11/2013 2142   NEUTROABS 9.2 (H) 04/03/2020 1540   LYMPHSABS 1.5 04/03/2020 1540   MONOABS 1.3 (H) 04/03/2020 1540   EOSABS 0.0 04/03/2020 1540   BASOSABS 0.0 04/03/2020 1540   Comprehensive Metabolic Panel:    Component Value Date/Time   NA 137 04/10/2020 0642   NA 135 (L) 11/14/2013 1309   K 3.6 04/10/2020 0642   K 3.9 11/14/2013 1309   CL 99 04/10/2020 0642   CL 97 (L) 11/14/2013 1309   CO2 24 04/10/2020 0642   CO2 29 11/14/2013 1309   BUN <5 (L) 04/10/2020 0642   BUN 3 (L) 11/14/2013 1309   CREATININE 0.54 04/10/2020 0642   CREATININE 0.88 11/14/2013 1309   GLUCOSE 85 04/10/2020 0642   GLUCOSE 117 (H) 11/14/2013 1309   CALCIUM 9.2 04/10/2020 0642   CALCIUM 9.6 11/14/2013 1309   AST 25 04/03/2020 1540   AST 25 11/11/2013 2142   ALT 13 04/03/2020 1540   ALT 18 11/11/2013 2142   ALKPHOS 68 04/03/2020 1540   ALKPHOS 111 11/11/2013 2142   BILITOT 0.4 04/03/2020 1540   BILITOT 0.3 11/11/2013 2142   PROT 8.6 (H) 04/03/2020 1540   PROT 8.1 11/11/2013 2142   ALBUMIN 4.2 04/03/2020 1540   ALBUMIN 3.8 11/11/2013 2142    RADIOGRAPHIC STUDIES: DG Chest 1 View  Result Date: 04/06/2020 CLINICAL DATA:  Possible pneumonia EXAM: CHEST  1 VIEW COMPARISON:  04/04/2020 FINDINGS: Cardiac shadow is within normal limits. The lungs are well aerated bilaterally. No focal infiltrate or effusion is seen. No bony abnormality is noted. IMPRESSION: No acute abnormality seen. Electronically Signed   By: Inez Catalina M.D.   On: 04/06/2020 16:20   CT CHEST ABDOMEN PELVIS W CONTRAST  Result Date: 04/04/2020 CLINICAL DATA:  Left breast mass identified recently. Evaluate for  metastatic disease. EXAM: CT CHEST, ABDOMEN, AND PELVIS WITH CONTRAST TECHNIQUE: Multidetector CT imaging of the chest, abdomen and pelvis was performed following the standard protocol during bolus administration of intravenous contrast. CONTRAST:  176m OMNIPAQUE IOHEXOL 300 MG/ML  SOLN COMPARISON:  None. FINDINGS: CT CHEST FINDINGS Cardiovascular: The heart size is within normal limits. No pericardial effusion identified. Mediastinum/Nodes: Normal appearance of the thyroid gland. The trachea appears patent and is midline. Normal appearance of the esophagus. Multiple enlarged left axillary lymph nodes are identified. Index node measures 2.1 cm, image 18/4. Adjacent left axillary lymph node measures 2.8 cm, image 22/4. Enlarged left retropectoral lymph node measures 1.5 cm, image 13/4. Prominent left internal mammary lymph nodes are identified. The  largest measures 9 mm short axis, image 21/4. Left supraclavicular lymph node measures 1 cm, image 9/4. No enlarged right axillary or supraclavicular lymph nodes. No enlarged mediastinal or hilar lymph nodes. Lungs/Pleura: No pleural effusion. Patchy nonspecific areas of ground-glass attenuation and subsegmental atelectasis noted within the lung bases. No suspicious solid lung nodules identified to suggest pulmonary metastases. Musculoskeletal: Large left breast mass is noted measuring 8.4 x 6.8 cm, image 24/4. This extends posteriorly to the pectoralis major muscle and extends anteriorly to the skin surface. There is overlying skin thickening noted. Cannot exclude chest wall involvement. CT ABDOMEN PELVIS FINDINGS Hepatobiliary: No focal liver abnormality is seen. No gallstones, gallbladder wall thickening, or biliary dilatation. Pancreas: Unremarkable. No pancreatic ductal dilatation or surrounding inflammatory changes. Spleen: Normal in size without focal abnormality. Adrenals/Urinary Tract: The adrenal glands appear normal. No kidney mass identified bilaterally.  Moderate distension of the urinary bladder. No focal bladder abnormality. Stomach/Bowel: Stomach is within normal limits. Appendix appears normal. No evidence of bowel wall thickening, distention, or inflammatory changes. Vascular/Lymphatic: Normal appearance of the abdominal aorta. No abdominopelvic adenopathy. Reproductive: Multiple enhancing pelvic masses are identified which appear closely associated with the uterus. Some of these likely represent uterine fibroids. Within the left pelvis there is a mixed attenuation mass which measures 3.9 x 3.2 by 2.8 cm. This contains a focal area of fat attenuation measuring 1.2 cm, image 78/8. Other: There is no free fluid identified within the abdomen or pelvis. No focal fluid collections identified. Increased soft tissue stranding S is identified within the left upper quadrant peritoneal fat, image 63/4. No discrete peritoneal nodule or mass noted however. Musculoskeletal: No acute or significant osseous findings. IMPRESSION: 1. Large left breast mass is identified compatible with primary breast carcinoma. This extends posteriorly to the pectoralis major muscle and extends anteriorly to the skin surface. Cannot exclude chest wall involvement. 2. Enlarged left axillary, left retropectoral, and left internal mammary lymph nodes compatible with metastatic adenopathy. 3. Soft tissue stranding within the left upper quadrant of the abdomen is identified. There is also mild soft tissue stranding is in the omentum. No discrete measurable nodule identified. Although not diagnostic for peritoneal disease, given the extent of tumor involvement in the left breast and left axilla early peritoneal disease cannot be excluded. 4. Multiple enhancing pelvic masses are identified which appear closely associated with the uterus. Some of these likely represent uterine fibroids. Within the left pelvis there is a mixed attenuation mass which contains a focal area of fat attenuation. Which is  favored to represent a benign ovarian dermoid. Advise further evaluation with pelvic sonogram for more definitive characterization. Electronically Signed   By: Kerby Moors M.D.   On: 04/04/2020 19:28   US BREAST LTD UNI LEFT INC AXILLA  Result Date: 04/03/2020 CLINICAL DATA:  Patient presents with redness of the left breast and a large left breast mass. The patient was evaluated through the emergency department for evaluation of possible abscess. EXAM: ULTRASOUND OF THE LEFT BREAST COMPARISON:  None. FINDINGS: On physical exam, there is a large mass within the left breast. Targeted ultrasound is performed, showing a 6.3 x 6.2 x 7.1 cm lobular hypoechoic solid mass left breast 1 o'clock position (upper-outer quadrant). Multiple abnormal cortically thickened left axillary lymph nodes are demonstrated measuring up to 2.6 cm with effacement of the fatty hilum. IMPRESSION: 1. Large solid mass left breast 1 o'clock position concerning for malignancy. 2. Multiple abnormal cortically thickened left axillary lymph nodes concerning for metastatic adenopathy.  RECOMMENDATION: 1. Recommend evaluation of the right and left breast with dedicated mammography and ultrasound at a dedicated breast center. This will allow for comprehensive evaluation given the concern of left breast malignancy. 2. Ultimately patient will need at least a biopsy of the large palpable left breast mass and 1 of the left axillary lymph nodes. BI-RADS CATEGORY  5: Highly suggestive of malignancy. These results were called by telephone at the time of interpretation on 04/03/2020 at 4:33 pm to provider Dr. Charna Archer, who verbally acknowledged these results. Electronically Signed   By: Lovey Newcomer M.D.   On: 04/03/2020 16:51   [place] LT BREAST BX W LOC DEV 1ST LESION IMG BX SPEC US GUIDE  Addendum Date: 04/12/2020   ADDENDUM REPORT: 04/12/2020 15:04 ADDENDUM: PATHOLOGY revealed: Site A. LEFT BREAST MASS; ULTRASOUND-GUIDED CORE BIOPSY: - INVASIVE MAMMARY  CARCINOMA, NO SPECIAL TYPE, SEE COMMENT. Size of invasive carcinoma: Not assessed, fragmented cores. Grade 3. Ductal carcinoma in situ: Not identified. Lymphovascular invasion: Not identified. Pathology results are CONCORDANT with imaging findings, per Dr. Fidela Salisbury. PATHOLOGY revealed: Site B. LYMPH NODE, LEFT AXILLA; ULTRASOUND-GUIDED CORE BIOPSY: - METASTATIC MAMMARY CARCINOMA, AT LEAST 9 MM METASTASIS. Comment: All of the cores in A and B contain a morphologically poorly differentiated malignant neoplasm, non-small cell type, with prominent cellular dyscohesion. No benign breast tissue is identified in A, and no lymphoid tissue is in B. Pathology results are CONCORDANT with imaging findings, per Dr. Fidela Salisbury. I was unable to speak with patient as she is currently undergoing inpatient hospitalization. Dr. Bryan Lemma [facility] pathologist) contacted Dr. Charlaine Dalton (patient's medical oncologist and provider) with patient's biopsy results. [name] provider will manage oncological care, and will arrange surgical consultation as indicated. Recommendation: Oncological and surgical consultation. [name] provider (Dr. Charlaine Dalton) will manage all oncological care and provide surgical consultation as indicated. Pathology results reported by Electa Sniff RN on 04/12/2020. Electronically Signed   By: Fidela Salisbury M.D.   On: 04/12/2020 15:04   Result Date: 04/12/2020 CLINICAL DATA:  Left breast 1 o'clock large mass. EXAM: ULTRASOUND GUIDED LEFT BREAST CORE NEEDLE BIOPSY COMPARISON:  Previous exam(s). PROCEDURE: I met with the patient and we discussed the procedure of ultrasound-guided biopsy, including benefits and alternatives. We discussed the high likelihood of a successful procedure. We discussed the risks of the procedure, including infection, bleeding, tissue injury, clip migration, and inadequate sampling. Informed written consent was given. The usual time-out protocol was  performed immediately prior to the procedure. Lesion quadrant: All quadrants of the breast. Using sterile technique and 1% Lidocaine as local anesthetic, under direct ultrasound visualization, a 14 gauge spring-loaded device was used to perform biopsy of left breast 1 o'clock mass using a lateral approach. At the conclusion of the procedure vision tissue marker clip was deployed into the biopsy cavity. Next, using sterile technique and 1% Lidocaine as local anesthetic, under direct ultrasound visualization, a 14 gauge spring-loaded device was used to perform biopsy of left axillary lymph node using a lateral approach. At the conclusion of the procedure shaped 3 HydroMARK tissue marker clip was deployed into the biopsy cavity Follow up 2 view mammogram was not performed as the patient was sedated and unsafe to stand up. IMPRESSION: Ultrasound guided biopsy of large left breast mass and an abnormal left axillary lymph node. No apparent complications. Electronically Signed: By: Fidela Salisbury M.D. On: 04/06/2020 10:17   [place] LT BREAST BX W LOC DEV EA ADD LESION IMG BX SPEC US GUIDE  Addendum  Date: 04/12/2020   ADDENDUM REPORT: 04/12/2020 15:04 ADDENDUM: PATHOLOGY revealed: Site A. LEFT BREAST MASS; ULTRASOUND-GUIDED CORE BIOPSY: - INVASIVE MAMMARY CARCINOMA, NO SPECIAL TYPE, SEE COMMENT. Size of invasive carcinoma: Not assessed, fragmented cores. Grade 3. Ductal carcinoma in situ: Not identified. Lymphovascular invasion: Not identified. Pathology results are CONCORDANT with imaging findings, per Dr. Fidela Salisbury. PATHOLOGY revealed: Site B. LYMPH NODE, LEFT AXILLA; ULTRASOUND-GUIDED CORE BIOPSY: - METASTATIC MAMMARY CARCINOMA, AT LEAST 9 MM METASTASIS. Comment: All of the cores in A and B contain a morphologically poorly differentiated malignant neoplasm, non-small cell type, with prominent cellular dyscohesion. No benign breast tissue is identified in A, and no lymphoid tissue is in B. Pathology results  are CONCORDANT with imaging findings, per Dr. Fidela Salisbury. I was unable to speak with patient as she is currently undergoing inpatient hospitalization. Dr. Bryan Lemma [facility] pathologist) contacted Dr. Charlaine Dalton (patient's medical oncologist and provider) with patient's biopsy results. [name] provider will manage oncological care, and will arrange surgical consultation as indicated. Recommendation: Oncological and surgical consultation. [name] provider (Dr. Charlaine Dalton) will manage all oncological care and provide surgical consultation as indicated. Pathology results reported by Electa Sniff RN on 04/12/2020. Electronically Signed   By: Fidela Salisbury M.D.   On: 04/12/2020 15:04   Result Date: 04/12/2020 CLINICAL DATA:  Left breast 1 o'clock large mass. EXAM: ULTRASOUND GUIDED LEFT BREAST CORE NEEDLE BIOPSY COMPARISON:  Previous exam(s). PROCEDURE: I met with the patient and we discussed the procedure of ultrasound-guided biopsy, including benefits and alternatives. We discussed the high likelihood of a successful procedure. We discussed the risks of the procedure, including infection, bleeding, tissue injury, clip migration, and inadequate sampling. Informed written consent was given. The usual time-out protocol was performed immediately prior to the procedure. Lesion quadrant: All quadrants of the breast. Using sterile technique and 1% Lidocaine as local anesthetic, under direct ultrasound visualization, a 14 gauge spring-loaded device was used to perform biopsy of left breast 1 o'clock mass using a lateral approach. At the conclusion of the procedure vision tissue marker clip was deployed into the biopsy cavity. Next, using sterile technique and 1% Lidocaine as local anesthetic, under direct ultrasound visualization, a 14 gauge spring-loaded device was used to perform biopsy of left axillary lymph node using a lateral approach. At the conclusion of the procedure shaped 3 HydroMARK  tissue marker clip was deployed into the biopsy cavity Follow up 2 view mammogram was not performed as the patient was sedated and unsafe to stand up. IMPRESSION: Ultrasound guided biopsy of large left breast mass and an abnormal left axillary lymph node. No apparent complications. Electronically Signed: By: Fidela Salisbury M.D. On: 04/06/2020 10:17    PERFORMANCE STATUS (ECOG) : 1 - Symptomatic but completely ambulatory  Review of Systems Unless otherwise noted, a complete review of systems is negative.  Physical Exam General: NAD Pulmonary: Unlabored Abdomen: soft, nontender, + bowel sounds GU: no suprapubic tenderness Extremities: no edema, no joint deformities Skin: no rashes Neurological: Weakness, confusion  IMPRESSION: Post hospital follow-up with patient. Patient was accompanied by her sister. Reportedly, patient's mother is the legal guardian but is currently undergoing health problems and has deferred decision-making to patient's sister, Tamara [name].  Per Tamara [name], family is interested in pursuing treatment. [name] sister verbalized an understanding that treatment is with palliative intent. She recognizes that systemic treatment may be associated with a high symptom burden and could negatively impact patient's quality of life. She also verbalized understanding that patient may not tolerate treatment  from a psychiatric standpoint. Despite this, plan is to undergo a low-dose weekly cisplatin in hopes that the tumor will shrink enough that patient can tolerate surgical resection.  Per sister, patient psychiatrist is Dr. Loni Muse at [facility] in [place]. I called and left a message for Dr. Loni Muse at [phone].  Also attempted to call patient's mother but was unable to reach her.  Sister will bring in guardianship papers to clinic to be scanned into chart.   I sent patient sister home with a MOST form to discuss end-of-life decision-making with family. I previously discussed this in  the hospital and family wanted patient to remain a full code at that time.   PLAN: -Continue current scope of treatment -We'll coordinate care with patient's community psychiatrist -RTC in 2-3 weeks   Case and plan discussed with Dr. Rogue Bussing  Patient expressed understanding and was in agreement with this plan. She also understands that She can call the clinic at any time with any questions, concerns, or complaints.     Time Total:30 minutes  Visit consisted of counseling and education dealing with the complex and emotionally intense issues of symptom management and palliative care in the setting of serious and potentially life-threatening illness.Greater than 50%  of this time was spent counseling and coordinating care related to the above assessment and plan.  Signed by: Altha Harm, PhD, NP-C

## 2020-04-24 NOTE — Telephone Encounter (Signed)
Submitted foundation one for patient with pdl testing (Tecentriq) per Dr. Rogue Bussing.

## 2020-04-24 NOTE — Assessment & Plan Note (Addendum)
#  Left breast cancer T4 N2 M0-stage III triple negative.  I reviewed the pathology and stage with the patient/sister in detail.  Discussed the significant concerns for significant locally advanced disease.  Recommend NGS testing [if BRCA positive can consider PARP inhibitor if poor tolerance to therapy].  Recommend a PET scan.  Will also need MRI brain.  #Ideal situation would recommend neoadjuvant chemotherapy with carbotaxol Keytruda-Adriamycin Cytoxan.  However I am concerned about potential side effects and poor tolerance to therapy-given her underlying schizophrenia.  As per Dr. Peyton [name] patient is a poor candidate for upfront surgery.  #I would rather suggest weekly carbotaxol x12; and then proceed with Adriamycin Cytoxan if patient tolerating chemotherapy better.  Patient will also need 2D echo.  We will send antiemetics.  Discussed the potential side effects including but not limited to-increasing fatigue, nausea vomiting, diarrhea, hair loss, sores in the mouth, increase risk of infection and also neuropathy.    # schizophrenia: Follows up with [facility] [phone]; Dr.A].  Tamara [name] will reach out to psychiatry to discuss patient's plan of care.  [name] legal guardian; however mother unavailable appointments.  Discussed with Tamara [name].  Will reach out to patient's mother.   Above plan of care was discussed with [name] in detail.  Then recommend.   # DISPOSITION: # labs today- cbc/cmp; iron studies/ferritin; thyroid profile-CA-27-29; CEA; Ca 15-3;LDH # PET ASAP # chemo ed ASAP # in 1 week-MD;  labs-cbc/CMP;CA-27-29; CEA; Ca 15-3; carbo-taxol-weekly chemo # in 2 weeks- MD; labs-cbc/CMP; carbo-taxol-weekly- Dr.B  Addendum poor IV access: We will discuss with patient's sister regarding port placement.  If negative then will recommend port placement with Dr. Peyton [name].

## 2020-04-24 NOTE — Progress Notes (Signed)
[facility] CONSULT NOTE  Patient Care Team: Physicians, [facility] as PCP - General  CHIEF COMPLAINTS/PURPOSE OF CONSULTATION: Breast cancer  #  Oncology History Overview Note  # NOV 2021- LEFT BREAST CANCER - [facility]; TRIPLE NEGATIVE; Grade of invasive carcinoma: Grade 3; # LYMPH NODE, LEFT AXILLA; ULTRASOUND-GUIDED CORE BIOPSY:  - METASTATIC MAMMARY CARCINOMA, AT LEAST 9 MM METASTASIS. ]  # SURVIVORSHIP:   # GENETICS:   DIAGNOSIS:   STAGE:         ;  GOALS:  CURRENT/MOST RECENT THERAPY :     Carcinoma of overlapping sites of left breast in female, estrogen receptor negative ([place])  04/24/2020 Initial Diagnosis   Carcinoma of overlapping sites of left breast in female, estrogen receptor negative ([facility])   04/24/2020 -  Chemotherapy   The patient had dexamethasone (DECADRON) 4 MG tablet, 8 mg, Oral, Daily, 0 of 1 cycle, Start date: --, End date: -- PALONOSETRON HCL INJECTION 0.25 MG/5ML, 0.25 mg, Intravenous,  Once, 0 of 12 cycles CARBOplatin (PARAPLATIN) in sodium chloride 0.9 % 100 mL chemo infusion, , Intravenous,  Once, 0 of 12 cycles PACLitaxel (TAXOL) 132 mg in sodium chloride 0.9 % 250 mL chemo infusion (</= 31m/m2), 80 mg/m2, Intravenous,  Once, 0 of 12 cycles  for chemotherapy treatment.       HISTORY OF PRESENTING ILLNESS: Patient is accompanied by her sister ALuellen Pucker [name]89y.o.  female with underlying schizophrenia was recently evaluated hospital for agitation.  During the work-up patient noted to have a large left breast mass which led to further work-up including ultrasound/biopsy.  Summarized above.  Patient is currently home.  Denies any significant pain.  No nausea no vomiting no headaches.   Review of Systems  Unable to perform ROS: Psychiatric disorder     MEDICAL HISTORY:  Past Medical History:  Diagnosis Date  . GERD (gastroesophageal reflux disease)   . IUD (intrauterine device) in place IUD was placed in 2016  .  Leiomyoma of uterus   . Sch[place]ia [place]   . Thyroid disease     SURGICAL HISTORY: No past surgical history on file.  SOCIAL HISTORY: Social History   Socioeconomic History  . Marital status: Single    Spouse name: Not on file  . Number of children: Not on file  . Years of education: Not on file  . Highest education level: Not on file  Occupational History  . Not on file  Tobacco Use  . Smoking status: Never Smoker  . Smokeless tobacco: Never Used  Substance and Sexual Activity  . Alcohol use: No  . Drug use: No  . Sexual activity: Yes    Birth control/protection: Surgical  Other Topics Concern  . Not on file  Social History Narrative  . Not on file   Social Determinants of Health   Financial Resource Strain:   . Difficulty of Paying Living Expenses: Not on file  Food Insecurity:   . Worried About RCharity fundraiserin the Last Year: Not on file  . Ran Out of Food in the Last Year: Not on file  Transportation Needs:   . Lack of Transportation (Medical): Not on file  . Lack of Transportation (Non-Medical): Not on file  Physical Activity:   . Days of Exercise per Week: Not on file  . Minutes of Exercise per Session: Not on file  Stress:   . Feeling of Stress : Not on file  Social Connections:   . Frequency  of Communication with Friends and Family: Not on file  . Frequency of Social Gatherings with Friends and Family: Not on file  . Attends Religious Services: Not on file  . Active Member of Clubs or Organizations: Not on file  . Attends Archivist Meetings: Not on file  . Marital Status: Not on file  Intimate Partner Violence:   . Fear of Current or Ex-Partner: Not on file  . Emotionally Abused: Not on file  . Physically Abused: Not on file  . Sexually Abused: Not on file    FAMILY HISTORY: No family history on file.  ALLERGIES:  has No Known Allergies.  MEDICATIONS:  Current Outpatient Medications  Medication Sig Dispense Refill  .  BANOPHEN 25 MG capsule Take 25 mg by mouth every 6 (six) hours as needed for itching.    . benztropine (COGENTIN) 0.5 MG tablet Take 1 tablet (0.5 mg total) by mouth 2 (two) times daily. 60 tablet 1  . cetirizine (ZYRTEC) 10 MG tablet Take 1 tablet (10 mg total) by mouth daily as needed for allergies. (Patient taking differently: Take 10 mg by mouth daily. )    . divalproex (DEPAKOTE) 500 MG DR tablet Take 1 tablet (500 mg total) by mouth every 12 (twelve) hours. 60 tablet 1  . haloperidol (HALDOL) 5 MG tablet Take 1 tablet (5 mg total) by mouth 3 (three) times daily. 90 tablet 1  . haloperidol decanoate (HALDOL DECANOATE) 100 MG/ML injection Inject 100 mg into the muscle every 28 (twenty-eight) days.    . hydrOXYzine (ATARAX/VISTARIL) 25 MG tablet Take 25 mg by mouth 3 (three) times daily.    Marland Kitchen levothyroxine (SYNTHROID) 88 MCG tablet Take 1 tablet (88 mcg total) by mouth daily at 6 (six) AM. 30 tablet 1  . acetaminophen (TYLENOL) 650 MG suppository Place 1 suppository (650 mg total) rectally every 6 (six) hours as needed for fever. (Patient not taking: Reported on 04/24/2020) 12 suppository 0  . ondansetron (ZOFRAN) 8 MG tablet One pill every 8 hours as needed for nausea/vomitting. 40 tablet 1  . prochlorperazine (COMPAZINE) 10 MG tablet Take 1 tablet (10 mg total) by mouth every 6 (six) hours as needed for nausea or vomiting. 40 tablet 1   No current facility-administered medications for this visit.      Marland Kitchen  PHYSICAL EXAMINATION: ECOG PERFORMANCE STATUS: 1 - Symptomatic but completely ambulatory  Vitals:   04/24/20 1115  BP: 111/69  Pulse: (!) 109  Resp: 16  Temp: 97.8 F (36.6 C)  SpO2: 100%   Filed Weights   04/24/20 1115  Weight: 140 lb (63.5 kg)    Physical Exam Constitutional:      Comments: Accompanied by sister.  Ambulating independently.  HENT:     Head: Normocephalic and atraumatic.     Mouth/Throat:     Pharynx: No oropharyngeal exudate.  Eyes:     Pupils: Pupils  are equal, round, and reactive to light.  Cardiovascular:     Rate and Rhythm: Normal rate and regular rhythm.  Pulmonary:     Effort: No respiratory distress.     Breath sounds: No wheezing.  Abdominal:     General: Bowel sounds are normal. There is no distension.     Palpations: Abdomen is soft. There is no mass.     Tenderness: There is no abdominal tenderness. There is no guarding or rebound.  Musculoskeletal:        General: No tenderness. Normal range of motion.  Cervical back: Normal range of motion and neck supple.  Skin:    General: Skin is warm.     Comments: A large tense breast noted left side; satellite nodule noted.  Skin appears edematous mildly warmth.  No discharge.  Bulky left axillary adenopathy noted.  Neurological:     Mental Status: She is alert and oriented to person, place, and time.  Psychiatric:        Mood and Affect: Affect normal.     Comments: Poor insight/tangential thoughts.  Unable to make a coherent conversation-underlying schizophrenia.      LABORATORY DATA:  I have reviewed the data as listed Lab Results  Component Value Date   WBC 9.4 04/10/2020   HGB 9.6 (L) 04/10/2020   HCT 28.1 (L) 04/10/2020   MCV 88.6 04/10/2020   PLT 304 04/10/2020   Recent Labs    06/05/19 1806 06/05/19 1806 06/05/19 2117 06/05/19 2117 06/07/19 1145 06/07/19 1145 04/03/20 1540 04/05/20 0400 04/08/20 0619 04/09/20 0432 04/10/20 0642  NA 127*   < > 132*   < > 135   < > 125*   < > 133* 135 137  K 4.8   < > 3.5   < > 4.4   < > 3.1*   < > 3.4* 3.6 3.6  CL 93*   < > 101   < > 98   < > 88*   < > 99 101 99  CO2 26   < > 24   < > 26   < > 24   < > 25 24 24   GLUCOSE 90   < > 167*   < > 97   < > 153*   < > 100* 99 85  BUN <5*   < > <5*   < > 8   < > <5*   < > <5* <5* <5*  CREATININE 0.51   < > 0.51   < > 0.76   < > 0.65   < > 0.56 0.65 0.54  CALCIUM 8.7*   < > 8.1*   < > 10.1   < > 9.5   < > 8.6* 8.8* 9.2  GFRNONAA >60   < > >60   < > >60  --  >60   < > >60  >60 >60  GFRAA >60  --  >60  --  >60  --   --   --   --   --   --   PROT 7.1  --   --   --  7.6  --  8.6*  --   --   --   --   ALBUMIN 3.6  --   --   --  3.5  --  4.2  --   --   --   --   AST 15  --   --   --  15  --  25  --   --   --   --   ALT 7  --   --   --  8  --  13  --   --   --   --   ALKPHOS 64  --   --   --  66  --  68  --   --   --   --   BILITOT 0.6  --   --   --  0.4  --  0.4  --   --   --   --    < > =  values in this interval not displayed.    RADIOGRAPHIC STUDIES: I have personally reviewed the radiological images as listed and agreed with the findings in the report. DG Chest 1 View  Result Date: 04/06/2020 CLINICAL DATA:  Possible pneumonia EXAM: CHEST  1 VIEW COMPARISON:  04/04/2020 FINDINGS: Cardiac shadow is within normal limits. The lungs are well aerated bilaterally. No focal infiltrate or effusion is seen. No bony abnormality is noted. IMPRESSION: No acute abnormality seen. Electronically Signed   By: Inez Catalina M.D.   On: 04/06/2020 16:20   CT CHEST ABDOMEN PELVIS W CONTRAST  Result Date: 04/04/2020 CLINICAL DATA:  Left breast mass identified recently. Evaluate for metastatic disease. EXAM: CT CHEST, ABDOMEN, AND PELVIS WITH CONTRAST TECHNIQUE: Multidetector CT imaging of the chest, abdomen and pelvis was performed following the standard protocol during bolus administration of intravenous contrast. CONTRAST:  16m OMNIPAQUE IOHEXOL 300 MG/ML  SOLN COMPARISON:  None. FINDINGS: CT CHEST FINDINGS Cardiovascular: The heart size is within normal limits. No pericardial effusion identified. Mediastinum/Nodes: Normal appearance of the thyroid gland. The trachea appears patent and is midline. Normal appearance of the esophagus. Multiple enlarged left axillary lymph nodes are identified. Index node measures 2.1 cm, image 18/4. Adjacent left axillary lymph node measures 2.8 cm, image 22/4. Enlarged left retropectoral lymph node measures 1.5 cm, image 13/4. Prominent left internal  mammary lymph nodes are identified. The largest measures 9 mm short axis, image 21/4. Left supraclavicular lymph node measures 1 cm, image 9/4. No enlarged right axillary or supraclavicular lymph nodes. No enlarged mediastinal or hilar lymph nodes. Lungs/Pleura: No pleural effusion. Patchy nonspecific areas of ground-glass attenuation and subsegmental atelectasis noted within the lung bases. No suspicious solid lung nodules identified to suggest pulmonary metastases. Musculoskeletal: Large left breast mass is noted measuring 8.4 x 6.8 cm, image 24/4. This extends posteriorly to the pectoralis major muscle and extends anteriorly to the skin surface. There is overlying skin thickening noted. Cannot exclude chest wall involvement. CT ABDOMEN PELVIS FINDINGS Hepatobiliary: No focal liver abnormality is seen. No gallstones, gallbladder wall thickening, or biliary dilatation. Pancreas: Unremarkable. No pancreatic ductal dilatation or surrounding inflammatory changes. Spleen: Normal in size without focal abnormality. Adrenals/Urinary Tract: The adrenal glands appear normal. No kidney mass identified bilaterally. Moderate distension of the urinary bladder. No focal bladder abnormality. Stomach/Bowel: Stomach is within normal limits. Appendix appears normal. No evidence of bowel wall thickening, distention, or inflammatory changes. Vascular/Lymphatic: Normal appearance of the abdominal aorta. No abdominopelvic adenopathy. Reproductive: Multiple enhancing pelvic masses are identified which appear closely associated with the uterus. Some of these likely represent uterine fibroids. Within the left pelvis there is a mixed attenuation mass which measures 3.9 x 3.2 by 2.8 cm. This contains a focal area of fat attenuation measuring 1.2 cm, image 78/8. Other: There is no free fluid identified within the abdomen or pelvis. No focal fluid collections identified. Increased soft tissue stranding S is identified within the left upper  quadrant peritoneal fat, image 63/4. No discrete peritoneal nodule or mass noted however. Musculoskeletal: No acute or significant osseous findings. IMPRESSION: 1. Large left breast mass is identified compatible with primary breast carcinoma. This extends posteriorly to the pectoralis major muscle and extends anteriorly to the skin surface. Cannot exclude chest wall involvement. 2. Enlarged left axillary, left retropectoral, and left internal mammary lymph nodes compatible with metastatic adenopathy. 3. Soft tissue stranding within the left upper quadrant of the abdomen is identified. There is also mild soft tissue stranding is in the  omentum. No discrete measurable nodule identified. Although not diagnostic for peritoneal disease, given the extent of tumor involvement in the left breast and left axilla early peritoneal disease cannot be excluded. 4. Multiple enhancing pelvic masses are identified which appear closely associated with the uterus. Some of these likely represent uterine fibroids. Within the left pelvis there is a mixed attenuation mass which contains a focal area of fat attenuation. 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 Advise further evaluation with pelvic sonogram for more definitive characterization. Electronically Signed   By: Kerby Moors M.D.   On: 04/04/2020 19:28   US BREAST LTD UNI LEFT INC AXILLA  Result Date: 04/03/2020 CLINICAL DATA:  Patient presents with redness of the left breast and a large left breast mass. The patient was evaluated through the emergency department for evaluation of possible abscess. EXAM: ULTRASOUND OF THE LEFT BREAST COMPARISON:  None. FINDINGS: On physical exam, there is a large mass within the left breast. Targeted ultrasound is performed, showing a 6.3 x 6.2 x 7.1 cm lobular hypoechoic solid mass left breast 1 o'clock position (upper-outer quadrant). Multiple abnormal cortically thickened left axillary lymph nodes are demonstrated measuring  up to 2.6 cm with effacement of the fatty hilum. IMPRESSION: 1. Large solid mass left breast 1 o'clock position concerning for malignancy. 2. Multiple abnormal cortically thickened left axillary lymph nodes concerning for metastatic adenopathy. RECOMMENDATION: 1. Recommend evaluation of the right and left breast with dedicated mammography and ultrasound at a dedicated breast center. This will allow for comprehensive evaluation given the concern of left breast malignancy. 2. Ultimately patient will need at least a biopsy of the large palpable left breast mass and 1 of the left axillary lymph nodes. BI-RADS CATEGORY  5: Highly suggestive of malignancy. These results were called by telephone at the time of interpretation on 04/03/2020 at 4:33 pm to provider Dr. Charna Archer, who verbally acknowledged these results. Electronically Signed   By: Lovey Newcomer M.D.   On: 04/03/2020 16:51   [place] LT BREAST BX W LOC DEV 1ST LESION IMG BX SPEC US GUIDE  Addendum Date: 04/12/2020   ADDENDUM REPORT: 04/12/2020 15:04 ADDENDUM: PATHOLOGY revealed: Site A. LEFT BREAST MASS; ULTRASOUND-GUIDED CORE BIOPSY: - INVASIVE MAMMARY CARCINOMA, NO SPECIAL TYPE, SEE COMMENT. Size of invasive carcinoma: Not assessed, fragmented cores. Grade 3. Ductal carcinoma in situ: Not identified. Lymphovascular invasion: Not identified. Pathology results are CONCORDANT with imaging findings, per Dr. Fidela Salisbury. PATHOLOGY revealed: Site B. LYMPH NODE, LEFT AXILLA; ULTRASOUND-GUIDED CORE BIOPSY: - METASTATIC MAMMARY CARCINOMA, AT LEAST 9 MM METASTASIS. Comment: All of the cores in A and B contain a morphologically poorly differentiated malignant neoplasm, non-small cell type, with prominent cellular dyscohesion. No benign breast tissue is identified in A, and no lymphoid tissue is in B. Pathology results are CONCORDANT with imaging findings, per Dr. Fidela Salisbury. I was unable to speak with patient as she is currently undergoing inpatient  hospitalization. Dr. Bryan Lemma [facility] pathologist) contacted Dr. Charlaine Dalton (patient's medical oncologist and provider) with patient's biopsy results. [name] provider will manage oncological care, and will arrange surgical consultation as indicated. Recommendation: Oncological and surgical consultation. [name] provider (Dr. Charlaine Dalton) will manage all oncological care and provide surgical consultation as indicated. Pathology results reported by Electa Sniff RN on 04/12/2020. Electronically Signed   By: Fidela Salisbury M.D.   On: 04/12/2020 15:04   Result Date: 04/12/2020 CLINICAL DATA:  Left breast 1 o'clock large mass. EXAM: ULTRASOUND GUIDED LEFT BREAST CORE NEEDLE  BIOPSY COMPARISON:  Previous exam(s). PROCEDURE: I met with the patient and we discussed the procedure of ultrasound-guided biopsy, including benefits and alternatives. We discussed the high likelihood of a successful procedure. We discussed the risks of the procedure, including infection, bleeding, tissue injury, clip migration, and inadequate sampling. Informed written consent was given. The usual time-out protocol was performed immediately prior to the procedure. Lesion quadrant: All quadrants of the breast. Using sterile technique and 1% Lidocaine as local anesthetic, under direct ultrasound visualization, a 14 gauge spring-loaded device was used to perform biopsy of left breast 1 o'clock mass using a lateral approach. At the conclusion of the procedure vision tissue marker clip was deployed into the biopsy cavity. Next, using sterile technique and 1% Lidocaine as local anesthetic, under direct ultrasound visualization, a 14 gauge spring-loaded device was used to perform biopsy of left axillary lymph node using a lateral approach. At the conclusion of the procedure shaped 3 HydroMARK tissue marker clip was deployed into the biopsy cavity Follow up 2 view mammogram was not performed as the patient was sedated and unsafe to  stand up. IMPRESSION: Ultrasound guided biopsy of large left breast mass and an abnormal left axillary lymph node. No apparent complications. Electronically Signed: By: Fidela Salisbury M.D. On: 04/06/2020 10:17   [place] LT BREAST BX W LOC DEV EA ADD LESION IMG BX SPEC US GUIDE  Addendum Date: 04/12/2020   ADDENDUM REPORT: 04/12/2020 15:04 ADDENDUM: PATHOLOGY revealed: Site A. LEFT BREAST MASS; ULTRASOUND-GUIDED CORE BIOPSY: - INVASIVE MAMMARY CARCINOMA, NO SPECIAL TYPE, SEE COMMENT. Size of invasive carcinoma: Not assessed, fragmented cores. Grade 3. Ductal carcinoma in situ: Not identified. Lymphovascular invasion: Not identified. Pathology results are CONCORDANT with imaging findings, per Dr. Fidela Salisbury. PATHOLOGY revealed: Site B. LYMPH NODE, LEFT AXILLA; ULTRASOUND-GUIDED CORE BIOPSY: - METASTATIC MAMMARY CARCINOMA, AT LEAST 9 MM METASTASIS. Comment: All of the cores in A and B contain a morphologically poorly differentiated malignant neoplasm, non-small cell type, with prominent cellular dyscohesion. No benign breast tissue is identified in A, and no lymphoid tissue is in B. Pathology results are CONCORDANT with imaging findings, per Dr. Fidela Salisbury. I was unable to speak with patient as she is currently undergoing inpatient hospitalization. Dr. Bryan Lemma [facility] pathologist) contacted Dr. Charlaine Dalton (patient's medical oncologist and provider) with patient's biopsy results. [name] provider will manage oncological care, and will arrange surgical consultation as indicated. Recommendation: Oncological and surgical consultation. [name] provider (Dr. Charlaine Dalton) will manage all oncological care and provide surgical consultation as indicated. Pathology results reported by Electa Sniff RN on 04/12/2020. Electronically Signed   By: Fidela Salisbury M.D.   On: 04/12/2020 15:04   Result Date: 04/12/2020 CLINICAL DATA:  Left breast 1 o'clock large mass. EXAM: ULTRASOUND GUIDED  LEFT BREAST CORE NEEDLE BIOPSY COMPARISON:  Previous exam(s). PROCEDURE: I met with the patient and we discussed the procedure of ultrasound-guided biopsy, including benefits and alternatives. We discussed the high likelihood of a successful procedure. We discussed the risks of the procedure, including infection, bleeding, tissue injury, clip migration, and inadequate sampling. Informed written consent was given. The usual time-out protocol was performed immediately prior to the procedure. Lesion quadrant: All quadrants of the breast. Using sterile technique and 1% Lidocaine as local anesthetic, under direct ultrasound visualization, a 14 gauge spring-loaded device was used to perform biopsy of left breast 1 o'clock mass using a lateral approach. At the conclusion of the procedure vision tissue marker clip was deployed into the biopsy cavity. Next,  using sterile technique and 1% Lidocaine as local anesthetic, under direct ultrasound visualization, a 14 gauge spring-loaded device was used to perform biopsy of left axillary lymph node using a lateral approach. At the conclusion of the procedure shaped 3 HydroMARK tissue marker clip was deployed into the biopsy cavity Follow up 2 view mammogram was not performed as the patient was sedated and unsafe to stand up. IMPRESSION: Ultrasound guided biopsy of large left breast mass and an abnormal left axillary lymph node. No apparent complications. Electronically Signed: By: Fidela Salisbury M.D. On: 04/06/2020 10:17        ASSESSMENT & PLAN:   Carcinoma of overlapping sites of left breast in female, estrogen receptor negative ([place]) #Left breast cancer T4 N2 M0-stage III triple negative.  I reviewed the pathology and stage with the patient/sister in detail.  Discussed the significant concerns for significant locally advanced disease.  Recommend NGS testing [if BRCA positive can consider PARP inhibitor if poor tolerance to therapy].  Recommend a PET scan.  Will  also need MRI brain.  #Ideal situation would recommend neoadjuvant chemotherapy with carbotaxol Keytruda-Adriamycin Cytoxan.  However I am concerned about potential side effects and poor tolerance to therapy-given her underlying schizophrenia.  As per Dr. Peyton Najjar patient is a poor candidate for upfront surgery.  #I would rather suggest weekly carbotaxol x12; and then proceed with Adriamycin Cytoxan if patient tolerating chemotherapy better.  Patient will also need 2D echo.  We will send antiemetics.  Discussed the potential side effects including but not limited to-increasing fatigue, nausea vomiting, diarrhea, hair loss, sores in the mouth, increase risk of infection and also neuropathy.    # schizophrenia: Follows up with [phone]; Dr.A].  Merrily Pew will reach out to psychiatry to discuss patient's plan of care.  [name] legal guardian; however mother unavailable appointments.  Discussed with Josh.  Will reach out to patient's mother.   Above plan of care was discussed with patient Sister [name] in detail.  Then recommend.   # DISPOSITION: # labs today- cbc/cmp; iron studies/ferritin; thyroid profile-CA-27-29; CEA; Ca 15-3;LDH # PET ASAP # chemo ed ASAP # in 1 week-MD;  labs-cbc/CMP;CA-27-29; CEA; Ca 15-3; carbo-taxol-weekly chemo # in 2 weeks- MD; labs-cbc/CMP; carbo-taxol-weekly- Dr.B  Addendum poor IV access: We will discuss with patient's sister regarding port placement.  If negative then will recommend port placement with Dr. Peyton Najjar.  All questions were answered. The patient knows to call the clinic with any problems, questions or concerns.    Cammie Sickle, MD 04/24/2020 3:29 PM

## 2020-04-24 NOTE — Progress Notes (Signed)
START ON PATHWAY REGIMEN - Breast     Cycles 1 through 4: A cycle is every 21 days:     Pembrolizumab      Paclitaxel      Carboplatin      Filgrastim-xxxx    Cycles 5 through 8: A cycle is every 21 days:     Pembrolizumab      Doxorubicin      Cyclophosphamide      Pegfilgrastim-xxxx   **Always confirm dose/schedule in your pharmacy ordering system**  Patient Characteristics: Preoperative or Nonsurgical Candidate (Clinical Staging), Neoadjuvant Therapy followed by Surgery, Invasive Disease, Chemotherapy, HER2 Negative/Unknown/Equivocal, ER Negative/Unknown, Platinum Therapy Indicated, High-Risk Disease Present Therapeutic Status: Preoperative or Nonsurgical Candidate (Clinical Staging) AJCC M Category: cM0 AJCC Grade: G3 Breast Surgical Plan: Neoadjuvant Therapy followed by Surgery ER Status: Negative (-) AJCC 8 Stage Grouping: IIIC HER2 Status: Negative (-) AJCC T Category: cT4b AJCC N Category: cN2a PR Status: Negative (-) Type of Therapy: Platinum Therapy Indicated Intent of Therapy: Curative Intent, Discussed with Patient 

## 2020-04-27 ENCOUNTER — Telehealth: Payer: Self-pay | Admitting: Internal Medicine

## 2020-04-27 ENCOUNTER — Ambulatory Visit: Payer: Self-pay | Admitting: General Surgery

## 2020-04-27 NOTE — H&P (View-Only) (Signed)
PATIENT PROFILE: Tamara [name] is a 49 y.o. female who presents to the Clinic for consultation at the request of Tamara [name] for evaluation of Port-A-Cath placement.  PCP:  Tamara [name]  HISTORY OF PRESENT ILLNESS: Tamara [name] is a patient with history of schizophrenia that was found with an advanced breast cancer during last admission for agitation.  Patient apparently has had a left breast mass for a long time but due to her schizophrenia he has been very difficult to diagnose and follow-up as outpatient.  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  The patient denies any pain.  There is no radiation.  There is no alleviating or aggravating factor.  The patient today is very cooperative and he agreed mood.  The mother and sister are present during the evaluation.  They understand the severity of the current oncological condition.   PROBLEM LIST: Breast cancer Schizophrenia  GENERAL REVIEW OF SYSTEMS:   General ROS: negative for - chills, fatigue, fever, weight gain or weight loss Allergy and Immunology ROS: negative for - hives  Hematological and Lymphatic ROS: negative for - bleeding problems or bruising, negative for palpable nodes Endocrine ROS: negative for - heat or cold intolerance, hair changes Respiratory ROS: negative for - cough, shortness of breath or wheezing Cardiovascular ROS: no chest pain or palpitations GI ROS: negative for nausea, vomiting, abdominal pain, diarrhea, constipation Musculoskeletal ROS: negative for - joint swelling or muscle pain Neurological ROS: negative for - confusion, syncope Dermatological ROS: negative for pruritus and rash Psychiatric: negative for anxiety, depression, difficulty sleeping and memory loss  MEDICATIONS: Current Outpatient Medications  Medication Sig Dispense Refill   . acetaminophen (TYLENOL) 650 MG suppository Place rectally as needed    . benztropine (COGENTIN) 0.5 MG tablet Take 0.5 mg by mouth 2 (two) times daily    . cetirizine (ZYRTEC) 10 MG tablet Take 1 tablet by mouth once daily    . diphenhydrAMINE (BANOPHEN) 25 mg capsule Take 25 mg by mouth every 6 (six) hours as needed    . divalproex (DEPAKOTE) 500 MG DR tablet Take 1 tablet by mouth every 12 (twelve) hours    . haloperidoL (HALDOL) 5 MG tablet Take 1 tablet by mouth 3 (three) times daily    . haloperidol decanoate (HALDOL DECANOATE) 100 mg/mL injection Inject into the muscle Every 28 days    . hydrOXYzine (ATARAX) 25 MG tablet Take 25 mg by mouth 3 (three) times daily    . levothyroxine (SYNTHROID) 88 MCG tablet Take by mouth once daily    . LORazepam (ATIVAN) 1 MG tablet as needed     No current facility-administered medications for this visit.    ALLERGIES: Patient has no known allergies.  PAST MEDICAL HISTORY: Past Medical History:  Diagnosis Date  . Schizophrenia (CMS-HCC)     PAST SURGICAL HISTORY: History reviewed. No pertinent surgical history.   FAMILY HISTORY: Family History  Problem Relation Age of Onset  . Breast cancer Mother   . Diabetes Mother   . Asthma Mother   . Hyperlipidemia (Elevated cholesterol) Mother   . High blood pressure (Hypertension) Mother   . Anxiety Mother   . Depression Mother   . Prostate cancer Father   . Diabetes Father   . Coronary Artery Disease (Blocked arteries around heart) Father   . Depression Father   .  No Known Problems Sister   . No Known Problems Sister      SOCIAL HISTORY: Social History   Socioeconomic History  . Marital status: Single    Spouse name: Not on file  . Number of children: Not on file  . Years of education: Not on file  . Highest education level: Not on file  Occupational History  . Not on file  Tobacco Use  . Smoking status: Never Smoker  . Smokeless tobacco: Never Used  Vaping Use  . Vaping  Use: Never used  Substance and Sexual Activity  . Alcohol use: Never  . Drug use: Never  . Sexual activity: Not on file  Other Topics Concern  . Not on file  Social History Narrative  . Not on file   Social Determinants of Health   Financial Resource Strain: Not on file  Food Insecurity: Not on file  Transportation Needs: Not on file    PHYSICAL EXAM: Vitals:   04/27/20 0850  BP: 116/68  Pulse: 93   Body mass index is 26.52 kg/m. Weight: 65.8 kg (145 lb)   GENERAL: Alert, active, oriented x3  HEENT: Pupils equal reactive to light. Extraocular movements are intact. Sclera clear. Palpebral conjunctiva normal red color.Pharynx clear.  NECK: Supple with no palpable mass and no adenopathy.  LUNGS: Sound clear with no rales rhonchi or wheezes.  HEART: Regular rhythm S1 and S2 without murmur.  BREAST: The left breast is almost completely replaced by a palpable mass weight pressure changes on the skin.  No ulceration at this moment.  Palpable lymph node on the left axillary area.  There is no palpable mass on the right breast.  There is no axial adenopathy on the right axillary area.  ABDOMEN: Soft and depressible, nontender with no palpable mass, no hepatomegaly.  EXTREMITIES: Well-developed well-nourished symmetrical with no dependent edema.  NEUROLOGICAL: Awake alert oriented, facial expression symmetrical, moving all extremities.  REVIEW OF DATA: I have reviewed the following data today: No visits with results within 3 Month(s) from this visit.  Latest known visit with results is:  No results found for any previous visit.     ASSESSMENT: Tamara [name] is a 49 y.o. female presenting for consultation for Port-A-Cath placement for advanced breast cancer.  Due to patient advanced breast cancer patient was evaluated by medical oncologist and chemotherapy was recommended.  We have all agreed that surgical management at this moment of the left breast cancer is very  complicated.  The family is very supportive to try to start with neoadjuvant chemotherapy to see if the cancer responded and the patient eventually will be able to have less risky surgical management of these advanced left breast cancer.  I discussed with the patient and the patient's mother and sister the implication of having a Chemo-Port.  I discussed with them the risk of surgery that includes pneumothorax, hemothorax, arteriovenous fistula, bleeding, infection, complication of the catheter, among others.  They report understood and agreed to proceed with  Malignant neoplasm of overlapping sites of left breast in female, estrogen receptor negative (CMS-HCC) [C50.812, Z17.1]  PLAN: 1. Insertion of Port a Cath [phone], [id number], [id number]) 2. CBC, CMP  3. Do not take aspirin 5 days before surgery 4. Contact us if has any question or concern.   [name] and sister verbalized understanding, all questions were answered, and were agreeable with the plan outlined above.   Herbert Pun, MD  Electronically signed by Herbert Pun, MD

## 2020-04-27 NOTE — H&P (Signed)
PATIENT PROFILE: Tamara [name] is a 49 y.o. female who presents to the Clinic for consultation at the request of Tamara [name] for evaluation of Port-A-Cath placement.  PCP:  Tamara [name]  HISTORY OF PRESENT ILLNESS: Tamara [name] is a patient with history of schizophrenia that was found with an advanced breast cancer during last admission for agitation.  Patient apparently has had a left breast mass for a long time but due to her schizophrenia he has been very difficult to diagnose and follow-up as outpatient.  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  The patient denies any [name].  There is no radiation.  There is no alleviating or aggravating factor.  The patient today is very cooperative and he agreed mood.  The mother and sister are present during the evaluation.  They understand the severity of the current oncological condition.   PROBLEM LIST: Breast cancer Schizophrenia  GENERAL REVIEW OF SYSTEMS:   General ROS: negative for - chills, fatigue, fever, weight gain or weight loss Allergy and Immunology ROS: negative for - hives  Hematological and Lymphatic ROS: negative for - bleeding problems or bruising, negative for palpable nodes Endocrine ROS: negative for - heat or cold intolerance, hair changes Respiratory ROS: negative for - cough, shortness of breath or wheezing Cardiovascular ROS: no chest [name] or palpitations GI ROS: negative for nausea, vomiting, abdominal [name], diarrhea, constipation Musculoskeletal ROS: negative for - joint swelling or muscle [name] Neurological ROS: negative for - confusion, syncope Dermatological ROS: negative for pruritus and rash Psychiatric: negative for anxiety, depression, difficulty sleeping and memory loss  MEDICATIONS: Current Outpatient Medications  Medication Sig Dispense Refill   . acetaminophen (TYLENOL) 650 MG suppository Place rectally as needed    . benztropine (COGENTIN) 0.5 MG tablet Take 0.5 mg by mouth 2 (two) times daily    . cetirizine (ZYRTEC) 10 MG tablet Take 1 tablet by mouth once daily    . diphenhydrAMINE (BANOPHEN) 25 mg capsule Take 25 mg by mouth every 6 (six) hours as needed    . divalproex (DEPAKOTE) 500 MG DR tablet Take 1 tablet by mouth every 12 (twelve) hours    . haloperidoL (HALDOL) 5 MG tablet Take 1 tablet by mouth 3 (three) times daily    . haloperidol decanoate (HALDOL DECANOATE) 100 mg/mL injection Inject into the muscle Every 28 days    . hydrOXYzine (ATARAX) 25 MG tablet Take 25 mg by mouth 3 (three) times daily    . levothyroxine (SYNTHROID) 88 MCG tablet Take by mouth once daily    . LORazepam (ATIVAN) 1 MG tablet as needed     No current facility-administered medications for this visit.    ALLERGIES: Patient has no known allergies.  PAST MEDICAL HISTORY: Past Medical History:  Diagnosis Date  . Schizophrenia (CMS-HCC)     PAST SURGICAL HISTORY: History reviewed. No pertinent surgical history.   FAMILY HISTORY: Family History  Problem Relation Age of Onset  . Breast cancer Mother   . Diabetes Mother   . Asthma Mother   . Hyperlipidemia (Elevated cholesterol) Mother   . High blood pressure (Hypertension) Mother   . Anxiety Mother   . Depression Mother   . Prostate cancer Father   . Diabetes Father   . Coronary Artery Disease (Blocked arteries around heart) Father   . Depression Father   .  No Known Problems Sister   . No Known Problems Sister      SOCIAL HISTORY: Social History   Socioeconomic History  . Marital status: Single    Spouse name: Not on file  . Number of children: Not on file  . Years of education: Not on file  . Highest education level: Not on file  Occupational History  . Not on file  Tobacco Use  . Smoking status: Never Smoker  . Smokeless tobacco: Never Used  Vaping Use  . Vaping  Use: Never used  Substance and Sexual Activity  . Alcohol use: Never  . Drug use: Never  . Sexual activity: Not on file  Other Topics Concern  . Not on file  Social History Narrative  . Not on file   Social Determinants of Health   Financial Resource Strain: Not on file  Food Insecurity: Not on file  Transportation Needs: Not on file    PHYSICAL EXAM: Vitals:   04/27/20 0850  BP: 116/68  Pulse: 93   Body mass index is 26.52 kg/m. Weight: 65.8 kg (145 lb)   GENERAL: Alert, active, oriented x3  HEENT: Pupils equal reactive to light. Extraocular movements are intact. Sclera clear. Palpebral conjunctiva normal red color.Pharynx clear.  NECK: Supple with no palpable mass and no adenopathy.  LUNGS: Sound clear with no rales rhonchi or wheezes.  HEART: Regular rhythm S1 and S2 without murmur.  BREAST: The left breast is almost completely replaced by a palpable mass weight pressure changes on the skin.  No ulceration at this moment.  Palpable lymph node on the left axillary area.  There is no palpable mass on the right breast.  There is no axial adenopathy on the right axillary area.  ABDOMEN: Soft and depressible, nontender with no palpable mass, no hepatomegaly.  EXTREMITIES: Well-developed well-nourished symmetrical with no dependent edema.  NEUROLOGICAL: Awake alert oriented, facial expression symmetrical, moving all extremities.  REVIEW OF DATA: I have reviewed the following data today: No visits with results within 3 Month(s) from this visit.  Latest known visit with results is:  No results found for any previous visit.     ASSESSMENT: Tamara [name] is a 49 y.o. female presenting for consultation for Port-A-Cath placement for advanced breast cancer.  Due to patient advanced breast cancer patient was evaluated by medical oncologist and chemotherapy was recommended.  We have all agreed that surgical management at this moment of the left breast cancer is very  complicated.  The family is very supportive to try to start with neoadjuvant chemotherapy to see if the cancer responded and the patient eventually will be able to have less risky surgical management of these advanced left breast cancer.  I discussed with the patient and the patient's mother and sister the implication of having a Chemo-Port.  I discussed with them the risk of surgery that includes pneumothorax, hemothorax, arteriovenous fistula, bleeding, infection, complication of the catheter, among others.  They report understood and agreed to proceed with  Malignant neoplasm of overlapping sites of left breast in female, estrogen receptor negative (CMS-HCC) [C50.812, Z17.1]  PLAN: 1. Insertion of Port a Cath [phone], [id number], [id number]) 2. CBC, CMP  3. Do not take aspirin 5 days before surgery 4. Contact us if has any question or concern.   [name] and sister verbalized understanding, all questions were answered, and were agreeable with the plan outlined above.   Herbert Pun, MD  Electronically signed by Herbert Pun, MD

## 2020-04-27 NOTE — Telephone Encounter (Signed)
12/01-left voicemail for the patient's sister Tamara [name] regarding the need for a port placement/given poor IV access. On 12/02-discussed with Dr. Peyton Najjar.

## 2020-04-30 ENCOUNTER — Other Ambulatory Visit
Admission: RE | Admit: 2020-04-30 | Discharge: 2020-04-30 | Disposition: A | Discharge status: Home / Self Care | Payer: Medicaid Other | Source: Ambulatory Visit | Attending: General Surgery | Admitting: General Surgery

## 2020-04-30 ENCOUNTER — Other Ambulatory Visit: Payer: Self-pay

## 2020-04-30 DIAGNOSIS — Z01812 Encounter for preprocedural laboratory examination: Secondary | ICD-10-CM | POA: Diagnosis not present

## 2020-04-30 DIAGNOSIS — Z20822 Contact with and (suspected) exposure to covid-19: Secondary | ICD-10-CM | POA: Insufficient documentation

## 2020-05-01 ENCOUNTER — Inpatient Hospital Stay: Payer: Medicaid Other | Attending: Internal Medicine

## 2020-05-01 ENCOUNTER — Inpatient Hospital Stay (HOSPITAL_BASED_OUTPATIENT_CLINIC_OR_DEPARTMENT_OTHER): Payer: Medicaid Other | Admitting: Nurse Practitioner

## 2020-05-01 DIAGNOSIS — C773 Secondary and unspecified malignant neoplasm of axilla and upper limb lymph nodes: Secondary | ICD-10-CM | POA: Insufficient documentation

## 2020-05-01 DIAGNOSIS — E079 Disorder of thyroid, unspecified: Secondary | ICD-10-CM | POA: Insufficient documentation

## 2020-05-01 DIAGNOSIS — Z171 Estrogen receptor negative status [ER-]: Secondary | ICD-10-CM | POA: Insufficient documentation

## 2020-05-01 DIAGNOSIS — C50812 Malignant neoplasm of overlapping sites of left female breast: Secondary | ICD-10-CM | POA: Insufficient documentation

## 2020-05-01 DIAGNOSIS — Z5111 Encounter for antineoplastic chemotherapy: Secondary | ICD-10-CM | POA: Insufficient documentation

## 2020-05-01 DIAGNOSIS — K219 Gastro-esophageal reflux disease without esophagitis: Secondary | ICD-10-CM | POA: Insufficient documentation

## 2020-05-01 DIAGNOSIS — Z9221 Personal history of antineoplastic chemotherapy: Secondary | ICD-10-CM | POA: Insufficient documentation

## 2020-05-01 DIAGNOSIS — F209 Schizophrenia, unspecified: Secondary | ICD-10-CM | POA: Insufficient documentation

## 2020-05-01 DIAGNOSIS — Z79899 Other long term (current) drug therapy: Secondary | ICD-10-CM | POA: Insufficient documentation

## 2020-05-01 LAB — SARS CORONAVIRUS 2 (TAT 6-24 HRS): SARS Coronavirus 2: NEGATIVE

## 2020-05-01 NOTE — Progress Notes (Signed)
 Virtual Visit Progress Note  CHEMO CARE CLINIC CONSULT NOTE [facility]  [phone] [phone]  Patient Care Team: Physicians, [facility] Faculty as PCP - General   Name of the patient: Tamara [name]  [id number]  02/02/1971   Date of visit: 05/01/20  I connected with Tamara [name] on 05/01/20 at 11:30 AM EST by telephone visit and verified that I am speaking with the correct person using two identifiers.   I discussed the limitations, risks, security and privacy concerns of performing an evaluation and management service by telemedicine and the availability of in-person appointments. I also discussed with the patient that there may be a patient responsible charge related to this service. The patient expressed understanding and agreed to proceed.   Other persons participating in the visit and their role in the encounter: Tamara Davis, RN (Nurse Navigator & Chemo Education)  [name] location: home Provider's location: clinic  Diagnosis-stage III breast cancer  Chief complaint/Reason for visit- Initial Meeting for Chemo Care Clinic, preparing for starting chemotherapy  Heme/Onc history:  Oncology History Overview Note  # NOV 2021- LEFT BREAST CANCER - [facility]; TRIPLE NEGATIVE; Grade of invasive carcinoma: Grade 3; # LYMPH NODE, LEFT AXILLA; ULTRASOUND-GUIDED CORE BIOPSY:  - METASTATIC MAMMARY CARCINOMA, AT LEAST 9 MM METASTASIS. ]  # SURVIVORSHIP:   # GENETICS:   DIAGNOSIS:   STAGE:         ;  GOALS:  CURRENT/MOST RECENT THERAPY :     Carcinoma of overlapping sites of left breast in female, estrogen receptor negative (HCC)  04/24/2020 Initial Diagnosis   Carcinoma of overlapping sites of left breast in female, estrogen receptor negative (HCC)   05/04/2020 -  Chemotherapy   The patient had dexamethasone (DECADRON) 4 MG tablet, 8 mg, Oral, Daily, 0 of 1 cycle, Start date: --, End date: -- palonosetron (ALOXI) injection 0.25 mg, 0.25  mg, Intravenous,  Once, 0 of 12 cycles CARBOplatin (PARAPLATIN) in sodium chloride 0.9 % 100 mL chemo infusion, , Intravenous,  Once, 0 of 12 cycles PACLitaxel (TAXOL) 132 mg in sodium chloride 0.9 % 250 mL chemo infusion (</= 80mg/m2), 80 mg/m2, Intravenous,  Once, 0 of 12 cycles  for chemotherapy treatment.      Interval history-  Tamara [name], 49 year old female, who presents to chemo care clinic today for initial meeting in preparation for starting chemotherapy. I introduced the chemo care clinic and we discussed that the role of the clinic is to assist those who are at an increased risk of emergency room visits and/or complications during the course of chemotherapy treatment. We discussed that the increased risk takes into account factors such as age, performance status, and co-morbidities. We also discussed that for some, this might include barriers to care such as not having a primary care provider, lack of insurance/transportation, or not being able to afford medications. We discussed that the goal of the program is to help prevent unplanned ER visits and help reduce complications during chemotherapy. We do this by discussing specific risk factors to each individual and identifying ways that we can help improve these risk factors and reduce barriers to care.  I attempted to reach patient's legal guardian, her mother but she was unavailable.  Her sister participated in chemo education class and participates in the visit today.  She provides history.   ECOG FS:1 - Symptomatic but completely ambulatory  Review of systems- Review of Systems  Unable to perform ROS: Psychiatric disorder  Constitutional: Negative for   malaise/fatigue and weight loss.  Psychiatric/Behavioral: The patient is nervous/anxious.        Hx of schizophrenia      No Known Allergies  Past Medical History:  Diagnosis Date  . GERD (gastroesophageal reflux disease)   . IUD (intrauterine device) in place IUD was placed  in 2016  . Leiomyoma of uterus   . Schizophrenia (HCC)   . Thyroid disease     No past surgical history on file.  Social History   Socioeconomic History  . Marital status: Single    Spouse name: Not on file  . Number of children: Not on file  . Years of education: Not on file  . Highest education level: Not on file  Occupational History  . Not on file  Tobacco Use  . Smoking status: Never Smoker  . Smokeless tobacco: Never Used  Substance and Sexual Activity  . Alcohol use: No  . Drug use: No  . Sexual activity: Yes    Birth control/protection: Surgical  Other Topics Concern  . Not on file  Social History Narrative  . Not on file   Social Determinants of Health   Financial Resource Strain:   . Difficulty of Paying Living Expenses: Not on file  Food Insecurity:   . Worried About Running Out of Food in the Last Year: Not on file  . Ran Out of Food in the Last Year: Not on file  Transportation Needs:   . Lack of Transportation (Medical): Not on file  . Lack of Transportation (Non-Medical): Not on file  Physical Activity:   . Days of Exercise per Week: Not on file  . Minutes of Exercise per Session: Not on file  Stress:   . Feeling of Stress : Not on file  Social Connections:   . Frequency of Communication with Friends and Family: Not on file  . Frequency of Social Gatherings with Friends and Family: Not on file  . Attends Religious Services: Not on file  . Active Member of Clubs or Organizations: Not on file  . Attends Club or Organization Meetings: Not on file  . Marital Status: Not on file  Intimate Partner Violence:   . Fear of Current or Ex-Partner: Not on file  . Emotionally Abused: Not on file  . Physically Abused: Not on file  . Sexually Abused: Not on file    No family history on file.   Current Outpatient Medications:  .  acetaminophen (TYLENOL) 650 MG suppository, Place 1 suppository (650 mg total) rectally every 6 (six) hours as needed for  fever., Disp: 12 suppository, Rfl: 0 .  BANOPHEN 25 MG capsule, Take 25 mg by mouth every 6 (six) hours as needed for itching., Disp: , Rfl:  .  benztropine (COGENTIN) 0.5 MG tablet, Take 1 tablet (0.5 mg total) by mouth 2 (two) times daily., Disp: 60 tablet, Rfl: 1 .  cetirizine (ZYRTEC) 10 MG tablet, Take 1 tablet (10 mg total) by mouth daily as needed for allergies. (Patient taking differently: Take 10 mg by mouth daily. ), Disp: , Rfl:  .  divalproex (DEPAKOTE) 500 MG DR tablet, Take 1 tablet (500 mg total) by mouth every 12 (twelve) hours., Disp: 60 tablet, Rfl: 1 .  haloperidol (HALDOL) 5 MG tablet, Take 1 tablet (5 mg total) by mouth 3 (three) times daily., Disp: 90 tablet, Rfl: 1 .  haloperidol decanoate (HALDOL DECANOATE) 100 MG/ML injection, Inject 100 mg into the muscle every 28 (twenty-eight) days., Disp: , Rfl:  .    hydrOXYzine (ATARAX/VISTARIL) 25 MG tablet, Take 25 mg by mouth 3 (three) times daily., Disp: , Rfl:  .  levothyroxine (SYNTHROID) 88 MCG tablet, Take 1 tablet (88 mcg total) by mouth daily at 6 (six) AM., Disp: 30 tablet, Rfl: 1 .  MELATONIN PO, Take 1 tablet by mouth at bedtime as needed (sleep)., Disp: , Rfl:  .  ondansetron (ZOFRAN) 8 MG tablet, One pill every 8 hours as needed for nausea/vomitting. (Patient taking differently: Take 8 mg by mouth every 8 (eight) hours as needed for nausea or vomiting. ), Disp: 40 tablet, Rfl: 1 .  prochlorperazine (COMPAZINE) 10 MG tablet, Take 1 tablet (10 mg total) by mouth every 6 (six) hours as needed for nausea or vomiting., Disp: 40 tablet, Rfl: 1  Physical exam: There were no vitals filed for this visit. Physical Exam Constitutional:      Appearance: She is not ill-appearing.  Neurological:     Mental Status: She is alert.  Psychiatric:     Comments: Agitated.       CMP Latest Ref Rng & Units 04/10/2020  Glucose 70 - 99 mg/dL 85  BUN 6 - 20 mg/dL <5(L)  Creatinine 0.44 - 1.00 mg/dL 0.54  Sodium 135 - 145 mmol/L 137   Potassium 3.5 - 5.1 mmol/L 3.6  Chloride 98 - 111 mmol/L 99  CO2 22 - 32 mmol/L 24  Calcium 8.9 - 10.3 mg/dL 9.2  Total Protein 6.5 - 8.1 g/dL -  Total Bilirubin 0.3 - 1.2 mg/dL -  Alkaline Phos 38 - 126 U/L -  AST 15 - 41 U/L -  ALT 0 - 44 U/L -   CBC Latest Ref Rng & Units 04/10/2020  WBC 4.0 - 10.5 K/uL 9.4  Hemoglobin 12.0 - 15.0 g/dL 9.6(L)  Hematocrit 36 - 46 % 28.1(L)  Platelets 150 - 400 K/uL 304    No images are attached to the encounter.  DG Chest 1 View  Result Date: 04/06/2020 CLINICAL DATA:  Possible pneumonia EXAM: CHEST  1 VIEW COMPARISON:  04/04/2020 FINDINGS: Cardiac shadow is within normal limits. The lungs are well aerated bilaterally. No focal infiltrate or effusion is seen. No bony abnormality is noted. IMPRESSION: No acute abnormality seen. Electronically Signed   By: Mark  Lukens M.D.   On: 04/06/2020 16:20   CT CHEST ABDOMEN PELVIS W CONTRAST  Result Date: 04/04/2020 CLINICAL DATA:  Left breast mass identified recently. Evaluate for metastatic disease. EXAM: CT CHEST, ABDOMEN, AND PELVIS WITH CONTRAST TECHNIQUE: Multidetector CT imaging of the chest, abdomen and pelvis was performed following the standard protocol during bolus administration of intravenous contrast. CONTRAST:  100mL OMNIPAQUE IOHEXOL 300 MG/ML  SOLN COMPARISON:  None. FINDINGS: CT CHEST FINDINGS Cardiovascular: The heart size is within normal limits. No pericardial effusion identified. Mediastinum/Nodes: Normal appearance of the thyroid gland. The trachea appears patent and is midline. Normal appearance of the esophagus. Multiple enlarged left axillary lymph nodes are identified. Index node measures 2.1 cm, image 18/4. Adjacent left axillary lymph node measures 2.8 cm, image 22/4. Enlarged left retropectoral lymph node measures 1.5 cm, image 13/4. Prominent left internal mammary lymph nodes are identified. The largest measures 9 mm short axis, image 21/4. Left supraclavicular lymph node measures 1  cm, image 9/4. No enlarged right axillary or supraclavicular lymph nodes. No enlarged mediastinal or hilar lymph nodes. Lungs/Pleura: No pleural effusion. Patchy nonspecific areas of ground-glass attenuation and subsegmental atelectasis noted within the lung bases. No suspicious solid lung nodules identified to suggest pulmonary   metastases. Musculoskeletal: Large left breast mass is noted measuring 8.4 x 6.8 cm, image 24/4. This extends posteriorly to the pectoralis major muscle and extends anteriorly to the skin surface. There is overlying skin thickening noted. Cannot exclude chest wall involvement. CT ABDOMEN PELVIS FINDINGS Hepatobiliary: No focal liver abnormality is seen. No gallstones, gallbladder wall thickening, or biliary dilatation. Pancreas: Unremarkable. No pancreatic ductal dilatation or surrounding inflammatory changes. Spleen: Normal in size without focal abnormality. Adrenals/Urinary Tract: The adrenal glands appear normal. No kidney mass identified bilaterally. Moderate distension of the urinary bladder. No focal bladder abnormality. Stomach/Bowel: Stomach is within normal limits. Appendix appears normal. No evidence of bowel wall thickening, distention, or inflammatory changes. Vascular/Lymphatic: Normal appearance of the abdominal aorta. No abdominopelvic adenopathy. Reproductive: Multiple enhancing pelvic masses are identified which appear closely associated with the uterus. Some of these likely represent uterine fibroids. Within the left pelvis there is a mixed attenuation mass which measures 3.9 x 3.2 by 2.8 cm. This contains a focal area of fat attenuation measuring 1.2 cm, image 78/8. Other: There is no free fluid identified within the abdomen or pelvis. No focal fluid collections identified. Increased soft tissue stranding S is identified within the left upper quadrant peritoneal fat, image 63/4. No discrete peritoneal nodule or mass noted however. Musculoskeletal: No acute or significant  osseous findings. IMPRESSION: 1. Large left breast mass is identified compatible with primary breast carcinoma. This extends posteriorly to the pectoralis major muscle and extends anteriorly to the skin surface. Cannot exclude chest wall involvement. 2. Enlarged left axillary, left retropectoral, and left internal mammary lymph nodes compatible with metastatic adenopathy. 3. Soft tissue stranding within the left upper quadrant of the abdomen is identified. There is also mild soft tissue stranding is in the omentum. No discrete measurable nodule identified. Although not diagnostic for peritoneal disease, given the extent of tumor involvement in the left breast and left axilla early peritoneal disease cannot be excluded. 4. Multiple enhancing pelvic masses are identified which appear closely associated with the uterus. Some of these likely represent uterine fibroids. Within the left pelvis there is a mixed attenuation mass which contains a focal area of fat attenuation. 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 Advise further evaluation with pelvic sonogram for more definitive characterization. Electronically Signed   By: Kerby Moors M.D.   On: 04/04/2020 19:28   US BREAST LTD UNI LEFT INC AXILLA  Result Date: 04/03/2020 CLINICAL DATA:  Patient presents with redness of the left breast and a large left breast mass. The patient was evaluated through the emergency department for evaluation of possible abscess. EXAM: ULTRASOUND OF THE LEFT BREAST COMPARISON:  None. FINDINGS: On physical exam, there is a large mass within the left breast. Targeted ultrasound is performed, showing a 6.3 x 6.2 x 7.1 cm lobular hypoechoic solid mass left breast 1 o'clock position (upper-outer quadrant). Multiple abnormal cortically thickened left axillary lymph nodes are demonstrated measuring up to 2.6 cm with effacement of the fatty hilum. IMPRESSION: 1. Large solid mass left breast 1 o'clock position concerning for  malignancy. 2. Multiple abnormal cortically thickened left axillary lymph nodes concerning for metastatic adenopathy. RECOMMENDATION: 1. Recommend evaluation of the right and left breast with dedicated mammography and ultrasound at a dedicated breast center. This will allow for comprehensive evaluation given the concern of left breast malignancy. 2. Ultimately patient will need at least a biopsy of the large palpable left breast mass and 1 of the left axillary lymph nodes. BI-RADS CATEGORY  5: Highly suggestive of malignancy. These results were called by telephone at the time of interpretation on 04/03/2020 at 4:33 pm to provider Dr. Jessup, who verbally acknowledged these results. Electronically Signed   By: Drew  [name] M.D.   On: 04/03/2020 16:51   US LT BREAST BX W LOC DEV 1ST LESION IMG BX SPEC US GUIDE  Addendum Date: 04/12/2020   ADDENDUM REPORT: 04/12/2020 15:04 ADDENDUM: PATHOLOGY revealed: Site A. LEFT BREAST MASS; ULTRASOUND-GUIDED CORE BIOPSY: - INVASIVE MAMMARY CARCINOMA, NO SPECIAL TYPE, SEE COMMENT. Size of invasive carcinoma: Not assessed, fragmented cores. Grade 3. Ductal carcinoma in situ: Not identified. Lymphovascular invasion: Not identified. Pathology results are CONCORDANT with imaging findings, per Dr. Dobrinka Dimitrova. PATHOLOGY revealed: Site B. LYMPH NODE, LEFT AXILLA; ULTRASOUND-GUIDED CORE BIOPSY: - METASTATIC MAMMARY CARCINOMA, AT LEAST 9 MM METASTASIS. Comment: All of the cores in A and B contain a morphologically poorly differentiated malignant neoplasm, non-small cell type, with prominent cellular dyscohesion. No benign breast tissue is identified in A, and no lymphoid tissue is in B. Pathology results are CONCORDANT with imaging findings, per Dr. Dobrinka Dimitrova. I was unable to speak with patient as she is currently undergoing inpatient hospitalization. Dr. Mary Olney ([facility] pathologist) contacted Dr. Govinda Brahmanday (patient's medical oncologist and provider) with  patient's biopsy results. [name] provider will manage oncological care, and will arrange surgical consultation as indicated. Recommendation: Oncological and surgical consultation. [name] provider (Dr. Govinda Brahmanday) will manage all oncological care and provide surgical consultation as indicated. Pathology results reported by Linda Nash RN on 04/12/2020. Electronically Signed   By: Dobrinka  Dimitrova M.D.   On: 04/12/2020 15:04   Result Date: 04/12/2020 CLINICAL DATA:  Left breast 1 o'clock large mass. EXAM: ULTRASOUND GUIDED LEFT BREAST CORE NEEDLE BIOPSY COMPARISON:  Previous exam(s). PROCEDURE: I met with the patient and we discussed the procedure of ultrasound-guided biopsy, including benefits and alternatives. We discussed the high likelihood of a successful procedure. We discussed the risks of the procedure, including infection, bleeding, tissue injury, clip migration, and inadequate sampling. Informed written consent was given. The usual time-out protocol was performed immediately prior to the procedure. Lesion quadrant: All quadrants of the breast. Using sterile technique and 1% Lidocaine as local anesthetic, under direct ultrasound visualization, a 14 gauge spring-loaded device was used to perform biopsy of left breast 1 o'clock mass using a lateral approach. At the conclusion of the procedure vision tissue marker clip was deployed into the biopsy cavity. Next, using sterile technique and 1% Lidocaine as local anesthetic, under direct ultrasound visualization, a 14 gauge spring-loaded device was used to perform biopsy of left axillary lymph node using a lateral approach. At the conclusion of the procedure shaped 3 HydroMARK tissue marker clip was deployed into the biopsy cavity Follow up 2 view mammogram was not performed as the patient was sedated and unsafe to stand up. IMPRESSION: Ultrasound guided biopsy of large left breast mass and an abnormal left axillary lymph node. No apparent  complications. Electronically Signed: By: Dobrinka  Dimitrova M.D. On: 04/06/2020 10:17   US LT BREAST BX W LOC DEV EA ADD LESION IMG BX SPEC US GUIDE  Addendum Date: 04/12/2020   ADDENDUM REPORT: 04/12/2020 15:04 ADDENDUM: PATHOLOGY revealed: Site A. LEFT BREAST MASS; ULTRASOUND-GUIDED CORE BIOPSY: - INVASIVE MAMMARY CARCINOMA, NO SPECIAL TYPE, SEE COMMENT. Size of invasive carcinoma: Not assessed, fragmented cores. Grade 3. Ductal carcinoma in situ: Not identified. Lymphovascular invasion: Not identified. Pathology results are CONCORDANT with imaging findings, per Dr. Dobrinka Dimitrova. PATHOLOGY   revealed: Site B. LYMPH NODE, LEFT AXILLA; ULTRASOUND-GUIDED CORE BIOPSY: - METASTATIC MAMMARY CARCINOMA, AT LEAST 9 MM METASTASIS. Comment: All of the cores in A and B contain a morphologically poorly differentiated malignant neoplasm, non-small cell type, with prominent cellular dyscohesion. No benign breast tissue is identified in A, and no lymphoid tissue is in B. Pathology results are CONCORDANT with imaging findings, per Dr. Fidela Salisbury. I was unable to speak with patient as she is currently undergoing inpatient hospitalization. Dr. Bryan Lemma [facility] pathologist) contacted Dr. Charlaine Dalton (patient's medical oncologist and provider) with patient's biopsy results. [name] provider will manage oncological care, and will arrange surgical consultation as indicated. Recommendation: Oncological and surgical consultation. [name] provider (Dr. Charlaine Dalton) will manage all oncological care and provide surgical consultation as indicated. Pathology results reported by Electa Sniff RN on 04/12/2020. Electronically Signed   By: Fidela Salisbury M.D.   On: 04/12/2020 15:04   Result Date: 04/12/2020 CLINICAL DATA:  Left breast 1 o'clock large mass. EXAM: ULTRASOUND GUIDED LEFT BREAST CORE NEEDLE BIOPSY COMPARISON:  Previous exam(s). PROCEDURE: I met with the patient and we discussed the procedure  of ultrasound-guided biopsy, including benefits and alternatives. We discussed the high likelihood of a successful procedure. We discussed the risks of the procedure, including infection, bleeding, tissue injury, clip migration, and inadequate sampling. Informed written consent was given. The usual time-out protocol was performed immediately prior to the procedure. Lesion quadrant: All quadrants of the breast. Using sterile technique and 1% Lidocaine as local anesthetic, under direct ultrasound visualization, a 14 gauge spring-loaded device was used to perform biopsy of left breast 1 o'clock mass using a lateral approach. At the conclusion of the procedure vision tissue marker clip was deployed into the biopsy cavity. Next, using sterile technique and 1% Lidocaine as local anesthetic, under direct ultrasound visualization, a 14 gauge spring-loaded device was used to perform biopsy of left axillary lymph node using a lateral approach. At the conclusion of the procedure shaped 3 HydroMARK tissue marker clip was deployed into the biopsy cavity Follow up 2 view mammogram was not performed as the patient was sedated and unsafe to stand up. IMPRESSION: Ultrasound guided biopsy of large left breast mass and an abnormal left axillary lymph node. No apparent complications. Electronically Signed: By: Fidela Salisbury M.D. On: 04/06/2020 10:17     Assessment and plan- Patient is a 49 y.o. female who presents to [facility] for initial meeting in preparation for starting chemotherapy for the treatment of breast cancer.    1. Breast cancer -left breast cancer stage III triple negative.  Patient felt to be a poor surgical candidate based on medical comorbidities.  Awaiting NGS testing results.  May consider neoadjuvant chemotherapy with carbotaxol x 12 then adriamycin and cytoxan based on tolerability.  2. Chemo Care Clinic/High Risk for ER/Hospitalization during chemotherapy- We discussed the role of the chemo  care clinic and identified patient specific risk factors. I discussed that patient was identified as high risk primarily based on: Medical comorbidities, recent hospitalizations.   3. Social Determinants of Health- we discussed that social determinants of health may have significant impacts on health and outcomes for cancer patients.  Today we discussed specific social determinants of performance status, alcohol use, depression, financial needs, food insecurity, housing, interpersonal violence, social connections, stress, tobacco use, and transportation.  After lengthy discussion no specific areas of need were identified though patient's sister will update [place] if needed.    4. Palliative Care-patient has already established  care with palliative care.  Appreciate them following this patient.  We also discussed the role of the Symptom Management Clinic at [facility] for acute issues and methods of contacting clinic/provider. She denies needing specific assistance at this time and She will be followed by Sheena Lambert, RN (Nurse Navigator).   Return to clinic as scheduled. Reviewed plan for port placement and imaging tomorrow.   Visit Diagnosis 1. Carcinoma of overlapping sites of left breast in female, estrogen receptor negative (HCC)      I discussed the assessment and treatment plan with the patient. The patient was provided an opportunity to ask questions and all were answered. The patient agreed with the plan and demonstrated an understanding of the instructions.   The patient was advised to call back or seek an in-person evaluation if the symptoms worsen or if the condition fails to improve as anticipated.   I provided 12 minutes of non face-to-face telephone visit time during this encounter, and > 50% was spent counseling as documented under my assessment & plan.   , DNP, AGNP-[facility] [phone] (clinic)     

## 2020-05-02 ENCOUNTER — Ambulatory Visit: Payer: Medicaid Other

## 2020-05-02 ENCOUNTER — Encounter: Payer: Self-pay | Admitting: General Surgery

## 2020-05-02 ENCOUNTER — Ambulatory Visit
Admission: RE | Admit: 2020-05-02 | Discharge: 2020-05-02 | Disposition: A | Discharge status: Home / Self Care | Payer: Medicaid Other | Attending: General Surgery | Admitting: General Surgery

## 2020-05-02 ENCOUNTER — Telehealth: Payer: Self-pay | Admitting: *Deleted

## 2020-05-02 ENCOUNTER — Encounter
Admission: RE | Disposition: A | Discharge status: Home / Self Care | Payer: Self-pay | Source: Home / Self Care | Attending: General Surgery

## 2020-05-02 ENCOUNTER — Ambulatory Visit: Payer: Medicaid Other | Admitting: Certified Registered"

## 2020-05-02 ENCOUNTER — Other Ambulatory Visit: Payer: Self-pay

## 2020-05-02 ENCOUNTER — Ambulatory Visit
Admission: RE | Admit: 2020-05-02 | Discharge: 2020-05-02 | Disposition: A | Discharge status: Home / Self Care | Payer: Medicaid Other | Source: Ambulatory Visit | Attending: Internal Medicine | Admitting: Internal Medicine

## 2020-05-02 DIAGNOSIS — Z171 Estrogen receptor negative status [ER-]: Secondary | ICD-10-CM

## 2020-05-02 DIAGNOSIS — Z95828 Presence of other vascular implants and grafts: Secondary | ICD-10-CM

## 2020-05-02 DIAGNOSIS — Z79899 Other long term (current) drug therapy: Secondary | ICD-10-CM | POA: Insufficient documentation

## 2020-05-02 DIAGNOSIS — Z79890 Hormone replacement therapy (postmenopausal): Secondary | ICD-10-CM | POA: Insufficient documentation

## 2020-05-02 DIAGNOSIS — C50812 Malignant neoplasm of overlapping sites of left female breast: Secondary | ICD-10-CM | POA: Insufficient documentation

## 2020-05-02 DIAGNOSIS — Z803 Family history of malignant neoplasm of breast: Secondary | ICD-10-CM | POA: Diagnosis not present

## 2020-05-02 DIAGNOSIS — F209 Schizophrenia, unspecified: Secondary | ICD-10-CM | POA: Diagnosis not present

## 2020-05-02 HISTORY — PX: PORTACATH PLACEMENT: SHX2246

## 2020-05-02 LAB — POCT PREGNANCY, URINE: Preg Test, Ur: NEGATIVE

## 2020-05-02 LAB — GLUCOSE, CAPILLARY: Glucose-Capillary: 73 mg/dL (ref 70–99)

## 2020-05-02 SURGERY — INSERTION, TUNNELED CENTRAL VENOUS DEVICE, WITH PORT
Anesthesia: General | Site: Chest | Laterality: Right

## 2020-05-02 MED ORDER — SUGAMMADEX SODIUM 200 MG/2ML IV SOLN
INTRAVENOUS | Status: DC | PRN
  Administered 2020-05-02: 200 mg via INTRAVENOUS

## 2020-05-02 MED ORDER — BUPIVACAINE-EPINEPHRINE (PF) 0.25% -1:200000 IJ SOLN
INTRAMUSCULAR | Status: DC | PRN
  Administered 2020-05-02: 6 mL

## 2020-05-02 MED ORDER — HEPARIN SODIUM (PORCINE) 5000 UNIT/ML IJ SOLN
INTRAMUSCULAR | Status: AC
  Filled 2020-05-02: qty 1

## 2020-05-02 MED ORDER — LIDOCAINE-PRILOCAINE 2.5-2.5 % EX CREA: 1.0000 "application " | TOPICAL_CREAM | CUTANEOUS | 3 refills | Status: DC | PRN

## 2020-05-02 MED ORDER — SODIUM CHLORIDE 0.9 % IV SOLN
INTRAVENOUS | Status: DC | PRN
  Administered 2020-05-02: 9 mL via INTRAMUSCULAR

## 2020-05-02 MED ORDER — PROPOFOL 10 MG/ML IV BOLUS
INTRAVENOUS | Status: AC
  Filled 2020-05-02: qty 20

## 2020-05-02 MED ORDER — ROCURONIUM BROMIDE 100 MG/10ML IV SOLN
INTRAVENOUS | Status: DC | PRN
  Administered 2020-05-02: 40 mg via INTRAVENOUS

## 2020-05-02 MED ORDER — ONDANSETRON HCL 4 MG/2ML IJ SOLN: 4.0000 mg | Freq: Once | INTRAMUSCULAR | Status: DC | PRN

## 2020-05-02 MED ORDER — DEXAMETHASONE SODIUM PHOSPHATE 10 MG/ML IJ SOLN
INTRAMUSCULAR | Status: DC | PRN
  Administered 2020-05-02: 10 mg via INTRAVENOUS

## 2020-05-02 MED ORDER — ACETAMINOPHEN 10 MG/ML IV SOLN
INTRAVENOUS | Status: AC
  Filled 2020-05-02: qty 100

## 2020-05-02 MED ORDER — PROPOFOL 10 MG/ML IV BOLUS
INTRAVENOUS | Status: DC | PRN
  Administered 2020-05-02: 120 mg via INTRAVENOUS

## 2020-05-02 MED ORDER — LACTATED RINGERS IV SOLN: INTRAVENOUS | Status: DC

## 2020-05-02 MED ORDER — ONDANSETRON HCL 4 MG/2ML IJ SOLN
INTRAMUSCULAR | Status: AC
  Filled 2020-05-02: qty 2

## 2020-05-02 MED ORDER — BUPIVACAINE-EPINEPHRINE (PF) 0.25% -1:200000 IJ SOLN
INTRAMUSCULAR | Status: AC
  Filled 2020-05-02: qty 30

## 2020-05-02 MED ORDER — CHLORHEXIDINE GLUCONATE 0.12 % MT SOLN: 15.0000 mL | Freq: Once | OROMUCOSAL | Status: DC

## 2020-05-02 MED ORDER — CEFAZOLIN SODIUM-DEXTROSE 2-4 GM/100ML-% IV SOLN
2.0000 g | INTRAVENOUS | Status: AC
  Administered 2020-05-02: 2 g via INTRAVENOUS

## 2020-05-02 MED ORDER — FENTANYL CITRATE (PF) 100 MCG/2ML IJ SOLN
INTRAMUSCULAR | Status: DC | PRN
  Administered 2020-05-02: 25 ug via INTRAVENOUS
  Administered 2020-05-02: 50 ug via INTRAVENOUS

## 2020-05-02 MED ORDER — ACETAMINOPHEN 10 MG/ML IV SOLN
INTRAVENOUS | Status: DC | PRN
  Administered 2020-05-02: 1000 mg via INTRAVENOUS

## 2020-05-02 MED ORDER — ORAL CARE MOUTH RINSE: 15.0000 mL | Freq: Once | OROMUCOSAL | Status: DC

## 2020-05-02 MED ORDER — HYDROCODONE-ACETAMINOPHEN 5-325 MG PO TABS: 1.0000 | ORAL_TABLET | ORAL | 0 refills | Status: AC | PRN

## 2020-05-02 MED ORDER — ONDANSETRON HCL 4 MG/2ML IJ SOLN
INTRAMUSCULAR | Status: DC | PRN
  Administered 2020-05-02: 4 mg via INTRAVENOUS

## 2020-05-02 MED ORDER — FLUDEOXYGLUCOSE F - 18 (FDG) INJECTION
7.4200 | Freq: Once | INTRAVENOUS | Status: AC | PRN
  Administered 2020-05-02: 7.42 via INTRAVENOUS

## 2020-05-02 MED ORDER — DEXAMETHASONE SODIUM PHOSPHATE 10 MG/ML IJ SOLN
INTRAMUSCULAR | Status: AC
  Filled 2020-05-02: qty 1

## 2020-05-02 MED ORDER — PHENYLEPHRINE HCL (PRESSORS) 10 MG/ML IV SOLN
INTRAVENOUS | Status: DC | PRN
  Administered 2020-05-02 (×2): 100 ug via INTRAVENOUS
  Administered 2020-05-02: 200 ug via INTRAVENOUS

## 2020-05-02 MED ORDER — LIDOCAINE HCL (CARDIAC) PF 100 MG/5ML IV SOSY
PREFILLED_SYRINGE | INTRAVENOUS | Status: DC | PRN
  Administered 2020-05-02: 100 mg via INTRAVENOUS

## 2020-05-02 MED ORDER — FENTANYL CITRATE (PF) 100 MCG/2ML IJ SOLN: 25.0000 ug | INTRAMUSCULAR | Status: DC | PRN

## 2020-05-02 MED ORDER — LIDOCAINE HCL (PF) 2 % IJ SOLN
INTRAMUSCULAR | Status: AC
  Filled 2020-05-02: qty 5

## 2020-05-02 MED ORDER — FENTANYL CITRATE (PF) 100 MCG/2ML IJ SOLN
INTRAMUSCULAR | Status: AC
  Filled 2020-05-02: qty 2

## 2020-05-02 MED ORDER — CEFAZOLIN SODIUM-DEXTROSE 2-4 GM/100ML-% IV SOLN
INTRAVENOUS | Status: AC
  Filled 2020-05-02: qty 100

## 2020-05-02 MED ORDER — SODIUM CHLORIDE (PF) 0.9 % IJ SOLN
INTRAMUSCULAR | Status: AC
  Filled 2020-05-02: qty 50

## 2020-05-02 SURGICAL SUPPLY — 39 items
ADH SKN CLS APL DERMABOND .7 (GAUZE/BANDAGES/DRESSINGS) ×1
APL PRP STRL LF DISP 70% ISPRP (MISCELLANEOUS) ×1
BAG DECANTER FOR FLEXI CONT (MISCELLANEOUS) ×3 IMPLANT
BLADE SURG 15 STRL LF DISP TIS (BLADE) ×1 IMPLANT
BLADE SURG 15 STRL SS (BLADE) ×3
BLADE SURG SZ11 CARB STEEL (BLADE) ×3 IMPLANT
BOOT SUTURE AID YELLOW STND (SUTURE) ×3 IMPLANT
CANISTER SUCT 1200ML W/VALVE (MISCELLANEOUS) IMPLANT
CHLORAPREP W/TINT 26 (MISCELLANEOUS) ×3 IMPLANT
COVER LIGHT HANDLE STERIS (MISCELLANEOUS) ×6 IMPLANT
COVER WAND RF STERILE (DRAPES) ×3 IMPLANT
DERMABOND ADVANCED (GAUZE/BANDAGES/DRESSINGS) ×2
DERMABOND ADVANCED .7 DNX12 (GAUZE/BANDAGES/DRESSINGS) ×1 IMPLANT
DRAPE C-ARM XRAY 36X54 (DRAPES) ×3 IMPLANT
ELECT REM PT RETURN 9FT ADLT (ELECTROSURGICAL) ×3
ELECTRODE REM PT RTRN 9FT ADLT (ELECTROSURGICAL) ×1 IMPLANT
GLOVE BIO SURGEON STRL SZ 6.5 (GLOVE) ×2 IMPLANT
GLOVE BIO SURGEONS STRL SZ 6.5 (GLOVE) ×1
GLOVE BIOGEL PI IND STRL 6.5 (GLOVE) ×1 IMPLANT
GLOVE BIOGEL PI INDICATOR 6.5 (GLOVE) ×2
GOWN STRL REUS W/ TWL LRG LVL3 (GOWN DISPOSABLE) ×3 IMPLANT
GOWN STRL REUS W/TWL LRG LVL3 (GOWN DISPOSABLE) ×9
IV NS 500ML (IV SOLUTION) ×3
IV NS 500ML BAXH (IV SOLUTION) ×1 IMPLANT
KIT PORT POWER 8FR ISP CVUE (Port) ×3 IMPLANT
KIT TURNOVER KIT A (KITS) ×3 IMPLANT
LABEL OR SOLS (LABEL) ×3 IMPLANT
MANIFOLD NEPTUNE II (INSTRUMENTS) ×3 IMPLANT
NEEDLE FILTER BLUNT 18X 1/2SAF (NEEDLE) ×2
NEEDLE FILTER BLUNT 18X1 1/2 (NEEDLE) ×1 IMPLANT
PACK PORT-A-CATH (MISCELLANEOUS) ×3 IMPLANT
SUT MNCRL AB 4-0 PS2 18 (SUTURE) ×3 IMPLANT
SUT PROLENE 2 0 FS (SUTURE) ×3 IMPLANT
SUT VIC AB 2-0 SH 27 (SUTURE) ×3
SUT VIC AB 2-0 SH 27XBRD (SUTURE) ×1 IMPLANT
SUT VIC AB 3-0 SH 27 (SUTURE) ×3
SUT VIC AB 3-0 SH 27X BRD (SUTURE) ×1 IMPLANT
SYR 10ML LL (SYRINGE) ×3 IMPLANT
SYR 3ML LL SCALE MARK (SYRINGE) ×3 IMPLANT

## 2020-05-02 NOTE — Discharge Instructions (Signed)
AMBULATORY SURGERY  DISCHARGE INSTRUCTIONS   1) The drugs that you were given will stay in your system until tomorrow so for the next 24 hours you should not:  A) Drive an automobile B) Make any legal decisions C) Drink any alcoholic beverage   2) You may resume regular meals tomorrow.  Today it is better to start with liquids and gradually work up to solid foods.  You may eat anything you prefer, but it is better to start with liquids, then soup and crackers, and gradually work up to solid foods.   3) Please notify your doctor immediately if you have any unusual bleeding, trouble breathing, redness and pain at the surgery site, drainage, fever, or pain not relieved by medication. 4)   5) Your post-operative visit with Dr.                                     is: Date:                        Time:    Please call to schedule your post-operative visit.  6) Additional Instructions:      Diet: Resume home heart healthy regular diet.   Activity: Increase activity as tolerated. Light activity and walking are encouraged. Do not drive or drink alcohol if taking narcotic pain medications.  Wound care: May shower with soapy water and pat dry (do not rub incisions), but no baths or submerging incision underwater until follow-up. (no swimming)   Medications: Resume all home medications. For mild to moderate pain: acetaminophen (Tylenol) or ibuprofen (if no kidney disease). Combining Tylenol with alcohol can substantially increase your risk of causing liver disease. Narcotic pain medications, if prescribed, can be used for severe pain, though may cause nausea, constipation, and drowsiness. Do not combine Tylenol and Norco within a 6 hour period as Norco contains Tylenol. If you do not need the narcotic pain medication, you do not need to fill the prescription.  Call office [phone]) at any time if any questions, worsening pain, fevers/chills, bleeding, drainage from incision site, or  other concerns.

## 2020-05-02 NOTE — Op Note (Signed)
SURGICAL PROCEDURE REPORT  DATE OF PROCEDURE: 05/02/2020   SURGEON: Dr. Windell Moment   ANESTHESIA: Local with light IV sedation   PRE-OPERATIVE DIAGNOSIS: Advanced breast cancer requiring durable central venous access for chemotherapy   POST-OPERATIVE DIAGNOSIS: Advanced breast cancer requiring durable central venous access for chemotherapy  PROCEDURE(S):  1.) Percutaneous access of Right internal jugular vein under ultrasound guidance 2.) Insertion of tunneled right internal jugular central venous catheter with subcutaneous port  INTRAOPERATIVE FINDINGS: Patent easily compressible Right internal jugular vein with appropriate respiratory variations and well-secured tunneled central venous catheter with subcutaneous port at completion of the procedure  ESTIMATED BLOOD LOSS: Minimal (<20 mL)   SPECIMENS: None   IMPLANTS: 39F tunneled Bard PowerPort central venous catheter with subcutaneous port  DRAINS: None   COMPLICATIONS: None apparent   CONDITION AT COMPLETION: Hemodynamically stable, awake   DISPOSITION: PACU   INDICATION(S) FOR PROCEDURE:  Patient is a 49 y.o. female who presented with advanced breast cancer requiring durable central venous access for chemotherapy. All risks, benefits, and alternatives to above elective procedures were discussed with the patient, who elected to proceed, and informed consent was accordingly obtained at that time.  DETAILS OF PROCEDURE:  Patient was brought to the operative suite and appropriately identified. In Trendelenburg position, Right IJ venous access site was prepped and draped in the usual sterile fashion, and following a brief timeout, percutaneous right IJ venous access was obtained under ultrasound guidance using Seldinger technique, by which local anesthetic was injected over the Right IJ vein, and access needle was inserted under direct ultrasound visualization into the Right IJ vein, through which soft guidewire was advanced,  over which access needle was withdrawn. Guidewire was secured, attention was directed to injection of local anesthetic along the planned tunnel site, 2-3 cm transverse right chest incision was made and confirmed to accommodate the subcutaneous port, and flushed catheter was tunneled retrograde from the port site over the Right chest to the Right IJ access site with the attached port well-secured to the catheter and within the subcutaneous pocket. Insertion sheath was advanced over the guidewire, which was withdrawn along with the insertion sheath dilator. The catheter was introduced through the sheath and left on the [place] junction under fluoro guidance and catheter cut to desire lenght. Catheter connected to port and fixed to the pocket on two side to avoid twisting. Port was confirmed to withdraw blood and flush easily, after which concentrated heparin was instilled into the port and catheter. Dermis at the subcutaneous pocket was re-approximated using buried interrupted 3-0 Vicryl suture, and 4-0 Monocryl suture was used to re-approximate skin at the insertion and subcutaneous port sites in running subcuticular fashion for the subcutaneous port and buried interrupted fashion for the insertion site. Skin was cleaned, dried, and sterile skin glue was applied. Patient was then safely transferred to PACU for a chest x-ray. Ultrasound images are available on paper chart and Fluoroscopy guidance images are available in Epic.

## 2020-05-02 NOTE — Progress Notes (Signed)
Surgical consent obtained verbally from patient, and via phone by sister, Rodman Pickle and Mother, Katharine Look, who has legal guardianship over the pt. Second nurse verified verbal consent over the phone phone with Vassie Loll.

## 2020-05-02 NOTE — Transfer of Care (Signed)
Immediate Anesthesia Transfer of Care Note  Patient: Tamara [name]  Procedure(s) Performed: INSERTION PORT-A-CATH (Right Chest)  Patient Location: PACU  Anesthesia Type:General  Level of Consciousness: awake, alert  and oriented  Airway & Oxygen Therapy: Patient Spontanous Breathing and Patient connected to face mask oxygen  Post-op Assessment: Report given to RN and Post -op Vital signs reviewed and stable  Post vital signs: stable  Last Vitals:  Vitals Value Taken Time  BP 104/58 05/02/20 1410  Temp    Pulse 84 05/02/20 1413  Resp 17 05/02/20 1413  SpO2 100 % 05/02/20 1413  Vitals shown include unvalidated device data.  Last Pain:  Vitals:   05/02/20 1312  TempSrc: Tympanic  PainSc: 0-No pain         Complications: No complications documented.

## 2020-05-02 NOTE — Anesthesia Preprocedure Evaluation (Addendum)
Anesthesia Evaluation  Patient identified by MRN, date of birth, ID band Patient awake    Reviewed: Allergy & Precautions, NPO status , [name] Chart, lab work & pertinent test results  Airway Mallampati: III       Dental  (+) Chipped   Pulmonary neg pulmonary ROS,    Pulmonary exam normal        Cardiovascular negative cardio ROS Normal cardiovascular exam     Neuro/Psych PSYCHIATRIC DISORDERS Schizophrenia negative neurological ROS     GI/Hepatic Neg liver ROS, GERD  ,  Endo/Other  Hypothyroidism   Renal/GU negative Renal ROS  negative genitourinary   Musculoskeletal negative musculoskeletal ROS (+)   Abdominal Normal abdominal exam  (+)   Peds negative pediatric ROS (+)  Hematology negative hematology ROS (+)   Anesthesia Other Findings   Reproductive/Obstetrics                             Anesthesia Physical Anesthesia Plan  ASA: II  Anesthesia Plan: General   Post-op Pain Management:    Induction: Intravenous  PONV Risk Score and Plan:   Airway Management Planned: Oral ETT  Additional Equipment:   Intra-op Plan:   Post-operative Plan: Extubation in OR  Informed Consent: I have reviewed the patients History and Physical, chart, labs and discussed the procedure including the risks, benefits and alternatives for the proposed anesthesia with the patient or authorized representative who has indicated his/her understanding and acceptance.     Dental advisory given  Plan Discussed with: CRNA and Surgeon  Anesthesia Plan Comments: (Talked with sister and informed her of risks.  Sister understands and agrees with proceeding with surgery.)       Anesthesia Quick Evaluation

## 2020-05-02 NOTE — Telephone Encounter (Signed)
[name] sister called requesting a prescription for EMLA cream patient is scheduled for chemotherapy this Friday.

## 2020-05-02 NOTE — Interval H&P Note (Signed)
History and Physical Interval Note:  05/02/2020 12:40 PM  Tamara [name]  has presented today for surgery, with the diagnosis of C50.812 carcinoma of overlapping sites of lt breast in female, estrogen receptor negative.  The various methods of treatment have been discussed with the patient and family. After consideration of risks, benefits and other options for treatment, the patient has consented to  Procedure(s): INSERTION PORT-A-CATH (N/A) as a surgical intervention.  The patient's history has been reviewed, patient examined, no change in status, stable for surgery.  I have reviewed the patient's chart and labs.  Questions were answered to the patient's satisfaction.     Herbert Pun

## 2020-05-02 NOTE — Telephone Encounter (Signed)
Emla prescription sent to patient's pharmacy. Sister made aware.

## 2020-05-02 NOTE — Anesthesia Procedure Notes (Signed)
Procedure Name: Intubation Date/Time: 05/02/2020 1:24 PM Performed by: Natasha Mead, CRNA Pre-anesthesia Checklist: Patient identified, Emergency Drugs available, Suction available and Patient being monitored Patient Re-evaluated:Patient Re-evaluated prior to induction Oxygen Delivery Method: Circle system utilized Preoxygenation: Pre-oxygenation with 100% oxygen Induction Type: IV induction Ventilation: Mask ventilation without difficulty Laryngoscope Size: Miller and 2 Grade View: Grade II Tube type: Oral Tube size: 7.0 mm Number of attempts: 1 Airway Equipment and Method: Stylet and Oral airway Placement Confirmation: ETT inserted through vocal cords under direct vision,  positive ETCO2 and breath sounds checked- equal and bilateral Tube secured with: Tape Dental Injury: Teeth and Oropharynx as per pre-operative assessment

## 2020-05-03 ENCOUNTER — Encounter: Payer: Self-pay | Admitting: Internal Medicine

## 2020-05-03 ENCOUNTER — Telehealth: Payer: Self-pay | Admitting: *Deleted

## 2020-05-03 ENCOUNTER — Other Ambulatory Visit: Payer: Self-pay

## 2020-05-03 NOTE — Telephone Encounter (Signed)
Attempted to reach Tamara [name], patient's legal guardian. Left vm for Tamara [name] to return my phone call. I reached out to Tamara [name], patient's sister, who accompanied patient at the last visit. I asked to her have the legal guardian be present at tomorrow's appointment for the purpose of chemotherapy consents. Pt lives with her mother. Tamara [name] states that she will personally go to [place] and Armiyah's house to let them know that Tamara [name] will need to be present tomorrow. Also, the family has not be able to locate the legal guardianship paperwork. They will need to go to the register of deeds to obtain a copy of these legal documents. They have not had a chance to go to [employer] Of Deeds. I was able to pull up a copy of the guardianship papers. We will scan this document on the chart. I encouraged pt's family to obtain an original document from register of deeds for future reference.

## 2020-05-04 ENCOUNTER — Inpatient Hospital Stay: Payer: Medicaid Other

## 2020-05-04 ENCOUNTER — Inpatient Hospital Stay (HOSPITAL_BASED_OUTPATIENT_CLINIC_OR_DEPARTMENT_OTHER): Payer: Medicaid Other | Admitting: Hospice and Palliative Medicine

## 2020-05-04 ENCOUNTER — Inpatient Hospital Stay (HOSPITAL_BASED_OUTPATIENT_CLINIC_OR_DEPARTMENT_OTHER): Payer: Medicaid Other | Admitting: Internal Medicine

## 2020-05-04 ENCOUNTER — Other Ambulatory Visit: Payer: Self-pay | Admitting: *Deleted

## 2020-05-04 VITALS — BP 111/64 | HR 109 | Temp 98.4°F | Wt 145.0 lb

## 2020-05-04 VITALS — BP 133/75 | HR 96

## 2020-05-04 DIAGNOSIS — C50812 Malignant neoplasm of overlapping sites of left female breast: Secondary | ICD-10-CM

## 2020-05-04 DIAGNOSIS — Z515 Encounter for palliative care: Secondary | ICD-10-CM

## 2020-05-04 DIAGNOSIS — Z171 Estrogen receptor negative status [ER-]: Secondary | ICD-10-CM

## 2020-05-04 DIAGNOSIS — R42 Dizziness and giddiness: Secondary | ICD-10-CM | POA: Diagnosis not present

## 2020-05-04 DIAGNOSIS — Z7189 Other specified counseling: Secondary | ICD-10-CM

## 2020-05-04 DIAGNOSIS — C773 Secondary and unspecified malignant neoplasm of axilla and upper limb lymph nodes: Secondary | ICD-10-CM | POA: Diagnosis not present

## 2020-05-04 DIAGNOSIS — Z5111 Encounter for antineoplastic chemotherapy: Secondary | ICD-10-CM | POA: Diagnosis present

## 2020-05-04 DIAGNOSIS — F209 Schizophrenia, unspecified: Secondary | ICD-10-CM | POA: Diagnosis not present

## 2020-05-04 DIAGNOSIS — Z9221 Personal history of antineoplastic chemotherapy: Secondary | ICD-10-CM | POA: Diagnosis not present

## 2020-05-04 DIAGNOSIS — Z79899 Other long term (current) drug therapy: Secondary | ICD-10-CM | POA: Diagnosis not present

## 2020-05-04 DIAGNOSIS — E079 Disorder of thyroid, unspecified: Secondary | ICD-10-CM | POA: Diagnosis not present

## 2020-05-04 DIAGNOSIS — K219 Gastro-esophageal reflux disease without esophagitis: Secondary | ICD-10-CM | POA: Diagnosis not present

## 2020-05-04 LAB — COMPREHENSIVE METABOLIC PANEL
ALT: 11 U/L (ref 0–44)
AST: 26 U/L (ref 15–41)
Albumin: 3.4 g/dL — ABNORMAL LOW (ref 3.5–5.0)
Alkaline Phosphatase: 51 U/L (ref 38–126)
Anion gap: 13 (ref 5–15)
BUN: 6 mg/dL (ref 6–20)
CO2: 25 mmol/L (ref 22–32)
Calcium: 9.3 mg/dL (ref 8.9–10.3)
Chloride: 93 mmol/L — ABNORMAL LOW (ref 98–111)
Creatinine, Ser: 0.51 mg/dL (ref 0.44–1.00)
GFR, Estimated: >60 mL/min (ref >60)
Glucose, Bld: 101 mg/dL — ABNORMAL HIGH (ref 70–99)
Potassium: 3.3 mmol/L — ABNORMAL LOW (ref 3.5–5.1)
Sodium: 131 mmol/L — ABNORMAL LOW (ref 135–145)
Total Bilirubin: 0.6 mg/dL (ref 0.3–1.2)
Total Protein: 7.3 g/dL (ref 6.5–8.1)

## 2020-05-04 LAB — CBC WITH DIFFERENTIAL/PLATELET
Abs Immature Granulocytes: 0.08 10*3/uL — ABNORMAL HIGH (ref 0.00–0.07)
Basophils Absolute: 0 10*3/uL (ref 0.0–0.1)
Basophils Relative: 0 %
Eosinophils Absolute: 0 10*3/uL (ref 0.0–0.5)
Eosinophils Relative: 0 %
HCT: 33.4 % — ABNORMAL LOW (ref 36.0–46.0)
Hemoglobin: 11.7 g/dL — ABNORMAL LOW (ref 12.0–15.0)
Immature Granulocytes: 1 %
Lymphocytes Relative: 11 %
Lymphs Abs: 1.6 10*3/uL (ref 0.7–4.0)
MCH: 30.6 pg (ref 26.0–34.0)
MCHC: 35 g/dL (ref 30.0–36.0)
MCV: 87.4 fL (ref 80.0–100.0)
Monocytes Absolute: 2.1 10*3/uL — ABNORMAL HIGH (ref 0.1–1.0)
Monocytes Relative: 14 %
Neutro Abs: 11 10*3/uL — ABNORMAL HIGH (ref 1.7–7.7)
Neutrophils Relative %: 74 %
Platelets: 336 10*3/uL (ref 150–400)
RBC: 3.82 MIL/uL — ABNORMAL LOW (ref 3.87–5.11)
RDW: 13 % (ref 11.5–15.5)
WBC: 14.7 10*3/uL — ABNORMAL HIGH (ref 4.0–10.5)
nRBC: 0 % (ref 0.0–0.2)

## 2020-05-04 MED ORDER — HEPARIN SOD (PORK) LOCK FLUSH 100 UNIT/ML IV SOLN
INTRAVENOUS | Status: AC
  Filled 2020-05-04: qty 5

## 2020-05-04 MED ORDER — SODIUM CHLORIDE 0.9 % IV SOLN
Freq: Once | INTRAVENOUS | Status: DC | PRN
  Filled 2020-05-04: qty 250

## 2020-05-04 MED ORDER — SODIUM CHLORIDE 0.9 % IV SOLN
222.4000 mg | Freq: Once | INTRAVENOUS | Status: AC
  Administered 2020-05-04: 220 mg via INTRAVENOUS
  Filled 2020-05-04: qty 22

## 2020-05-04 MED ORDER — HEPARIN SOD (PORK) LOCK FLUSH 100 UNIT/ML IV SOLN
500.0000 [IU] | Freq: Once | INTRAVENOUS | Status: AC
  Administered 2020-05-04: 500 [IU] via INTRAVENOUS
  Filled 2020-05-04: qty 5

## 2020-05-04 MED ORDER — DIPHENHYDRAMINE HCL 50 MG/ML IJ SOLN
50.0000 mg | Freq: Once | INTRAMUSCULAR | Status: AC
  Administered 2020-05-04: 50 mg via INTRAVENOUS
  Filled 2020-05-04: qty 1

## 2020-05-04 MED ORDER — SODIUM CHLORIDE 0.9% FLUSH
10.0000 mL | Freq: Once | INTRAVENOUS | Status: AC
  Administered 2020-05-04: 10 mL via INTRAVENOUS
  Filled 2020-05-04: qty 10

## 2020-05-04 MED ORDER — FAMOTIDINE IN NACL 20-0.9 MG/50ML-% IV SOLN
20.0000 mg | Freq: Once | INTRAVENOUS | Status: AC
  Administered 2020-05-04: 20 mg via INTRAVENOUS
  Filled 2020-05-04: qty 50

## 2020-05-04 MED ORDER — SODIUM CHLORIDE 0.9 % IV SOLN
Freq: Once | INTRAVENOUS | Status: AC
  Filled 2020-05-04: qty 250

## 2020-05-04 MED ORDER — ONDANSETRON HCL 8 MG PO TABS
ORAL_TABLET | ORAL | 1 refills | Status: DC
Start: 2020-05-04 — End: 2020-09-04

## 2020-05-04 MED ORDER — METHYLPREDNISOLONE SODIUM SUCC 125 MG IJ SOLR
125.0000 mg | Freq: Once | INTRAMUSCULAR | Status: AC | PRN
  Administered 2020-05-04: 125 mg via INTRAVENOUS

## 2020-05-04 MED ORDER — SODIUM CHLORIDE 0.9 % IV SOLN
80.0000 mg/m2 | Freq: Once | INTRAVENOUS | Status: AC
  Administered 2020-05-04: 132 mg via INTRAVENOUS
  Filled 2020-05-04: qty 22

## 2020-05-04 MED ORDER — SODIUM CHLORIDE 0.9 % IV SOLN
20.0000 mg | Freq: Once | INTRAVENOUS | Status: AC
  Administered 2020-05-04: 20 mg via INTRAVENOUS
  Filled 2020-05-04: qty 20

## 2020-05-04 MED ORDER — PALONOSETRON HCL INJECTION 0.25 MG/5ML
0.2500 mg | Freq: Once | INTRAVENOUS | Status: AC
  Administered 2020-05-04: 0.25 mg via INTRAVENOUS
  Filled 2020-05-04: qty 5

## 2020-05-04 MED ORDER — PROCHLORPERAZINE MALEATE 10 MG PO TABS: 10.0000 mg | ORAL_TABLET | Freq: Four times a day (QID) | ORAL | 1 refills | Status: DC | PRN

## 2020-05-04 NOTE — Progress Notes (Signed)
ON [facility] - Breast  No Change  Continue With Treatment as Ordered.  Original Decision Date/Time: 04/24/2020 15:09     Cycles 1 through 4: A cycle is every 21 days:     Pembrolizumab      Paclitaxel      Carboplatin      Filgrastim-xxxx    Cycles 5 through 8: A cycle is every 21 days:     Pembrolizumab      Doxorubicin      Cyclophosphamide      Pegfilgrastim-xxxx   **Always confirm dose/schedule in your pharmacy ordering system**  Patient Characteristics: Preoperative or Nonsurgical Candidate (Clinical Staging), Neoadjuvant Therapy followed by Surgery, Invasive Disease, Chemotherapy, HER2 Negative/Unknown/Equivocal, ER Negative/Unknown, Platinum Therapy Indicated, High-Risk Disease Present Therapeutic Status: Preoperative or Nonsurgical Candidate (Clinical Staging) AJCC M Category: cM0 AJCC Grade: G3 Breast Surgical Plan: Neoadjuvant Therapy followed by Surgery ER Status: Negative (-) AJCC 8 Stage Grouping: IIIC HER2 Status: Negative (-) AJCC T Category: cT4b AJCC N Category: cN2a PR Status: Negative (-) Type of Therapy: Platinum Therapy Indicated Intent of Therapy: Curative Intent, Discussed with Patient

## 2020-05-04 NOTE — Progress Notes (Signed)
HR 109. Per Dr Rogue Bussing okay to proceed with treatment  Taxol started at 1150 at 56ml/hr. 1200 patient coughing, flushed, "I need something to drink, I feel something in my throat" Taxol stopped VS obtained. 125mg  Solu medrol given and one liter IVF started at 999. Rulon Abide NP chairside at [phone]. O2 sats stable.   1206 BP dropped to 74/61, sats 100 on room air, HR 99. Dr Rogue Bussing aware and orders to continue IVF.  1212 80/47   1215 103/61 HR 100  1230 119/66 HR 100 Sats 100. Patient states feeling better and all symptoms resolved. Dr Rogue Bussing to see patient. Orders to not restart taxol, finish 1 liter bag of IVF and proceed with carboplatin only.   1345-Tolerated Carboplatin well. Discharged home with mother and sister.

## 2020-05-04 NOTE — Progress Notes (Signed)
Tamara [name]  [phone] [phone]   Name: Tamara [name] Date: 05/04/2020 MRN: [id number]  DOB: [birth year]  Patient Care Team: Physicians, [place] as PCP - General    REASON FOR CONSULTATION: Tamara [name] is a 49 y.o. female with multiple medical problems including including schizophrenia, intellectual disability, who was hospitalized 04/03/2020-11/60/21 for worsening agitation and was IVC due to acute psychosis.  Patient was found to have a large left breast mass with biopsy on 11/12 confirming invasive breast cancer.   CT of the chest/abdomen/pelvis on 11/10 showed the breast mass extending into pectoralis major muscle and possibly invading the chest wall. She also had several lymph nodes concerning for metastatic adenopathy. Treatment options were felt to be limited in part due to her schizophrenia, agitation, and poor insight. Palliative care was consulted to address goals..   SOCIAL HISTORY:     reports that she has never smoked. She has never used smokeless tobacco. She reports that she does not drink alcohol and does not use drugs.  Patient is unmarried.  She has no children.  She lives at home with her mother and father. She has two sisters who are involved in her care.   ADVANCE DIRECTIVES:  Reportedly patient's mother is her legal guardian  CODE STATUS:   PAST MEDICAL HISTORY: Past Medical History:  Diagnosis Date  . GERD (gastroesophageal reflux disease)   . IUD (intrauterine device) in place IUD was placed in 2016  . Leiomyoma of uterus   . Schizophrenia ([place])   . Thyroid disease     PAST SURGICAL HISTORY:  Past Surgical History:  Procedure Laterality Date  . PORTACATH PLACEMENT Right 05/02/2020   Procedure: INSERTION PORT-A-CATH;  Surgeon: Tamara Pun, MD;  Location: [facility] ORS;  Service: General;  Laterality: Right;    HEMATOLOGY/ONCOLOGY HISTORY:  Oncology History Overview  Note  # NOV 2021- LEFT BREAST CANCER - [place]; TRIPLE NEGATIVE; Grade of invasive carcinoma: Grade 3; # LYMPH NODE, LEFT AXILLA; ULTRASOUND-GUIDED CORE BIOPSY:  - METASTATIC MAMMARY CARCINOMA, AT LEAST 9 MM METASTASIS. ]  # SURVIVORSHIP:   # GENETICS:   DIAGNOSIS:   STAGE:         ;  GOALS:  CURRENT/MOST RECENT THERAPY :     Carcinoma of overlapping sites of left breast in female, estrogen receptor negative ([place])  04/24/2020 Initial Diagnosis   Carcinoma of overlapping sites of left breast in female, estrogen receptor negative ([place])   05/04/2020 -  Chemotherapy   The patient had dexamethasone (DECADRON) 4 MG tablet, 8 mg, Oral, Daily, 1 of 1 cycle, Start date: --, End date: -- palonosetron (ALOXI) injection 0.25 mg, 0.25 mg, Intravenous,  Once, 1 of 12 cycles Administration: 0.25 mg (05/04/2020) CARBOplatin (PARAPLATIN) 220 mg in sodium chloride 0.9 % 250 mL chemo infusion, 220 mg (100 % of original dose 222.4 mg), Intravenous,  Once, 1 of 12 cycles Dose modification:   (original dose 222.4 mg, Cycle 1) PACLitaxel (TAXOL) 132 mg in sodium chloride 0.9 % 250 mL chemo infusion (</= 71m/m2), 80 mg/m2 = 132 mg, Intravenous,  Once, 1 of 12 cycles Administration: 132 mg (05/04/2020)  for chemotherapy treatment.      ALLERGIES:  has No Known Allergies.  MEDICATIONS:  Current Outpatient Medications  Medication Sig Dispense Refill  . acetaminophen (TYLENOL) 650 MG suppository Place 1 suppository (650 mg total) rectally every 6 (six) hours as needed for fever. 12 suppository 0  . BANOPHEN 25 MG  capsule Take 25 mg by mouth every 6 (six) hours as needed for itching.    . benztropine (COGENTIN) 0.5 MG tablet Take 1 tablet (0.5 mg total) by mouth 2 (two) times daily. 60 tablet 1  . cetirizine (ZYRTEC) 10 MG tablet Take 1 tablet (10 mg total) by mouth daily as needed for allergies. (Patient taking differently: Take 10 mg by mouth daily.)    . divalproex (DEPAKOTE) 500 MG DR tablet Take 1  tablet (500 mg total) by mouth every 12 (twelve) hours. 60 tablet 1  . haloperidol (HALDOL) 5 MG tablet Take 1 tablet (5 mg total) by mouth 3 (three) times daily. 90 tablet 1  . haloperidol decanoate (HALDOL DECANOATE) 100 MG/ML injection Inject 100 mg into the muscle every 28 (twenty-eight) days. (Patient not taking: Reported on 05/03/2020)    . HYDROcodone-acetaminophen (NORCO) 5-325 MG tablet Take 1 tablet by mouth every 4 (four) hours as needed for up to 3 days for moderate pain. 16 tablet 0  . hydrOXYzine (ATARAX/VISTARIL) 25 MG tablet Take 25 mg by mouth 3 (three) times daily.    Marland Kitchen levothyroxine (SYNTHROID) 88 MCG tablet Take 1 tablet (88 mcg total) by mouth daily at 6 (six) AM. 30 tablet 1  . lidocaine-prilocaine (EMLA) cream Apply 1 application topically as needed. (Patient not taking: No sig reported) 30 g 3  . MELATONIN PO Take 1 tablet by mouth at bedtime as needed (sleep).    . ondansetron (ZOFRAN) 8 MG tablet One pill every 8 hours as needed for nausea/vomitting. 40 tablet 1  . prochlorperazine (COMPAZINE) 10 MG tablet Take 1 tablet (10 mg total) by mouth every 6 (six) hours as needed for nausea or vomiting. 40 tablet 1   No current facility-administered medications for this visit.   Facility-Administered Medications Ordered in Other Visits  Medication Dose Route Frequency Provider Last Rate Last Admin  . 0.9 %  sodium chloride infusion   Intravenous Once PRN Tamara Sickle, MD   Stopped at 05/04/20 1305  . heparin lock flush 100 unit/mL  500 Units Intravenous Once Tamara Sickle, MD        VITAL SIGNS: LMP  (LMP Unknown)  There were no vitals filed for this visit.  Estimated body mass index is 27.4 kg/m as calculated from the following:   Height as of 05/02/20: 5' 1"  (1.549 m).   Weight as of an earlier encounter on 05/04/20: 145 lb (65.8 kg).  LABS: CBC:    Component Value Date/Time   WBC 14.7 (H) 05/04/2020 0935   HGB 11.7 (L) 05/04/2020 0935   HGB 13.4  11/11/2013 2142   HCT 33.4 (L) 05/04/2020 0935   HCT 39.3 11/11/2013 2142   PLT 336 05/04/2020 0935   PLT 295 11/11/2013 2142   MCV 87.4 05/04/2020 0935   MCV 90 11/11/2013 2142   NEUTROABS 11.0 (H) 05/04/2020 0935   LYMPHSABS 1.6 05/04/2020 0935   MONOABS 2.1 (H) 05/04/2020 0935   EOSABS 0.0 05/04/2020 0935   BASOSABS 0.0 05/04/2020 0935   Comprehensive Metabolic Panel:    Component Value Date/Time   NA 131 (L) 05/04/2020 0935   NA 135 (L) 11/14/2013 1309   K 3.3 (L) 05/04/2020 0935   K 3.9 11/14/2013 1309   CL 93 (L) 05/04/2020 0935   CL 97 (L) 11/14/2013 1309   CO2 25 05/04/2020 0935   CO2 29 11/14/2013 1309   BUN 6 05/04/2020 0935   BUN 3 (L) 11/14/2013 1309   CREATININE 0.51  05/04/2020 0935   CREATININE 0.88 11/14/2013 1309   GLUCOSE 101 (H) 05/04/2020 0935   GLUCOSE 117 (H) 11/14/2013 1309   CALCIUM 9.3 05/04/2020 0935   CALCIUM 9.6 11/14/2013 1309   AST 26 05/04/2020 0935   AST 25 11/11/2013 2142   ALT 11 05/04/2020 0935   ALT 18 11/11/2013 2142   ALKPHOS 51 05/04/2020 0935   ALKPHOS 111 11/11/2013 2142   BILITOT 0.6 05/04/2020 0935   BILITOT 0.3 11/11/2013 2142   PROT 7.3 05/04/2020 0935   PROT 8.1 11/11/2013 2142   ALBUMIN 3.4 (L) 05/04/2020 0935   ALBUMIN 3.8 11/11/2013 2142    RADIOGRAPHIC STUDIES: DG Chest 1 View  Result Date: 04/06/2020 CLINICAL DATA:  Possible pneumonia EXAM: CHEST  1 VIEW COMPARISON:  04/04/2020 FINDINGS: Cardiac shadow is within normal limits. The lungs are well aerated bilaterally. No focal infiltrate or effusion is seen. No bony abnormality is noted. IMPRESSION: No acute abnormality seen. Electronically Signed   By: Inez Catalina M.D.   On: 04/06/2020 16:20   CT CHEST ABDOMEN PELVIS W CONTRAST  Result Date: 04/04/2020 CLINICAL DATA:  Left breast mass identified recently. Evaluate for metastatic disease. EXAM: CT CHEST, ABDOMEN, AND PELVIS WITH CONTRAST TECHNIQUE: Multidetector CT imaging of the chest, abdomen and pelvis was  performed following the standard protocol during bolus administration of intravenous contrast. CONTRAST:  170m OMNIPAQUE IOHEXOL 300 MG/ML  SOLN COMPARISON:  None. FINDINGS: CT CHEST FINDINGS Cardiovascular: The heart size is within normal limits. No pericardial effusion identified. Mediastinum/Nodes: Normal appearance of the thyroid gland. The trachea appears patent and is midline. Normal appearance of the esophagus. Multiple enlarged left axillary lymph nodes are identified. Index node measures 2.1 cm, image 18/4. Adjacent left axillary lymph node measures 2.8 cm, image 22/4. Enlarged left retropectoral lymph node measures 1.5 cm, image 13/4. Prominent left internal mammary lymph nodes are identified. The largest measures 9 mm short axis, image 21/4. Left supraclavicular lymph node measures 1 cm, image 9/4. No enlarged right axillary or supraclavicular lymph nodes. No enlarged mediastinal or hilar lymph nodes. Lungs/Pleura: No pleural effusion. Patchy nonspecific areas of ground-glass attenuation and subsegmental atelectasis noted within the lung bases. No suspicious solid lung nodules identified to suggest pulmonary metastases. Musculoskeletal: Large left breast mass is noted measuring 8.4 x 6.8 cm, image 24/4. This extends posteriorly to the pectoralis major muscle and extends anteriorly to the skin surface. There is overlying skin thickening noted. Cannot exclude chest wall involvement. CT ABDOMEN PELVIS FINDINGS Hepatobiliary: No focal liver abnormality is seen. No gallstones, gallbladder wall thickening, or biliary dilatation. Pancreas: Unremarkable. No pancreatic ductal dilatation or surrounding inflammatory changes. Spleen: Normal in size without focal abnormality. Adrenals/Urinary Tract: The adrenal glands appear normal. No kidney mass identified bilaterally. Moderate distension of the urinary bladder. No focal bladder abnormality. Stomach/Bowel: Stomach is within normal limits. Appendix appears normal.  No evidence of bowel wall thickening, distention, or inflammatory changes. Vascular/Lymphatic: Normal appearance of the abdominal aorta. No abdominopelvic adenopathy. Reproductive: Multiple enhancing pelvic masses are identified which appear closely associated with the uterus. Some of these likely represent uterine fibroids. Within the left pelvis there is a mixed attenuation mass which measures 3.9 x 3.2 by 2.8 cm. This contains a focal area of fat attenuation measuring 1.2 cm, image 78/8. Other: There is no free fluid identified within the abdomen or pelvis. No focal fluid collections identified. Increased soft tissue stranding S is identified within the left upper quadrant peritoneal fat, image 63/4. No discrete peritoneal nodule  or mass noted however. Musculoskeletal: No acute or significant osseous findings. IMPRESSION: 1. Large left breast mass is identified compatible with primary breast carcinoma. This extends posteriorly to the pectoralis major muscle and extends anteriorly to the skin surface. Cannot exclude chest wall involvement. 2. Enlarged left axillary, left retropectoral, and left internal mammary lymph nodes compatible with metastatic adenopathy. 3. Soft tissue stranding within the left upper quadrant of the abdomen is identified. There is also mild soft tissue stranding is in the omentum. No discrete measurable nodule identified. Although not diagnostic for peritoneal disease, given the extent of tumor involvement in the left breast and left axilla early peritoneal disease cannot be excluded. 4. Multiple enhancing pelvic masses are identified which appear closely associated with the uterus. Some of these likely represent uterine fibroids. Within the left pelvis there is a mixed attenuation mass which contains a focal area of fat attenuation. 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 Advise further evaluation with pelvic sonogram for more definitive characterization. Electronically  Signed   By: Kerby Moors M.D.   On: 04/04/2020 19:28   NM PET Image Initial (PI) Skull Base To Thigh  Result Date: 05/03/2020 CLINICAL DATA:  Initial treatment strategy for new diagnosis of left breast cancer. EXAM: NUCLEAR MEDICINE PET SKULL BASE TO THIGH TECHNIQUE: 7.4 mCi F-18 FDG was injected intravenously. Full-ring PET imaging was performed from the skull base to thigh after the radiotracer. CT data was obtained and used for attenuation correction and anatomic localization. Fasting blood glucose: 73 mg/dl COMPARISON:  04/04/2020 CT chest, abdomen and pelvis. FINDINGS: Mediastinal blood pool activity: SUV max 1.6 Liver activity: SUV max NA NECK: No hypermetabolic neck lymph nodes. Mildly enlarged 1.0 cm left supraclavicular neck node (series 3/image 60) demonstrates no hypermetabolism. Incidental CT findings: none CHEST: Intensely hypermetabolic large 19.3 x 8.4 cm left breast mass with max SUV 66.8 (series 3/image 88), which demonstrates cutaneous involvement superficially and left pectoralis muscle involvement posteriorly. Multiple hypermetabolic satellite soft tissue nodules throughout the left breast, for example a 1.8 cm outer lower cutaneous left breast lesion with max SUV 35.8 (series 3/image 103). Numerous hypermetabolic bulky left axillary and left retropectoral lymph nodes. Representative 3.1 cm short axis diameter left axillary node with max SUV 34.6 (series 3/image 72). Representative 1.8 cm left retropectoral node with max SUV 17.3 (series 3/image 65). No enlarged or hypermetabolic right axillary nodes. No hypermetabolic mediastinal or hilar lymph nodes. Previously questioned enlarged left internal mammary lymph node demonstrates no hypermetabolism. No hypermetabolic pulmonary findings. Incidental CT findings: Mild centrilobular and paraseptal emphysema. Mild patchy ground-glass opacities in the peripheral lower lobes bilaterally, not appreciably changed. No significant pulmonary nodules.  ABDOMEN/PELVIS: No abnormal hypermetabolic activity within the liver, pancreas, adrenal glands, or spleen. No hypermetabolic lymph nodes in the abdomen or pelvis. Incidental CT findings: Mildly enlarged myomatous uterus, with one of the uterine fibroids demonstrating calcific degeneration. SKELETON: No focal hypermetabolic activity to suggest skeletal metastasis. Incidental CT findings: none IMPRESSION: 1. Intensely hypermetabolic 79.0 cm left breast mass compatible with primary left breast malignancy, with left pectoralis muscle involvement posteriorly and cutaneous involvement by the mass. Multiple smaller hypermetabolic satellite malignant nodules scattered throughout the left breast. 2. Bulky hypermetabolic left axillary and left retropectoral nodal metastases. 3. No additional sites of hypermetabolic metastatic disease. 4. Nonspecific mild patchy ground-glass opacities in the peripheral lower lobes, unchanged, with a broad differential including nonspecific postinflammatory scarring. Continued chest CT surveillance warranted. 5. Chronic findings include: Emphysema (ICD10-J43.9). Mildly enlarged myomatous  uterus. Electronically Signed   By: Ilona Sorrel M.D.   On: 05/03/2020 10:57   DG Chest Port 1 View  Result Date: 05/02/2020 CLINICAL DATA:  Port-A-Cath placement, breast cancer EXAM: PORTABLE CHEST 1 VIEW COMPARISON:  04/06/2020 chest radiograph. FINDINGS: Right internal jugular Port-A-Cath terminates in the middle third of the SVC. Stable cardiomediastinal silhouette with normal heart size. No pneumothorax. No pleural effusion. Lungs appear clear, with no acute consolidative airspace disease and no pulmonary edema. IMPRESSION: Right internal jugular Port-A-Cath terminates in the middle third of the SVC. No pneumothorax. No active cardiopulmonary disease. Electronically Signed   By: Ilona Sorrel M.D.   On: 05/02/2020 14:43   DG C-Arm 1-60 Min-No Report  Result Date: 05/02/2020 Fluoroscopy was utilized  by the requesting physician.  No radiographic interpretation.   [place] LT BREAST BX W LOC DEV 1ST LESION IMG BX SPEC US GUIDE  Addendum Date: 04/12/2020   ADDENDUM REPORT: 04/12/2020 15:04 ADDENDUM: PATHOLOGY revealed: Site A. LEFT BREAST MASS; ULTRASOUND-GUIDED CORE BIOPSY: - INVASIVE MAMMARY CARCINOMA, NO SPECIAL TYPE, SEE COMMENT. Size of invasive carcinoma: Not assessed, fragmented cores. Grade 3. Ductal carcinoma in situ: Not identified. Lymphovascular invasion: Not identified. Pathology results are CONCORDANT with imaging findings, per Dr. Fidela Salisbury. PATHOLOGY revealed: Site B. LYMPH NODE, LEFT AXILLA; ULTRASOUND-GUIDED CORE BIOPSY: - METASTATIC MAMMARY CARCINOMA, AT LEAST 9 MM METASTASIS. Comment: All of the cores in A and B contain a morphologically poorly differentiated malignant neoplasm, non-small cell type, with prominent cellular dyscohesion. No benign breast tissue is identified in A, and no lymphoid tissue is in B. Pathology results are CONCORDANT with imaging findings, per Dr. Fidela Salisbury. I was unable to speak with patient as she is currently undergoing inpatient hospitalization. Dr. Bryan Lemma [facility] pathologist) contacted Dr. Charlaine Dalton (patient's medical oncologist and provider) with patient's biopsy results. [name] provider will manage oncological care, and will arrange surgical consultation as indicated. Recommendation: Oncological and surgical consultation. [name] provider (Dr. Charlaine Dalton) will manage all oncological care and provide surgical consultation as indicated. Pathology results reported by Electa Sniff RN on 04/12/2020. Electronically Signed   By: Fidela Salisbury M.D.   On: 04/12/2020 15:04   Result Date: 04/12/2020 CLINICAL DATA:  Left breast 1 o'clock large mass. EXAM: ULTRASOUND GUIDED LEFT BREAST CORE NEEDLE BIOPSY COMPARISON:  Previous exam(s). PROCEDURE: I met with the patient and we discussed the procedure of ultrasound-guided biopsy,  including benefits and alternatives. We discussed the high likelihood of a successful procedure. We discussed the risks of the procedure, including infection, bleeding, tissue injury, clip migration, and inadequate sampling. Informed written consent was given. The usual time-out protocol was performed immediately prior to the procedure. Lesion quadrant: All quadrants of the breast. Using sterile technique and 1% Lidocaine as local anesthetic, under direct ultrasound visualization, a 14 gauge spring-loaded device was used to perform biopsy of left breast 1 o'clock mass using a lateral approach. At the conclusion of the procedure vision tissue marker clip was deployed into the biopsy cavity. Next, using sterile technique and 1% Lidocaine as local anesthetic, under direct ultrasound visualization, a 14 gauge spring-loaded device was used to perform biopsy of left axillary lymph node using a lateral approach. At the conclusion of the procedure shaped 3 HydroMARK tissue marker clip was deployed into the biopsy cavity Follow up 2 view mammogram was not performed as the patient was sedated and unsafe to stand up. IMPRESSION: Ultrasound guided biopsy of large left breast mass and an abnormal left axillary lymph node.  No apparent complications. Electronically Signed: By: Fidela Salisbury M.D. On: 04/06/2020 10:17   [place] LT BREAST BX W LOC DEV EA ADD LESION IMG BX SPEC US GUIDE  Addendum Date: 04/12/2020   ADDENDUM REPORT: 04/12/2020 15:04 ADDENDUM: PATHOLOGY revealed: Site A. LEFT BREAST MASS; ULTRASOUND-GUIDED CORE BIOPSY: - INVASIVE MAMMARY CARCINOMA, NO SPECIAL TYPE, SEE COMMENT. Size of invasive carcinoma: Not assessed, fragmented cores. Grade 3. Ductal carcinoma in situ: Not identified. Lymphovascular invasion: Not identified. Pathology results are CONCORDANT with imaging findings, per Dr. Fidela Salisbury. PATHOLOGY revealed: Site B. LYMPH NODE, LEFT AXILLA; ULTRASOUND-GUIDED CORE BIOPSY: - METASTATIC MAMMARY  CARCINOMA, AT LEAST 9 MM METASTASIS. Comment: All of the cores in A and B contain a morphologically poorly differentiated malignant neoplasm, non-small cell type, with prominent cellular dyscohesion. No benign breast tissue is identified in A, and no lymphoid tissue is in B. Pathology results are CONCORDANT with imaging findings, per Dr. Fidela Salisbury. I was unable to speak with patient as she is currently undergoing inpatient hospitalization. Dr. Bryan Lemma [facility] pathologist) contacted Dr. Charlaine Dalton (patient's medical oncologist and provider) with patient's biopsy results. [name] provider will manage oncological care, and will arrange surgical consultation as indicated. Recommendation: Oncological and surgical consultation. [name] provider (Dr. Charlaine Dalton) will manage all oncological care and provide surgical consultation as indicated. Pathology results reported by Electa Sniff RN on 04/12/2020. Electronically Signed   By: Fidela Salisbury M.D.   On: 04/12/2020 15:04   Result Date: 04/12/2020 CLINICAL DATA:  Left breast 1 o'clock large mass. EXAM: ULTRASOUND GUIDED LEFT BREAST CORE NEEDLE BIOPSY COMPARISON:  Previous exam(s). PROCEDURE: I met with the patient and we discussed the procedure of ultrasound-guided biopsy, including benefits and alternatives. We discussed the high likelihood of a successful procedure. We discussed the risks of the procedure, including infection, bleeding, tissue injury, clip migration, and inadequate sampling. Informed written consent was given. The usual time-out protocol was performed immediately prior to the procedure. Lesion quadrant: All quadrants of the breast. Using sterile technique and 1% Lidocaine as local anesthetic, under direct ultrasound visualization, a 14 gauge spring-loaded device was used to perform biopsy of left breast 1 o'clock mass using a lateral approach. At the conclusion of the procedure vision tissue marker clip was deployed  into the biopsy cavity. Next, using sterile technique and 1% Lidocaine as local anesthetic, under direct ultrasound visualization, a 14 gauge spring-loaded device was used to perform biopsy of left axillary lymph node using a lateral approach. At the conclusion of the procedure shaped 3 HydroMARK tissue marker clip was deployed into the biopsy cavity Follow up 2 view mammogram was not performed as the patient was sedated and unsafe to stand up. IMPRESSION: Ultrasound guided biopsy of large left breast mass and an abnormal left axillary lymph node. No apparent complications. Electronically Signed: By: Fidela Salisbury M.D. On: 04/06/2020 10:17    PERFORMANCE STATUS (ECOG) : 1 - Symptomatic but completely ambulatory  Review of Systems Unless otherwise noted, a complete review of systems is negative.  Physical Exam General: NAD Pulmonary: Unlabored Abdomen: soft, nontender, + bowel sounds GU: no suprapubic tenderness Extremities: no edema, no joint deformities Skin: no rashes Neurological: Weakness, confusion  IMPRESSION: Patient seen in the infusion area.  She seems calm at present.  No symptomatic complaints reported, although her communication is difficult at times given her mental health problems.  I had a lengthy meeting with patient's mother.  We discussed the cancer staging and treatment plan.  She verbalized understanding  that this cancer could ultimately prove to be untreatable and incurable.  It is unclear how patient will tolerate treatment or if she would be able to ultimately receive surgery.  Attempted to discuss decision-making.  [name] volunteered that she personally would not want to be resuscitated but did not state what her wishes would be for the patient.  We will readdress in the future.  Emotional support provided to mother.  She verbalized that she has many life stressors.  Note that I again called patient's community psychiatrist and left another  message.  PLAN: -Continue current scope of treatment -We'll coordinate care with patient's community psychiatrist -RTC in 1 month  Case and plan discussed with Dr. Rogue Bussing  Patient expressed understanding and was in agreement with this plan. She also understands that She can call the clinic at any time with any questions, concerns, or complaints.     Time Total: 60 minutes  Visit consisted of counseling and education dealing with the complex and emotionally intense issues of symptom management and palliative care in the setting of serious and potentially life-threatening illness.Greater than 50%  of this time was spent counseling and coordinating care related to the above assessment and plan.  Signed by: Altha Harm, PhD, NP-C

## 2020-05-04 NOTE — Progress Notes (Signed)
[place] CONSULT NOTE  Patient Care Team: Physicians, [facility] as PCP - General  CHIEF COMPLAINTS/PURPOSE OF CONSULTATION: Breast cancer  #  Oncology History Overview Note  # NOV 2021- LEFT BREAST CANCER - [place]; TRIPLE NEGATIVE; Grade of invasive carcinoma: Grade 3; # LYMPH NODE, LEFT AXILLA; ULTRASOUND-GUIDED CORE BIOPSY:  - METASTATIC MAMMARY CARCINOMA, AT LEAST 9 MM METASTASIS. ]  # SURVIVORSHIP:   # GENETICS:   DIAGNOSIS:   STAGE:         ;  GOALS:  CURRENT/MOST RECENT THERAPY :     Carcinoma of overlapping sites of left breast in female, estrogen receptor negative ([place])  04/24/2020 Initial Diagnosis   Carcinoma of overlapping sites of left breast in female, estrogen receptor negative ([place])   05/04/2020 - 05/04/2020 Chemotherapy   The patient had dexamethasone (DECADRON) 4 MG tablet, 8 mg, Oral, Daily, 1 of 1 cycle, Start date: --, End date: -- palonosetron (ALOXI) injection 0.25 mg, 0.25 mg, Intravenous,  Once, 1 of 12 cycles Administration: 0.25 mg (05/04/2020) CARBOplatin (PARAPLATIN) 220 mg in sodium chloride 0.9 % 250 mL chemo infusion, 220 mg (100 % of original dose 222.4 mg), Intravenous,  Once, 1 of 12 cycles Dose modification:   (original dose 222.4 mg, Cycle 1) Administration: 220 mg (05/04/2020) PACLitaxel (TAXOL) 132 mg in sodium chloride 0.9 % 250 mL chemo infusion (</= 48m/m2), 80 mg/m2 = 132 mg, Intravenous,  Once, 1 of 12 cycles Administration: 132 mg (05/04/2020)  for chemotherapy treatment.    05/04/2020 -  Chemotherapy   The patient had dexamethasone (DECADRON) 4 MG tablet, 8 mg, Oral, Daily, 0 of 1 cycle, Start date: --, End date: -- DOXOrubicin (ADRIAMYCIN) chemo injection 100 mg, 60 mg/m2, Intravenous,  Once, 0 of 4 cycles PALONOSETRON HCL INJECTION 0.25 MG/5ML, 0.25 mg, Intravenous,  Once, 0 of 4 cycles pegfilgrastim-jmdb (FULPHILA) injection 6 mg, 6 mg, Subcutaneous,  Once, 0 of 4 cycles cyclophosphamide (CYTOXAN) 1,000 mg  in sodium chloride 0.9 % 250 mL chemo infusion, 600 mg/m2, Intravenous,  Once, 0 of 4 cycles FOSAPREPITANT IV INFUSION 150 MG, 150 mg, Intravenous,  Once, 0 of 4 cycles  for chemotherapy treatment.       HISTORY OF PRESENTING ILLNESS: Patient is accompanied by her sister Audrey/mother SIvin Booty [name]89y.[id number].  female locally advanced triple negative breast cancer T4 N2-is here to review the results of the PET scan/proceed with neoadjuvant chemotherapy.  Patient denies any pain.  Denies any nausea vomiting denies headaches.  Review of Systems  Unable to perform ROS: Psychiatric disorder     MEDICAL HISTORY:  Past Medical History:  Diagnosis Date  . GERD (gastroesophageal reflux disease)   . IUD (intrauterine device) in place IUD was placed in 2016  . Leiomyoma of uterus   . Schizophrenia ([place]   . Thyroid disease     SURGICAL HISTORY: Past Surgical History:  Procedure Laterality Date  . PORTACATH PLACEMENT Right 05/02/2020   Procedure: INSERTION PORT-A-CATH;  Surgeon: CHerbert Pun MD;  Location: [facility] ORS;  Service: General;  Laterality: Right;    SOCIAL HISTORY: Social History   Socioeconomic History  . Marital status: Single    Spouse name: Not on file  . Number of children: Not on file  . Years of education: Not on file  . Highest education level: Not on file  Occupational History  . Not on file  Tobacco Use  . Smoking status: Never Smoker  . Smokeless tobacco: Never Used  Substance  and Sexual Activity  . Alcohol use: No  . Drug use: No  . Sexual activity: Yes    Birth control/protection: Surgical  Other Topics Concern  . Not on file  Social History Narrative  . Not on file   Social Determinants of Health   Financial Resource Strain: Not on file  Food Insecurity: Not on file  Transportation Needs: Not on file  Physical Activity: Not on file  Stress: Not on file  Social Connections: Not on file  Intimate Partner Violence: Not on file     FAMILY HISTORY: History reviewed. No pertinent family history.  ALLERGIES:  has No Known Allergies.  MEDICATIONS:  Current Outpatient Medications  Medication Sig Dispense Refill  . acetaminophen (TYLENOL) 650 MG suppository Place 1 suppository (650 mg total) rectally every 6 (six) hours as needed for fever. 12 suppository 0  . BANOPHEN 25 MG capsule Take 25 mg by mouth every 6 (six) hours as needed for itching.    . benztropine (COGENTIN) 0.5 MG tablet Take 1 tablet (0.5 mg total) by mouth 2 (two) times daily. 60 tablet 1  . cetirizine (ZYRTEC) 10 MG tablet Take 1 tablet (10 mg total) by mouth daily as needed for allergies. (Patient taking differently: Take 10 mg by mouth daily.)    . divalproex (DEPAKOTE) 500 MG DR tablet Take 1 tablet (500 mg total) by mouth every 12 (twelve) hours. 60 tablet 1  . haloperidol (HALDOL) 5 MG tablet Take 1 tablet (5 mg total) by mouth 3 (three) times daily. 90 tablet 1  . HYDROcodone-acetaminophen (NORCO) 5-325 MG tablet Take 1 tablet by mouth every 4 (four) hours as needed for up to 3 days for moderate pain. 16 tablet 0  . hydrOXYzine (ATARAX/VISTARIL) 25 MG tablet Take 25 mg by mouth 3 (three) times daily.    Marland Kitchen levothyroxine (SYNTHROID) 88 MCG tablet Take 1 tablet (88 mcg total) by mouth daily at 6 (six) AM. 30 tablet 1  . MELATONIN PO Take 1 tablet by mouth at bedtime as needed (sleep).    . haloperidol decanoate (HALDOL DECANOATE) 100 MG/ML injection Inject 100 mg into the muscle every 28 (twenty-eight) days. (Patient not taking: Reported on 05/03/2020)    . lidocaine-prilocaine (EMLA) cream Apply 1 application topically as needed. (Patient not taking: No sig reported) 30 g 3  . ondansetron (ZOFRAN) 8 MG tablet One pill every 8 hours as needed for nausea/vomitting. 40 tablet 1  . prochlorperazine (COMPAZINE) 10 MG tablet Take 1 tablet (10 mg total) by mouth every 6 (six) hours as needed for nausea or vomiting. 40 tablet 1   No current  facility-administered medications for this visit.      Marland Kitchen  PHYSICAL EXAMINATION: ECOG PERFORMANCE STATUS: 1 - Symptomatic but completely ambulatory  Vitals:   05/04/20 0956  BP: 111/64  Pulse: (!) 109  Temp: 98.4 F (36.9 C)  SpO2: 100%   Filed Weights   05/04/20 0956  Weight: 145 lb (65.8 kg)    Physical Exam Constitutional:      Comments: Accompanied by sister.  Ambulating independently.  HENT:     Head: Normocephalic and atraumatic.     Mouth/Throat:     Pharynx: No oropharyngeal exudate.  Eyes:     Pupils: Pupils are equal, round, and reactive to light.  Cardiovascular:     Rate and Rhythm: Normal rate and regular rhythm.  Pulmonary:     Effort: No respiratory distress.     Breath sounds: No wheezing.  Abdominal:  General: Bowel sounds are normal. There is no distension.     Palpations: Abdomen is soft. There is no mass.     Tenderness: There is no abdominal tenderness. There is no guarding or rebound.  Musculoskeletal:        General: No tenderness. Normal range of motion.     Cervical back: Normal range of motion and neck supple.  Skin:    General: Skin is warm.     Comments: A large tense breast noted left side; satellite nodule noted.  Skin appears edematous mildly warmth.  No discharge.  Bulky left axillary adenopathy noted.  Neurological:     Mental Status: She is alert and oriented to person, place, and time.  Psychiatric:        Mood and Affect: Affect normal.     Comments: Poor insight/tangential thoughts.  Unable to make a coherent conversation-underlying schizophrenia.      LABORATORY DATA:  I have reviewed the data as listed Lab Results  Component Value Date   WBC 14.7 (H) 05/04/2020   HGB 11.7 (L) 05/04/2020   HCT 33.4 (L) 05/04/2020   MCV 87.4 05/04/2020   PLT 336 05/04/2020   Recent Labs    06/05/19 1806 06/05/19 2117 06/07/19 1145 04/03/20 1540 04/05/20 0400 04/09/20 0432 04/10/20 0642 05/04/20 0935  NA 127* 132* 135  125*   < > 135 137 131*  K 4.8 3.5 4.4 3.1*   < > 3.6 3.6 3.3*  CL 93* 101 98 88*   < > 101 99 93*  CO2 26 24 26 24    < > 24 24 25   GLUCOSE 90 167* 97 153*   < > 99 85 101*  BUN <5* <5* 8 <5*   < > <5* <5* 6  CREATININE 0.51 0.51 0.76 0.65   < > 0.65 0.54 0.51  CALCIUM 8.7* 8.1* 10.1 9.5   < > 8.8* 9.2 9.3  GFRNONAA >60 >60 >60 >60   < > >60 >60 >60  GFRAA >60 >60 >60  --   --   --   --   --   PROT 7.1  --  7.6 8.6*  --   --   --  7.3  ALBUMIN 3.6  --  3.5 4.2  --   --   --  3.4*  AST 15  --  15 25  --   --   --  26  ALT 7  --  8 13  --   --   --  11  ALKPHOS 64  --  66 68  --   --   --  51  BILITOT 0.6  --  0.4 0.4  --   --   --  0.6   < > = values in this interval not displayed.    RADIOGRAPHIC STUDIES: I have personally reviewed the radiological images as listed and agreed with the findings in the report. DG Chest 1 View  Result Date: 04/06/2020 CLINICAL DATA:  Possible pneumonia EXAM: CHEST  1 VIEW COMPARISON:  04/04/2020 FINDINGS: Cardiac shadow is within normal limits. The lungs are well aerated bilaterally. No focal infiltrate or effusion is seen. No bony abnormality is noted. IMPRESSION: No acute abnormality seen. Electronically Signed   By: Inez Catalina M.D.   On: 04/06/2020 16:20   NM PET Image Initial (PI) Skull Base To Thigh  Result Date: 05/03/2020 CLINICAL DATA:  Initial treatment strategy for new diagnosis of left breast cancer. EXAM: NUCLEAR MEDICINE  PET SKULL BASE TO THIGH TECHNIQUE: 7.4 mCi F-18 FDG was injected intravenously. Full-ring PET imaging was performed from the skull base to thigh after the radiotracer. CT data was obtained and used for attenuation correction and anatomic localization. Fasting blood glucose: 73 mg/dl COMPARISON:  04/04/2020 CT chest, abdomen and pelvis. FINDINGS: Mediastinal blood pool activity: SUV max 1.6 Liver activity: SUV max NA NECK: No hypermetabolic neck lymph nodes. Mildly enlarged 1.0 cm left supraclavicular neck node (series 3/image  60) demonstrates no hypermetabolism. Incidental CT findings: none CHEST: Intensely hypermetabolic large 93.2 x 8.4 cm left breast mass with max SUV 66.8 (series 3/image 88), which demonstrates cutaneous involvement superficially and left pectoralis muscle involvement posteriorly. Multiple hypermetabolic satellite soft tissue nodules throughout the left breast, for example a 1.8 cm outer lower cutaneous left breast lesion with max SUV 35.8 (series 3/image 103). Numerous hypermetabolic bulky left axillary and left retropectoral lymph nodes. Representative 3.1 cm short axis diameter left axillary node with max SUV 34.6 (series 3/image 72). Representative 1.8 cm left retropectoral node with max SUV 17.3 (series 3/image 65). No enlarged or hypermetabolic right axillary nodes. No hypermetabolic mediastinal or hilar lymph nodes. Previously questioned enlarged left internal mammary lymph node demonstrates no hypermetabolism. No hypermetabolic pulmonary findings. Incidental CT findings: Mild centrilobular and paraseptal emphysema. Mild patchy ground-glass opacities in the peripheral lower lobes bilaterally, not appreciably changed. No significant pulmonary nodules. ABDOMEN/PELVIS: No abnormal hypermetabolic activity within the liver, pancreas, adrenal glands, or spleen. No hypermetabolic lymph nodes in the abdomen or pelvis. Incidental CT findings: Mildly enlarged myomatous uterus, with one of the uterine fibroids demonstrating calcific degeneration. SKELETON: No focal hypermetabolic activity to suggest skeletal metastasis. Incidental CT findings: none IMPRESSION: 1. Intensely hypermetabolic 67.1 cm left breast mass compatible with primary left breast malignancy, with left pectoralis muscle involvement posteriorly and cutaneous involvement by the mass. Multiple smaller hypermetabolic satellite malignant nodules scattered throughout the left breast. 2. Bulky hypermetabolic left axillary and left retropectoral nodal  metastases. 3. No additional sites of hypermetabolic metastatic disease. 4. Nonspecific mild patchy ground-glass opacities in the peripheral lower lobes, unchanged, with a broad differential including nonspecific postinflammatory scarring. Continued chest CT surveillance warranted. 5. Chronic findings include: Emphysema (ICD10-J43.9). Mildly enlarged myomatous uterus. Electronically Signed   By: Ilona Sorrel M.D.   On: 05/03/2020 10:57   DG Chest Port 1 View  Result Date: 05/02/2020 CLINICAL DATA:  Port-A-Cath placement, breast cancer EXAM: PORTABLE CHEST 1 VIEW COMPARISON:  04/06/2020 chest radiograph. FINDINGS: Right internal jugular Port-A-Cath terminates in the middle third of the SVC. Stable cardiomediastinal silhouette with normal heart size. No pneumothorax. No pleural effusion. Lungs appear clear, with no acute consolidative airspace disease and no pulmonary edema. IMPRESSION: Right internal jugular Port-A-Cath terminates in the middle third of the SVC. No pneumothorax. No active cardiopulmonary disease. Electronically Signed   By: Ilona Sorrel M.D.   On: 05/02/2020 14:43   DG C-Arm 1-60 Min-No Report  Result Date: 05/02/2020 Fluoroscopy was utilized by the requesting physician.  No radiographic interpretation.   [place] LT BREAST BX W LOC DEV 1ST LESION IMG BX SPEC US GUIDE  Addendum Date: 04/12/2020   ADDENDUM REPORT: 04/12/2020 15:04 ADDENDUM: PATHOLOGY revealed: Site A. LEFT BREAST MASS; ULTRASOUND-GUIDED CORE BIOPSY: - INVASIVE MAMMARY CARCINOMA, NO SPECIAL TYPE, SEE COMMENT. Size of invasive carcinoma: Not assessed, fragmented cores. Grade 3. Ductal carcinoma in situ: Not identified. Lymphovascular invasion: Not identified. Pathology results are CONCORDANT with imaging findings, per Dr. Fidela Salisbury. PATHOLOGY revealed: Site B. LYMPH  NODE, LEFT AXILLA; ULTRASOUND-GUIDED CORE BIOPSY: - METASTATIC MAMMARY CARCINOMA, AT LEAST 9 MM METASTASIS. Comment: All of the cores in A and B contain a  morphologically poorly differentiated malignant neoplasm, non-small cell type, with prominent cellular dyscohesion. No benign breast tissue is identified in A, and no lymphoid tissue is in B. Pathology results are CONCORDANT with imaging findings, per Dr. Fidela Salisbury. I was unable to speak with patient as she is currently undergoing inpatient hospitalization. Dr. Bryan Lemma [facility] pathologist) contacted Dr. Charlaine Dalton (patient's medical oncologist and provider) with patient's biopsy results. [name] provider will manage oncological care, and will arrange surgical consultation as indicated. Recommendation: Oncological and surgical consultation. [name] provider (Dr. Charlaine Dalton) will manage all oncological care and provide surgical consultation as indicated. Pathology results reported by Electa Sniff RN on 04/12/2020. Electronically Signed   By: Fidela Salisbury M.D.   On: 04/12/2020 15:04   Result Date: 04/12/2020 CLINICAL DATA:  Left breast 1 o'clock large mass. EXAM: ULTRASOUND GUIDED LEFT BREAST CORE NEEDLE BIOPSY COMPARISON:  Previous exam(s). PROCEDURE: I met with the patient and we discussed the procedure of ultrasound-guided biopsy, including benefits and alternatives. We discussed the high likelihood of a successful procedure. We discussed the risks of the procedure, including infection, bleeding, tissue injury, clip migration, and inadequate sampling. Informed written consent was given. The usual time-out protocol was performed immediately prior to the procedure. Lesion quadrant: All quadrants of the breast. Using sterile technique and 1% Lidocaine as local anesthetic, under direct ultrasound visualization, a 14 gauge spring-loaded device was used to perform biopsy of left breast 1 o'clock mass using a lateral approach. At the conclusion of the procedure vision tissue marker clip was deployed into the biopsy cavity. Next, using sterile technique and 1% Lidocaine as local  anesthetic, under direct ultrasound visualization, a 14 gauge spring-loaded device was used to perform biopsy of left axillary lymph node using a lateral approach. At the conclusion of the procedure shaped 3 HydroMARK tissue marker clip was deployed into the biopsy cavity Follow up 2 view mammogram was not performed as the patient was sedated and unsafe to stand up. IMPRESSION: Ultrasound guided biopsy of large left breast mass and an abnormal left axillary lymph node. No apparent complications. Electronically Signed: By: Fidela Salisbury M.D. On: 04/06/2020 10:17   [place] LT BREAST BX W LOC DEV EA ADD LESION IMG BX SPEC US GUIDE  Addendum Date: 04/12/2020   ADDENDUM REPORT: 04/12/2020 15:04 ADDENDUM: PATHOLOGY revealed: Site A. LEFT BREAST MASS; ULTRASOUND-GUIDED CORE BIOPSY: - INVASIVE MAMMARY CARCINOMA, NO SPECIAL TYPE, SEE COMMENT. Size of invasive carcinoma: Not assessed, fragmented cores. Grade 3. Ductal carcinoma in situ: Not identified. Lymphovascular invasion: Not identified. Pathology results are CONCORDANT with imaging findings, per Dr. Fidela Salisbury. PATHOLOGY revealed: Site B. LYMPH NODE, LEFT AXILLA; ULTRASOUND-GUIDED CORE BIOPSY: - METASTATIC MAMMARY CARCINOMA, AT LEAST 9 MM METASTASIS. Comment: All of the cores in A and B contain a morphologically poorly differentiated malignant neoplasm, non-small cell type, with prominent cellular dyscohesion. No benign breast tissue is identified in A, and no lymphoid tissue is in B. Pathology results are CONCORDANT with imaging findings, per Dr. Fidela Salisbury. I was unable to speak with patient as she is currently undergoing inpatient hospitalization. Dr. Bryan Lemma [facility] pathologist) contacted Dr. Charlaine Dalton (patient's medical oncologist and provider) with patient's biopsy results. [name] provider will manage oncological care, and will arrange surgical consultation as indicated. Recommendation: Oncological and surgical consultation.  [name] provider (Dr. Charlaine Dalton) will manage  all oncological care and provide surgical consultation as indicated. Pathology results reported by Electa Sniff RN on 04/12/2020. Electronically Signed   By: Fidela Salisbury M.D.   On: 04/12/2020 15:04   Result Date: 04/12/2020 CLINICAL DATA:  Left breast 1 o'clock large mass. EXAM: ULTRASOUND GUIDED LEFT BREAST CORE NEEDLE BIOPSY COMPARISON:  Previous exam(s). PROCEDURE: I met with the patient and we discussed the procedure of ultrasound-guided biopsy, including benefits and alternatives. We discussed the high likelihood of a successful procedure. We discussed the risks of the procedure, including infection, bleeding, tissue injury, clip migration, and inadequate sampling. Informed written consent was given. The usual time-out protocol was performed immediately prior to the procedure. Lesion quadrant: All quadrants of the breast. Using sterile technique and 1% Lidocaine as local anesthetic, under direct ultrasound visualization, a 14 gauge spring-loaded device was used to perform biopsy of left breast 1 o'clock mass using a lateral approach. At the conclusion of the procedure vision tissue marker clip was deployed into the biopsy cavity. Next, using sterile technique and 1% Lidocaine as local anesthetic, under direct ultrasound visualization, a 14 gauge spring-loaded device was used to perform biopsy of left axillary lymph node using a lateral approach. At the conclusion of the procedure shaped 3 HydroMARK tissue marker clip was deployed into the biopsy cavity Follow up 2 view mammogram was not performed as the patient was sedated and unsafe to stand up. IMPRESSION: Ultrasound guided biopsy of large left breast mass and an abnormal left axillary lymph node. No apparent complications. Electronically Signed: By: Fidela Salisbury M.D. On: 04/06/2020 10:17        ASSESSMENT & PLAN:   Carcinoma of overlapping sites of left breast in female,  estrogen receptor negative ([place]) #Left breast cancer T4 N2 M0-stage III triple negative; DEC 9th PET scan-Intensely hypermetabolic 58.8 cm left breast mass compatible with primary left breast malignancy, with left pectoralis muscle involvement posteriorly and cutaneous involvement by the mass. Multiple smaller hypermetabolic satellite malignant nodules scattered throughout the left breast. Bulky hypermetabolic left axillary and left retropectoral nodal metastases.  Will also need MRI brain [will need sedative]. Will need MUGA scan.  # proceed with #1 of weekly carbotaxol x12; and then proceed with Adriamycin Cytoxan if patient tolerating chemotherapy better.  Labs today reviewed;  acceptable for treatment today.   Discussed the potential side effects including but not limited to-increasing fatigue, nausea vomiting, diarrhea, hair loss, sores in the mouth, increase risk of infection and also neuropathy.   # schizophrenia: Clinically stable.  Follows up with [facility] [phone]; Dr.A].  Unable to reach out to patient psychiatrist.  Discussed with Dr. Regenia Skeeter.  # I reviewed the blood work- with the patient's mother and sister in detail; also reviewed the imaging independently [as summarized above].    # DISPOSITION: # chemo today # in 1 week-MD;  labs-cbc/CMP;carbo-taxol-weekly chemo # on 27th- X MD; labs-cbc/CMP; carbo-taxol-weekly- Dr.B  Addendum: Patient had infusion reaction to Taxol [flushing; chest pressure; initial elevated blood pressure; followed by hypotension systolic 86V.  Prompt improved with IV fluids Solu-Medrol.  Discontinue Taxol.  Proceeded with [place] platinum.]  #We will discontinue carbotaxol chemotherapy [Abraxane shortage; suspect poor tolerance to Taxotere; I would recommend Adriamycin Cytoxan cycle.  We will get MUGA scan stat].   # 40 minutes face-to-face with the patient discussing the above plan of care; more than 50% of time spent on prognosis/ natural history;  counseling and coordination.   All questions were answered. The patient knows to call the  clinic with any problems, questions or concerns.    Cammie Sickle, MD 05/04/2020 9:39 PM

## 2020-05-04 NOTE — Assessment & Plan Note (Addendum)
#  Left breast cancer T4 N2 M0-stage III triple negative; DEC 9th PET scan-Intensely hypermetabolic 68.1 cm left breast mass compatible with primary left breast malignancy, with left pectoralis muscle involvement posteriorly and cutaneous involvement by the mass. Multiple smaller hypermetabolic satellite malignant nodules scattered throughout the left breast. Bulky hypermetabolic left axillary and left retropectoral nodal metastases.  Will also need MRI brain [will need sedative]. Will need MUGA scan.  # proceed with #1 of weekly carbotaxol x12; and then proceed with Adriamycin Cytoxan if patient tolerating chemotherapy better.  Labs today reviewed;  acceptable for treatment today.   Discussed the potential side effects including but not limited to-increasing fatigue, nausea vomiting, diarrhea, hair loss, sores in the mouth, increase risk of infection and also neuropathy.   # schizophrenia: Clinically stable.  Follows up with [facility] [phone]; Dr.A].  Unable to reach out to patient psychiatrist.  Discussed with Dr. Regenia Skeeter.  # I reviewed the blood work- with the patient's mother and sister in detail; also reviewed the imaging independently [as summarized above].    # DISPOSITION: # chemo today # in 1 week-MD;  labs-cbc/CMP;carbo-taxol-weekly chemo # on 27th- X MD; labs-cbc/CMP; carbo-taxol-weekly- Dr.B  Addendum: Patient had infusion reaction to Taxol [flushing; chest pressure; initial elevated blood pressure; followed by hypotension systolic 94W.  Prompt improved with IV fluids Solu-Medrol.  Discontinue Taxol.  Proceeded with [place] platinum.]  #We will discontinue carbotaxol chemotherapy [Abraxane shortage; suspect poor tolerance to Taxotere; I would recommend Adriamycin Cytoxan cycle.  We will get MUGA scan stat].   # 40 minutes face-to-face with the patient discussing the above plan of care; more than 50% of time spent on prognosis/ natural history; counseling and coordination.

## 2020-05-05 LAB — CANCER ANTIGEN 15-3: CA 15-3: 132 U/mL — ABNORMAL HIGH (ref 0.0–25.0)

## 2020-05-05 LAB — CANCER ANTIGEN 27.29: CA 27.29: 137.3 U/mL — ABNORMAL HIGH (ref 0.0–38.6)

## 2020-05-07 ENCOUNTER — Telehealth: Payer: Self-pay

## 2020-05-07 NOTE — Telephone Encounter (Signed)
Telephone call to patient for follow up after receiving first infusion.   No answer but left message stating we were calling to check on them.  Encouraged patient to call for any questions or concerns.   

## 2020-05-07 NOTE — Anesthesia Postprocedure Evaluation (Signed)
Anesthesia Post Note  Patient: Tamara [name]  Procedure(s) Performed: INSERTION PORT-A-CATH (Right Chest)  Patient location during evaluation: PACU Anesthesia Type: General Level of consciousness: awake and alert and oriented Pain management: pain level controlled Vital Signs Assessment: post-procedure vital signs reviewed and stable Respiratory status: spontaneous breathing Cardiovascular status: blood pressure returned to baseline Anesthetic complications: no   No complications documented.   Last Vitals:  Vitals:   05/02/20 1458 05/02/20 1522  BP: (!) 124/57 138/74  Pulse: 87 74  Resp: 16 18  Temp: (!) 36.2 C   SpO2: 100% 100%    Last Pain:  Vitals:   05/03/20 0835  TempSrc:   PainSc: 2                  Kynzee Devinney

## 2020-05-10 ENCOUNTER — Ambulatory Visit
Admission: RE | Admit: 2020-05-10 | Discharge: 2020-05-10 | Disposition: A | Discharge status: Home / Self Care | Payer: Medicaid Other | Source: Ambulatory Visit | Attending: Internal Medicine | Admitting: Internal Medicine

## 2020-05-10 ENCOUNTER — Other Ambulatory Visit: Payer: Self-pay

## 2020-05-10 DIAGNOSIS — Z9221 Personal history of antineoplastic chemotherapy: Secondary | ICD-10-CM | POA: Diagnosis present

## 2020-05-10 DIAGNOSIS — Z171 Estrogen receptor negative status [ER-]: Secondary | ICD-10-CM | POA: Insufficient documentation

## 2020-05-10 DIAGNOSIS — C50812 Malignant neoplasm of overlapping sites of left female breast: Secondary | ICD-10-CM | POA: Insufficient documentation

## 2020-05-10 MED ORDER — TECHNETIUM TC 99M-LABELED RED BLOOD CELLS IV KIT
20.0000 | PACK | Freq: Once | INTRAVENOUS | Status: AC | PRN
  Administered 2020-05-10: 13:00:00 21.71 via INTRAVENOUS

## 2020-05-11 ENCOUNTER — Inpatient Hospital Stay (HOSPITAL_BASED_OUTPATIENT_CLINIC_OR_DEPARTMENT_OTHER): Payer: Medicaid Other | Admitting: Internal Medicine

## 2020-05-11 ENCOUNTER — Inpatient Hospital Stay: Payer: Medicaid Other

## 2020-05-11 ENCOUNTER — Encounter: Payer: Self-pay | Admitting: Internal Medicine

## 2020-05-11 DIAGNOSIS — Z171 Estrogen receptor negative status [ER-]: Secondary | ICD-10-CM

## 2020-05-11 DIAGNOSIS — C50812 Malignant neoplasm of overlapping sites of left female breast: Secondary | ICD-10-CM

## 2020-05-11 DIAGNOSIS — Z5111 Encounter for antineoplastic chemotherapy: Secondary | ICD-10-CM | POA: Diagnosis not present

## 2020-05-11 DIAGNOSIS — Z7189 Other specified counseling: Secondary | ICD-10-CM

## 2020-05-11 LAB — COMPREHENSIVE METABOLIC PANEL
ALT: 15 U/L (ref 0–44)
AST: 20 U/L (ref 15–41)
Albumin: 3.2 g/dL — ABNORMAL LOW (ref 3.5–5.0)
Alkaline Phosphatase: 50 U/L (ref 38–126)
Anion gap: 9 (ref 5–15)
BUN: <5 mg/dL — ABNORMAL LOW (ref 6–20)
CO2: 28 mmol/L (ref 22–32)
Calcium: 8.9 mg/dL (ref 8.9–10.3)
Chloride: 88 mmol/L — ABNORMAL LOW (ref 98–111)
Creatinine, Ser: 0.48 mg/dL (ref 0.44–1.00)
GFR, Estimated: >60 mL/min (ref >60)
Glucose, Bld: 81 mg/dL (ref 70–99)
Potassium: 3.9 mmol/L (ref 3.5–5.1)
Sodium: 125 mmol/L — ABNORMAL LOW (ref 135–145)
Total Bilirubin: 0.5 mg/dL (ref 0.3–1.2)
Total Protein: 7.2 g/dL (ref 6.5–8.1)

## 2020-05-11 LAB — CBC WITH DIFFERENTIAL/PLATELET
Abs Immature Granulocytes: 0.22 10*3/uL — ABNORMAL HIGH (ref 0.00–0.07)
Basophils Absolute: 0 10*3/uL (ref 0.0–0.1)
Basophils Relative: 0 %
Eosinophils Absolute: 0 10*3/uL (ref 0.0–0.5)
Eosinophils Relative: 0 %
HCT: 28.6 % — ABNORMAL LOW (ref 36.0–46.0)
Hemoglobin: 10.2 g/dL — ABNORMAL LOW (ref 12.0–15.0)
Immature Granulocytes: 4 %
Lymphocytes Relative: 16 %
Lymphs Abs: 1 10*3/uL (ref 0.7–4.0)
MCH: 30.5 pg (ref 26.0–34.0)
MCHC: 35.7 g/dL (ref 30.0–36.0)
MCV: 85.6 fL (ref 80.0–100.0)
Monocytes Absolute: 1.7 10*3/uL — ABNORMAL HIGH (ref 0.1–1.0)
Monocytes Relative: 27 %
Neutro Abs: 3.3 10*3/uL (ref 1.7–7.7)
Neutrophils Relative %: 53 %
Platelets: 388 10*3/uL (ref 150–400)
RBC: 3.34 MIL/uL — ABNORMAL LOW (ref 3.87–5.11)
RDW: 12.5 % (ref 11.5–15.5)
WBC: 6.4 10*3/uL (ref 4.0–10.5)
nRBC: 0 % (ref 0.0–0.2)

## 2020-05-11 MED ORDER — DEXAMETHASONE SODIUM PHOSPHATE 100 MG/10ML IJ SOLN
10.0000 mg | Freq: Once | INTRAMUSCULAR | Status: AC
  Administered 2020-05-11: 10:00:00 10 mg via INTRAVENOUS
  Filled 2020-05-11: qty 10

## 2020-05-11 MED ORDER — DOXORUBICIN HCL CHEMO IV INJECTION 2 MG/ML
60.0000 mg/m2 | Freq: Once | INTRAVENOUS | Status: AC
  Administered 2020-05-11: 11:00:00 100 mg via INTRAVENOUS
  Filled 2020-05-11: qty 50

## 2020-05-11 MED ORDER — SODIUM CHLORIDE 0.9 % IV SOLN
Freq: Once | INTRAVENOUS | Status: AC
  Filled 2020-05-11: qty 250

## 2020-05-11 MED ORDER — HEPARIN SOD (PORK) LOCK FLUSH 100 UNIT/ML IV SOLN
500.0000 [IU] | Freq: Once | INTRAVENOUS | Status: AC | PRN
  Administered 2020-05-11: 12:00:00 500 [IU]
  Filled 2020-05-11: qty 5

## 2020-05-11 MED ORDER — SODIUM CHLORIDE 0.9 % IV SOLN
150.0000 mg | Freq: Once | INTRAVENOUS | Status: AC
  Administered 2020-05-11: 11:00:00 150 mg via INTRAVENOUS
  Filled 2020-05-11: qty 150

## 2020-05-11 MED ORDER — PALONOSETRON HCL INJECTION 0.25 MG/5ML
0.2500 mg | Freq: Once | INTRAVENOUS | Status: AC
  Administered 2020-05-11: 10:00:00 0.25 mg via INTRAVENOUS
  Filled 2020-05-11: qty 5

## 2020-05-11 MED ORDER — HEPARIN SOD (PORK) LOCK FLUSH 100 UNIT/ML IV SOLN
INTRAVENOUS | Status: AC
  Filled 2020-05-11: qty 5

## 2020-05-11 MED ORDER — SODIUM CHLORIDE 0.9 % IV SOLN
600.0000 mg/m2 | Freq: Once | INTRAVENOUS | Status: AC
  Administered 2020-05-11: 12:00:00 1000 mg via INTRAVENOUS
  Filled 2020-05-11: qty 50

## 2020-05-11 NOTE — Progress Notes (Signed)
1004- Sodium: 125 today. MD, Dr. Rogue Bussing, notified and aware. Per MD order: okay to proceed with Adriamycin and Cytoxan treatment today; no new orders at this time.  1230- Patient tolerated treatment well. Patient stable and discharged to home at this time.

## 2020-05-11 NOTE — Assessment & Plan Note (Addendum)
#  Left breast cancer T4 N2 M0-stage III triple negative; DEC 9th PET scan-I locally advanced breast cancer negative for distant site disease.  Cycle #1 Taxol discontinued after hypersensitive reaction.  Only 1 dose of carboplatin.  Plan Adriamycin Cytoxan every 3 weeks  #Proceed with Adriamycin Cytoxan chemotherapy cycle #1; Fulphila on 12/20  #Taxol hypersensitivity reaction-grade 3 patient had infusion reaction to Taxol [flushing; chest pressure; initial elevated blood pressure; followed by hypotension systolic 78E.  Prompt improved with IV fluids Solu-Medrol.  Discontinue Taxol.   # schizophrenia: STABLE; .  Follows up with [facility] [phone]; Dr.Ahluwalia].    # DISPOSITION: # chemo today; # fulphila on 12/20 # follow up-labs- cbc/cmp; [place] in 10 days; possible IVFs over 1 hour # Follow up on Jan 10th- MD; labs- cbc/cmp; D-2 fulphila; Dr.B  # 40 minutes face-to-face with the patient's sister discussing the above plan of care; more than 50% of time spent on prognosis/ natural history; counseling and coordination.

## 2020-05-11 NOTE — Progress Notes (Signed)
[facility] CONSULT NOTE  Patient Care Team: Physicians, [place] as PCP - General  CHIEF COMPLAINTS/PURPOSE OF CONSULTATION: Breast cancer  #  Oncology History Overview Note  # NOV 2021- LEFT BREAST CANCER - [place]; TRIPLE NEGATIVE; Grade of invasive carcinoma: Grade 3; # LYMPH NODE, LEFT AXILLA; [Dr.Cintron]; ULTRASOUND-GUIDED CORE BIOPSY:  - METASTATIC MAMMARY CARCINOMA, AT LEAST 9 MM METASTASIS. ]; DEC 9th PET scan-Intensely hypermetabolic 62.9 cm left breast mass compatible with primary left breast malignancy, with left pectoralis muscle involvement posteriorly and cutaneous involvement by the mass. Multiple smaller hypermetabolic satellite malignant nodules scattered throughout the left breast. Bulky hypermetabolic left axillary and left retropectoral nodal metastases.   # 12/10- Carboplatin ONLY;  Taxol hypersensitivity reaction-discontinued  # 12/17- AC #1 q 3 W  # SURVIVORSHIP:   # GENETICS: p  DIAGNOSIS: Triple negative breast cancer  STAGE: Stage III       ;  GOALS: Cure  CURRENT/MOST RECENT THERAPY : AC    Carcinoma of overlapping sites of left breast in female, estrogen receptor negative ([place])  04/24/2020 Initial Diagnosis   Carcinoma of overlapping sites of left breast in female, estrogen receptor negative ([place])   05/04/2020 - 05/04/2020 Chemotherapy   The patient had dexamethasone (DECADRON) 4 MG tablet, 8 mg, Oral, Daily, 1 of 1 cycle, Start date: --, End date: -- palonosetron (ALOXI) injection 0.25 mg, 0.25 mg, Intravenous,  Once, 1 of 12 cycles Administration: 0.25 mg (05/04/2020) CARBOplatin (PARAPLATIN) 220 mg in sodium chloride 0.9 % 250 mL chemo infusion, 220 mg (100 % of original dose 222.4 mg), Intravenous,  Once, 1 of 12 cycles Dose modification:   (original dose 222.4 mg, Cycle 1) Administration: 220 mg (05/04/2020) PACLitaxel (TAXOL) 132 mg in sodium chloride 0.9 % 250 mL chemo infusion (</= 80mg /m2), 80 mg/m2 = 132 mg, Intravenous,   Once, 1 of 12 cycles Administration: 132 mg (05/04/2020)  for chemotherapy treatment.    05/11/2020 -  Chemotherapy   The patient had dexamethasone (DECADRON) 4 MG tablet, 8 mg, Oral, Daily, 1 of 1 cycle, Start date: --, End date: -- DOXOrubicin (ADRIAMYCIN) chemo injection 100 mg, 60 mg/m2 = 100 mg, Intravenous,  Once, 1 of 4 cycles Administration: 100 mg (05/11/2020) palonosetron (ALOXI) injection 0.25 mg, 0.25 mg, Intravenous,  Once, 1 of 4 cycles Administration: 0.25 mg (05/11/2020) pegfilgrastim-jmdb (FULPHILA) injection 6 mg, 6 mg, Subcutaneous,  Once, 1 of 4 cycles cyclophosphamide (CYTOXAN) 1,000 mg in sodium chloride 0.9 % 250 mL chemo infusion, 600 mg/m2 = 1,000 mg, Intravenous,  Once, 1 of 4 cycles Administration: 1,000 mg (05/11/2020) fosaprepitant (EMEND) 150 mg in sodium chloride 0.9 % 145 mL IVPB, 150 mg, Intravenous,  Once, 1 of 4 cycles Administration: 150 mg (05/11/2020)  for chemotherapy treatment.       HISTORY OF PRESENTING ILLNESS: Patient is accompanied by her sister Audrey/mother Ivin Booty. Theodosia Paling 49 y.o.  female locally advanced triple negative breast cancer T4 N2-is here to review the results of the PET scan/proceed with neoadjuvant chemotherapy.  Patient denies any pain.  Denies any nausea vomiting denies headaches.  Review of Systems  Unable to perform ROS: Psychiatric disorder     MEDICAL HISTORY:  Past Medical History:  Diagnosis Date  . GERD (gastroesophageal reflux disease)   . IUD (intrauterine device) in place IUD was placed in 2016  . Leiomyoma of uterus   . Schizophrenia ([place])   . Thyroid disease     SURGICAL HISTORY: Past Surgical History:  Procedure Laterality  Date  . PORTACATH PLACEMENT Right 05/02/2020   Procedure: INSERTION PORT-A-CATH;  Surgeon: Herbert Pun, MD;  Location: [facility] ORS;  Service: General;  Laterality: Right;    SOCIAL HISTORY: Social History   Socioeconomic History  . Marital status: Single     Spouse name: Not on file  . Number of children: Not on file  . Years of education: Not on file  . Highest education level: Not on file  Occupational History  . Not on file  Tobacco Use  . Smoking status: Never Smoker  . Smokeless tobacco: Never Used  Substance and Sexual Activity  . Alcohol use: No  . Drug use: No  . Sexual activity: Yes    Birth control/protection: Surgical  Other Topics Concern  . Not on file  Social History Narrative  . Not on file   Social Determinants of Health   Financial Resource Strain: Not on file  Food Insecurity: Not on file  Transportation Needs: Not on file  Physical Activity: Not on file  Stress: Not on file  Social Connections: Not on file  Intimate Partner Violence: Not on file    FAMILY HISTORY: No family history on file.  ALLERGIES:  is allergic to paclitaxel.  MEDICATIONS:  Current Outpatient Medications  Medication Sig Dispense Refill  . acetaminophen (TYLENOL) 650 MG suppository Place 1 suppository (650 mg total) rectally every 6 (six) hours as needed for fever. 12 suppository 0  . BANOPHEN 25 MG capsule Take 25 mg by mouth every 6 (six) hours as needed for itching.    . benztropine (COGENTIN) 0.5 MG tablet Take 1 tablet (0.5 mg total) by mouth 2 (two) times daily. 60 tablet 1  . cetirizine (ZYRTEC) 10 MG tablet Take 1 tablet (10 mg total) by mouth daily as needed for allergies. (Patient taking differently: Take 10 mg by mouth daily.)    . divalproex (DEPAKOTE) 500 MG DR tablet Take 1 tablet (500 mg total) by mouth every 12 (twelve) hours. 60 tablet 1  . haloperidol (HALDOL) 5 MG tablet Take 1 tablet (5 mg total) by mouth 3 (three) times daily. 90 tablet 1  . haloperidol decanoate (HALDOL DECANOATE) 100 MG/ML injection Inject 100 mg into the muscle every 28 (twenty-eight) days.    . hydrOXYzine (ATARAX/VISTARIL) 25 MG tablet Take 25 mg by mouth 3 (three) times daily.    Marland Kitchen levothyroxine (SYNTHROID) 88 MCG tablet Take 1 tablet (88 mcg  total) by mouth daily at 6 (six) AM. 30 tablet 1  . lidocaine-prilocaine (EMLA) cream Apply 1 application topically as needed. 30 g 3  . MELATONIN PO Take 1 tablet by mouth at bedtime as needed (sleep).    . ondansetron (ZOFRAN) 8 MG tablet One pill every 8 hours as needed for nausea/vomitting. 40 tablet 1  . prochlorperazine (COMPAZINE) 10 MG tablet Take 1 tablet (10 mg total) by mouth every 6 (six) hours as needed for nausea or vomiting. 40 tablet 1   No current facility-administered medications for this visit.      Marland Kitchen  PHYSICAL EXAMINATION: ECOG PERFORMANCE STATUS: 1 - Symptomatic but completely ambulatory  Vitals:   05/11/20 0922  BP: 105/66  Pulse: 87  Resp: 16  Temp: (!) 97.2 F (36.2 C)  SpO2: 100%   Filed Weights   05/11/20 0922  Weight: 152 lb (68.9 kg)    Physical Exam Constitutional:      Comments: Accompanied by sister.  Ambulating independently.  HENT:     Head: Normocephalic and atraumatic.  Mouth/Throat:     Pharynx: No oropharyngeal exudate.  Eyes:     Pupils: Pupils are equal, round, and reactive to light.  Cardiovascular:     Rate and Rhythm: Normal rate and regular rhythm.  Pulmonary:     Effort: No respiratory distress.     Breath sounds: No wheezing.  Abdominal:     General: Bowel sounds are normal. There is no distension.     Palpations: Abdomen is soft. There is no mass.     Tenderness: There is no abdominal tenderness. There is no guarding or rebound.  Musculoskeletal:        General: No tenderness. Normal range of motion.     Cervical back: Normal range of motion and neck supple.  Skin:    General: Skin is warm.     Comments: A large tense breast noted left side; satellite nodule noted.  Skin appears edematous mildly warmth.  No discharge.  Bulky left axillary adenopathy noted.  Neurological:     Mental Status: She is alert and oriented to person, place, and time.  Psychiatric:        Mood and Affect: Affect normal.     Comments:  Poor insight/tangential thoughts.  Unable to make a coherent conversation-underlying schizophrenia.      LABORATORY DATA:  I have reviewed the data as listed Lab Results  Component Value Date   WBC 6.4 05/11/2020   HGB 10.2 (L) 05/11/2020   HCT 28.6 (L) 05/11/2020   MCV 85.6 05/11/2020   PLT 388 05/11/2020   Recent Labs    06/05/19 1806 06/05/19 2117 06/07/19 1145 04/03/20 1540 04/05/20 0400 04/10/20 0642 05/04/20 0935 05/11/20 0914  NA 127* 132* 135 125*   < > 137 131* 125*  K 4.8 3.5 4.4 3.1*   < > 3.6 3.3* 3.9  CL 93* 101 98 88*   < > 99 93* 88*  CO2 26 24 26 24    < > 24 25 28   GLUCOSE 90 167* 97 153*   < > 85 101* 81  BUN <5* <5* 8 <5*   < > <5* 6 <5*  CREATININE 0.51 0.51 0.76 0.65   < > 0.54 0.51 0.48  CALCIUM 8.7* 8.1* 10.1 9.5   < > 9.2 9.3 8.9  GFRNONAA >60 >60 >60 >60   < > >60 >60 >60  GFRAA >60 >60 >60  --   --   --   --   --   PROT 7.1  --  7.6 8.6*  --   --  7.3 7.2  ALBUMIN 3.6  --  3.5 4.2  --   --  3.4* 3.2*  AST 15  --  15 25  --   --  26 20  ALT 7  --  8 13  --   --  11 15  ALKPHOS 64  --  66 68  --   --  51 50  BILITOT 0.6  --  0.4 0.4  --   --  0.6 0.5   < > = values in this interval not displayed.    RADIOGRAPHIC STUDIES: I have personally reviewed the radiological images as listed and agreed with the findings in the report. NM Cardiac Muga Rest  Result Date: 05/10/2020 CLINICAL DATA:  Carcinoma of overlapping sites of LEFT breast today female, post chemotherapy evaluation EXAM: NUCLEAR MEDICINE CARDIAC BLOOD POOL IMAGING (MUGA) TECHNIQUE: Cardiac multi-gated acquisition was performed at rest following intravenous injection of Tc-96m labeled red blood  cells. RADIOPHARMACEUTICALS:  21.71 mCi Tc-19m pertechnetate in-vitro labeled red blood cells IV COMPARISON:  None FINDINGS: Calculated LEFT ventricular ejection fraction is 60.7%, normal. Study was obtained at a cardiac rate of 93 bpm. Patient was rhythmic during imaging. Cine analysis of the LEFT  ventricle in 3 projections demonstrates normal LV wall motion. IMPRESSION: Normal LEFT ventricular ejection fraction of 60.7%. Normal LV wall motion. Electronically Signed   By: Lavonia Dana M.D.   On: 05/10/2020 15:59   NM PET Image Initial (PI) Skull Base To Thigh  Result Date: 05/03/2020 CLINICAL DATA:  Initial treatment strategy for new diagnosis of left breast cancer. EXAM: NUCLEAR MEDICINE PET SKULL BASE TO THIGH TECHNIQUE: 7.4 mCi F-18 FDG was injected intravenously. Full-ring PET imaging was performed from the skull base to thigh after the radiotracer. CT data was obtained and used for attenuation correction and anatomic localization. Fasting blood glucose: 73 mg/dl COMPARISON:  04/04/2020 CT chest, abdomen and pelvis. FINDINGS: Mediastinal blood pool activity: SUV max 1.6 Liver activity: SUV max NA NECK: No hypermetabolic neck lymph nodes. Mildly enlarged 1.0 cm left supraclavicular neck node (series 3/image 60) demonstrates no hypermetabolism. Incidental CT findings: none CHEST: Intensely hypermetabolic large 50.2 x 8.4 cm left breast mass with max SUV 66.8 (series 3/image 88), which demonstrates cutaneous involvement superficially and left pectoralis muscle involvement posteriorly. Multiple hypermetabolic satellite soft tissue nodules throughout the left breast, for example a 1.8 cm outer lower cutaneous left breast lesion with max SUV 35.8 (series 3/image 103). Numerous hypermetabolic bulky left axillary and left retropectoral lymph nodes. Representative 3.1 cm short axis diameter left axillary node with max SUV 34.6 (series 3/image 72). Representative 1.8 cm left retropectoral node with max SUV 17.3 (series 3/image 65). No enlarged or hypermetabolic right axillary nodes. No hypermetabolic mediastinal or hilar lymph nodes. Previously questioned enlarged left internal mammary lymph node demonstrates no hypermetabolism. No hypermetabolic pulmonary findings. Incidental CT findings: Mild centrilobular and  paraseptal emphysema. Mild patchy ground-glass opacities in the peripheral lower lobes bilaterally, not appreciably changed. No significant pulmonary nodules. ABDOMEN/PELVIS: No abnormal hypermetabolic activity within the liver, pancreas, adrenal glands, or spleen. No hypermetabolic lymph nodes in the abdomen or pelvis. Incidental CT findings: Mildly enlarged myomatous uterus, with one of the uterine fibroids demonstrating calcific degeneration. SKELETON: No focal hypermetabolic activity to suggest skeletal metastasis. Incidental CT findings: none IMPRESSION: 1. Intensely hypermetabolic 77.4 cm left breast mass compatible with primary left breast malignancy, with left pectoralis muscle involvement posteriorly and cutaneous involvement by the mass. Multiple smaller hypermetabolic satellite malignant nodules scattered throughout the left breast. 2. Bulky hypermetabolic left axillary and left retropectoral nodal metastases. 3. No additional sites of hypermetabolic metastatic disease. 4. Nonspecific mild patchy ground-glass opacities in the peripheral lower lobes, unchanged, with a broad differential including nonspecific postinflammatory scarring. Continued chest CT surveillance warranted. 5. Chronic findings include: Emphysema (ICD10-J43.9). Mildly enlarged myomatous uterus. Electronically Signed   By: Ilona Sorrel M.D.   On: 05/03/2020 10:57   DG Chest Port 1 View  Result Date: 05/02/2020 CLINICAL DATA:  Port-A-Cath placement, breast cancer EXAM: PORTABLE CHEST 1 VIEW COMPARISON:  04/06/2020 chest radiograph. FINDINGS: Right internal jugular Port-A-Cath terminates in the middle third of the SVC. Stable cardiomediastinal silhouette with normal heart size. No pneumothorax. No pleural effusion. Lungs appear clear, with no acute consolidative airspace disease and no pulmonary edema. IMPRESSION: Right internal jugular Port-A-Cath terminates in the middle third of the SVC. No pneumothorax. No active cardiopulmonary  disease. Electronically Signed   By: Corene Cornea  A Poff M.D.   On: 05/02/2020 14:43   DG C-Arm 1-60 Min-No Report  Result Date: 05/02/2020 Fluoroscopy was utilized by the requesting physician.  No radiographic interpretation.        ASSESSMENT & PLAN:   Carcinoma of overlapping sites of left breast in female, estrogen receptor negative ([place]) #Left breast cancer T4 N2 M0-stage III triple negative; DEC 9th PET scan-I locally advanced breast cancer negative for distant site disease.  Cycle #1 Taxol discontinued after hypersensitive reaction.  Only 1 dose of carboplatin.  Plan Adriamycin Cytoxan every 3 weeks  #Proceed with Adriamycin Cytoxan chemotherapy cycle #1; Fulphila on 12/20  #Taxol hypersensitivity reaction-grade 3 patient had infusion reaction to Taxol [flushing; chest pressure; initial elevated blood pressure; followed by hypotension systolic 94W.  Prompt improved with IV fluids Solu-Medrol.  Discontinue Taxol.   # schizophrenia: STABLE; .  Follows up with [facility] [phone]; Dr.Ahluwalia].    # DISPOSITION: # chemo today; # fulphila on 12/20 # follow up-labs- cbc/cmp; [place] in 10 days; possible IVFs over 1 hour # Follow up on Jan 10th- MD; labs- cbc/cmp; D-2 fulphila; Dr.B  # 40 minutes face-to-face with the patient's sister discussing the above plan of care; more than 50% of time spent on prognosis/ natural history; counseling and coordination.      All questions were answered. The patient knows to call the clinic with any problems, questions or concerns.    Cammie Sickle, MD 05/13/2020 8:38 PM

## 2020-05-12 ENCOUNTER — Other Ambulatory Visit: Payer: Self-pay

## 2020-05-12 ENCOUNTER — Ambulatory Visit
Admission: RE | Admit: 2020-05-12 | Discharge: 2020-05-12 | Disposition: A | Discharge status: Home / Self Care | Payer: Medicaid Other | Source: Ambulatory Visit | Attending: Internal Medicine | Admitting: Internal Medicine

## 2020-05-12 DIAGNOSIS — R42 Dizziness and giddiness: Secondary | ICD-10-CM | POA: Diagnosis present

## 2020-05-12 DIAGNOSIS — Z171 Estrogen receptor negative status [ER-]: Secondary | ICD-10-CM | POA: Diagnosis present

## 2020-05-12 DIAGNOSIS — C50812 Malignant neoplasm of overlapping sites of left female breast: Secondary | ICD-10-CM | POA: Insufficient documentation

## 2020-05-12 MED ORDER — GADOBUTROL 1 MMOL/ML IV SOLN
7.0000 mL | Freq: Once | INTRAVENOUS | Status: AC | PRN
  Administered 2020-05-12: 18:00:00 7 mL via INTRAVENOUS

## 2020-05-14 ENCOUNTER — Other Ambulatory Visit: Payer: Self-pay

## 2020-05-14 ENCOUNTER — Inpatient Hospital Stay: Payer: Medicaid Other

## 2020-05-14 DIAGNOSIS — Z7189 Other specified counseling: Secondary | ICD-10-CM

## 2020-05-14 DIAGNOSIS — Z171 Estrogen receptor negative status [ER-]: Secondary | ICD-10-CM

## 2020-05-14 DIAGNOSIS — Z5111 Encounter for antineoplastic chemotherapy: Secondary | ICD-10-CM | POA: Diagnosis not present

## 2020-05-14 MED ORDER — PEGFILGRASTIM-JMDB 6 MG/0.6ML ~~LOC~~ SOSY
6.0000 mg | PREFILLED_SYRINGE | Freq: Once | SUBCUTANEOUS | Status: AC
  Administered 2020-05-14: 11:00:00 6 mg via SUBCUTANEOUS
  Filled 2020-05-14: qty 0.6

## 2020-05-14 NOTE — Progress Notes (Signed)
H/T-please inform patient sister/mother that the patient's MRI of the brain is normal. Thank you, GB ------------------------- Hi-I wanted to let you know that the MRI of the brain is normal.  It does NOT show any evidence of cancer spread into the brain.  Continue current chemotherapy plan.  Please call us if any questions or concerns.  Thank you, GB

## 2020-05-15 ENCOUNTER — Telehealth: Payer: Self-pay | Admitting: *Deleted

## 2020-05-15 NOTE — Telephone Encounter (Signed)
Contacted patient's sister to discuss her MRI results and follow-up with patient status/ post chemotherapy treatment [facility]). Per sister, patient is doing well. Sister picked up claritin OTC for patient to take prevent boney pain from Fulphila injection. Reinforced education for use of antiemetics if patient becomes nauseated. Encouraged patient's sister to have patient to continue to hydrate. Sister gave verbal understanding of the plan of care.

## 2020-05-15 NOTE — Telephone Encounter (Signed)
-----   Message from Cammie Sickle, MD sent at 05/14/2020  8:16 PM EST ----- H/T-please inform patient sister/mother that the patient's MRI of the brain is normal. Thank you, GB ------------------------- Hi-I wanted to let you know that the MRI of the brain is normal.  It does NOT show any evidence of cancer spread into the brain.  Continue current chemotherapy plan.  Please call us if any questions or concerns.  Thank you, GB

## 2020-05-21 ENCOUNTER — Ambulatory Visit: Payer: Medicaid Other | Admitting: Oncology

## 2020-05-21 ENCOUNTER — Other Ambulatory Visit: Payer: Medicaid Other

## 2020-05-21 ENCOUNTER — Ambulatory Visit: Payer: Medicaid Other

## 2020-05-22 ENCOUNTER — Inpatient Hospital Stay: Payer: Medicaid Other

## 2020-05-22 ENCOUNTER — Inpatient Hospital Stay (HOSPITAL_BASED_OUTPATIENT_CLINIC_OR_DEPARTMENT_OTHER): Payer: Medicaid Other | Admitting: Nurse Practitioner

## 2020-05-22 ENCOUNTER — Encounter: Payer: Self-pay | Admitting: Nurse Practitioner

## 2020-05-22 ENCOUNTER — Other Ambulatory Visit: Payer: Self-pay

## 2020-05-22 VITALS — BP 124/61 | HR 100 | Temp 96.9°F | Resp 18

## 2020-05-22 VITALS — BP 132/76 | HR 102 | Temp 96.0°F | Resp 18

## 2020-05-22 DIAGNOSIS — E876 Hypokalemia: Secondary | ICD-10-CM | POA: Diagnosis not present

## 2020-05-22 DIAGNOSIS — Z171 Estrogen receptor negative status [ER-]: Secondary | ICD-10-CM | POA: Diagnosis not present

## 2020-05-22 DIAGNOSIS — C50812 Malignant neoplasm of overlapping sites of left female breast: Secondary | ICD-10-CM | POA: Diagnosis not present

## 2020-05-22 DIAGNOSIS — E871 Hypo-osmolality and hyponatremia: Secondary | ICD-10-CM

## 2020-05-22 DIAGNOSIS — Z5111 Encounter for antineoplastic chemotherapy: Secondary | ICD-10-CM | POA: Diagnosis not present

## 2020-05-22 LAB — CBC WITH DIFFERENTIAL/PLATELET
Abs Immature Granulocytes: 1.69 10*3/uL — ABNORMAL HIGH (ref 0.00–0.07)
Basophils Absolute: 0.1 10*3/uL (ref 0.0–0.1)
Basophils Relative: 0 %
Eosinophils Absolute: 0 10*3/uL (ref 0.0–0.5)
Eosinophils Relative: 0 %
HCT: 26.4 % — ABNORMAL LOW (ref 36.0–46.0)
Hemoglobin: 9.3 g/dL — ABNORMAL LOW (ref 12.0–15.0)
Immature Granulocytes: 15 %
Lymphocytes Relative: 12 %
Lymphs Abs: 1.5 10*3/uL (ref 0.7–4.0)
MCH: 30 pg (ref 26.0–34.0)
MCHC: 35.2 g/dL (ref 30.0–36.0)
MCV: 85.2 fL (ref 80.0–100.0)
Monocytes Absolute: 1.6 10*3/uL — ABNORMAL HIGH (ref 0.1–1.0)
Monocytes Relative: 14 %
Neutro Abs: 6.8 10*3/uL (ref 1.7–7.7)
Neutrophils Relative %: 59 %
Platelets: 204 10*3/uL (ref 150–400)
RBC: 3.1 MIL/uL — ABNORMAL LOW (ref 3.87–5.11)
RDW: 12 % (ref 11.5–15.5)
Smear Review: NORMAL
WBC Morphology: 20
WBC: 11.7 10*3/uL — ABNORMAL HIGH (ref 4.0–10.5)
nRBC: 0 % (ref 0.0–0.2)

## 2020-05-22 LAB — COMPREHENSIVE METABOLIC PANEL
ALT: 13 U/L (ref 0–44)
AST: 16 U/L (ref 15–41)
Albumin: 3.6 g/dL (ref 3.5–5.0)
Alkaline Phosphatase: 68 U/L (ref 38–126)
Anion gap: 11 (ref 5–15)
BUN: <5 mg/dL — ABNORMAL LOW (ref 6–20)
CO2: 26 mmol/L (ref 22–32)
Calcium: 8.8 mg/dL — ABNORMAL LOW (ref 8.9–10.3)
Chloride: 86 mmol/L — ABNORMAL LOW (ref 98–111)
Creatinine, Ser: 0.53 mg/dL (ref 0.44–1.00)
GFR, Estimated: >60 mL/min (ref >60)
Glucose, Bld: 86 mg/dL (ref 70–99)
Potassium: 2.9 mmol/L — ABNORMAL LOW (ref 3.5–5.1)
Sodium: 123 mmol/L — ABNORMAL LOW (ref 135–145)
Total Bilirubin: 0.4 mg/dL (ref 0.3–1.2)
Total Protein: 7.5 g/dL (ref 6.5–8.1)

## 2020-05-22 MED ORDER — FLUTICASONE PROPIONATE 50 MCG/ACT NA SUSP: 1.0000 | Freq: Every day | NASAL | 9 refills | Status: DC

## 2020-05-22 MED ORDER — POTASSIUM CHLORIDE CRYS ER 20 MEQ PO TBCR: 20.0000 meq | EXTENDED_RELEASE_TABLET | Freq: Two times a day (BID) | ORAL | 0 refills | Status: DC

## 2020-05-22 MED ORDER — HEPARIN SOD (PORK) LOCK FLUSH 100 UNIT/ML IV SOLN
500.0000 [IU] | Freq: Once | INTRAVENOUS | Status: AC
  Administered 2020-05-22: 15:00:00 500 [IU] via INTRAVENOUS
  Filled 2020-05-22: qty 5

## 2020-05-22 MED ORDER — SODIUM CHLORIDE 0.9% FLUSH
10.0000 mL | INTRAVENOUS | Status: DC | PRN
  Administered 2020-05-22: 10 mL via INTRAVENOUS
  Filled 2020-05-22: qty 10

## 2020-05-22 MED ORDER — POTASSIUM CHLORIDE IN NACL 20-0.9 MEQ/L-% IV SOLN
INTRAVENOUS | Status: DC
  Filled 2020-05-22 (×2): qty 1000

## 2020-05-22 MED ORDER — SODIUM CHLORIDE 1 G PO TABS: 1.0000 g | ORAL_TABLET | Freq: Two times a day (BID) | ORAL | 0 refills | Status: DC

## 2020-05-22 NOTE — Progress Notes (Signed)
Symptom Management [place]  [phone] [phone]  Patient Care Team: Physicians, [place] as PCP - General   Name of the patient: Tamara [name]  WU:[id number]  01/29/1971   Date of visit: 05/22/20  Diagnosis-triple negative breast cancer  Chief complaint/ Reason for visit-sinusitis  Heme/Onc history:  Oncology History Overview Note  # NOV 2021- LEFT BREAST CANCER - [place]; TRIPLE NEGATIVE; Grade of invasive carcinoma: Grade 3; # LYMPH NODE, LEFT AXILLA; [Dr.Cintron]; ULTRASOUND-GUIDED CORE BIOPSY:  - METASTATIC MAMMARY CARCINOMA, AT LEAST 9 MM METASTASIS. ]; DEC 9th PET scan-Intensely hypermetabolic [id number] cm left breast mass compatible with primary left breast malignancy, with left pectoralis muscle involvement posteriorly and cutaneous involvement by the mass. Multiple smaller hypermetabolic satellite malignant nodules scattered throughout the left breast. Bulky hypermetabolic left axillary and left retropectoral nodal metastases.   # 12/10- Carboplatin ONLY;  Taxol hypersensitivity reaction-discontinued  # 12/17- AC #1 q 3 W  # SURVIVORSHIP:   # GENETICS: p  DIAGNOSIS: Triple negative breast cancer  STAGE: Stage III       ;  GOALS: Cure  CURRENT/MOST RECENT THERAPY : AC    Carcinoma of overlapping sites of left breast in female, estrogen receptor negative ([place])  04/24/2020 Initial Diagnosis   Carcinoma of overlapping sites of left breast in female, estrogen receptor negative ([place])   05/04/2020 - 05/04/2020 Chemotherapy   The patient had dexamethasone (DECADRON) 4 MG tablet, 8 mg, Oral, Daily, 1 of 1 cycle, Start date: --, End date: -- palonosetron (ALOXI) injection 0.25 mg, 0.25 mg, Intravenous,  Once, 1 of 12 cycles Administration: 0.25 mg (05/04/2020) CARBOplatin (PARAPLATIN) 220 mg in sodium chloride 0.9 % 250 mL chemo infusion, 220 mg (100 % of original dose 222.4 mg), Intravenous,  Once, 1 of 12 cycles Dose  modification:   (original dose 222.4 mg, Cycle 1) Administration: 220 mg (05/04/2020) PACLitaxel (TAXOL) 132 mg in sodium chloride 0.9 % 250 mL chemo infusion (</= 80mg /m2), 80 mg/m2 = 132 mg, Intravenous,  Once, 1 of 12 cycles Administration: 132 mg (05/04/2020)  for chemotherapy treatment.    05/11/2020 -  Chemotherapy   The patient had dexamethasone (DECADRON) 4 MG tablet, 8 mg, Oral, Daily, 1 of 1 cycle, Start date: --, End date: -- DOXOrubicin (ADRIAMYCIN) chemo injection 100 mg, 60 mg/m2 = 100 mg, Intravenous,  Once, 1 of 4 cycles Administration: 100 mg (05/11/2020) palonosetron (ALOXI) injection 0.25 mg, 0.25 mg, Intravenous,  Once, 1 of 4 cycles Administration: 0.25 mg (05/11/2020) pegfilgrastim-jmdb (FULPHILA) injection 6 mg, 6 mg, Subcutaneous,  Once, 1 of 4 cycles Administration: 6 mg (05/14/2020) cyclophosphamide (CYTOXAN) 1,000 mg in sodium chloride 0.9 % 250 mL chemo infusion, 600 mg/m2 = 1,000 mg, Intravenous,  Once, 1 of 4 cycles Administration: 1,000 mg (05/11/2020) fosaprepitant (EMEND) 150 mg in sodium chloride 0.9 % 145 mL IVPB, 150 mg, Intravenous,  Once, 1 of 4 cycles Administration: 150 mg (05/11/2020)  for chemotherapy treatment.      Interval history-patient is 49 year old female diagnosed with triple negative breast cancer currently receiving chemotherapy who presents to symptom management clinic for complaints of sinus congestion.  Per patient and her sister patient feels fine but they have noted sinus congestion over the past 24 hours.  Otherwise feels well.  No aches or pains. No fevers or chills. No headache. Eating and drinking normally. Per sister, patient drinks large amounts.   ECOG FS:1 - Symptomatic but completely ambulatory  Review of systems- Review of Systems  Constitutional:  Negative for chills, fever, malaise/fatigue and weight loss.  HENT: Negative for hearing loss, nosebleeds, sore throat and tinnitus.   Eyes: Negative for blurred vision and  double vision.  Respiratory: Negative for cough, hemoptysis, shortness of breath and wheezing.   Cardiovascular: Negative for chest pain, palpitations and leg swelling.  Gastrointestinal: Negative for abdominal pain, blood in stool, constipation, diarrhea, melena, nausea and vomiting.  Genitourinary: Negative for dysuria and urgency.  Musculoskeletal: Negative for back pain, falls, joint pain and myalgias.  Skin: Negative for itching and rash.  Neurological: Positive for tremors and weakness. Negative for dizziness, tingling, sensory change, loss of consciousness and headaches.  Endo/Heme/Allergies: Negative for environmental allergies. Does not bruise/bleed easily.  Psychiatric/Behavioral: Negative for depression. The patient is not nervous/anxious and does not have insomnia.      Allergies  Allergen Reactions  . Paclitaxel Anaphylaxis    Hypersensitivity reaction [flushing; shortness of breath; drop in blood pressure; tachycardia]    Past Medical History:  Diagnosis Date  . GERD (gastroesophageal reflux disease)   . IUD (intrauterine device) in place IUD was placed in 2016  . Leiomyoma of uterus   . Schizophrenia ([place])   . Thyroid disease     Past Surgical History:  Procedure Laterality Date  . PORTACATH PLACEMENT Right 05/02/2020   Procedure: INSERTION PORT-A-CATH;  Surgeon: Herbert Pun, MD;  Location: [facility] ORS;  Service: General;  Laterality: Right;    Social History   Socioeconomic History  . Marital status: Single    Spouse name: Not on file  . Number of children: Not on file  . Years of education: Not on file  . Highest education level: Not on file  Occupational History  . Not on file  Tobacco Use  . Smoking status: Never Smoker  . Smokeless tobacco: Never Used  Substance and Sexual Activity  . Alcohol use: No  . Drug use: No  . Sexual activity: Yes    Birth control/protection: Surgical  Other Topics Concern  . Not on file  Social History Narrative   . Not on file   Social Determinants of Health   Financial Resource Strain: Not on file  Food Insecurity: Not on file  Transportation Needs: Not on file  Physical Activity: Not on file  Stress: Not on file  Social Connections: Not on file  Intimate Partner Violence: Not on file   Immunization History  Administered Date(s) Administered  . Influenza, Seasonal, Injecte, Preservative Fre 03/05/2010  . Influenza,inj,Quad PF,6+ Mos 02/13/2015, 02/13/2016, 03/09/2017, 03/02/2018  . Janssen (J&J) SARS-COV-2 Vaccination 08/12/2019  . Tdap 12/03/2010, 01/03/2019     History reviewed. No pertinent family history.   Current Outpatient Medications:  .  acetaminophen (TYLENOL) 650 MG suppository, Place 1 suppository (650 mg total) rectally every 6 (six) hours as needed for fever., Disp: 12 suppository, Rfl: 0 .  BANOPHEN 25 MG capsule, Take 25 mg by mouth every 6 (six) hours as needed for itching., Disp: , Rfl:  .  benztropine (COGENTIN) 0.5 MG tablet, Take 1 tablet (0.5 mg total) by mouth 2 (two) times daily., Disp: 60 tablet, Rfl: 1 .  cetirizine (ZYRTEC) 10 MG tablet, Take 1 tablet (10 mg total) by mouth daily as needed for allergies. (Patient taking differently: Take 10 mg by mouth daily.), Disp: , Rfl:  .  divalproex (DEPAKOTE) 500 MG DR tablet, Take 1 tablet (500 mg total) by mouth every 12 (twelve) hours., Disp: 60 tablet, Rfl: 1 .  haloperidol (HALDOL) 5 MG tablet, Take 1  tablet (5 mg total) by mouth 3 (three) times daily., Disp: 90 tablet, Rfl: 1 .  haloperidol decanoate (HALDOL DECANOATE) 100 MG/ML injection, Inject 100 mg into the muscle every 28 (twenty-eight) days., Disp: , Rfl:  .  hydrOXYzine (ATARAX/VISTARIL) 25 MG tablet, Take 25 mg by mouth 3 (three) times daily., Disp: , Rfl:  .  levothyroxine (SYNTHROID) 88 MCG tablet, Take 1 tablet (88 mcg total) by mouth daily at 6 (six) AM., Disp: 30 tablet, Rfl: 1 .  lidocaine-prilocaine (EMLA) cream, Apply 1 application topically as  needed., Disp: 30 g, Rfl: 3 .  MELATONIN PO, Take 1 tablet by mouth at bedtime as needed (sleep)., Disp: , Rfl:  .  ondansetron (ZOFRAN) 8 MG tablet, One pill every 8 hours as needed for nausea/vomitting. (Patient not taking: Reported on 05/22/2020), Disp: 40 tablet, Rfl: 1 .  prochlorperazine (COMPAZINE) 10 MG tablet, Take 1 tablet (10 mg total) by mouth every 6 (six) hours as needed for nausea or vomiting. (Patient not taking: Reported on 05/22/2020), Disp: 40 tablet, Rfl: 1 No current facility-administered medications for this visit.  Facility-Administered Medications Ordered in Other Visits:  .  heparin lock flush 100 unit/mL, 500 Units, Intravenous, Once, Verlon Au, NP .  sodium chloride flush (NS) 0.9 % injection 10 mL, 10 mL, Intravenous, PRN, Verlon Au, NP, 10 mL at 05/22/20 1258  Physical exam:  Vitals:   05/22/20 1322  BP: 132/76  Pulse: (!) 102  Resp: 18  Temp: (!) 96 F (35.6 C)  TempSrc: Tympanic  SpO2: 100%   Physical Exam Constitutional:      General: She is not in acute distress. HENT:     Head: Normocephalic and atraumatic.     Nose: No rhinorrhea.     Mouth/Throat:     Mouth: Mucous membranes are moist.     Pharynx: Oropharynx is clear.  Eyes:     General: No scleral icterus.    Conjunctiva/sclera: Conjunctivae normal.  Cardiovascular:     Rate and Rhythm: Normal rate and regular rhythm.  Pulmonary:     Effort: Pulmonary effort is normal.     Breath sounds: Normal breath sounds.  Abdominal:     Palpations: Abdomen is soft.     Tenderness: There is no abdominal tenderness.  Musculoskeletal:        General: No signs of injury.     Comments: No aids  Skin:    General: Skin is warm and dry.  Neurological:     Mental Status: She is alert and oriented to person, place, and time.     Motor: No weakness.  Psychiatric:        Mood and Affect: Mood normal.        Behavior: Behavior normal.      CMP Latest Ref Rng & Units 05/22/2020  Glucose  70 - 99 mg/dL 86  BUN 6 - 20 mg/dL <5(L)  Creatinine 0.44 - 1.00 mg/dL 0.53  Sodium 135 - 145 mmol/L 123(L)  Potassium 3.5 - 5.1 mmol/L 2.9(L)  Chloride 98 - 111 mmol/L 86(L)  CO2 22 - 32 mmol/L 26  Calcium 8.9 - 10.3 mg/dL 8.8(L)  Total Protein 6.5 - 8.1 g/dL 7.5  Total Bilirubin 0.3 - 1.2 mg/dL 0.4  Alkaline Phos 38 - 126 U/L 68  AST 15 - 41 U/L 16  ALT 0 - 44 U/L 13   CBC Latest Ref Rng & Units 05/22/2020  WBC 4.0 - 10.5 K/uL 11.7(H)  Hemoglobin 12.0 -  15.0 g/dL 9.3(L)  Hematocrit 36.0 - 46.0 % 26.4(L)  Platelets 150 - 400 K/uL 204    No images are attached to the encounter.  MR Brain W Wo Contrast  Result Date: 05/14/2020 CLINICAL DATA:  Breast cancer.  Staging.  T4 N2 disease. EXAM: MRI HEAD WITHOUT AND WITH CONTRAST TECHNIQUE: Multiplanar, multiecho pulse sequences of the brain and surrounding structures were obtained without and with intravenous contrast. CONTRAST:  [id number]M47249m41Te45n571[place]M467762aAmbr977380m 6Te31n29161[place]M543346aA65mm7Te97n34161Forked[place]M508045m2Te68n41161Pla[place]M60801069m[id number]M683355aAmbr798330m 5Te43n75161Vand[place]M8166aA49mm3Te40n53161TemK[place]M13928aAmb86mr3Te44n70161FayettKentuckyM701468aA49mb9Te74n78161CleK[place]M474071aA40mm7Te29n72161LKentuckyM3580940m64Te71n65161Bluf[place]M66668aAmbr896 S22mo6Te3n2161TobaccKentuckyM15397aAmbr984m63Te80n18161West Ca[place]M578236a31mA2Te20n35161WalnuKentuckyM7524m05Te40n52161BrackeKentuckyM214620aA25mbTe68n17161[place]M6911880ma1Te33n82161[place]M561m66Te57n53161[place]M1310352aA86mb5Te105n64161India[place]M687571m45Te11n8216[place]M86798064ma7Te29n31161EdgKentuckyM6972371m3Te53n61161Wilm[place]69M31aAmElwaRosiland OznUTROL 1 MMOL/ML IV SOLN COMPARISON:  None. FINDINGS: Brain: The brain has a normal appearance without evidence of malformation, atrophy, old or acute small or large vessel infarction, mass lesion, hemorrhage, hydrocephalus or extra-axial collection. After contrast administration, no abnormal enhancement occurs. Vascular: Major vessels at the base of the brain show flow. Venous sinuses appear patent. Skull and upper cervical spine: Normal. Sinuses/Orbits: Clear/normal. Other: None significant. IMPRESSION: Normal examination. No evidence of metastatic disease. Electronically Signed   By: Mark  Shogry M.D.   On: 05/14/2020 07:36   NM Cardiac Muga Rest  Result Date: 05/10/2020 CLINICAL DATA:  Carcinoma of overlapping sites of LEFT breast today female, post chemotherapy evaluation EXAM: NUCLEAR MEDICINE CARDIAC BLOOD POOL IMAGING (MUGA) TECHNIQUE: Cardiac multi-gated acquisition was performed at rest following intravenous injection of Tc-10m labeled red blood cells. RADIOPHARMACEUTICALS:  21.71 mCi  Tc-10m pertechnetate in-vitro labeled red blood cells IV COMPARISON:  None FINDINGS: Calculated LEFT ventricular ejection fraction is 60.7%, normal. Study was obtained at a cardiac rate of 93 bpm. Patient was rhythmic during imaging. Cine analysis of the LEFT ventricle in 3 projections demonstrates normal LV wall motion. IMPRESSION: Normal LEFT ventricular ejection fraction of 60.7%. Normal LV wall motion. Electronically Signed   By: Mark  Boles M.D.   On: 05/10/2020 15:59   NM PET Image Initial (PI) Skull Base To Thigh  Result Date: 05/03/2020 CLINICAL DATA:  Initial treatment strategy for new diagnosis of left breast cancer. EXAM: NUCLEAR MEDICINE PET SKULL BASE TO THIGH TECHNIQUE: 7.4 mCi F-18 FDG was injected intravenously. Full-ring PET imaging was performed from the skull base to thigh after the radiotracer. CT data was obtained and used for attenuation correction and anatomic localization. Fasting blood glucose: 73 mg/dl COMPARISON:  04/04/2020 CT chest, abdomen and pelvis. FINDINGS: Mediastinal blood pool activity: SUV max 1.6 Liver activity: SUV max NA NECK: No hypermetabolic neck lymph nodes. Mildly enlarged 1.0 cm left supraclavicular neck node (series 3/image 60) demonstrates no hypermetabolism. Incidental CT findings: none CHEST: Intensely hypermetabolic large 10.6 x 8.4 cm left breast mass with max SUV 66.8 (series 3/image 88), which demonstrates cutaneous involvement superficially and left pectoralis muscle involvement posteriorly. Multiple hypermetabolic satellite soft tissue nodules throughout the left breast, for example a 1.8 cm outer lower cutaneous left breast lesion with max SUV 35.8 (series 3/image 103). Numerous hypermetabolic bulky left axillary and left retropectoral lymph nodes. Representative 3.1 cm short axis diameter left axillary node with max SUV 34.6 (series 3/image 72). Representative 1.8 cm left retropectoral node with max SUV 17.3 (series 3/image 65). No enlarged or  hypermetabolic right axillary nodes. No hypermetabolic mediastinal or hilar lymph nodes. Previously questioned enlarged left internal mammary lymph node demonstrates no hypermetabolism. No hypermetabolic pulmonary findings. Incidental CT findings: Mild centrilobular and paraseptal emphysema. Mild patchy ground-glass opacities in the peripheral lower lobes bilaterally, not appreciably changed. No significant pulmonary nodules. ABDOMEN/PELVIS: No abnormal hypermetabolic activity within the liver, pancreas, adrenal glands, or spleen. No hypermetabolic lymph nodes in the abdomen or pelvis. Incidental CT findings: Mildly enlarged myomatous uterus, with one  of the uterine fibroids demonstrating calcific degeneration. SKELETON: No focal hypermetabolic activity to suggest skeletal metastasis. Incidental CT findings: none IMPRESSION: 1. Intensely hypermetabolic 10.6 cm left breast mass compatible with primary left breast malignancy, with left pectoralis muscle involvement posteriorly and cutaneous involvement by the mass. Multiple smaller hypermetabolic satellite malignant nodules scattered throughout the left breast. 2. Bulky hypermetabolic left axillary and left retropectoral nodal metastases. 3. No additional sites of hypermetabolic metastatic disease. 4. Nonspecific mild patchy ground-glass opacities in the peripheral lower lobes, unchanged, with a broad differential including nonspecific postinflammatory scarring. Continued chest CT surveillance warranted. 5. Chronic findings include: Emphysema (ICD10-J43.9). Mildly enlarged myomatous uterus. Electronically Signed   By: Delbert Phenix M.D.   On: 05/03/2020 10:57   DG Chest Port 1 View  Result Date: 05/02/2020 CLINICAL DATA:  Port-A-Cath placement, breast cancer EXAM: PORTABLE CHEST 1 VIEW COMPARISON:  04/06/2020 chest radiograph. FINDINGS: Right internal jugular Port-A-Cath terminates in the middle third of the SVC. Stable cardiomediastinal silhouette with normal  heart size. No pneumothorax. No pleural effusion. Lungs appear clear, with no acute consolidative airspace disease and no pulmonary edema. IMPRESSION: Right internal jugular Port-A-Cath terminates in the middle third of the SVC. No pneumothorax. No active cardiopulmonary disease. Electronically Signed   By: Delbert Phenix M.D.   On: 05/02/2020 14:43   DG C-Arm 1-60 Min-No Report  Result Date: 05/02/2020 Fluoroscopy was utilized by the requesting physician.  No radiographic interpretation.    Assessment and plan- Patient is a 49 y.o. female diagnosed with triple negative breast cancer currently undergoing chemotherapy who presents to symptom management clinic for sinusitis.   1.  Sinusitis- etiology unclear. Question side effect of chemo vs allergies. Trial flonase.  2. Hyponatremia- Na 123- worse. Symptomatic mildly. Ok to proceed with iv fluids today as scheduled but then plan for fluid restriction and start sodium supplementation.  3. Hypokalemia- K 2.9. Unclear compliance with potassium. IV potassium in clinic then start KCl 20 meq BID  Plan to recheck labs in 1 week to reevaluate BMP.    Visit Diagnosis 1. Carcinoma of overlapping sites of left breast in female, estrogen receptor negative ([facility])   2. Acute hyponatremia   3. Hypokalemia     Patient expressed understanding and was in agreement with this plan. She also understands that She can call clinic at any time with any questions, concerns, or complaints.   Thank you for allowing me to participate in the care of this very pleasant patient.   Consuello Masse, DNP, AGNP-C [facility] [phone]

## 2020-05-22 NOTE — Progress Notes (Signed)
Pt received IV Potassium and IVF's today. Tolerated well. NP saw pt today. VSS at discharge. Pt ambulatory.

## 2020-05-22 NOTE — Progress Notes (Signed)
Pt and caregiver reports pt having symptoms of sinus infection.

## 2020-05-24 ENCOUNTER — Telehealth: Payer: Self-pay | Admitting: *Deleted

## 2020-05-24 ENCOUNTER — Inpatient Hospital Stay: Payer: Medicaid Other

## 2020-05-24 ENCOUNTER — Encounter: Payer: Self-pay | Admitting: Internal Medicine

## 2020-05-24 NOTE — Telephone Encounter (Signed)
[name] family r/s pt lab apt. Lauren, NP made aware.

## 2020-05-24 NOTE — Telephone Encounter (Signed)
Patient will arrive at 1:45pm for lab check per scheduling

## 2020-05-24 NOTE — Telephone Encounter (Signed)
Patient needs add-on lab encounter today to recheck her potassium. Colette, please contact patient's sister to set up apt today per Leotis Shames

## 2020-05-28 ENCOUNTER — Other Ambulatory Visit: Payer: Self-pay

## 2020-05-28 ENCOUNTER — Inpatient Hospital Stay: Payer: Medicaid Other | Attending: Internal Medicine

## 2020-05-28 DIAGNOSIS — C50812 Malignant neoplasm of overlapping sites of left female breast: Secondary | ICD-10-CM | POA: Insufficient documentation

## 2020-05-28 DIAGNOSIS — Z5111 Encounter for antineoplastic chemotherapy: Secondary | ICD-10-CM | POA: Insufficient documentation

## 2020-05-28 DIAGNOSIS — F209 Schizophrenia, unspecified: Secondary | ICD-10-CM | POA: Insufficient documentation

## 2020-05-28 DIAGNOSIS — K219 Gastro-esophageal reflux disease without esophagitis: Secondary | ICD-10-CM | POA: Insufficient documentation

## 2020-05-28 DIAGNOSIS — Z79899 Other long term (current) drug therapy: Secondary | ICD-10-CM | POA: Insufficient documentation

## 2020-05-28 DIAGNOSIS — E079 Disorder of thyroid, unspecified: Secondary | ICD-10-CM | POA: Insufficient documentation

## 2020-05-28 DIAGNOSIS — E871 Hypo-osmolality and hyponatremia: Secondary | ICD-10-CM | POA: Insufficient documentation

## 2020-05-28 DIAGNOSIS — C773 Secondary and unspecified malignant neoplasm of axilla and upper limb lymph nodes: Secondary | ICD-10-CM | POA: Insufficient documentation

## 2020-05-28 DIAGNOSIS — Z5189 Encounter for other specified aftercare: Secondary | ICD-10-CM | POA: Insufficient documentation

## 2020-05-28 DIAGNOSIS — E876 Hypokalemia: Secondary | ICD-10-CM | POA: Insufficient documentation

## 2020-05-28 DIAGNOSIS — Z171 Estrogen receptor negative status [ER-]: Secondary | ICD-10-CM | POA: Insufficient documentation

## 2020-05-28 DIAGNOSIS — R251 Tremor, unspecified: Secondary | ICD-10-CM | POA: Insufficient documentation

## 2020-05-29 ENCOUNTER — Inpatient Hospital Stay: Payer: Medicaid Other

## 2020-05-29 ENCOUNTER — Other Ambulatory Visit: Payer: Self-pay | Admitting: *Deleted

## 2020-05-29 ENCOUNTER — Other Ambulatory Visit: Payer: Self-pay

## 2020-05-29 ENCOUNTER — Inpatient Hospital Stay (HOSPITAL_BASED_OUTPATIENT_CLINIC_OR_DEPARTMENT_OTHER): Payer: Medicaid Other | Admitting: Nurse Practitioner

## 2020-05-29 VITALS — BP 160/91 | HR 108 | Temp 97.5°F | Resp 18 | Ht 61.0 in

## 2020-05-29 VITALS — BP 160/91 | HR 108

## 2020-05-29 DIAGNOSIS — E871 Hypo-osmolality and hyponatremia: Secondary | ICD-10-CM

## 2020-05-29 DIAGNOSIS — E079 Disorder of thyroid, unspecified: Secondary | ICD-10-CM | POA: Diagnosis not present

## 2020-05-29 DIAGNOSIS — F209 Schizophrenia, unspecified: Secondary | ICD-10-CM | POA: Diagnosis not present

## 2020-05-29 DIAGNOSIS — C773 Secondary and unspecified malignant neoplasm of axilla and upper limb lymph nodes: Secondary | ICD-10-CM | POA: Diagnosis not present

## 2020-05-29 DIAGNOSIS — E876 Hypokalemia: Secondary | ICD-10-CM

## 2020-05-29 DIAGNOSIS — Z171 Estrogen receptor negative status [ER-]: Secondary | ICD-10-CM | POA: Diagnosis not present

## 2020-05-29 DIAGNOSIS — Z5189 Encounter for other specified aftercare: Secondary | ICD-10-CM | POA: Diagnosis not present

## 2020-05-29 DIAGNOSIS — K219 Gastro-esophageal reflux disease without esophagitis: Secondary | ICD-10-CM | POA: Diagnosis not present

## 2020-05-29 DIAGNOSIS — C50812 Malignant neoplasm of overlapping sites of left female breast: Secondary | ICD-10-CM | POA: Diagnosis not present

## 2020-05-29 DIAGNOSIS — Z5111 Encounter for antineoplastic chemotherapy: Secondary | ICD-10-CM | POA: Diagnosis present

## 2020-05-29 DIAGNOSIS — R251 Tremor, unspecified: Secondary | ICD-10-CM

## 2020-05-29 DIAGNOSIS — Z79899 Other long term (current) drug therapy: Secondary | ICD-10-CM | POA: Diagnosis not present

## 2020-05-29 LAB — BASIC METABOLIC PANEL
Anion gap: 12 (ref 5–15)
BUN: <5 mg/dL — ABNORMAL LOW (ref 6–20)
CO2: 23 mmol/L (ref 22–32)
Calcium: 9 mg/dL (ref 8.9–10.3)
Chloride: 91 mmol/L — ABNORMAL LOW (ref 98–111)
Creatinine, Ser: 0.56 mg/dL (ref 0.44–1.00)
GFR, Estimated: >60 mL/min (ref >60)
Glucose, Bld: 143 mg/dL — ABNORMAL HIGH (ref 70–99)
Potassium: 2.9 mmol/L — ABNORMAL LOW (ref 3.5–5.1)
Sodium: 126 mmol/L — ABNORMAL LOW (ref 135–145)

## 2020-05-29 MED ORDER — HEPARIN SOD (PORK) LOCK FLUSH 100 UNIT/ML IV SOLN
500.0000 [IU] | Freq: Once | INTRAVENOUS | Status: AC
  Administered 2020-05-29: 500 [IU] via INTRAVENOUS
  Filled 2020-05-29: qty 5

## 2020-05-29 MED ORDER — SODIUM CHLORIDE 0.9% FLUSH
10.0000 mL | INTRAVENOUS | Status: DC | PRN
  Administered 2020-05-29: 10 mL via INTRAVENOUS
  Filled 2020-05-29: qty 10

## 2020-05-29 NOTE — Progress Notes (Signed)
[facility]  [phone] [phone]  Patient Care Team: Physicians, [place] as PCP - General   Name of the patient: Tamara [name]  [id number]  1970-07-05   Date of visit: 05/29/20  Diagnosis- Breast Cancer  Chief complaint/ Reason for visit- electrolyte abnormality, tremor  Heme/Onc history:  Oncology History Overview Note  # NOV 2021- LEFT BREAST CANCER - [place]; TRIPLE NEGATIVE; Grade of invasive carcinoma: Grade 3; # LYMPH NODE, LEFT AXILLA; [Dr.Cintron]; ULTRASOUND-GUIDED CORE BIOPSY:  - METASTATIC MAMMARY CARCINOMA, AT LEAST 9 MM METASTASIS. ]; DEC 9th PET scan-Intensely hypermetabolic [id number] cm left breast mass compatible with primary left breast malignancy, with left pectoralis muscle involvement posteriorly and cutaneous involvement by the mass. Multiple smaller hypermetabolic satellite malignant nodules scattered throughout the left breast. Bulky hypermetabolic left axillary and left retropectoral nodal metastases.   # 12/10- Carboplatin ONLY;  Taxol hypersensitivity reaction-discontinued  # 12/17- AC #1 q 3 W  # SURVIVORSHIP:   # GENETICS: p  DIAGNOSIS: Triple negative breast cancer  STAGE: Stage III       ;  GOALS: Cure  CURRENT/MOST RECENT THERAPY : AC    Carcinoma of overlapping sites of left breast in female, estrogen receptor negative ([place])  04/24/2020 Initial Diagnosis   Carcinoma of overlapping sites of left breast in female, estrogen receptor negative ([place])   05/04/2020 - 05/04/2020 Chemotherapy   The patient had dexamethasone (DECADRON) 4 MG tablet, 8 mg, Oral, Daily, 1 of 1 cycle, Start date: --, End date: -- palonosetron (ALOXI) injection 0.25 mg, 0.25 mg, Intravenous,  Once, 1 of 12 cycles Administration: 0.25 mg (05/04/2020) CARBOplatin (PARAPLATIN) 220 mg in sodium chloride 0.9 % 250 mL chemo infusion, 220 mg (100 % of original dose 222.4 mg), Intravenous,  Once, 1 of 12  cycles Dose modification:   (original dose 222.4 mg, Cycle 1) Administration: 220 mg (05/04/2020) PACLitaxel (TAXOL) 132 mg in sodium chloride 0.9 % 250 mL chemo infusion (</= 80mg /m2), 80 mg/m2 = 132 mg, Intravenous,  Once, 1 of 12 cycles Administration: 132 mg (05/04/2020)  for chemotherapy treatment.    05/11/2020 -  Chemotherapy   The patient had dexamethasone (DECADRON) 4 MG tablet, 8 mg, Oral, Daily, 1 of 1 cycle, Start date: --, End date: -- DOXOrubicin (ADRIAMYCIN) chemo injection 100 mg, 60 mg/m2 = 100 mg, Intravenous,  Once, 1 of 4 cycles Administration: 100 mg (05/11/2020) palonosetron (ALOXI) injection 0.25 mg, 0.25 mg, Intravenous,  Once, 1 of 4 cycles Administration: 0.25 mg (05/11/2020) pegfilgrastim-jmdb (FULPHILA) injection 6 mg, 6 mg, Subcutaneous,  Once, 1 of 4 cycles Administration: 6 mg (05/14/2020) cyclophosphamide (CYTOXAN) 1,000 mg in sodium chloride 0.9 % 250 mL chemo infusion, 600 mg/m2 = 1,000 mg, Intravenous,  Once, 1 of 4 cycles Administration: 1,000 mg (05/11/2020) fosaprepitant (EMEND) 150 mg in sodium chloride 0.9 % 145 mL IVPB, 150 mg, Intravenous,  Once, 1 of 4 cycles Administration: 150 mg (05/11/2020)  for chemotherapy treatment.      Interval history- Patient is 50 year old female currently receiving treatment for breast cancer who [facility] lab recheck. Previously she had low potassium and sodium levels. Her sister reports compliance with supplementation and fluid restriction. Additionally, sister notices tremors of legs and arms intermittently, started this morning. Occurs at rest. Nothing seems to make better or worse. Hasn't sought evaluation for symptoms previously. Sister rates severity as mild. Patient reports feeling well. Ambulates without assistance.   Review of systems- Review  of Systems  Constitutional: Negative for chills, fever, malaise/fatigue and weight loss.  HENT: Negative for hearing loss, nosebleeds,  sore throat and tinnitus.   Eyes: Negative for blurred vision and double vision.  Respiratory: Negative for cough, hemoptysis, shortness of breath and wheezing.   Cardiovascular: Negative for chest pain, palpitations and leg swelling.  Gastrointestinal: Negative for abdominal pain, blood in stool, constipation, diarrhea, melena, nausea and vomiting.  Genitourinary: Negative for dysuria and urgency.  Musculoskeletal: Negative for back pain, falls, joint pain and myalgias.  Skin: Negative for itching and rash.  Neurological: Positive for tremors. Negative for dizziness, tingling, sensory change, loss of consciousness, weakness and headaches.  Endo/Heme/Allergies: Negative for environmental allergies. Does not bruise/bleed easily.  Psychiatric/Behavioral: Negative for depression. The patient is not nervous/anxious and does not have insomnia.     Allergies  Allergen Reactions  . Paclitaxel Anaphylaxis    Hypersensitivity reaction [flushing; shortness of breath; drop in blood pressure; tachycardia]    Past Medical History:  Diagnosis Date  . GERD (gastroesophageal reflux disease)   . IUD (intrauterine device) in place IUD was placed in 2016  . Leiomyoma of uterus   . Schizophrenia ([place])   . Thyroid disease     Past Surgical History:  Procedure Laterality Date  . PORTACATH PLACEMENT Right 05/02/2020   Procedure: INSERTION PORT-A-CATH;  Surgeon: Herbert Pun, MD;  Location: [facility] ORS;  Service: General;  Laterality: Right;    Social History   Socioeconomic History  . Marital status: Single    Spouse name: Not on file  . Number of children: Not on file  . Years of education: Not on file  . Highest education level: Not on file  Occupational History  . Not on file  Tobacco Use  . Smoking status: Never Smoker  . Smokeless tobacco: Never Used  Substance and Sexual Activity  . Alcohol use: No  . Drug use: No  . Sexual activity: Yes    Birth control/protection: Surgical   Other Topics Concern  . Not on file  Social History Narrative  . Not on file   Social Determinants of Health   Financial Resource Strain: Not on file  Food Insecurity: Not on file  Transportation Needs: Not on file  Physical Activity: Not on file  Stress: Not on file  Social Connections: Not on file  Intimate Partner Violence: Not on file    No family history on file.   Current Outpatient Medications:  .  acetaminophen (TYLENOL) 650 MG suppository, Place 1 suppository (650 mg total) rectally every 6 (six) hours as needed for fever., Disp: 12 suppository, Rfl: 0 .  BANOPHEN 25 MG capsule, Take 25 mg by mouth every 6 (six) hours as needed for itching., Disp: , Rfl:  .  benztropine (COGENTIN) 0.5 MG tablet, Take 1 tablet (0.5 mg total) by mouth 2 (two) times daily., Disp: 60 tablet, Rfl: 1 .  cetirizine (ZYRTEC) 10 MG tablet, Take 1 tablet (10 mg total) by mouth daily as needed for allergies. (Patient taking differently: Take 10 mg by mouth daily.), Disp: , Rfl:  .  divalproex (DEPAKOTE) 500 MG DR tablet, Take 1 tablet (500 mg total) by mouth every 12 (twelve) hours., Disp: 60 tablet, Rfl: 1 .  fluticasone (FLONASE) 50 MCG/ACT nasal spray, Place 1 spray into both nostrils daily., Disp: 11.1 mL, Rfl: 9 .  haloperidol (HALDOL) 5 MG tablet, Take 1 tablet (5 mg total) by mouth 3 (three) times daily., Disp: 90 tablet, Rfl: 1 .  haloperidol decanoate (HALDOL DECANOATE) 100 MG/ML injection, Inject 100 mg into the muscle every 28 (twenty-eight) days., Disp: , Rfl:  .  hydrOXYzine (ATARAX/VISTARIL) 25 MG tablet, Take 25 mg by mouth 3 (three) times daily., Disp: , Rfl:  .  levothyroxine (SYNTHROID) 88 MCG tablet, Take 1 tablet (88 mcg total) by mouth daily at 6 (six) AM., Disp: 30 tablet, Rfl: 1 .  lidocaine-prilocaine (EMLA) cream, Apply 1 application topically as needed., Disp: 30 g, Rfl: 3 .  MELATONIN PO, Take 1 tablet by mouth at bedtime as needed (sleep)., Disp: , Rfl:  .  ondansetron  (ZOFRAN) 8 MG tablet, One pill every 8 hours as needed for nausea/vomitting. (Patient not taking: Reported on 05/22/2020), Disp: 40 tablet, Rfl: 1 .  potassium chloride SA (KLOR-CON) 20 MEQ tablet, Take 1 tablet (20 mEq total) by mouth 2 (two) times daily., Disp: 30 tablet, Rfl: 0 .  prochlorperazine (COMPAZINE) 10 MG tablet, Take 1 tablet (10 mg total) by mouth every 6 (six) hours as needed for nausea or vomiting. (Patient not taking: Reported on 05/22/2020), Disp: 40 tablet, Rfl: 1 .  sodium chloride 1 g tablet, Take 1 tablet (1 g total) by mouth 2 (two) times daily., Disp: 60 tablet, Rfl: 0 No current facility-administered medications for this visit.  Facility-Administered Medications Ordered in Other Visits:  .  sodium chloride flush (NS) 0.9 % injection 10 mL, 10 mL, Intravenous, PRN, Verlon Au, NP, 10 mL at 05/29/20 1450  Physical exam:  Vitals:   05/29/20 1508  BP: (!) 160/91  Pulse: (!) 108  Resp: 18  Temp: (!) 97.5 F (36.4 C)  TempSrc: Tympanic  Height: 5\' 1"  (1.549 m)   Physical Exam   CMP Latest Ref Rng & Units 05/29/2020  Glucose 70 - 99 mg/dL 143(H)  BUN 6 - 20 mg/dL <5(L)  Creatinine 0.44 - 1.00 mg/dL 0.56  Sodium 135 - 145 mmol/L 126(L)  Potassium 3.5 - 5.1 mmol/L 2.9(L)  Chloride 98 - 111 mmol/L 91(L)  CO2 22 - 32 mmol/L 23  Calcium 8.9 - 10.3 mg/dL 9.0  Total Protein 6.5 - 8.1 g/dL -  Total Bilirubin 0.3 - 1.2 mg/dL -  Alkaline Phos 38 - 126 U/L -  AST 15 - 41 U/L -  ALT 0 - 44 U/L -   CBC Latest Ref Rng & Units 05/22/2020  WBC 4.0 - 10.5 K/uL 11.7(H)  Hemoglobin 12.0 - 15.0 g/dL 9.3(L)  Hematocrit 36.0 - 46.0 % 26.4(L)  Platelets 150 - 400 K/uL 204    No images are attached to the encounter.  MR Brain W Wo Contrast  Result Date: 05/14/2020 CLINICAL DATA:  Breast cancer.  Staging.  T4 N2 disease. EXAM: MRI HEAD WITHOUT AND WITH CONTRAST TECHNIQUE: Multiplanar, multiecho pulse sequences of the brain and surrounding structures were obtained without  and with intravenous contrast. CONTRAST:  97mL GADAVIST GADOBUTROL 1 MMOL/ML IV SOLN COMPARISON:  None. FINDINGS: Brain: The brain has a normal appearance without evidence of malformation, atrophy, old or acute small or large vessel infarction, mass lesion, hemorrhage, hydrocephalus or extra-axial collection. After contrast administration, no abnormal enhancement occurs. Vascular: Major vessels at the base of the brain show flow. Venous sinuses appear patent. Skull and upper cervical spine: Normal. Sinuses/Orbits: Clear/normal. Other: None significant. IMPRESSION: Normal examination. No evidence of metastatic disease. Electronically Signed   By: Nelson Chimes M.D.   On: 05/14/2020 07:36   NM Cardiac Muga Rest  Result Date: 05/10/2020 CLINICAL DATA:  Carcinoma of  overlapping sites of LEFT breast today female, post chemotherapy evaluation EXAM: NUCLEAR MEDICINE CARDIAC BLOOD POOL IMAGING (MUGA) TECHNIQUE: Cardiac multi-gated acquisition was performed at rest following intravenous injection of Tc-28m labeled red blood cells. RADIOPHARMACEUTICALS:  21.71 mCi Tc-28m pertechnetate in-vitro labeled red blood cells IV COMPARISON:  None FINDINGS: Calculated LEFT ventricular ejection fraction is 60.7%, normal. Study was obtained at a cardiac rate of 93 bpm. Patient was rhythmic during imaging. Cine analysis of the LEFT ventricle in 3 projections demonstrates normal LV wall motion. IMPRESSION: Normal LEFT ventricular ejection fraction of 60.7%. Normal LV wall motion. Electronically Signed   By: Ulyses Southward M.D.   On: 05/10/2020 15:59   NM PET Image Initial (PI) Skull Base To Thigh  Result Date: 05/03/2020 CLINICAL DATA:  Initial treatment strategy for new diagnosis of left breast cancer. EXAM: NUCLEAR MEDICINE PET SKULL BASE TO THIGH TECHNIQUE: 7.4 mCi F-18 FDG was injected intravenously. Full-ring PET imaging was performed from the skull base to thigh after the radiotracer. CT data was obtained and used for attenuation  correction and anatomic localization. Fasting blood glucose: 73 mg/dl COMPARISON:  00/17/4944 CT chest, abdomen and pelvis. FINDINGS: Mediastinal blood pool activity: SUV max 1.6 Liver activity: SUV max NA NECK: No hypermetabolic neck lymph nodes. Mildly enlarged 1.0 cm left supraclavicular neck node (series 3/image 60) demonstrates no hypermetabolism. Incidental CT findings: none CHEST: Intensely hypermetabolic large 10.6 x 8.4 cm left breast mass with max SUV 66.8 (series 3/image 88), which demonstrates cutaneous involvement superficially and left pectoralis muscle involvement posteriorly. Multiple hypermetabolic satellite soft tissue nodules throughout the left breast, for example a 1.8 cm outer lower cutaneous left breast lesion with max SUV 35.8 (series 3/image 103). Numerous hypermetabolic bulky left axillary and left retropectoral lymph nodes. Representative 3.1 cm short axis diameter left axillary node with max SUV 34.6 (series 3/image 72). Representative 1.8 cm left retropectoral node with max SUV 17.3 (series 3/image 65). No enlarged or hypermetabolic right axillary nodes. No hypermetabolic mediastinal or hilar lymph nodes. Previously questioned enlarged left internal mammary lymph node demonstrates no hypermetabolism. No hypermetabolic pulmonary findings. Incidental CT findings: Mild centrilobular and paraseptal emphysema. Mild patchy ground-glass opacities in the peripheral lower lobes bilaterally, not appreciably changed. No significant pulmonary nodules. ABDOMEN/PELVIS: No abnormal hypermetabolic activity within the liver, pancreas, adrenal glands, or spleen. No hypermetabolic lymph nodes in the abdomen or pelvis. Incidental CT findings: Mildly enlarged myomatous uterus, with one of the uterine fibroids demonstrating calcific degeneration. SKELETON: No focal hypermetabolic activity to suggest skeletal metastasis. Incidental CT findings: none IMPRESSION: 1. Intensely hypermetabolic 10.6 cm left breast  mass compatible with primary left breast malignancy, with left pectoralis muscle involvement posteriorly and cutaneous involvement by the mass. Multiple smaller hypermetabolic satellite malignant nodules scattered throughout the left breast. 2. Bulky hypermetabolic left axillary and left retropectoral nodal metastases. 3. No additional sites of hypermetabolic metastatic disease. 4. Nonspecific mild patchy ground-glass opacities in the peripheral lower lobes, unchanged, with a broad differential including nonspecific postinflammatory scarring. Continued chest CT surveillance warranted. 5. Chronic findings include: Emphysema (ICD10-J43.9). Mildly enlarged myomatous uterus. Electronically Signed   By: Delbert Phenix M.D.   On: 05/03/2020 10:57   DG Chest Port 1 View  Result Date: 05/02/2020 CLINICAL DATA:  Port-A-Cath placement, breast cancer EXAM: PORTABLE CHEST 1 VIEW COMPARISON:  04/06/2020 chest radiograph. FINDINGS: Right internal jugular Port-A-Cath terminates in the middle third of the SVC. Stable cardiomediastinal silhouette with normal heart size. No pneumothorax. No pleural effusion. Lungs appear clear, with no  acute consolidative airspace disease and no pulmonary edema. IMPRESSION: Right internal jugular Port-A-Cath terminates in the middle third of the SVC. No pneumothorax. No active cardiopulmonary disease. Electronically Signed   By: Ilona Sorrel M.D.   On: 05/02/2020 14:43   DG C-Arm 1-60 Min-No Report  Result Date: 05/02/2020 Fluoroscopy was utilized by the requesting physician.  No radiographic interpretation.    Assessment and plan- Patient is a 50 y.o. female currently undergoing chemotherapy for breast cancer who [facility] for electrolyte disturbance and tremor.    1. Shaking/Tremor- etiology unclear. Unlikely electrolytes as they have improved. Question EPS from psych meds. Discussed with patient and guardian. Will refer to neurology for evaluation.   2.  Hyponatremia-question SIADH etiology with platinum based therapy though water intoxication also possibility.  Improved with sodium supplementation.  Continue water restriction and encouraged compliance with sodium tablets.  3.  Hypokalemia-suspect poor oral intake. Continue supplementation as indicated.   Return to clinic if symptoms don't improve or worsen in the interim.   Visit Diagnosis 1. Tremors of nervous system   2. Hyponatremia   3. Hypokalemia     Patient expressed understanding and was in agreement with this plan. She also understands that She can call clinic at any time with any questions, concerns, or complaints.   Thank you for allowing me to participate in the care of this very pleasant patient.   Beckey Rutter, DNP, AGNP-C [facility]

## 2020-05-29 NOTE — Progress Notes (Signed)
Patient presented to clinic for lab only. Patient chose to return to clinic today for lab redraw. Per lab, patient was unable to get a blood return peripherally. Patient declined further lab draws attempts yesterday. RN notified that patient remained a difficult peripheral stick today. RN offered to access labs via port. Discussed with patient/patient's sister and lab the importance of obtaining labs today due to previous critical potassium level. Patient is taking her oral potassium and sodium tablets per patient's sister. Patient is experiencing chills/tremors/shakes which started this morning. Patient is eating and drinking. She has an excellent appetite. Reports minor fatigue. She is ambulatory. She walked into the clinic on her own. No falls reported by patient/family. No episodes of N&V or Diarrhea.

## 2020-05-31 ENCOUNTER — Encounter: Payer: Self-pay | Admitting: Internal Medicine

## 2020-05-31 ENCOUNTER — Telehealth: Payer: Self-pay | Admitting: *Deleted

## 2020-05-31 NOTE — Telephone Encounter (Signed)
Neurology referral faxed to Dr. Sherryll Burger for work up -tremors, unknown etiology.

## 2020-06-01 ENCOUNTER — Inpatient Hospital Stay: Payer: Medicaid Other | Admitting: Hospice and Palliative Medicine

## 2020-06-04 ENCOUNTER — Inpatient Hospital Stay: Payer: Medicaid Other

## 2020-06-04 ENCOUNTER — Encounter: Payer: Self-pay | Admitting: Internal Medicine

## 2020-06-04 ENCOUNTER — Other Ambulatory Visit: Payer: Self-pay

## 2020-06-04 ENCOUNTER — Inpatient Hospital Stay (HOSPITAL_BASED_OUTPATIENT_CLINIC_OR_DEPARTMENT_OTHER): Payer: Medicaid Other | Admitting: Internal Medicine

## 2020-06-04 DIAGNOSIS — C50812 Malignant neoplasm of overlapping sites of left female breast: Secondary | ICD-10-CM

## 2020-06-04 DIAGNOSIS — Z5111 Encounter for antineoplastic chemotherapy: Secondary | ICD-10-CM | POA: Diagnosis not present

## 2020-06-04 DIAGNOSIS — Z171 Estrogen receptor negative status [ER-]: Secondary | ICD-10-CM | POA: Diagnosis not present

## 2020-06-04 DIAGNOSIS — Z7189 Other specified counseling: Secondary | ICD-10-CM

## 2020-06-04 LAB — CBC WITH DIFFERENTIAL/PLATELET
Abs Immature Granulocytes: 0.28 10*3/uL — ABNORMAL HIGH (ref 0.00–0.07)
Basophils Absolute: 0.1 10*3/uL (ref 0.0–0.1)
Basophils Relative: 1 %
Eosinophils Absolute: 0 10*3/uL (ref 0.0–0.5)
Eosinophils Relative: 0 %
HCT: 29.1 % — ABNORMAL LOW (ref 36.0–46.0)
Hemoglobin: 10.3 g/dL — ABNORMAL LOW (ref 12.0–15.0)
Immature Granulocytes: 3 %
Lymphocytes Relative: 15 %
Lymphs Abs: 1.6 10*3/uL (ref 0.7–4.0)
MCH: 31 pg (ref 26.0–34.0)
MCHC: 35.4 g/dL (ref 30.0–36.0)
MCV: 87.7 fL (ref 80.0–100.0)
Monocytes Absolute: 2.2 10*3/uL — ABNORMAL HIGH (ref 0.1–1.0)
Monocytes Relative: 21 %
Neutro Abs: 6.4 10*3/uL (ref 1.7–7.7)
Neutrophils Relative %: 60 %
Platelets: 370 10*3/uL (ref 150–400)
RBC: 3.32 MIL/uL — ABNORMAL LOW (ref 3.87–5.11)
RDW: 14.7 % (ref 11.5–15.5)
WBC: 10.6 10*3/uL — ABNORMAL HIGH (ref 4.0–10.5)
nRBC: 0 % (ref 0.0–0.2)

## 2020-06-04 LAB — COMPREHENSIVE METABOLIC PANEL
ALT: 8 U/L (ref 0–44)
AST: 15 U/L (ref 15–41)
Albumin: 3.5 g/dL (ref 3.5–5.0)
Alkaline Phosphatase: 53 U/L (ref 38–126)
Anion gap: 10 (ref 5–15)
BUN: <5 mg/dL — ABNORMAL LOW (ref 6–20)
CO2: 24 mmol/L (ref 22–32)
Calcium: 9.2 mg/dL (ref 8.9–10.3)
Chloride: 96 mmol/L — ABNORMAL LOW (ref 98–111)
Creatinine, Ser: 0.49 mg/dL (ref 0.44–1.00)
GFR, Estimated: >60 mL/min (ref >60)
Glucose, Bld: 86 mg/dL (ref 70–99)
Potassium: 4.1 mmol/L (ref 3.5–5.1)
Sodium: 130 mmol/L — ABNORMAL LOW (ref 135–145)
Total Bilirubin: 0.5 mg/dL (ref 0.3–1.2)
Total Protein: 7.3 g/dL (ref 6.5–8.1)

## 2020-06-04 MED ORDER — SODIUM CHLORIDE 0.9% FLUSH
10.0000 mL | Freq: Once | INTRAVENOUS | Status: AC
  Administered 2020-06-04: 10 mL via INTRAVENOUS
  Filled 2020-06-04: qty 10

## 2020-06-04 MED ORDER — PALONOSETRON HCL INJECTION 0.25 MG/5ML
0.2500 mg | Freq: Once | INTRAVENOUS | Status: AC
Start: 2020-06-04 — End: 2020-06-04
  Administered 2020-06-04: 0.25 mg via INTRAVENOUS
  Filled 2020-06-04: qty 5

## 2020-06-04 MED ORDER — SODIUM CHLORIDE 0.9 % IV SOLN
Freq: Once | INTRAVENOUS | Status: AC
  Filled 2020-06-04: qty 250

## 2020-06-04 MED ORDER — FOSAPREPITANT DIMEGLUMINE INJECTION 150 MG
150.0000 mg | Freq: Once | INTRAVENOUS | Status: AC
  Administered 2020-06-04: 150 mg via INTRAVENOUS
  Filled 2020-06-04: qty 150

## 2020-06-04 MED ORDER — DOXORUBICIN HCL CHEMO IV INJECTION 2 MG/ML
60.0000 mg/m2 | Freq: Once | INTRAVENOUS | Status: AC
  Administered 2020-06-04: 100 mg via INTRAVENOUS
  Filled 2020-06-04: qty 50

## 2020-06-04 MED ORDER — HEPARIN SOD (PORK) LOCK FLUSH 100 UNIT/ML IV SOLN
500.0000 [IU] | Freq: Once | INTRAVENOUS | Status: AC
  Administered 2020-06-04: 500 [IU] via INTRAVENOUS
  Filled 2020-06-04: qty 5

## 2020-06-04 MED ORDER — SODIUM CHLORIDE 0.9 % IV SOLN
600.0000 mg/m2 | Freq: Once | INTRAVENOUS | Status: AC
  Administered 2020-06-04: 1000 mg via INTRAVENOUS
  Filled 2020-06-04: qty 50

## 2020-06-04 MED ORDER — SODIUM CHLORIDE 0.9 % IV SOLN
10.0000 mg | Freq: Once | INTRAVENOUS | Status: AC
  Administered 2020-06-04: 10 mg via INTRAVENOUS
  Filled 2020-06-04: qty 10

## 2020-06-04 NOTE — Assessment & Plan Note (Addendum)
#  Left breast cancer T4 N2 M0-stage III triple negative; DEC 9th PET scan-I locally advanced breast cancer negative for distant site disease.  Cycle #1 Taxol discontinued after hypersensitive reaction.  s/p 1 dose of carboplatin.  Currently on Adriamycin Cytoxan every 3 weeks; partial response noted-see images above.  # Proceed with Adriamycin Cytoxan chemotherapy cycle #2 ;Labs today reviewed;  acceptable for treatment today.   # Hypokalemia- K-2.9; 1 week  ago; K today- is 4/0- monitor closely.   # schizophrenia:SATBLE;  Follows up with [facility] [phone]; Dr.Ahluwalia].    #Tremors question reactive psychiatric medication.  awaiting neurology evaluation.  # DISPOSITION: # chemo today; # follow up-labs- cbc/cmp; MD-  in 10 days; possible IVFs over 1 hour # Follow up in 3 weeks- MD; labs- cbc/cmp; chemo Adria-Cytoxan; D-2 fulphila; Dr.B

## 2020-06-04 NOTE — Progress Notes (Signed)
Blood return noted before, ever 3cc during and after Adriamycin push.   Pt tolerated infusion well. Pt stable at discharge.

## 2020-06-04 NOTE — Progress Notes (Signed)
[facility] CONSULT NOTE  Patient Care Team: Physicians, [place] as PCP - General Vladimir Crofts, MD as Consulting Physician (Neurology) Cammie Sickle, MD as Consulting Physician (Internal Medicine)  CHIEF COMPLAINTS/PURPOSE OF CONSULTATION: Breast cancer  #  Oncology History Overview Note  # NOV 2021- LEFT BREAST CANCER - [place]; TRIPLE NEGATIVE; Grade of invasive carcinoma: Grade 3; # LYMPH NODE, LEFT AXILLA; [Dr.Cintron]; ULTRASOUND-GUIDED CORE BIOPSY:  - METASTATIC MAMMARY CARCINOMA, AT LEAST 9 MM METASTASIS. ]; DEC 9th PET scan-Intensely hypermetabolic 41.3 cm left breast mass compatible with primary left breast malignancy, with left pectoralis muscle involvement posteriorly and cutaneous involvement by the mass. Multiple smaller hypermetabolic satellite malignant nodules scattered throughout the left breast. Bulky hypermetabolic left axillary and left retropectoral nodal metastases.   # 12/10- Carboplatin ONLY;  Taxol hypersensitivity reaction-discontinued  # 12/17- AC #1 q 3 W  # SURVIVORSHIP:   # GENETICS: p  DIAGNOSIS: Triple negative breast cancer  STAGE: Stage III       ;  GOALS: Cure  CURRENT/MOST RECENT THERAPY : AC    Carcinoma of overlapping sites of left breast in female, estrogen receptor negative ([place])  04/24/2020 Initial Diagnosis   Carcinoma of overlapping sites of left breast in female, estrogen receptor negative ([place])   05/04/2020 - 05/04/2020 Chemotherapy   The patient had dexamethasone (DECADRON) 4 MG tablet, 8 mg, Oral, Daily, 1 of 1 cycle, Start date: --, End date: -- palonosetron (ALOXI) injection 0.25 mg, 0.25 mg, Intravenous,  Once, 1 of 12 cycles Administration: 0.25 mg (05/04/2020) CARBOplatin (PARAPLATIN) 220 mg in sodium chloride 0.9 % 250 mL chemo infusion, 220 mg (100 % of original dose 222.4 mg), Intravenous,  Once, 1 of 12 cycles Dose modification:   (original dose 222.4 mg, Cycle 1) Administration: 220 mg  (05/04/2020) PACLitaxel (TAXOL) 132 mg in sodium chloride 0.9 % 250 mL chemo infusion (</= 80mg /m2), 80 mg/m2 = 132 mg, Intravenous,  Once, 1 of 12 cycles Administration: 132 mg (05/04/2020)  for chemotherapy treatment.    05/11/2020 -  Chemotherapy    Patient is on Treatment Plan: BREAST NEO-ADJUVANT AC Q21D         HISTORY OF PRESENTING ILLNESS: Patient is accompanied by her Vernie Murders. Theodosia Paling 50 y.o.  female locally advanced triple negative breast cancer T4 N2; currently on neoadjuvant chemotherapy with Adriamycin Cytoxan is here for follow-up.  Patient is currently status post cycle #1 approximately 3 weeks ago.  In the interim patient was evaluated in the symptom management clinic for tremors.  Has been referred to neurology.  Awaiting appointment.  Denies any worsening tremors at this time.  No nausea no vomiting.  No chest pain or shortness of breath or cough.  No nausea no vomiting.  Appetite is good.   Review of Systems  Unable to perform ROS: Psychiatric disorder     MEDICAL HISTORY:  Past Medical History:  Diagnosis Date  . GERD (gastroesophageal reflux disease)   . IUD (intrauterine device) in place IUD was placed in 2016  . Leiomyoma of uterus   . Schizophrenia ([place])   . Thyroid disease     SURGICAL HISTORY: Past Surgical History:  Procedure Laterality Date  . PORTACATH PLACEMENT Right 05/02/2020   Procedure: INSERTION PORT-A-CATH;  Surgeon: Herbert Pun, MD;  Location: [facility] ORS;  Service: General;  Laterality: Right;    SOCIAL HISTORY: Social History   Socioeconomic History  . Marital status: Single    Spouse name: Not on file  .  Number of children: Not on file  . Years of education: Not on file  . Highest education level: Not on file  Occupational History  . Not on file  Tobacco Use  . Smoking status: Never Smoker  . Smokeless tobacco: Never Used  Substance and Sexual Activity  . Alcohol use: No  . Drug use: No  . Sexual  activity: Yes    Birth control/protection: Surgical  Other Topics Concern  . Not on file  Social History Narrative  . Not on file   Social Determinants of Health   Financial Resource Strain: Not on file  Food Insecurity: Not on file  Transportation Needs: Not on file  Physical Activity: Not on file  Stress: Not on file  Social Connections: Not on file  Intimate Partner Violence: Not on file    FAMILY HISTORY: No family history on file.  ALLERGIES:  is allergic to paclitaxel.  MEDICATIONS:  Current Outpatient Medications  Medication Sig Dispense Refill  . acetaminophen (TYLENOL) 650 MG suppository Place 1 suppository (650 mg total) rectally every 6 (six) hours as needed for fever. 12 suppository 0  . BANOPHEN 25 MG capsule Take 25 mg by mouth every 6 (six) hours as needed for itching.    . benztropine (COGENTIN) 0.5 MG tablet Take 1 tablet (0.5 mg total) by mouth 2 (two) times daily. 60 tablet 1  . cetirizine (ZYRTEC) 10 MG tablet Take 1 tablet (10 mg total) by mouth daily as needed for allergies. (Patient taking differently: Take 10 mg by mouth daily.)    . divalproex (DEPAKOTE) 500 MG DR tablet Take 1 tablet (500 mg total) by mouth every 12 (twelve) hours. 60 tablet 1  . fluticasone (FLONASE) 50 MCG/ACT nasal spray Place 1 spray into both nostrils daily. 11.1 mL 9  . haloperidol (HALDOL) 5 MG tablet Take 1 tablet (5 mg total) by mouth 3 (three) times daily. 90 tablet 1  . haloperidol decanoate (HALDOL DECANOATE) 100 MG/ML injection Inject 100 mg into the muscle every 28 (twenty-eight) days.    . hydrOXYzine (ATARAX/VISTARIL) 25 MG tablet Take 25 mg by mouth 3 (three) times daily.    Marland Kitchen levothyroxine (SYNTHROID) 88 MCG tablet Take 1 tablet (88 mcg total) by mouth daily at 6 (six) AM. 30 tablet 1  . lidocaine-prilocaine (EMLA) cream Apply 1 application topically as needed. 30 g 3  . MELATONIN PO Take 1 tablet by mouth at bedtime as needed (sleep).    . potassium chloride SA  (KLOR-CON) 20 MEQ tablet Take 1 tablet (20 mEq total) by mouth 2 (two) times daily. 30 tablet 0  . sodium chloride 1 g tablet Take 1 tablet (1 g total) by mouth 2 (two) times daily. 60 tablet 0  . ondansetron (ZOFRAN) 8 MG tablet One pill every 8 hours as needed for nausea/vomitting. (Patient not taking: No sig reported) 40 tablet 1  . prochlorperazine (COMPAZINE) 10 MG tablet Take 1 tablet (10 mg total) by mouth every 6 (six) hours as needed for nausea or vomiting. (Patient not taking: No sig reported) 40 tablet 1   No current facility-administered medications for this visit.   Facility-Administered Medications Ordered in Other Visits  Medication Dose Route Frequency Provider Last Rate Last Admin  . cyclophosphamide (CYTOXAN) 1,000 mg in sodium chloride 0.9 % 250 mL chemo infusion  600 mg/m2 (Treatment Plan Recorded) Intravenous Once Charlaine Dalton R, MD      . dexamethasone (DECADRON) 10 mg in sodium chloride 0.9 % 50 mL IVPB  10 mg Intravenous Once Charlaine Dalton R, MD      . DOXOrubicin (ADRIAMYCIN) chemo injection 100 mg  60 mg/m2 (Treatment Plan Recorded) Intravenous Once Charlaine Dalton R, MD      . fosaprepitant (EMEND) 150 mg in sodium chloride 0.9 % 145 mL IVPB  150 mg Intravenous Once Charlaine Dalton R, MD      . heparin lock flush 100 unit/mL  500 Units Intravenous Once Charlaine Dalton R, MD          .  PHYSICAL EXAMINATION: ECOG PERFORMANCE STATUS: 1 - Symptomatic but completely ambulatory  Vitals:   06/04/20 0917  BP: (!) 107/53  Pulse: 93  Resp: 16  Temp: (!) 97.5 F (36.4 C)  SpO2: 100%   Filed Weights   06/04/20 0917  Weight: 152 lb 12.8 oz (69.3 kg)    Physical Exam Constitutional:      Comments: Accompanied by sister.  Ambulating independently.  HENT:     Head: Normocephalic and atraumatic.     Mouth/Throat:     Pharynx: No oropharyngeal exudate.  Eyes:     Pupils: Pupils are equal, round, and reactive to light.  Cardiovascular:      Rate and Rhythm: Normal rate and regular rhythm.  Pulmonary:     Effort: No respiratory distress.     Breath sounds: No wheezing.  Abdominal:     General: Bowel sounds are normal. There is no distension.     Palpations: Abdomen is soft. There is no mass.     Tenderness: There is no abdominal tenderness. There is no guarding or rebound.  Musculoskeletal:        General: No tenderness. Normal range of motion.     Cervical back: Normal range of motion and neck supple.  Skin:    General: Skin is warm.     Comments: Large left-sided breast mass noted.  However significant improvement of the breast edema; swelling.  Noted to have left axillary node-see imaging below.  Neurological:     Mental Status: She is alert and oriented to person, place, and time.  Psychiatric:        Mood and Affect: Affect normal.     Comments: Poor insight/tangential thoughts.  Unable to make a coherent conversation-underlying schizophrenia.      LABORATORY DATA:  I have reviewed the data as listed Lab Results  Component Value Date   WBC 10.6 (H) 06/04/2020   HGB 10.3 (L) 06/04/2020   HCT 29.1 (L) 06/04/2020   MCV 87.7 06/04/2020   PLT 370 06/04/2020   Recent Labs    06/05/19 1806 06/05/19 2117 06/07/19 1145 04/03/20 1540 05/11/20 0914 05/22/20 1305 05/29/20 1424 06/04/20 0856  NA 127* 132* 135   < > 125* 123* 126* 130*  K 4.8 3.5 4.4   < > 3.9 2.9* 2.9* 4.1  CL 93* 101 98   < > 88* 86* 91* 96*  CO2 26 24 26    < > 28 26 23 24   GLUCOSE 90 167* 97   < > 81 86 143* 86  BUN <5* <5* 8   < > <5* <5* <5* <5*  CREATININE 0.51 0.51 0.76   < > 0.48 0.53 0.56 0.49  CALCIUM 8.7* 8.1* 10.1   < > 8.9 8.8* 9.0 9.2  GFRNONAA >60 >60 >60   < > >60 >60 >60 >60  GFRAA >60 >60 >60  --   --   --   --   --   PROT  7.1  --  7.6   < > 7.2 7.5  --  7.3  ALBUMIN 3.6  --  3.5   < > 3.2* 3.6  --  3.5  AST 15  --  15   < > 20 16  --  15  ALT 7  --  8   < > 15 13  --  8  ALKPHOS 64  --  66   < > 50 68  --  53  BILITOT  0.6  --  0.4   < > 0.5 0.4  --  0.5   < > = values in this interval not displayed.    RADIOGRAPHIC STUDIES: I have personally reviewed the radiological images as listed and agreed with the findings in the report. MR Brain W Wo Contrast  Result Date: 05/14/2020 CLINICAL DATA:  Breast cancer.  Staging.  T4 N2 disease. EXAM: MRI HEAD WITHOUT AND WITH CONTRAST TECHNIQUE: Multiplanar, multiecho pulse sequences of the brain and surrounding structures were obtained without and with intravenous contrast. CONTRAST:  63mL GADAVIST GADOBUTROL 1 MMOL/ML IV SOLN COMPARISON:  None. FINDINGS: Brain: The brain has a normal appearance without evidence of malformation, atrophy, old or acute small or large vessel infarction, mass lesion, hemorrhage, hydrocephalus or extra-axial collection. After contrast administration, no abnormal enhancement occurs. Vascular: Major vessels at the base of the brain show flow. Venous sinuses appear patent. Skull and upper cervical spine: Normal. Sinuses/Orbits: Clear/normal. Other: None significant. IMPRESSION: Normal examination. No evidence of metastatic disease. Electronically Signed   By: Nelson Chimes M.D.   On: 05/14/2020 07:36   NM Cardiac [facility] [facility]  Result Date: 05/10/2020 CLINICAL DATA:  Carcinoma of overlapping sites of LEFT breast today female, post chemotherapy evaluation EXAM: NUCLEAR MEDICINE CARDIAC BLOOD POOL IMAGING (MUGA) TECHNIQUE: Cardiac multi-gated acquisition was performed at rest following intravenous injection of Tc-81m labeled red blood cells. RADIOPHARMACEUTICALS:  21.71 mCi Tc-27m pertechnetate in-vitro labeled red blood cells IV COMPARISON:  None FINDINGS: Calculated LEFT ventricular ejection fraction is 60.7%, normal. Study was obtained at a cardiac rate of 93 bpm. Patient was rhythmic during imaging. Cine analysis of the LEFT ventricle in 3 projections demonstrates normal LV wall motion. IMPRESSION: Normal LEFT ventricular ejection fraction of 60.7%. Normal  LV wall motion. Electronically Signed   By: Lavonia Dana M.D.   On: 05/10/2020 15:59      06/04/2020-       ASSESSMENT & PLAN:   Carcinoma of overlapping sites of left breast in female, estrogen receptor negative ([place]) #Left breast cancer T4 N2 M0-stage III triple negative; DEC 9th PET scan-I locally advanced breast cancer negative for distant site disease.  Cycle #1 Taxol discontinued after hypersensitive reaction.  s/p 1 dose of carboplatin.  Currently on Adriamycin Cytoxan every 3 weeks; partial response noted-see images above.  # Proceed with Adriamycin Cytoxan chemotherapy cycle #2 ;Labs today reviewed;  acceptable for treatment today.   # Hypokalemia- K-2.9; 1 week  ago; K today- is 4/0- monitor closely.   # schizophrenia:SATBLE;  Follows up with [facility] [phone]; Dr.Ahluwalia].    #Tremors question reactive psychiatric medication.  awaiting neurology evaluation.  # DISPOSITION: # chemo today; # follow up-labs- cbc/cmp; MD-  in 10 days; possible IVFs over 1 hour # Follow up in 3 weeks- MD; labs- cbc/cmp; chemo Adria-Cytoxan; D-2 fulphila; Dr.B  All questions were answered. The patient knows to call the clinic with any problems, questions or concerns.    Cammie Sickle, MD 06/04/2020 10:23 AM

## 2020-06-05 ENCOUNTER — Inpatient Hospital Stay: Payer: Medicaid Other

## 2020-06-05 DIAGNOSIS — C50812 Malignant neoplasm of overlapping sites of left female breast: Secondary | ICD-10-CM

## 2020-06-05 DIAGNOSIS — Z7189 Other specified counseling: Secondary | ICD-10-CM

## 2020-06-05 DIAGNOSIS — Z5111 Encounter for antineoplastic chemotherapy: Secondary | ICD-10-CM | POA: Diagnosis not present

## 2020-06-05 DIAGNOSIS — Z171 Estrogen receptor negative status [ER-]: Secondary | ICD-10-CM

## 2020-06-05 MED ORDER — PEGFILGRASTIM-JMDB 6 MG/0.6ML ~~LOC~~ SOSY
6.0000 mg | PREFILLED_SYRINGE | Freq: Once | SUBCUTANEOUS | Status: AC
  Administered 2020-06-05: 6 mg via SUBCUTANEOUS
  Filled 2020-06-05: qty 0.6

## 2020-06-08 ENCOUNTER — Inpatient Hospital Stay (HOSPITAL_BASED_OUTPATIENT_CLINIC_OR_DEPARTMENT_OTHER): Payer: Medicaid Other | Admitting: Hospice and Palliative Medicine

## 2020-06-08 DIAGNOSIS — C50812 Malignant neoplasm of overlapping sites of left female breast: Secondary | ICD-10-CM

## 2020-06-08 DIAGNOSIS — Z171 Estrogen receptor negative status [ER-]: Secondary | ICD-10-CM

## 2020-06-08 NOTE — Progress Notes (Signed)
Virtual visit completed with patient's sister.  Patient was not present as she was out with mother.  Sister reports that patient is doing well.  She denies any symptomatic concerns at present.  Oral intake is good.  Patient appears to be tolerating treatments well.  We discussed upcoming appointments and clinical schedule.  We will plan to see patient in clinic on 1/31.

## 2020-06-09 ENCOUNTER — Other Ambulatory Visit: Payer: Self-pay | Admitting: Nurse Practitioner

## 2020-06-14 ENCOUNTER — Inpatient Hospital Stay (HOSPITAL_BASED_OUTPATIENT_CLINIC_OR_DEPARTMENT_OTHER): Payer: Medicaid Other | Admitting: Internal Medicine

## 2020-06-14 ENCOUNTER — Other Ambulatory Visit: Payer: Self-pay

## 2020-06-14 ENCOUNTER — Inpatient Hospital Stay: Payer: Medicaid Other

## 2020-06-14 ENCOUNTER — Encounter: Payer: Self-pay | Admitting: Internal Medicine

## 2020-06-14 VITALS — BP 117/60 | HR 95 | Temp 99.0°F | Resp 16

## 2020-06-14 DIAGNOSIS — Z171 Estrogen receptor negative status [ER-]: Secondary | ICD-10-CM

## 2020-06-14 DIAGNOSIS — Z5111 Encounter for antineoplastic chemotherapy: Secondary | ICD-10-CM | POA: Diagnosis not present

## 2020-06-14 DIAGNOSIS — C50812 Malignant neoplasm of overlapping sites of left female breast: Secondary | ICD-10-CM

## 2020-06-14 DIAGNOSIS — E876 Hypokalemia: Secondary | ICD-10-CM

## 2020-06-14 LAB — COMPREHENSIVE METABOLIC PANEL
ALT: 7 U/L (ref 0–44)
AST: 18 U/L (ref 15–41)
Albumin: 3.5 g/dL (ref 3.5–5.0)
Alkaline Phosphatase: 64 U/L (ref 38–126)
Anion gap: 6 (ref 5–15)
BUN: <5 mg/dL — ABNORMAL LOW (ref 6–20)
CO2: 28 mmol/L (ref 22–32)
Calcium: 8.7 mg/dL — ABNORMAL LOW (ref 8.9–10.3)
Chloride: 92 mmol/L — ABNORMAL LOW (ref 98–111)
Creatinine, Ser: 0.68 mg/dL (ref 0.44–1.00)
GFR, Estimated: >60 mL/min (ref >60)
Glucose, Bld: 130 mg/dL — ABNORMAL HIGH (ref 70–99)
Potassium: 3 mmol/L — ABNORMAL LOW (ref 3.5–5.1)
Sodium: 126 mmol/L — ABNORMAL LOW (ref 135–145)
Total Bilirubin: 0.2 mg/dL — ABNORMAL LOW (ref 0.3–1.2)
Total Protein: 7.1 g/dL (ref 6.5–8.1)

## 2020-06-14 LAB — CBC WITH DIFFERENTIAL/PLATELET
Abs Immature Granulocytes: 0 K/uL (ref 0.00–0.07)
Band Neutrophils: 9 %
Basophils Absolute: 0 K/uL (ref 0.0–0.1)
Basophils Relative: 0 %
Eosinophils Absolute: 0 K/uL (ref 0.0–0.5)
Eosinophils Relative: 0 %
HCT: 24.8 % — ABNORMAL LOW (ref 36.0–46.0)
Hemoglobin: 9.1 g/dL — ABNORMAL LOW (ref 12.0–15.0)
Lymphocytes Relative: 48 %
Lymphs Abs: 0.8 K/uL (ref 0.7–4.0)
MCH: 31.7 pg (ref 26.0–34.0)
MCHC: 36.7 g/dL — ABNORMAL HIGH (ref 30.0–36.0)
MCV: 86.4 fL (ref 80.0–100.0)
Metamyelocytes Relative: 2 %
Monocytes Absolute: 0.2 K/uL (ref 0.1–1.0)
Monocytes Relative: 14 %
Myelocytes: 1 %
Neutro Abs: 0.6 K/uL — ABNORMAL LOW (ref 1.7–7.7)
Neutrophils Relative %: 26 %
Platelets: 69 K/uL — ABNORMAL LOW (ref 150–400)
RBC: 2.87 MIL/uL — ABNORMAL LOW (ref 3.87–5.11)
RDW: 13.8 % (ref 11.5–15.5)
Smear Review: NORMAL
WBC: 1.6 K/uL — ABNORMAL LOW (ref 4.0–10.5)
nRBC: 0 % (ref 0.0–0.2)

## 2020-06-14 MED ORDER — HEPARIN SOD (PORK) LOCK FLUSH 100 UNIT/ML IV SOLN
500.0000 [IU] | Freq: Once | INTRAVENOUS | Status: AC
  Administered 2020-06-14: 500 [IU] via INTRAVENOUS
  Filled 2020-06-14: qty 5

## 2020-06-14 MED ORDER — POTASSIUM CHLORIDE IN NACL 20-0.9 MEQ/L-% IV SOLN
Freq: Once | INTRAVENOUS | Status: AC
  Filled 2020-06-14: qty 1000

## 2020-06-14 MED ORDER — SODIUM CHLORIDE 0.9% FLUSH
10.0000 mL | INTRAVENOUS | Status: AC | PRN
  Administered 2020-06-14: 10 mL via INTRAVENOUS
  Filled 2020-06-14: qty 10

## 2020-06-14 NOTE — Progress Notes (Signed)
Pt received IV hydration with potassium. Drank several drinks and ate peanut butter crackers. VSS. No complaints. Ambulatory at discharge.

## 2020-06-14 NOTE — Progress Notes (Signed)
[place] CONSULT NOTE  Patient Care Team: Physicians, [place] as PCP - General Vladimir Crofts, MD as Consulting Physician (Neurology) Cammie Sickle, MD as Consulting Physician (Internal Medicine)  CHIEF COMPLAINTS/PURPOSE OF CONSULTATION: Breast cancer  #  Oncology History Overview Note  # NOV 2021- LEFT BREAST CANCER - [place]; TRIPLE NEGATIVE; Grade of invasive carcinoma: Grade 3; # LYMPH NODE, LEFT AXILLA; [Dr.Cintron]; ULTRASOUND-GUIDED CORE BIOPSY:  - METASTATIC MAMMARY CARCINOMA, AT LEAST 9 MM METASTASIS. ]; DEC 9th PET scan-Intensely hypermetabolic 15.1 cm left breast mass compatible with primary left breast malignancy, with left pectoralis muscle involvement posteriorly and cutaneous involvement by the mass. Multiple smaller hypermetabolic satellite malignant nodules scattered throughout the left breast. Bulky hypermetabolic left axillary and left retropectoral nodal metastases.   # 12/10- Carboplatin ONLY;  Taxol hypersensitivity reaction-discontinued  # 12/17- AC #1 q 3 W  # SURVIVORSHIP:   # GENETICS: p  DIAGNOSIS: Triple negative breast cancer  STAGE: Stage III       ;  GOALS: Cure  CURRENT/MOST RECENT THERAPY : AC    Carcinoma of overlapping sites of left breast in female, estrogen receptor negative ([place])  04/24/2020 Initial Diagnosis   Carcinoma of overlapping sites of left breast in female, estrogen receptor negative ([place])   05/04/2020 - 05/04/2020 Chemotherapy   The patient had dexamethasone (DECADRON) 4 MG tablet, 8 mg, Oral, Daily, 1 of 1 cycle, Start date: --, End date: -- palonosetron (ALOXI) injection 0.25 mg, 0.25 mg, Intravenous,  Once, 1 of 12 cycles Administration: 0.25 mg (05/04/2020) CARBOplatin (PARAPLATIN) 220 mg in sodium chloride 0.9 % 250 mL chemo infusion, 220 mg (100 % of original dose 222.4 mg), Intravenous,  Once, 1 of 12 cycles Dose modification:   (original dose 222.4 mg, Cycle 1) Administration: 220 mg  (05/04/2020) PACLitaxel (TAXOL) 132 mg in sodium chloride 0.9 % 250 mL chemo infusion (</= 80mg /m2), 80 mg/m2 = 132 mg, Intravenous,  Once, 1 of 12 cycles Administration: 132 mg (05/04/2020)  for chemotherapy treatment.    05/11/2020 -  Chemotherapy    Patient is on Treatment Plan: BREAST NEO-ADJUVANT AC Q21D        HISTORY OF PRESENTING ILLNESS: Patient is alone. Tamara [name] 50 y.o.  female locally advanced triple negative breast cancer T4 N2; currently on neoadjuvant chemotherapy with Adriamycin Cytoxan is here for follow-up.  Patient is currently status post cycle #3 approximately 10 weeks ago.   Patient denies any nausea vomiting.  No diarrhea.  No shortness of breath or cough.  She seems to be quite excited that her left breast looks "brown".    Review of Systems  Unable to perform ROS: Psychiatric disorder     MEDICAL HISTORY:  Past Medical History:  Diagnosis Date  . GERD (gastroesophageal reflux disease)   . IUD (intrauterine device) in place IUD was placed in 2016  . Leiomyoma of uterus   . Schizophrenia ([place])   . Thyroid disease     SURGICAL HISTORY: Past Surgical History:  Procedure Laterality Date  . PORTACATH PLACEMENT Right 05/02/2020   Procedure: INSERTION PORT-A-CATH;  Surgeon: Herbert Pun, MD;  Location: [facility] ORS;  Service: General;  Laterality: Right;    SOCIAL HISTORY: Social History   Socioeconomic History  . Marital status: Single    Spouse name: Not on file  . Number of children: Not on file  . Years of education: Not on file  . Highest education level: Not on file  Occupational History  .  Not on file  Tobacco Use  . Smoking status: Never Smoker  . Smokeless tobacco: Never Used  Substance and Sexual Activity  . Alcohol use: No  . Drug use: No  . Sexual activity: Yes    Birth control/protection: Surgical  Other Topics Concern  . Not on file  Social History Narrative  . Not on file   Social Determinants of Health    Financial Resource Strain: Not on file  Food Insecurity: Not on file  Transportation Needs: Not on file  Physical Activity: Not on file  Stress: Not on file  Social Connections: Not on file  Intimate Partner Violence: Not on file    FAMILY HISTORY: No family history on file.  ALLERGIES:  is allergic to paclitaxel.  MEDICATIONS:  Current Outpatient Medications  Medication Sig Dispense Refill  . acetaminophen (TYLENOL) 650 MG suppository Place 1 suppository (650 mg total) rectally every 6 (six) hours as needed for fever. 12 suppository 0  . BANOPHEN 25 MG capsule Take 25 mg by mouth every 6 (six) hours as needed for itching.    . benztropine (COGENTIN) 0.5 MG tablet Take 1 tablet (0.5 mg total) by mouth 2 (two) times daily. 60 tablet 1  . cetirizine (ZYRTEC) 10 MG tablet Take 1 tablet (10 mg total) by mouth daily as needed for allergies. (Patient taking differently: Take 10 mg by mouth daily.)    . divalproex (DEPAKOTE) 500 MG DR tablet Take 1 tablet (500 mg total) by mouth every 12 (twelve) hours. 60 tablet 1  . fluticasone (FLONASE) 50 MCG/ACT nasal spray Place 1 spray into both nostrils daily. 11.1 mL 9  . haloperidol (HALDOL) 5 MG tablet Take 1 tablet (5 mg total) by mouth 3 (three) times daily. 90 tablet 1  . haloperidol decanoate (HALDOL DECANOATE) 100 MG/ML injection Inject 100 mg into the muscle every 28 (twenty-eight) days.    . hydrOXYzine (ATARAX/VISTARIL) 25 MG tablet Take 25 mg by mouth 3 (three) times daily.    Marland Kitchen levothyroxine (SYNTHROID) 88 MCG tablet Take 1 tablet (88 mcg total) by mouth daily at 6 (six) AM. 30 tablet 1  . lidocaine-prilocaine (EMLA) cream Apply 1 application topically as needed. 30 g 3  . MELATONIN PO Take 1 tablet by mouth at bedtime as needed (sleep).    . ondansetron (ZOFRAN) 8 MG tablet One pill every 8 hours as needed for nausea/vomitting. (Patient not taking: No sig reported) 40 tablet 1  . potassium chloride SA (KLOR-CON) 20 MEQ tablet Take 1  tablet (20 mEq total) by mouth 2 (two) times daily. 30 tablet 0  . prochlorperazine (COMPAZINE) 10 MG tablet Take 1 tablet (10 mg total) by mouth every 6 (six) hours as needed for nausea or vomiting. (Patient not taking: No sig reported) 40 tablet 1  . sodium chloride 1 g tablet Take 1 tablet (1 g total) by mouth 2 (two) times daily. 60 tablet 0   No current facility-administered medications for this visit.   Facility-Administered Medications Ordered in Other Visits  Medication Dose Route Frequency Provider Last Rate Last Admin  . 0.9 % NaCl with KCl 20 mEq/ L  infusion   Intravenous Once Earna Coder, MD 999 mL/hr at 06/14/20 0927 New Bag at 06/14/20 0927  . heparin lock flush 100 unit/mL  500 Units Intravenous Once Louretta Shorten R, MD      . sodium chloride flush (NS) 0.9 % injection 10 mL  10 mL Intravenous PRN Earna Coder, MD  10 mL at 06/14/20 0840      .  PHYSICAL EXAMINATION: ECOG PERFORMANCE STATUS: 1 - Symptomatic but completely ambulatory  Vitals:   06/14/20 0850  BP: (!) 106/56  Pulse: (!) 105  Resp: 17  Temp: 99.8 F (37.7 C)  SpO2: 100%   Filed Weights   06/14/20 0850  Weight: 150 lb 6.4 oz (68.2 kg)    Physical Exam Constitutional:      Comments: Accompanied by sister.  Ambulating independently.  HENT:     Head: Normocephalic and atraumatic.     Mouth/Throat:     Pharynx: No oropharyngeal exudate.  Eyes:     Pupils: Pupils are equal, round, and reactive to light.  Cardiovascular:     Rate and Rhythm: Normal rate and regular rhythm.  Pulmonary:     Effort: No respiratory distress.     Breath sounds: No wheezing.  Abdominal:     General: Bowel sounds are normal. There is no distension.     Palpations: Abdomen is soft. There is no mass.     Tenderness: There is no abdominal tenderness. There is no guarding or rebound.  Musculoskeletal:        General: No tenderness. Normal range of motion.     Cervical back: Normal range of  motion and neck supple.  Skin:    General: Skin is warm.     Comments: Large left-sided breast mass noted.  However significant improvement of the breast edema; swelling.  Noted to have left axillary node-see imaging below.  Neurological:     Mental Status: She is alert and oriented to person, place, and time.  Psychiatric:        Mood and Affect: Affect normal.     Comments: Poor insight/tangential thoughts.  Unable to make a coherent conversation-underlying schizophrenia.      LABORATORY DATA:  I have reviewed the data as listed Lab Results  Component Value Date   WBC 1.6 (L) 06/14/2020   HGB 9.1 (L) 06/14/2020   HCT 24.8 (L) 06/14/2020   MCV 86.4 06/14/2020   PLT 69 (L) 06/14/2020   Recent Labs    05/22/20 1305 05/29/20 1424 06/04/20 0856 06/14/20 0822  NA 123* 126* 130* 126*  K 2.9* 2.9* 4.1 3.0*  CL 86* 91* 96* 92*  CO2 26 23 24 28   GLUCOSE 86 143* 86 130*  BUN <5* <5* <5* <5*  CREATININE 0.53 0.56 0.49 0.68  CALCIUM 8.8* 9.0 9.2 8.7*  GFRNONAA >60 >60 >60 >60  PROT 7.5  --  7.3 7.1  ALBUMIN 3.6  --  3.5 3.5  AST 16  --  15 18  ALT 13  --  8 7  ALKPHOS 68  --  53 64  BILITOT 0.4  --  0.5 0.2*    RADIOGRAPHIC STUDIES: I have personally reviewed the radiological images as listed and agreed with the findings in the report. No results found.    06/04/2020-       ASSESSMENT & PLAN:   Carcinoma of overlapping sites of left breast in female, estrogen receptor negative ([place]) #Left breast cancer T4 N2 M0-stage III triple negative; DEC 9th PET scan-I locally advanced breast cancer negative for distant site disease.  Cycle #1 Taxol discontinued after hypersensitive reaction.  s/p 1 dose of carboplatin.  Currently on Adriamycin Cytoxan every 3 weeks; partial response noted-see images above. S/p 3 cycles now.  # Proceed with Adriamycin Cytoxan chemotherapy cycle #4 on 1/31 ;Labs today reviewed.  #  Hyponatremia- sodium 126- likely sec to Poor PO intake.   #  Hypokalemia- K-3.0- proceed with KCL 20 IV toyda; and also- 20 kcl BI at home.   # schizophrenia: STABLE-  Follows up with [facility] [phone]; Dr.Ahluwalia].    # DISPOSITION: # IVFs/ 2 KCL today.  # Follow up as planned on 1/31- MD; labs- cbc/cmp; chemo Adria-Cytoxan; D-2 fulphila; Dr.B  All questions were answered. The patient knows to call the clinic with any problems, questions or concerns.    Cammie Sickle, MD 06/14/2020 9:37 AM

## 2020-06-14 NOTE — Assessment & Plan Note (Addendum)
#  Left breast cancer T4 N2 M0-stage III triple negative; DEC 9th PET scan-I locally advanced breast cancer negative for distant site disease.  Cycle #1 Taxol discontinued after hypersensitive reaction.  s/p 1 dose of carboplatin.  Currently on Adriamycin Cytoxan every 3 weeks; partial response noted-see images above. S/p 3 cycles now.  # Proceed with Adriamycin Cytoxan chemotherapy cycle #4 on 1/31 ;Labs today reviewed. Given Taxol hypersensitivity reaction-we will check regarding Abraxane/Taxotere.  # Hyponatremia- sodium 126- likely sec to Poor PO intake.   # Hypokalemia- K-3.0- proceed with KCL 20 IV toyda; and also- 20 kcl BI at home.   # schizophrenia: STABLE-  Follows up with [facility] [phone]; Dr.Ahluwalia].    # DISPOSITION: # IVFs/ 2 KCL today.  # Follow up as planned on 1/31- MD; labs- cbc/cmp; chemo Adria-Cytoxan; D-2 fulphila; Dr.B

## 2020-06-14 NOTE — Progress Notes (Signed)
Poor historian. Pt is by herself this am. Caregiver dropped pt off. Only comment from CG was pt appetite is down. Pt denies any pain this am.

## 2020-06-19 ENCOUNTER — Other Ambulatory Visit: Payer: Medicaid Other

## 2020-06-19 DIAGNOSIS — Z20822 Contact with and (suspected) exposure to covid-19: Secondary | ICD-10-CM

## 2020-06-20 ENCOUNTER — Telehealth: Payer: Self-pay | Admitting: *Deleted

## 2020-06-20 LAB — NOVEL CORONAVIRUS, NAA: SARS-CoV-2, NAA: DETECTED — AB

## 2020-06-20 LAB — SARS-COV-2, NAA 2 DAY TAT

## 2020-06-20 NOTE — Telephone Encounter (Signed)
Tamara [name]/Tamara [name]- pt will be a candidate for monoclonal antibody therapy as she is on chemo. Please follow up.  Heather- please cancel appt for 1/31.   GB

## 2020-06-20 NOTE — Telephone Encounter (Signed)
Called reporting that patient has tested positive for COVID. Patient has an appointment Monday for treatment. I forwarded call to Scheduling

## 2020-06-21 ENCOUNTER — Encounter: Payer: Self-pay | Admitting: *Deleted

## 2020-06-21 NOTE — Telephone Encounter (Signed)
Left vm for patient's sister, [name] to return my phone call at [phone]. Offering virtual visit with [name], NP.

## 2020-06-21 NOTE — Telephone Encounter (Signed)
Apts cnl per provider request

## 2020-06-21 NOTE — Telephone Encounter (Signed)
Tamara [name] returned my phone call. She is a agreeable to have a MyChart visit with [name], NP tom. at 11am

## 2020-06-22 ENCOUNTER — Inpatient Hospital Stay (HOSPITAL_BASED_OUTPATIENT_CLINIC_OR_DEPARTMENT_OTHER): Payer: Medicaid Other | Admitting: Nurse Practitioner

## 2020-06-22 DIAGNOSIS — U071 COVID-19: Secondary | ICD-10-CM

## 2020-06-22 NOTE — Progress Notes (Signed)
Virtual Visit Progress Note  Symptom Management Clinic [facility]  [phone] [phone]  I connected with [name] on 06/22/20 at 11:00 AM EST by video enabled telemedicine visit and verified that I am speaking with the correct person using two identifiers.   I discussed the limitations, risks, security and privacy concerns of performing an evaluation and management service by telemedicine and the availability of in-person appointments. I also discussed with the patient that there may be a patient responsible charge related to this service. The patient expressed understanding and agreed to proceed.   Other persons participating in the visit and their role in the encounter: patient's sister, [name], and mother  [name] location: home Provider's location: clinic  Chief Complaint: Covid Positive    Patient Care Team: Physicians, [place] as PCP - General Vladimir Crofts, MD as Consulting Physician (Neurology) Cammie Sickle, MD as Consulting Physician (Internal Medicine)   Name of the patient: Tamara [name]  [id number]  12-Apr-1971   Date of visit: 06/22/20  Diagnosis- breast cancer  Chief complaint/ Reason for visit- Covid Positive  Heme/Onc history:  Oncology History Overview Note  # NOV 2021- LEFT BREAST CANCER - [place]; TRIPLE NEGATIVE; Grade of invasive carcinoma: Grade 3; # LYMPH NODE, LEFT AXILLA; [Dr.Cintron]; ULTRASOUND-GUIDED CORE BIOPSY:  - METASTATIC MAMMARY CARCINOMA, AT LEAST 9 MM METASTASIS. ]; DEC 9th PET scan-Intensely hypermetabolic 40.1 cm left breast mass compatible with primary left breast malignancy, with left pectoralis muscle involvement posteriorly and cutaneous involvement by the mass. Multiple smaller hypermetabolic satellite malignant nodules scattered throughout the left breast. Bulky hypermetabolic left axillary and left retropectoral nodal metastases.   # 12/10- Carboplatin ONLY;  Taxol  hypersensitivity reaction-discontinued  # 12/17- AC #1 q 3 W  # SURVIVORSHIP:   # GENETICS: p  DIAGNOSIS: Triple negative breast cancer  STAGE: Stage III       ;  GOALS: Cure  CURRENT/MOST RECENT THERAPY : AC    Carcinoma of overlapping sites of left breast in female, estrogen receptor negative ([place])  04/24/2020 Initial Diagnosis   Carcinoma of overlapping sites of left breast in female, estrogen receptor negative ([place])   05/04/2020 - 05/04/2020 Chemotherapy   The patient had dexamethasone (DECADRON) 4 MG tablet, 8 mg, Oral, Daily, 1 of 1 cycle, Start date: --, End date: -- palonosetron (ALOXI) injection 0.25 mg, 0.25 mg, Intravenous,  Once, 1 of 12 cycles Administration: 0.25 mg (05/04/2020) CARBOplatin (PARAPLATIN) 220 mg in sodium chloride 0.9 % 250 mL chemo infusion, 220 mg (100 % of original dose 222.4 mg), Intravenous,  Once, 1 of 12 cycles Dose modification:   (original dose 222.4 mg, Cycle 1) Administration: 220 mg (05/04/2020) PACLitaxel (TAXOL) 132 mg in sodium chloride 0.9 % 250 mL chemo infusion (</= 15m/m2), 80 mg/m2 = 132 mg, Intravenous,  Once, 1 of 12 cycles Administration: 132 mg (05/04/2020)  for chemotherapy treatment.    05/11/2020 -  Chemotherapy    Patient is on Treatment Plan: BREAST NEO-ADJUVANT AC Q21D      06/14/2020 Cancer Staging   Staging form: Breast, AJCC 8th Edition - Clinical: Stage IIIC (cT4, cN2b, cM0, G3, ER-, PR-, HER2-) - Signed by BCammie Sickle MD on 06/14/2020     Interval history- Tamara [name] 50 year old female diagnosed with triple negative breast cancer currently undergoing chemotherapy presents to symptom management clinic for complaints of positive chest.  Per her sister, symptoms started around January 19 with hoarseness, sore throat, weakness. No diarrhea, nausea,  vomiting, cough, congestion.  Eating and drinking well.  She is vaccinated with J&J in March 2021.  No boosters.  Has not had a flu shot.  No fevers or  chills.  She and her sister say that all of her symptoms seem to be improving. Not able to check oxygen level at home.   Review of systems- Review of Systems  Constitutional: Negative for chills, fever, malaise/fatigue and weight loss.  HENT: Positive for sore throat. Negative for hearing loss, nosebleeds and tinnitus.   Eyes: Negative for blurred vision and double vision.  Respiratory: Negative for cough, hemoptysis, shortness of breath and wheezing.   Cardiovascular: Negative for chest pain, palpitations and leg swelling.  Gastrointestinal: Negative for abdominal pain, blood in stool, constipation, diarrhea, melena, nausea and vomiting.  Genitourinary: Negative for dysuria and urgency.  Musculoskeletal: Negative for back pain, falls, joint pain and myalgias.  Skin: Negative for itching and rash.  Neurological: Negative for dizziness, tingling, sensory change, loss of consciousness, weakness and headaches.  Endo/Heme/Allergies: Negative for environmental allergies. Does not bruise/bleed easily.  Psychiatric/Behavioral: Negative for depression. The patient is not nervous/anxious and does not have insomnia.      Allergies  Allergen Reactions  . Paclitaxel Anaphylaxis    Hypersensitivity reaction [flushing; shortness of breath; drop in blood pressure; tachycardia]    Past Medical History:  Diagnosis Date  . GERD (gastroesophageal reflux disease)   . IUD (intrauterine device) in place IUD was placed in 2016  . Leiomyoma of uterus   . Schizophrenia ([place])   . Thyroid disease     Past Surgical History:  Procedure Laterality Date  . PORTACATH PLACEMENT Right 05/02/2020   Procedure: INSERTION PORT-A-CATH;  Surgeon: Herbert Pun, MD;  Location: [facility] ORS;  Service: General;  Laterality: Right;    Social History   Socioeconomic History  . Marital status: Single    Spouse name: Not on file  . Number of children: Not on file  . Years of education: Not on file  . Highest  education level: Not on file  Occupational History  . Not on file  Tobacco Use  . Smoking status: Never Smoker  . Smokeless tobacco: Never Used  Substance and Sexual Activity  . Alcohol use: No  . Drug use: No  . Sexual activity: Yes    Birth control/protection: Surgical  Other Topics Concern  . Not on file  Social History Narrative  . Not on file   Social Determinants of Health   Financial Resource Strain: Not on file  Food Insecurity: Not on file  Transportation Needs: Not on file  Physical Activity: Not on file  Stress: Not on file  Social Connections: Not on file  Intimate Partner Violence: Not on file    No family history on file.   Current Outpatient Medications:  .  acetaminophen (TYLENOL) 650 MG suppository, Place 1 suppository (650 mg total) rectally every 6 (six) hours as needed for fever., Disp: 12 suppository, Rfl: 0 .  BANOPHEN 25 MG capsule, Take 25 mg by mouth every 6 (six) hours as needed for itching., Disp: , Rfl:  .  benztropine (COGENTIN) 0.5 MG tablet, Take 1 tablet (0.5 mg total) by mouth 2 (two) times daily., Disp: 60 tablet, Rfl: 1 .  cetirizine (ZYRTEC) 10 MG tablet, Take 1 tablet (10 mg total) by mouth daily as needed for allergies. (Patient taking differently: Take 10 mg by mouth daily.), Disp: , Rfl:  .  divalproex (DEPAKOTE) 500 MG DR tablet, Take 1  tablet (500 mg total) by mouth every 12 (twelve) hours., Disp: 60 tablet, Rfl: 1 .  fluticasone (FLONASE) 50 MCG/ACT nasal spray, Place 1 spray into both nostrils daily., Disp: 11.1 mL, Rfl: 9 .  haloperidol (HALDOL) 5 MG tablet, Take 1 tablet (5 mg total) by mouth 3 (three) times daily., Disp: 90 tablet, Rfl: 1 .  haloperidol decanoate (HALDOL DECANOATE) 100 MG/ML injection, Inject 100 mg into the muscle every 28 (twenty-eight) days., Disp: , Rfl:  .  hydrOXYzine (ATARAX/VISTARIL) 25 MG tablet, Take 25 mg by mouth 3 (three) times daily., Disp: , Rfl:  .  KLOR-CON M20 20 MEQ tablet, TAKE 1 TABLET BY  MOUTH TWICE A DAY, Disp: 30 tablet, Rfl: 0 .  levothyroxine (SYNTHROID) 88 MCG tablet, Take 1 tablet (88 mcg total) by mouth daily at 6 (six) AM., Disp: 30 tablet, Rfl: 1 .  lidocaine-prilocaine (EMLA) cream, Apply 1 application topically as needed., Disp: 30 g, Rfl: 3 .  MELATONIN PO, Take 1 tablet by mouth at bedtime as needed (sleep)., Disp: , Rfl:  .  ondansetron (ZOFRAN) 8 MG tablet, One pill every 8 hours as needed for nausea/vomitting. (Patient not taking: No sig reported), Disp: 40 tablet, Rfl: 1 .  prochlorperazine (COMPAZINE) 10 MG tablet, Take 1 tablet (10 mg total) by mouth every 6 (six) hours as needed for nausea or vomiting. (Patient not taking: No sig reported), Disp: 40 tablet, Rfl: 1 .  sodium chloride 1 g tablet, Take 1 tablet (1 g total) by mouth 2 (two) times daily., Disp: 60 tablet, Rfl: 0 No current facility-administered medications for this visit.  Facility-Administered Medications Ordered in Other Visits:  .  sodium chloride flush (NS) 0.9 % injection 10 mL, 10 mL, Intravenous, PRN, Cammie Sickle, MD, 10 mL at 06/14/20 0840  Physical exam: Exam limited due to telemedicine  There were no vitals filed for this visit. Physical Exam HENT:     Nose: Congestion present.     Mouth/Throat:     Comments: hoarseness Neurological:     Mental Status: She is alert.  Psychiatric:        Mood and Affect: Mood normal.      CMP Latest Ref Rng & Units 06/14/2020  Glucose 70 - 99 mg/dL 130(H)  BUN 6 - 20 mg/dL <5(L)  Creatinine 0.44 - 1.00 mg/dL 0.68  Sodium 135 - 145 mmol/L 126(L)  Potassium 3.5 - 5.1 mmol/L 3.0(L)  Chloride 98 - 111 mmol/L 92(L)  CO2 22 - 32 mmol/L 28  Calcium 8.9 - 10.3 mg/dL 8.7(L)  Total Protein 6.5 - 8.1 g/dL 7.1  Total Bilirubin 0.3 - 1.2 mg/dL 0.2(L)  Alkaline Phos 38 - 126 U/L 64  AST 15 - 41 U/L 18  ALT 0 - 44 U/L 7   CBC Latest Ref Rng & Units 06/14/2020  WBC 4.0 - 10.5 K/uL 1.6(L)  Hemoglobin 12.0 - 15.0 g/dL 9.1(L)  Hematocrit 36.0  - 46.0 % 24.8(L)  Platelets 150 - 400 K/uL 69(L)    No images are attached to the encounter.  No results found.  Assessment and plan- Patient is a 50 y.o. female currently receiving chemotherapy for triple negative breast cancer who presents to symptom management clinic for Covid.  Outside of window of 7 days for monoclonal antibody or antiviral treatment.  Symptoms tier reviewed as well as criteria for ending isolation.  Symptoms reviewed that would warrant ED/Hospital evaluation. Preventative practices reviewed. Patient verbalized understanding. Callback number provided. Urgent care and/or ER precautions  given for severe symptoms. Advised to consider booster and flu vaccine in the future.   Will send info for 21 day quarantine to Dr. Aletha Halim team for scheduling purposes.    Visit Diagnosis 1. COVID-19 virus infection    Patient expressed understanding and was in agreement with this plan. She also understands that She can call clinic at any time with any questions, concerns, or complaints.   I discussed the assessment and treatment plan with the patient. The patient was provided an opportunity to ask questions and all were answered. The patient agreed with the plan and demonstrated an understanding of the instructions.   The patient was advised to call back or seek an in-person evaluation if the symptoms worsen or if the condition fails to improve as anticipated.   I provided 15 minutes of face-to-face video visit time  dedicated to the care of this patient on the date of this encounter to include pre-visit review of most recent medical oncology note, face-to-face time with the patient, and post visit ordering of testing/documentation.   Thank you for allowing me to participate in the care of this very pleasant patient.   Beckey Rutter, DNP, AGNP-C [facility]

## 2020-06-25 ENCOUNTER — Inpatient Hospital Stay: Payer: Medicaid Other

## 2020-06-25 ENCOUNTER — Inpatient Hospital Stay: Payer: Medicaid Other | Admitting: Hospice and Palliative Medicine

## 2020-06-25 ENCOUNTER — Inpatient Hospital Stay: Payer: Medicaid Other | Admitting: Internal Medicine

## 2020-06-26 ENCOUNTER — Ambulatory Visit: Payer: Medicaid Other

## 2020-06-26 ENCOUNTER — Other Ambulatory Visit: Payer: Self-pay | Admitting: Nurse Practitioner

## 2020-07-06 ENCOUNTER — Inpatient Hospital Stay: Payer: Medicaid Other

## 2020-07-06 ENCOUNTER — Inpatient Hospital Stay: Payer: Medicaid Other | Admitting: Hospice and Palliative Medicine

## 2020-07-06 ENCOUNTER — Inpatient Hospital Stay: Payer: Medicaid Other | Admitting: Internal Medicine

## 2020-07-09 ENCOUNTER — Inpatient Hospital Stay: Payer: Medicaid Other

## 2020-07-09 ENCOUNTER — Inpatient Hospital Stay: Payer: Medicaid Other | Attending: Internal Medicine

## 2020-07-09 ENCOUNTER — Inpatient Hospital Stay (HOSPITAL_BASED_OUTPATIENT_CLINIC_OR_DEPARTMENT_OTHER): Payer: Medicaid Other | Admitting: Hospice and Palliative Medicine

## 2020-07-09 ENCOUNTER — Encounter: Payer: Self-pay | Admitting: Internal Medicine

## 2020-07-09 ENCOUNTER — Inpatient Hospital Stay (HOSPITAL_BASED_OUTPATIENT_CLINIC_OR_DEPARTMENT_OTHER): Payer: Medicaid Other | Admitting: Internal Medicine

## 2020-07-09 VITALS — HR 100

## 2020-07-09 DIAGNOSIS — Z7189 Other specified counseling: Secondary | ICD-10-CM

## 2020-07-09 DIAGNOSIS — Z5111 Encounter for antineoplastic chemotherapy: Secondary | ICD-10-CM | POA: Insufficient documentation

## 2020-07-09 DIAGNOSIS — F209 Schizophrenia, unspecified: Secondary | ICD-10-CM | POA: Diagnosis not present

## 2020-07-09 DIAGNOSIS — C50812 Malignant neoplasm of overlapping sites of left female breast: Secondary | ICD-10-CM | POA: Diagnosis not present

## 2020-07-09 DIAGNOSIS — Z515 Encounter for palliative care: Secondary | ICD-10-CM | POA: Diagnosis not present

## 2020-07-09 DIAGNOSIS — Z171 Estrogen receptor negative status [ER-]: Secondary | ICD-10-CM

## 2020-07-09 DIAGNOSIS — E079 Disorder of thyroid, unspecified: Secondary | ICD-10-CM | POA: Diagnosis not present

## 2020-07-09 DIAGNOSIS — K219 Gastro-esophageal reflux disease without esophagitis: Secondary | ICD-10-CM | POA: Insufficient documentation

## 2020-07-09 LAB — CBC WITH DIFFERENTIAL/PLATELET
Abs Immature Granulocytes: 0.61 10*3/uL — ABNORMAL HIGH (ref 0.00–0.07)
Basophils Absolute: 0.1 10*3/uL (ref 0.0–0.1)
Basophils Relative: 1 %
Eosinophils Absolute: 0 10*3/uL (ref 0.0–0.5)
Eosinophils Relative: 0 %
HCT: 27.7 % — ABNORMAL LOW (ref 36.0–46.0)
Hemoglobin: 10.1 g/dL — ABNORMAL LOW (ref 12.0–15.0)
Immature Granulocytes: 5 %
Lymphocytes Relative: 18 %
Lymphs Abs: 2.1 10*3/uL (ref 0.7–4.0)
MCH: 32 pg (ref 26.0–34.0)
MCHC: 36.5 g/dL — ABNORMAL HIGH (ref 30.0–36.0)
MCV: 87.7 fL (ref 80.0–100.0)
Monocytes Absolute: 2.4 10*3/uL — ABNORMAL HIGH (ref 0.1–1.0)
Monocytes Relative: 21 %
Neutro Abs: 6.2 10*3/uL (ref 1.7–7.7)
Neutrophils Relative %: 55 %
Platelets: 218 10*3/uL (ref 150–400)
RBC: 3.16 MIL/uL — ABNORMAL LOW (ref 3.87–5.11)
RDW: 15.9 % — ABNORMAL HIGH (ref 11.5–15.5)
WBC: 11.3 10*3/uL — ABNORMAL HIGH (ref 4.0–10.5)
nRBC: 0 % (ref 0.0–0.2)

## 2020-07-09 LAB — COMPREHENSIVE METABOLIC PANEL
ALT: 8 U/L (ref 0–44)
AST: 15 U/L (ref 15–41)
Albumin: 3.5 g/dL (ref 3.5–5.0)
Alkaline Phosphatase: 56 U/L (ref 38–126)
Anion gap: 9 (ref 5–15)
BUN: <5 mg/dL — ABNORMAL LOW (ref 6–20)
CO2: 26 mmol/L (ref 22–32)
Calcium: 9 mg/dL (ref 8.9–10.3)
Chloride: 90 mmol/L — ABNORMAL LOW (ref 98–111)
Creatinine, Ser: 0.47 mg/dL (ref 0.44–1.00)
GFR, Estimated: >60 mL/min (ref >60)
Glucose, Bld: 86 mg/dL (ref 70–99)
Potassium: 3.2 mmol/L — ABNORMAL LOW (ref 3.5–5.1)
Sodium: 125 mmol/L — ABNORMAL LOW (ref 135–145)
Total Bilirubin: 0.3 mg/dL (ref 0.3–1.2)
Total Protein: 7.2 g/dL (ref 6.5–8.1)

## 2020-07-09 MED ORDER — PALONOSETRON HCL INJECTION 0.25 MG/5ML
0.2500 mg | Freq: Once | INTRAVENOUS | Status: AC
  Administered 2020-07-09: 0.25 mg via INTRAVENOUS
  Filled 2020-07-09: qty 5

## 2020-07-09 MED ORDER — SODIUM CHLORIDE 0.9 % IV SOLN
150.0000 mg | Freq: Once | INTRAVENOUS | Status: AC
  Administered 2020-07-09: 150 mg via INTRAVENOUS
  Filled 2020-07-09: qty 150

## 2020-07-09 MED ORDER — HEPARIN SOD (PORK) LOCK FLUSH 100 UNIT/ML IV SOLN
500.0000 [IU] | Freq: Once | INTRAVENOUS | Status: AC | PRN
  Administered 2020-07-09: 500 [IU]
  Filled 2020-07-09: qty 5

## 2020-07-09 MED ORDER — DOXORUBICIN HCL CHEMO IV INJECTION 2 MG/ML
60.0000 mg/m2 | Freq: Once | INTRAVENOUS | Status: AC
  Administered 2020-07-09: 100 mg via INTRAVENOUS
  Filled 2020-07-09: qty 50

## 2020-07-09 MED ORDER — HEPARIN SOD (PORK) LOCK FLUSH 100 UNIT/ML IV SOLN
INTRAVENOUS | Status: AC
  Filled 2020-07-09: qty 5

## 2020-07-09 MED ORDER — SODIUM CHLORIDE 0.9 % IV SOLN
600.0000 mg/m2 | Freq: Once | INTRAVENOUS | Status: AC
  Administered 2020-07-09: 1000 mg via INTRAVENOUS
  Filled 2020-07-09: qty 50

## 2020-07-09 MED ORDER — SODIUM CHLORIDE 0.9 % IV SOLN
Freq: Once | INTRAVENOUS | Status: AC
  Filled 2020-07-09: qty 250

## 2020-07-09 MED ORDER — SODIUM CHLORIDE 0.9 % IV SOLN
10.0000 mg | Freq: Once | INTRAVENOUS | Status: AC
  Administered 2020-07-09: 10 mg via INTRAVENOUS
  Filled 2020-07-09: qty 10

## 2020-07-09 NOTE — Progress Notes (Signed)
Tamara [name]  Telephone:([phone] [phone]   Name: Tamara [name] Date: 07/09/2020 MRN: [id number]  DOB: 14-Aug-1970  Patient Care Team: Physicians, [place] as PCP - General Vladimir Crofts, MD as Consulting Physician (Neurology) Cammie Sickle, MD as Consulting Physician (Internal Medicine)    REASON FOR CONSULTATION: Tamara [name] is a 50 y.o. female with multiple medical problems including including schizophrenia, intellectual disability, who was hospitalized 04/03/2020-11/60/21 for worsening agitation and was IVC due to acute psychosis.  Patient was found to have a large left breast mass with biopsy on 11/12 confirming invasive breast cancer.   CT of the chest/abdomen/pelvis on 11/10 showed the breast mass extending into pectoralis major muscle and possibly invading the chest wall. She also had several lymph nodes concerning for metastatic adenopathy. Treatment options were felt to be limited in part due to her schizophrenia, agitation, and poor insight. Palliative care was consulted to address goals..   SOCIAL HISTORY:     reports that she has never smoked. She has never used smokeless tobacco. She reports that she does not drink alcohol and does not use drugs.  Patient is unmarried.  She has no children.  She lives at home with her mother and father. She has two sisters who are involved in her care.   ADVANCE DIRECTIVES:  Reportedly patient's mother is her legal guardian  CODE STATUS:   PAST MEDICAL HISTORY: Past Medical History:  Diagnosis Date  . GERD (gastroesophageal reflux disease)   . IUD (intrauterine device) in place IUD was placed in 2016  . Leiomyoma of uterus   . Schizophrenia ([place])   . Thyroid disease     PAST SURGICAL HISTORY:  Past Surgical History:  Procedure Laterality Date  . PORTACATH PLACEMENT Right 05/02/2020   Procedure: INSERTION PORT-A-CATH;  Surgeon: Herbert Pun,  MD;  Location: [facility] ORS;  Service: General;  Laterality: Right;    HEMATOLOGY/ONCOLOGY HISTORY:  Oncology History Overview Note  # NOV 2021- LEFT BREAST CANCER - [place]; TRIPLE NEGATIVE; Grade of invasive carcinoma: Grade 3; # LYMPH NODE, LEFT AXILLA; [Dr.Cintron]; ULTRASOUND-GUIDED CORE BIOPSY:  - METASTATIC MAMMARY CARCINOMA, AT LEAST 9 MM METASTASIS. ]; DEC 9th PET scan-Intensely hypermetabolic 05.6 cm left breast mass compatible with primary left breast malignancy, with left pectoralis muscle involvement posteriorly and cutaneous involvement by the mass. Multiple smaller hypermetabolic satellite malignant nodules scattered throughout the left breast. Bulky hypermetabolic left axillary and left retropectoral nodal metastases.   # 12/10- Carboplatin ONLY;  Taxol hypersensitivity reaction-discontinued  # 12/17- AC #1 q 3 W  # SURVIVORSHIP:   # GENETICS: p  DIAGNOSIS: Triple negative breast cancer  STAGE: Stage III       ;  GOALS: Cure  CURRENT/MOST RECENT THERAPY : AC    Carcinoma of overlapping sites of left breast in female, estrogen receptor negative ([place])  04/24/2020 Initial Diagnosis   Carcinoma of overlapping sites of left breast in female, estrogen receptor negative ([place])   05/04/2020 - 05/04/2020 Chemotherapy   The patient had dexamethasone (DECADRON) 4 MG tablet, 8 mg, Oral, Daily, 1 of 1 cycle, Start date: --, End date: -- palonosetron (ALOXI) injection 0.25 mg, 0.25 mg, Intravenous,  Once, 1 of 12 cycles Administration: 0.25 mg (05/04/2020) CARBOplatin (PARAPLATIN) 220 mg in sodium chloride 0.9 % 250 mL chemo infusion, 220 mg (100 % of original dose 222.4 mg), Intravenous,  Once, 1 of 12 cycles Dose modification:   (original dose 222.4 mg, Cycle  1) Administration: 220 mg (05/04/2020) PACLitaxel (TAXOL) 132 mg in sodium chloride 0.9 % 250 mL chemo infusion (</= 71m/m2), 80 mg/m2 = 132 mg, Intravenous,  Once, 1 of 12 cycles Administration: 132 mg (05/04/2020)  for  chemotherapy treatment.    05/11/2020 -  Chemotherapy    Patient is on Treatment Plan: BREAST NEO-ADJUVANT AC Q21D      06/14/2020 Cancer Staging   Staging form: Breast, AJCC 8th Edition - Clinical: Stage IIIC (cT4, cN2b, cM0, G3, ER-, PR-, HER2-) - Signed by BCammie Sickle MD on 06/14/2020     ALLERGIES:  is allergic to paclitaxel.  MEDICATIONS:  Current Outpatient Medications  Medication Sig Dispense Refill  . acetaminophen (TYLENOL) 650 MG suppository Place 1 suppository (650 mg total) rectally every 6 (six) hours as needed for fever. 12 suppository 0  . BANOPHEN 25 MG capsule Take 25 mg by mouth every 6 (six) hours as needed for itching.    . benztropine (COGENTIN) 0.5 MG tablet Take 1 tablet (0.5 mg total) by mouth 2 (two) times daily. 60 tablet 1  . cetirizine (ZYRTEC) 10 MG tablet Take 1 tablet (10 mg total) by mouth daily as needed for allergies. (Patient taking differently: Take 10 mg by mouth daily.)    . clonazePAM (KLONOPIN) 0.5 MG tablet Take 0.5 mg by mouth at bedtime.    . divalproex (DEPAKOTE) 500 MG DR tablet Take 1 tablet (500 mg total) by mouth every 12 (twelve) hours. 60 tablet 1  . fluticasone (FLONASE) 50 MCG/ACT nasal spray Place 1 spray into both nostrils daily. 11.1 mL 9  . haloperidol (HALDOL) 5 MG tablet Take 1 tablet (5 mg total) by mouth 3 (three) times daily. 90 tablet 1  . haloperidol decanoate (HALDOL DECANOATE) 100 MG/ML injection Inject 100 mg into the muscle every 28 (twenty-eight) days.    . hydrOXYzine (ATARAX/VISTARIL) 25 MG tablet Take 25 mg by mouth 3 (three) times daily.    .Marland KitchenKLOR-CON M20 20 MEQ tablet TAKE 1 TABLET BY MOUTH TWICE A DAY 30 tablet 0  . levothyroxine (SYNTHROID) 88 MCG tablet Take 1 tablet (88 mcg total) by mouth daily at 6 (six) AM. 30 tablet 1  . lidocaine-prilocaine (EMLA) cream Apply 1 application topically as needed. 30 g 3  . MELATONIN PO Take 1 tablet by mouth at bedtime as needed (sleep).    . ondansetron (ZOFRAN) 8  MG tablet One pill every 8 hours as needed for nausea/vomitting. (Patient not taking: No sig reported) 40 tablet 1  . prochlorperazine (COMPAZINE) 10 MG tablet Take 1 tablet (10 mg total) by mouth every 6 (six) hours as needed for nausea or vomiting. (Patient not taking: No sig reported) 40 tablet 1  . sodium chloride 1 g tablet Take 1 tablet (1 g total) by mouth 3 (three) times daily. 90 tablet 0   No current facility-administered medications for this visit.   Facility-Administered Medications Ordered in Other Visits  Medication Dose Route Frequency Provider Last Rate Last Admin  . cyclophosphamide (CYTOXAN) 1,000 mg in sodium chloride 0.9 % 250 mL chemo infusion  600 mg/m2 (Treatment Plan Recorded) Intravenous Once BCharlaine DaltonR, MD      . DOXOrubicin (ADRIAMYCIN) chemo injection 100 mg  60 mg/m2 (Treatment Plan Recorded) Intravenous Once BCharlaine DaltonR, MD      . sodium chloride flush (NS) 0.9 % injection 10 mL  10 mL Intravenous PRN BCammie Sickle MD   10 mL at 06/14/20 0840    VITAL  SIGNS: There were no vitals taken for this visit. There were no vitals filed for this visit.  Estimated body mass index is 29.48 kg/m as calculated from the following:   Height as of an earlier encounter on 07/09/20: _0  (1.549 m).   Weight as of an earlier encounter on 07/09/20: 156 lb (70.8 kg).  LABS: CBC:    Component Value Date/Time   WBC 11.3 (H) 07/09/2020 0940   HGB 10.1 (L) 07/09/2020 0940   HGB 13.4 11/11/2013 2142   HCT 27.7 (L) 07/09/2020 0940   HCT 39.3 11/11/2013 2142   PLT 218 07/09/2020 0940   PLT 295 11/11/2013 2142   MCV 87.7 07/09/2020 0940   MCV 90 11/11/2013 2142   NEUTROABS 6.2 07/09/2020 0940   LYMPHSABS 2.1 07/09/2020 0940   MONOABS 2.4 (H) 07/09/2020 0940   EOSABS 0.0 07/09/2020 0940   BASOSABS 0.1 07/09/2020 0940   Comprehensive Metabolic Panel:    Component Value Date/Time   NA 125 (L) 07/09/2020 0940   NA 135 (L) 11/14/2013 1309   K 3.2  (L) 07/09/2020 0940   K 3.9 11/14/2013 1309   CL 90 (L) 07/09/2020 0940   CL 97 (L) 11/14/2013 1309   CO2 26 07/09/2020 0940   CO2 29 11/14/2013 1309   BUN <5 (L) 07/09/2020 0940   BUN 3 (L) 11/14/2013 1309   CREATININE 0.47 07/09/2020 0940   CREATININE 0.88 11/14/2013 1309   GLUCOSE 86 07/09/2020 0940   GLUCOSE 117 (H) 11/14/2013 1309   CALCIUM 9.0 07/09/2020 0940   CALCIUM 9.6 11/14/2013 1309   AST 15 07/09/2020 0940   AST 25 11/11/2013 2142   ALT 8 07/09/2020 0940   ALT 18 11/11/2013 2142   ALKPHOS 56 07/09/2020 0940   ALKPHOS 111 11/11/2013 2142   BILITOT 0.3 07/09/2020 0940   BILITOT 0.3 11/11/2013 2142   PROT 7.2 07/09/2020 0940   PROT 8.1 11/11/2013 2142   ALBUMIN 3.5 07/09/2020 0940   ALBUMIN 3.8 11/11/2013 2142    RADIOGRAPHIC STUDIES: No results found.  PERFORMANCE STATUS (ECOG) : 1 - Symptomatic but completely ambulatory  Review of Systems Unless otherwise noted, a complete review of systems is negative.  Physical Exam General: NAD Pulmonary: Unlabored Abdomen: soft, nontender, + bowel sounds GU: no suprapubic tenderness Extremities: no edema, no joint deformities Skin: no rashes Neurological: Weakness, confusion  IMPRESSION: Routine follow-up.  Patient was accompanied by her mother today but her mother became ill and had to leave the clinic early.  Patient reports that she is doing well.  She denies any significant changes or concerns.  Although, her breast was not examined, patient offered that it seems to be reducing in size with treatment.  There are no symptomatic complaints at present.  Patient says that she saw her psychiatrist recently who adjusted her medications.  Note that I have previously tried calling her psychiatrist but never received a call back.  PLAN: -Continue current scope of treatment -Follow-up MyChart visit 2 months   Patient expressed understanding and was in agreement with this plan. She also understands that She can call the  clinic at any time with any questions, concerns, or complaints.     Time Total: 15 minutes  Visit consisted of counseling and education dealing with the complex and emotionally intense issues of symptom management and palliative care in the setting of serious and potentially life-threatening illness.Greater than 50%  of this time was spent counseling and coordinating care related to the above assessment and  plan.  Signed by: Altha Harm, PhD, NP-C

## 2020-07-09 NOTE — Progress Notes (Signed)
HR 104 ok to treat per MD

## 2020-07-09 NOTE — Progress Notes (Signed)
[facility] CONSULT NOTE  Patient Care Team: Physicians, [facility] as PCP - General Vladimir Crofts, MD as Consulting Physician (Neurology) Cammie Sickle, MD as Consulting Physician (Internal Medicine)  CHIEF COMPLAINTS/PURPOSE OF CONSULTATION: Breast cancer  #  Oncology History Overview Note  # NOV 2021- LEFT BREAST CANCER - [place]; TRIPLE NEGATIVE; Grade of invasive carcinoma: Grade 3; # LYMPH NODE, LEFT AXILLA; [Dr.Cintron]; ULTRASOUND-GUIDED CORE BIOPSY:  - METASTATIC MAMMARY CARCINOMA, AT LEAST 9 MM METASTASIS. ]; DEC 9th PET scan-Intensely hypermetabolic 93.2 cm left breast mass compatible with primary left breast malignancy, with left pectoralis muscle involvement posteriorly and cutaneous involvement by the mass. Multiple smaller hypermetabolic satellite malignant nodules scattered throughout the left breast. Bulky hypermetabolic left axillary and left retropectoral nodal metastases.   # 12/10- Carboplatin ONLY;  Taxol hypersensitivity reaction-discontinued  # 12/17- AC #1 q 3 W  # SURVIVORSHIP:   # GENETICS: p  DIAGNOSIS: Triple negative breast cancer  STAGE: Stage III       ;  GOALS: Cure  CURRENT/MOST RECENT THERAPY : AC    Carcinoma of overlapping sites of left breast in female, estrogen receptor negative ([facility])  04/24/2020 Initial Diagnosis   Carcinoma of overlapping sites of left breast in female, estrogen receptor negative ([place])   05/04/2020 - 05/04/2020 Chemotherapy   The patient had dexamethasone (DECADRON) 4 MG tablet, 8 mg, Oral, Daily, 1 of 1 cycle, Start date: --, End date: -- palonosetron (ALOXI) injection 0.25 mg, 0.25 mg, Intravenous,  Once, 1 of 12 cycles Administration: 0.25 mg (05/04/2020) CARBOplatin (PARAPLATIN) 220 mg in sodium chloride 0.9 % 250 mL chemo infusion, 220 mg (100 % of original dose 222.4 mg), Intravenous,  Once, 1 of 12 cycles Dose modification:   (original dose 222.4 mg, Cycle 1) Administration: 220 mg  (05/04/2020) PACLitaxel (TAXOL) 132 mg in sodium chloride 0.9 % 250 mL chemo infusion (</= 29m/m2), 80 mg/m2 = 132 mg, Intravenous,  Once, 1 of 12 cycles Administration: 132 mg (05/04/2020)  for chemotherapy treatment.    05/11/2020 -  Chemotherapy    Patient is on Treatment Plan: BREAST NEO-ADJUVANT AC Q21D      06/14/2020 Cancer Staging   Staging form: Breast, AJCC 8th Edition - Clinical: Stage IIIC (cT4, cN2b, cM0, G3, ER-, PR-, HER2-) - Signed by BCammie Sickle MD on 06/14/2020     HISTORY OF PRESENTING ILLNESS: Patient is alone.   Tamara Paling89y.o.  female with schizophrenia and left locally advanced triple negative breast cancer T4 N2; currently on neoadjuvant chemotherapy with Adriamycin Cytoxan is here for follow-up.  [name] chemotherapy cycle #3 was held because of recent Covid diagnosis.  Patient did not need any hospitalizations or monoclonal antibody therapy.  Tamara [name] was fairly asymptomatic.  No nausea no vomiting.  No shortness of breath or cough.   Review of Systems  Unable to perform ROS: Psychiatric disorder     MEDICAL HISTORY:  Past Medical History:  Diagnosis Date  . GERD (gastroesophageal reflux disease)   . IUD (intrauterine device) in place IUD was placed in 2016  . Leiomyoma of uterus   . Schizophrenia ([facility]   . Thyroid disease     SURGICAL HISTORY: Past Surgical History:  Procedure Laterality Date  . PORTACATH PLACEMENT Right 05/02/2020   Procedure: INSERTION PORT-A-CATH;  Surgeon: CHerbert Pun MD;  Location: [facility] ORS;  Service: General;  Laterality: Right;    SOCIAL HISTORY: Social History   Socioeconomic History  . Marital status: Single  Spouse name: Not on file  . Number of children: Not on file  . Years of education: Not on file  . Highest education level: Not on file  Occupational History  . Not on file  Tobacco Use  . Smoking status: Never Smoker  . Smokeless tobacco: Never Used  Substance and Sexual  Activity  . Alcohol use: No  . Drug use: No  . Sexual activity: Yes    Birth control/protection: Surgical  Other Topics Concern  . Not on file  Social History Narrative  . Not on file   Social Determinants of Health   Financial Resource Strain: Not on file  Food Insecurity: Not on file  Transportation Needs: Not on file  Physical Activity: Not on file  Stress: Not on file  Social Connections: Not on file  Intimate Partner Violence: Not on file    FAMILY HISTORY: No family history on file.  ALLERGIES:  is allergic to paclitaxel.  MEDICATIONS:  Current Outpatient Medications  Medication Sig Dispense Refill  . acetaminophen (TYLENOL) 650 MG suppository Place 1 suppository (650 mg total) rectally every 6 (six) hours as needed for fever. 12 suppository 0  . BANOPHEN 25 MG capsule Take 25 mg by mouth every 6 (six) hours as needed for itching.    . benztropine (COGENTIN) 0.5 MG tablet Take 1 tablet (0.5 mg total) by mouth 2 (two) times daily. 60 tablet 1  . cetirizine (ZYRTEC) 10 MG tablet Take 1 tablet (10 mg total) by mouth daily as needed for allergies. (Patient taking differently: Take 10 mg by mouth daily.)    . divalproex (DEPAKOTE) 500 MG DR tablet Take 1 tablet (500 mg total) by mouth every 12 (twelve) hours. 60 tablet 1  . fluticasone (FLONASE) 50 MCG/ACT nasal spray Place 1 spray into both nostrils daily. 11.1 mL 9  . haloperidol (HALDOL) 5 MG tablet Take 1 tablet (5 mg total) by mouth 3 (three) times daily. 90 tablet 1  . haloperidol decanoate (HALDOL DECANOATE) 100 MG/ML injection Inject 100 mg into the muscle every 28 (twenty-eight) days.    . hydrOXYzine (ATARAX/VISTARIL) 25 MG tablet Take 25 mg by mouth 3 (three) times daily.    Marland Kitchen KLOR-CON M20 20 MEQ tablet TAKE 1 TABLET BY MOUTH TWICE A DAY 30 tablet 0  . levothyroxine (SYNTHROID) 88 MCG tablet Take 1 tablet (88 mcg total) by mouth daily at 6 (six) AM. 30 tablet 1  . lidocaine-prilocaine (EMLA) cream Apply 1  application topically as needed. 30 g 3  . MELATONIN PO Take 1 tablet by mouth at bedtime as needed (sleep).    . sodium chloride 1 g tablet Take 1 tablet (1 g total) by mouth 3 (three) times daily. 90 tablet 0  . clonazePAM (KLONOPIN) 0.5 MG tablet Take 0.5 mg by mouth at bedtime.    . ondansetron (ZOFRAN) 8 MG tablet One pill every 8 hours as needed for nausea/vomitting. (Patient not taking: No sig reported) 40 tablet 1  . prochlorperazine (COMPAZINE) 10 MG tablet Take 1 tablet (10 mg total) by mouth every 6 (six) hours as needed for nausea or vomiting. (Patient not taking: No sig reported) 40 tablet 1   No current facility-administered medications for this visit.   Facility-Administered Medications Ordered in Other Visits  Medication Dose Route Frequency Provider Last Rate Last Admin  . sodium chloride flush (NS) 0.9 % injection 10 mL  10 mL Intravenous PRN Cammie Sickle, MD   10 mL at 06/14/20 0840      .  PHYSICAL EXAMINATION: ECOG PERFORMANCE STATUS: 1 - Symptomatic but completely ambulatory  Vitals:   07/09/20 0957  BP: 135/85  Pulse: (!) 101  Resp: 16  Temp: 97.6 F (36.4 C)  SpO2: 97%   Filed Weights   07/09/20 0957  Weight: 156 lb (70.8 kg)    Physical Exam Constitutional:      Comments: Accompanied by sister.  Ambulating independently.  HENT:     Head: Normocephalic and atraumatic.     Mouth/Throat:     Pharynx: No oropharyngeal exudate.  Eyes:     Pupils: Pupils are equal, round, and reactive to light.  Cardiovascular:     Rate and Rhythm: Normal rate and regular rhythm.  Pulmonary:     Effort: No respiratory distress.     Breath sounds: No wheezing.  Abdominal:     General: Bowel sounds are normal. There is no distension.     Palpations: Abdomen is soft. There is no mass.     Tenderness: There is no abdominal tenderness. There is no guarding or rebound.  Musculoskeletal:        General: No tenderness. Normal range of motion.     Cervical  back: Normal range of motion and neck supple.  Skin:    General: Skin is warm.     Comments: Large left-sided breast mass noted.  However significant improvement of the breast edema; swelling.  Noted to have left axillary node-see imaging below.  Neurological:     Mental Status: Tamara [name] is alert and oriented to person, place, and time.  Psychiatric:        Mood and Affect: Affect normal.     Comments: Poor insight/tangential thoughts.  Unable to make a coherent conversation-underlying schizophrenia.      LABORATORY DATA:  I have reviewed the data as listed Lab Results  Component Value Date   WBC 11.3 (H) 07/09/2020   HGB 10.1 (L) 07/09/2020   HCT 27.7 (L) 07/09/2020   MCV 87.7 07/09/2020   PLT 218 07/09/2020   Recent Labs    06/04/20 0856 06/14/20 0822 07/09/20 0940  NA 130* 126* 125*  K 4.1 3.0* 3.2*  CL 96* 92* 90*  CO2 24 28 26   GLUCOSE 86 130* 86  BUN <5* <5* <5*  CREATININE 0.49 0.68 0.47  CALCIUM 9.2 8.7* 9.0  GFRNONAA >60 >60 >60  PROT 7.3 7.1 7.2  ALBUMIN 3.5 3.5 3.5  AST 15 18 15   ALT 8 7 8   ALKPHOS 53 64 56  BILITOT 0.5 0.2* 0.3    RADIOGRAPHIC STUDIES: I have personally reviewed the radiological images as listed and agreed with the findings in the report. No results found.    06/04/2020-       ASSESSMENT & PLAN:   Carcinoma of overlapping sites of left breast in female, estrogen receptor negative ([place]) #Left breast cancer T4 N2 M0-stage III triple negative; DEC 9th PET scan-I locally advanced breast cancer negative for distant site disease.  Cycle #1 Taxol discontinued after hypersensitive reaction.  s/p 1 dose of carboplatin.  Currently on Adriamycin Cytoxan every 3 weeks; partial response noted-see images above. S/p 3 cycles now.  # Proceed with Adriamycin Cytoxan chemotherapy cycle #3 today. Labs today reviewed;  acceptable for treatment today.   # chronic Hyponatremia-poor PO intake/psyche medications:  sodium 126- likely sec to Poor PO  intake.   # Hypokalemia- K-3.2; continue 20 kcl BI at home.   # schizophrenia: STABLE: There chest x-ray follows up with [facility] [phone]; Dr.Ahluwalia].  will leave message again.   # DISPOSITION: # adria-cytoxan today;  # fulphila tomorrow # follow up in 3 weeks- MD; labs- cbc/cmp; chemo Adria-Cytoxan; D-2 fulphila; Dr.B  All questions were answered. The patient knows to call the clinic with any problems, questions or concerns.    Cammie Sickle, MD 07/09/2020 2:09 PM

## 2020-07-09 NOTE — Assessment & Plan Note (Addendum)
#  Left breast cancer T4 N2 M0-stage III triple negative; DEC 9th PET scan-I locally advanced breast cancer negative for distant site disease.  Cycle #1 Taxol discontinued after hypersensitive reaction.  s/p 1 dose of carboplatin.  Currently on Adriamycin Cytoxan every 3 weeks; partial response noted-see images above. S/p 3 cycles now.  # Proceed with Adriamycin Cytoxan chemotherapy cycle #3 today. Labs today reviewed;  acceptable for treatment today.   # chronic Hyponatremia-poor PO intake/psyche medications:  sodium 126- likely sec to Poor PO intake.   # Hypokalemia- K-3.2; continue 20 kcl BI at home.   # schizophrenia: STABLE: There chest x-ray follows up with [facility] [phone]; Dr.Ahluwalia]. will leave message again.   # DISPOSITION: # adria-cytoxan today;  # fulphila tomorrow # follow up in 3 weeks- MD; labs- cbc/cmp; chemo Adria-Cytoxan; D-2 fulphila; Dr.B

## 2020-07-09 NOTE — Progress Notes (Signed)
HR between 100-104. Per Dr. Rogue Bussing, okay to treat  Patient tolerated treatment well. Discharged home.

## 2020-07-09 NOTE — Progress Notes (Signed)
RN Chaperoned provider with Breast Exam.   

## 2020-07-09 NOTE — Progress Notes (Signed)
On today's visit, patient's mother/Legal Guardian Tamara [name]) was not feeling well. Tamara [name], felt light headed/presyncope and was actively vomiting. She stated that she is a diabetic and didn't eat breatkfast; she did not have any glucometer with her.  She felt better after drinking soda and eating crackers. Vitals taken by RN-stable. Mother was advised to seek care care at urgent care/ER. Mother of patient refused emergency services. She asked me to take her to her ride, who was waiting in the parking lot. [name] sister, [name], notified that her mother is not able to stay today due to the acute illness. [name] will arrange the pick up patient.  Patient very concerned about her mother. Patient kept updated on her mother and care provided for her mother. Patient thanked Therapist, sports for helping her mom.

## 2020-07-10 ENCOUNTER — Telehealth: Payer: Self-pay | Admitting: *Deleted

## 2020-07-10 ENCOUNTER — Inpatient Hospital Stay: Payer: Medicaid Other

## 2020-07-10 DIAGNOSIS — Z7189 Other specified counseling: Secondary | ICD-10-CM

## 2020-07-10 DIAGNOSIS — Z5111 Encounter for antineoplastic chemotherapy: Secondary | ICD-10-CM | POA: Diagnosis not present

## 2020-07-10 DIAGNOSIS — Z171 Estrogen receptor negative status [ER-]: Secondary | ICD-10-CM

## 2020-07-10 DIAGNOSIS — C50812 Malignant neoplasm of overlapping sites of left female breast: Secondary | ICD-10-CM

## 2020-07-10 MED ORDER — PEGFILGRASTIM-JMDB 6 MG/0.6ML ~~LOC~~ SOSY
6.0000 mg | PREFILLED_SYRINGE | Freq: Once | SUBCUTANEOUS | Status: AC
  Administered 2020-07-10: 6 mg via SUBCUTANEOUS
  Filled 2020-07-10: qty 0.6

## 2020-07-10 NOTE — Telephone Encounter (Signed)
Left vm for Dr.Ahluwalia [phone] to return Dr. Aletha Halim phone call.

## 2020-07-11 ENCOUNTER — Telehealth: Payer: Self-pay | Admitting: Internal Medicine

## 2020-07-11 NOTE — Telephone Encounter (Signed)
On 2/14-I called and spoke to patient's Tamara [name] regarding patient's clinical status. Discussed regarding N abnormalities/continue hydration. Follow-up as planned.

## 2020-07-30 ENCOUNTER — Inpatient Hospital Stay: Payer: Medicaid Other

## 2020-07-30 ENCOUNTER — Inpatient Hospital Stay (HOSPITAL_BASED_OUTPATIENT_CLINIC_OR_DEPARTMENT_OTHER): Payer: Medicaid Other | Admitting: Internal Medicine

## 2020-07-30 ENCOUNTER — Other Ambulatory Visit: Payer: Self-pay

## 2020-07-30 ENCOUNTER — Inpatient Hospital Stay: Payer: Medicaid Other | Attending: Internal Medicine

## 2020-07-30 VITALS — HR 100

## 2020-07-30 VITALS — BP 156/71 | HR 105 | Temp 98.3°F | Resp 16 | Wt 159.2 lb

## 2020-07-30 DIAGNOSIS — E871 Hypo-osmolality and hyponatremia: Secondary | ICD-10-CM | POA: Insufficient documentation

## 2020-07-30 DIAGNOSIS — Z5189 Encounter for other specified aftercare: Secondary | ICD-10-CM | POA: Diagnosis not present

## 2020-07-30 DIAGNOSIS — C50812 Malignant neoplasm of overlapping sites of left female breast: Secondary | ICD-10-CM

## 2020-07-30 DIAGNOSIS — Z171 Estrogen receptor negative status [ER-]: Secondary | ICD-10-CM | POA: Insufficient documentation

## 2020-07-30 DIAGNOSIS — Z5111 Encounter for antineoplastic chemotherapy: Secondary | ICD-10-CM | POA: Insufficient documentation

## 2020-07-30 DIAGNOSIS — F209 Schizophrenia, unspecified: Secondary | ICD-10-CM | POA: Diagnosis not present

## 2020-07-30 DIAGNOSIS — K219 Gastro-esophageal reflux disease without esophagitis: Secondary | ICD-10-CM | POA: Insufficient documentation

## 2020-07-30 DIAGNOSIS — E079 Disorder of thyroid, unspecified: Secondary | ICD-10-CM | POA: Diagnosis not present

## 2020-07-30 DIAGNOSIS — C50912 Malignant neoplasm of unspecified site of left female breast: Secondary | ICD-10-CM | POA: Insufficient documentation

## 2020-07-30 DIAGNOSIS — Z79899 Other long term (current) drug therapy: Secondary | ICD-10-CM | POA: Diagnosis not present

## 2020-07-30 DIAGNOSIS — Z7189 Other specified counseling: Secondary | ICD-10-CM

## 2020-07-30 DIAGNOSIS — C773 Secondary and unspecified malignant neoplasm of axilla and upper limb lymph nodes: Secondary | ICD-10-CM | POA: Diagnosis not present

## 2020-07-30 LAB — CBC WITH DIFFERENTIAL/PLATELET
Abs Immature Granulocytes: 0.06 10*3/uL (ref 0.00–0.07)
Basophils Absolute: 0 10*3/uL (ref 0.0–0.1)
Basophils Relative: 0 %
Eosinophils Absolute: 0 10*3/uL (ref 0.0–0.5)
Eosinophils Relative: 0 %
HCT: 28.1 % — ABNORMAL LOW (ref 36.0–46.0)
Hemoglobin: 10 g/dL — ABNORMAL LOW (ref 12.0–15.0)
Immature Granulocytes: 1 %
Lymphocytes Relative: 11 %
Lymphs Abs: 1 10*3/uL (ref 0.7–4.0)
MCH: 31.7 pg (ref 26.0–34.0)
MCHC: 35.6 g/dL (ref 30.0–36.0)
MCV: 89.2 fL (ref 80.0–100.0)
Monocytes Absolute: 1.5 10*3/uL — ABNORMAL HIGH (ref 0.1–1.0)
Monocytes Relative: 17 %
Neutro Abs: 6.1 10*3/uL (ref 1.7–7.7)
Neutrophils Relative %: 71 %
Platelets: 325 10*3/uL (ref 150–400)
RBC: 3.15 MIL/uL — ABNORMAL LOW (ref 3.87–5.11)
RDW: 16.2 % — ABNORMAL HIGH (ref 11.5–15.5)
WBC: 8.7 10*3/uL (ref 4.0–10.5)
nRBC: 0 % (ref 0.0–0.2)

## 2020-07-30 LAB — COMPREHENSIVE METABOLIC PANEL
ALT: 9 U/L (ref 0–44)
AST: 19 U/L (ref 15–41)
Albumin: 3.6 g/dL (ref 3.5–5.0)
Alkaline Phosphatase: 60 U/L (ref 38–126)
Anion gap: 13 (ref 5–15)
BUN: <5 mg/dL — ABNORMAL LOW (ref 6–20)
CO2: 23 mmol/L (ref 22–32)
Calcium: 8.3 mg/dL — ABNORMAL LOW (ref 8.9–10.3)
Chloride: 84 mmol/L — ABNORMAL LOW (ref 98–111)
Creatinine, Ser: 0.41 mg/dL — ABNORMAL LOW (ref 0.44–1.00)
GFR, Estimated: >60 mL/min (ref >60)
Glucose, Bld: 92 mg/dL (ref 70–99)
Potassium: 3 mmol/L — ABNORMAL LOW (ref 3.5–5.1)
Sodium: 120 mmol/L — ABNORMAL LOW (ref 135–145)
Total Bilirubin: 0.4 mg/dL (ref 0.3–1.2)
Total Protein: 7.3 g/dL (ref 6.5–8.1)

## 2020-07-30 MED ORDER — HEPARIN SOD (PORK) LOCK FLUSH 100 UNIT/ML IV SOLN
INTRAVENOUS | Status: AC
  Filled 2020-07-30: qty 5

## 2020-07-30 MED ORDER — DOXORUBICIN HCL CHEMO IV INJECTION 2 MG/ML
60.0000 mg/m2 | Freq: Once | INTRAVENOUS | Status: AC
  Administered 2020-07-30: 100 mg via INTRAVENOUS
  Filled 2020-07-30: qty 50

## 2020-07-30 MED ORDER — SODIUM CHLORIDE 1 G PO TABS: 1.0000 g | ORAL_TABLET | Freq: Three times a day (TID) | ORAL | 0 refills | Status: DC

## 2020-07-30 MED ORDER — SODIUM CHLORIDE 0.9 % IV SOLN
600.0000 mg/m2 | Freq: Once | INTRAVENOUS | Status: AC
  Administered 2020-07-30: 1000 mg via INTRAVENOUS
  Filled 2020-07-30: qty 50

## 2020-07-30 MED ORDER — SODIUM CHLORIDE 0.9 % IV SOLN
10.0000 mg | Freq: Once | INTRAVENOUS | Status: AC
  Administered 2020-07-30: 10 mg via INTRAVENOUS
  Filled 2020-07-30: qty 10

## 2020-07-30 MED ORDER — FOSAPREPITANT DIMEGLUMINE INJECTION 150 MG
150.0000 mg | Freq: Once | INTRAVENOUS | Status: AC
  Administered 2020-07-30: 150 mg via INTRAVENOUS
  Filled 2020-07-30: qty 150

## 2020-07-30 MED ORDER — POTASSIUM CHLORIDE CRYS ER 20 MEQ PO TBCR: 20.0000 meq | EXTENDED_RELEASE_TABLET | Freq: Two times a day (BID) | ORAL | 0 refills | Status: DC

## 2020-07-30 MED ORDER — SODIUM CHLORIDE 0.9 % IV SOLN
Freq: Once | INTRAVENOUS | Status: AC
Start: 2020-07-30 — End: 2020-07-30
  Filled 2020-07-30: qty 250

## 2020-07-30 MED ORDER — HEPARIN SOD (PORK) LOCK FLUSH 100 UNIT/ML IV SOLN
500.0000 [IU] | Freq: Once | INTRAVENOUS | Status: AC | PRN
  Administered 2020-07-30: 500 [IU]
  Filled 2020-07-30: qty 5

## 2020-07-30 MED ORDER — PALONOSETRON HCL INJECTION 0.25 MG/5ML
0.2500 mg | Freq: Once | INTRAVENOUS | Status: AC
  Administered 2020-07-30: 0.25 mg via INTRAVENOUS
  Filled 2020-07-30: qty 5

## 2020-07-30 NOTE — Assessment & Plan Note (Addendum)
#  Left breast cancer T4 N2 M0-stage III triple negative; DEC 9th PET scan-I locally advanced breast cancer negative for distant site disease.  Cycle #1 Taxol discontinued after hypersensitive/anaphylactic reaction.  s/p 1 dose of carboplatin.    Currently on Adriamycin Cytoxan every 3 weeks; partial response noted-see images above. S/p 3 cycles now.  # Proceed with Adriamycin Cytoxan chemotherapy cycle #4 today. Labs today reviewed;  acceptable for treatment today.  We will plan Abraxane-carboplatin  # chronic Hyponatremia-poor PO intake/psyche medications:  sodium 120- likely sec to Poor PO intake.  Check urine sodium; uric acid and UA.  # Electrolytes-Hyponatremia/ Hypokalemia- K-3.2; continue 20 kcl BI at home.   # schizophrenia: STABLE; [facility] [phone]; Dr.Ahluwalia].   # DISPOSITION: # adria-cytoxan today; ADD IVFs over 1 hour.  # fulphila tomorrow # in 10 days- cbc/bmp; Possible IVFs over 1 hours; possible 20 meq KCL.  # follow up in 3 weeks- MD; labs- cbc/cmp; Carbo-Abraxane- left breast Mammogram/US prior- Dr.B  Addendum: Given patient's anaphylactic reaction to Taxol-recommend Abraxane.  Also given patient's fair tolerance to good tolerance to chemotherapy [Adriamycin-Cytoxan]; however suboptimal response-recommend switching the chemotherapy to carboplatin and Abraxane/Keytruda.

## 2020-07-30 NOTE — Progress Notes (Signed)
[facility] CONSULT NOTE  Patient Care Team: Physicians, [place] as PCP - General Vladimir Crofts, MD as Consulting Physician (Neurology) Cammie Sickle, MD as Consulting Physician (Internal Medicine) Randel Pigg, MD as Consulting Physician (Psychiatry)  CHIEF COMPLAINTS/PURPOSE OF CONSULTATION: Breast cancer  #  Oncology History Overview Note  # NOV 2021- LEFT BREAST CANCER - [place]; TRIPLE NEGATIVE; Grade of invasive carcinoma: Grade 3; # LYMPH NODE, LEFT AXILLA; [Dr.Cintron]; ULTRASOUND-GUIDED CORE BIOPSY:  - METASTATIC MAMMARY CARCINOMA, AT LEAST 9 MM METASTASIS. ]; DEC 9th PET scan-Intensely hypermetabolic 94.1 cm left breast mass compatible with primary left breast malignancy, with left pectoralis muscle involvement posteriorly and cutaneous involvement by the mass. Multiple smaller hypermetabolic satellite malignant nodules scattered throughout the left breast. Bulky hypermetabolic left axillary and left retropectoral nodal metastases.   # 12/10- Carboplatin ONLY;  Taxol hypersensitivity reaction-discontinued  # 12/17- AC #1 q 3 W  # SURVIVORSHIP:   # GENETICS: p  DIAGNOSIS: Triple negative breast cancer  STAGE: Stage III       ;  GOALS: Cure  CURRENT/MOST RECENT THERAPY : AC    Carcinoma of overlapping sites of left breast in female, estrogen receptor negative ([place])  04/24/2020 Initial Diagnosis   Carcinoma of overlapping sites of left breast in female, estrogen receptor negative ([place])   05/04/2020 - 05/04/2020 Chemotherapy   The patient had dexamethasone (DECADRON) 4 MG tablet, 8 mg, Oral, Daily, 1 of 1 cycle, Start date: --, End date: -- palonosetron (ALOXI) injection 0.25 mg, 0.25 mg, Intravenous,  Once, 1 of 12 cycles Administration: 0.25 mg (05/04/2020) CARBOplatin (PARAPLATIN) 220 mg in sodium chloride 0.9 % 250 mL chemo infusion, 220 mg (100 % of original dose 222.4 mg), Intravenous,  Once, 1 of 12 cycles Dose modification:    (original dose 222.4 mg, Cycle 1) Administration: 220 mg (05/04/2020) PACLitaxel (TAXOL) 132 mg in sodium chloride 0.9 % 250 mL chemo infusion (</= 32m/m2), 80 mg/m2 = 132 mg, Intravenous,  Once, 1 of 12 cycles Administration: 132 mg (05/04/2020)  for chemotherapy treatment.    05/11/2020 -  Chemotherapy    Patient is on Treatment Plan: BREAST NEO-ADJUVANT AC Q21D      06/14/2020 Cancer Staging   Staging form: Breast, AJCC 8th Edition - Clinical: Stage IIIC (cT4, cN2b, cM0, G3, ER-, PR-, HER2-) - Signed by BCammie Sickle MD on 06/14/2020     HISTORY OF PRESENTING ILLNESS: Patient is accompanied by her mother/patient is poor historian. Tamara [id number].  female with schizophrenia and left locally advanced triple negative breast cancer T4 N2; currently on neoadjuvant chemotherapy with Adriamycin Cytoxan is here for follow-up.  Patient currently s/p 3 cycles of Adriamycin Cytoxan.  Patient has not had any hospitalizations.  Fairly asymptomatic.  As per the mother patient has been drinking fluids.  Denies any chest pain or shortness of breath.  Review of Systems  Unable to perform ROS: Psychiatric disorder     MEDICAL HISTORY:  Past Medical History:  Diagnosis Date  . GERD (gastroesophageal reflux disease)   . IUD (intrauterine device) in place IUD was placed in 2016  . Leiomyoma of uterus   . Schizophrenia ([place]   . Thyroid disease     SURGICAL HISTORY: Past Surgical History:  Procedure Laterality Date  . PORTACATH PLACEMENT Right 05/02/2020   Procedure: INSERTION PORT-A-CATH;  Surgeon: CHerbert Pun MD;  Location: [facility] ORS;  Service: General;  Laterality: Right;    SOCIAL HISTORY: Social History  Socioeconomic History  . Marital status: Single    Spouse name: Not on file  . Number of children: Not on file  . Years of education: Not on file  . Highest education level: Not on file  Occupational History  . Not on file  Tobacco Use  . Smoking  status: Never Smoker  . Smokeless tobacco: Never Used  Substance and Sexual Activity  . Alcohol use: No  . Drug use: No  . Sexual activity: Yes    Birth control/protection: Surgical  Other Topics Concern  . Not on file  Social History Narrative  . Not on file   Social Determinants of Health   Financial Resource Strain: Not on file  Food Insecurity: Not on file  Transportation Needs: Not on file  Physical Activity: Not on file  Stress: Not on file  Social Connections: Not on file  Intimate Partner Violence: Not on file    FAMILY HISTORY: No family history on file.  ALLERGIES:  is allergic to paclitaxel.  MEDICATIONS:  Current Outpatient Medications  Medication Sig Dispense Refill  . acetaminophen (TYLENOL) 650 MG suppository Place 1 suppository (650 mg total) rectally every 6 (six) hours as needed for fever. 12 suppository 0  . BANOPHEN 25 MG capsule Take 25 mg by mouth every 6 (six) hours as needed for itching.    . benztropine (COGENTIN) 0.5 MG tablet Take 1 tablet (0.5 mg total) by mouth 2 (two) times daily. 60 tablet 1  . cetirizine (ZYRTEC) 10 MG tablet Take 1 tablet (10 mg total) by mouth daily as needed for allergies. (Patient taking differently: Take 10 mg by mouth daily.)    . clonazePAM (KLONOPIN) 0.5 MG tablet Take 0.5 mg by mouth at bedtime.    . divalproex (DEPAKOTE) 500 MG DR tablet Take 1 tablet (500 mg total) by mouth every 12 (twelve) hours. 60 tablet 1  . fluticasone (FLONASE) 50 MCG/ACT nasal spray Place 1 spray into both nostrils daily. 11.1 mL 9  . haloperidol (HALDOL) 5 MG tablet Take 1 tablet (5 mg total) by mouth 3 (three) times daily. 90 tablet 1  . haloperidol decanoate (HALDOL DECANOATE) 100 MG/ML injection Inject 100 mg into the muscle every 28 (twenty-eight) days.    . hydrOXYzine (ATARAX/VISTARIL) 25 MG tablet Take 25 mg by mouth 3 (three) times daily.    Marland Kitchen levothyroxine (SYNTHROID) 88 MCG tablet Take 1 tablet (88 mcg total) by mouth daily at 6  (six) AM. 30 tablet 1  . lidocaine-prilocaine (EMLA) cream Apply 1 application topically as needed. 30 g 3  . medroxyPROGESTERone (DEPO-PROVERA) 150 MG/ML injection Inject into the muscle.    Marland Kitchen MELATONIN PO Take 1 tablet by mouth at bedtime as needed (sleep).    . ondansetron (ZOFRAN) 8 MG tablet One pill every 8 hours as needed for nausea/vomitting. 40 tablet 1  . prochlorperazine (COMPAZINE) 10 MG tablet Take 1 tablet (10 mg total) by mouth every 6 (six) hours as needed for nausea or vomiting. 40 tablet 1  . potassium chloride SA (KLOR-CON M20) 20 MEQ tablet Take 1 tablet (20 mEq total) by mouth 2 (two) times daily. 60 tablet 0  . sodium chloride 1 g tablet Take 1 tablet (1 g total) by mouth 3 (three) times daily. 90 tablet 0   No current facility-administered medications for this visit.   Facility-Administered Medications Ordered in Other Visits  Medication Dose Route Frequency Provider Last Rate Last Admin  . sodium chloride flush (NS) 0.9 % injection 10  mL  10 mL Intravenous PRN Cammie Sickle, MD   10 mL at 06/14/20 0840      .  PHYSICAL EXAMINATION: ECOG PERFORMANCE STATUS: 1 - Symptomatic but completely ambulatory  Vitals:   07/30/20 1005  BP: (!) 156/71  Pulse: (!) 105  Resp: 16  Temp: 98.3 F (36.8 C)  SpO2: 100%   Filed Weights   07/30/20 1005  Weight: 159 lb 3.2 oz (72.2 kg)    Physical Exam Constitutional:      Comments: Accompanied by sister.  Ambulating independently.  HENT:     Head: Normocephalic and atraumatic.     Mouth/Throat:     Pharynx: No oropharyngeal exudate.  Eyes:     Pupils: Pupils are equal, round, and reactive to light.  Cardiovascular:     Rate and Rhythm: Normal rate and regular rhythm.  Pulmonary:     Effort: No respiratory distress.     Breath sounds: No wheezing.  Abdominal:     General: Bowel sounds are normal. There is no distension.     Palpations: Abdomen is soft. There is no mass.     Tenderness: There is no  abdominal tenderness. There is no guarding or rebound.  Musculoskeletal:        General: No tenderness. Normal range of motion.     Cervical back: Normal range of motion and neck supple.  Skin:    General: Skin is warm.     Comments: Large left-sided breast mass noted.  However significant improvement of the breast edema; swelling.  Noted to have left axillary node-see imaging below.  Neurological:     Mental Status: She is alert and oriented to person, place, and time.  Psychiatric:        Mood and Affect: Affect normal.     Comments: Poor insight/tangential thoughts.  Unable to make a coherent conversation-underlying schizophrenia.      LABORATORY DATA:  I have reviewed the data as listed Lab Results  Component Value Date   WBC 8.7 07/30/2020   HGB 10.0 (L) 07/30/2020   HCT 28.1 (L) 07/30/2020   MCV 89.2 07/30/2020   PLT 325 07/30/2020   Recent Labs    06/14/20 0822 07/09/20 0940 07/30/20 0939  NA 126* 125* 120*  K 3.0* 3.2* 3.0*  CL 92* 90* 84*  CO2 _0 GLUCOSE 130* 86 92  BUN <5* <5* <5*  CREATININE 0.68 0.47 0.41*  CALCIUM 8.7* 9.0 8.3*  GFRNONAA >60 >60 >60  PROT 7.1 7.2 7.3  ALBUMIN 3.5 3.5 3.6  AST _1 ALT _2 ALKPHOS 64 56 60  BILITOT 0.2* 0.3 0.4    RADIOGRAPHIC STUDIES: I have personally reviewed the radiological images as listed and agreed with the findings in the report. No results found.    06/04/2020-     07/30/2020-     ASSESSMENT & PLAN:   Carcinoma of overlapping sites of left breast in female, estrogen receptor negative ([place]) #Left breast cancer T4 N2 M0-stage III triple negative; DEC 9th PET scan-I locally advanced breast cancer negative for distant site disease.  Cycle #1 Taxol discontinued after hypersensitive reaction.  s/p 1 dose of carboplatin.  Currently on Adriamycin Cytoxan every 3 weeks; partial response noted-see images above. S/p 3 cycles now.  # Proceed with Adriamycin Cytoxan chemotherapy cycle #4  today. Labs today reviewed;  acceptable for treatment today.  We will plan Abraxane-carboplatin  # chronic Hyponatremia-poor PO intake/psyche medications:  sodium 120- likely sec to Poor PO intake.  Check urine sodium; uric acid and UA.  # Electrolytes-Hyponatremia/ Hypokalemia- K-3.2; continue 20 kcl BI at home.   # schizophrenia: STABLE; Tritnity [phone]; Dr.Ahluwalia].   # DISPOSITION: # adria-cytoxan today; ADD IVFs over 1 hour.  # fulphila tomorrow # in 10 days- cbc/bmp; Possible IVFs over 1 hours; possible 20 meq KCL.  # follow up in 3 weeks- MD; labs- cbc/cmp; Carbo-Abraxane- left breast Mammogram/US prior- Dr.B  All questions were answered. The patient knows to call the clinic with any problems, questions or concerns.    Cammie Sickle, MD 08/01/2020 8:09 AM

## 2020-07-31 ENCOUNTER — Inpatient Hospital Stay: Payer: Medicaid Other

## 2020-07-31 ENCOUNTER — Telehealth: Payer: Self-pay | Admitting: Oncology

## 2020-07-31 DIAGNOSIS — Z7189 Other specified counseling: Secondary | ICD-10-CM

## 2020-07-31 DIAGNOSIS — Z171 Estrogen receptor negative status [ER-]: Secondary | ICD-10-CM

## 2020-07-31 DIAGNOSIS — Z5111 Encounter for antineoplastic chemotherapy: Secondary | ICD-10-CM | POA: Diagnosis not present

## 2020-07-31 DIAGNOSIS — C50812 Malignant neoplasm of overlapping sites of left female breast: Secondary | ICD-10-CM

## 2020-07-31 MED ORDER — PEGFILGRASTIM-JMDB 6 MG/0.6ML ~~LOC~~ SOSY
6.0000 mg | PREFILLED_SYRINGE | Freq: Once | SUBCUTANEOUS | Status: AC
  Administered 2020-07-31: 6 mg via SUBCUTANEOUS
  Filled 2020-07-31: qty 0.6

## 2020-07-31 NOTE — Telephone Encounter (Signed)
Contacted the [facility] transportation. They can have an uber driver to come get the patient and transport the patient to the apt today.

## 2020-07-31 NOTE — Telephone Encounter (Signed)
Pt mother called to report that she can't bring the patient in today for injection because she doesn't have transportation. Please advise.

## 2020-08-08 ENCOUNTER — Inpatient Hospital Stay: Payer: Medicaid Other

## 2020-08-08 ENCOUNTER — Other Ambulatory Visit: Payer: Self-pay | Admitting: *Deleted

## 2020-08-08 VITALS — BP 116/63 | HR 97 | Temp 98.0°F | Resp 18

## 2020-08-08 DIAGNOSIS — C50812 Malignant neoplasm of overlapping sites of left female breast: Secondary | ICD-10-CM

## 2020-08-08 DIAGNOSIS — Z171 Estrogen receptor negative status [ER-]: Secondary | ICD-10-CM

## 2020-08-08 DIAGNOSIS — E871 Hypo-osmolality and hyponatremia: Secondary | ICD-10-CM

## 2020-08-08 DIAGNOSIS — E876 Hypokalemia: Secondary | ICD-10-CM

## 2020-08-08 DIAGNOSIS — Z5111 Encounter for antineoplastic chemotherapy: Secondary | ICD-10-CM | POA: Diagnosis not present

## 2020-08-08 LAB — CBC WITH DIFFERENTIAL/PLATELET
Abs Immature Granulocytes: 0.01 10*3/uL (ref 0.00–0.07)
Basophils Absolute: 0 10*3/uL (ref 0.0–0.1)
Basophils Relative: 2 %
Eosinophils Absolute: 0 10*3/uL (ref 0.0–0.5)
Eosinophils Relative: 1 %
HCT: 25 % — ABNORMAL LOW (ref 36.0–46.0)
Hemoglobin: 8.7 g/dL — ABNORMAL LOW (ref 12.0–15.0)
Immature Granulocytes: 1 %
Lymphocytes Relative: 58 %
Lymphs Abs: 0.5 10*3/uL — ABNORMAL LOW (ref 0.7–4.0)
MCH: 32.1 pg (ref 26.0–34.0)
MCHC: 34.8 g/dL (ref 30.0–36.0)
MCV: 92.3 fL (ref 80.0–100.0)
Monocytes Absolute: 0.1 10*3/uL (ref 0.1–1.0)
Monocytes Relative: 12 %
Neutro Abs: 0.2 10*3/uL — CL (ref 1.7–7.7)
Neutrophils Relative %: 26 %
Platelets: 76 10*3/uL — ABNORMAL LOW (ref 150–400)
RBC: 2.71 MIL/uL — ABNORMAL LOW (ref 3.87–5.11)
RDW: 14.7 % (ref 11.5–15.5)
Smear Review: NORMAL
WBC: 0.9 10*3/uL — CL (ref 4.0–10.5)
nRBC: 0 % (ref 0.0–0.2)

## 2020-08-08 LAB — COMPREHENSIVE METABOLIC PANEL
ALT: 10 U/L (ref 0–44)
AST: 14 U/L — ABNORMAL LOW (ref 15–41)
Albumin: 3.9 g/dL (ref 3.5–5.0)
Alkaline Phosphatase: 63 U/L (ref 38–126)
Anion gap: 11 (ref 5–15)
BUN: <5 mg/dL — ABNORMAL LOW (ref 6–20)
CO2: 24 mmol/L (ref 22–32)
Calcium: 9.1 mg/dL (ref 8.9–10.3)
Chloride: 94 mmol/L — ABNORMAL LOW (ref 98–111)
Creatinine, Ser: 0.53 mg/dL (ref 0.44–1.00)
GFR, Estimated: >60 mL/min (ref >60)
Glucose, Bld: 111 mg/dL — ABNORMAL HIGH (ref 70–99)
Potassium: 3.3 mmol/L — ABNORMAL LOW (ref 3.5–5.1)
Sodium: 129 mmol/L — ABNORMAL LOW (ref 135–145)
Total Bilirubin: 0.8 mg/dL (ref 0.3–1.2)
Total Protein: 7.8 g/dL (ref 6.5–8.1)

## 2020-08-08 LAB — URINALYSIS, MICROSCOPIC (REFLEX)

## 2020-08-08 LAB — URIC ACID: Uric Acid, Serum: 1.8 mg/dL — ABNORMAL LOW (ref 2.5–7.1)

## 2020-08-08 LAB — URINALYSIS, ROUTINE W REFLEX MICROSCOPIC
Bilirubin Urine: NEGATIVE
Glucose, UA: NEGATIVE mg/dL
Ketones, ur: NEGATIVE mg/dL
Nitrite: NEGATIVE
Protein, ur: NEGATIVE mg/dL
Specific Gravity, Urine: 1.01 (ref 1.005–1.030)
pH: 7 (ref 5.0–8.0)

## 2020-08-08 LAB — SODIUM, URINE, RANDOM: Sodium, Ur: <10 mmol/L

## 2020-08-08 MED ORDER — POTASSIUM CHLORIDE 20 MEQ/100ML IV SOLN
20.0000 meq | Freq: Once | INTRAVENOUS | Status: AC
  Administered 2020-08-08: 20 meq via INTRAVENOUS

## 2020-08-08 MED ORDER — HEPARIN SOD (PORK) LOCK FLUSH 100 UNIT/ML IV SOLN
500.0000 [IU] | Freq: Once | INTRAVENOUS | Status: AC | PRN
  Administered 2020-08-08: 500 [IU]
  Filled 2020-08-08: qty 5

## 2020-08-08 MED ORDER — SODIUM CHLORIDE 0.9% FLUSH
10.0000 mL | Freq: Once | INTRAVENOUS | Status: AC | PRN
  Administered 2020-08-08: 10 mL
  Filled 2020-08-08: qty 10

## 2020-08-08 MED ORDER — SODIUM CHLORIDE 0.9 % IV SOLN
Freq: Once | INTRAVENOUS | Status: AC
  Filled 2020-08-08: qty 250

## 2020-08-08 NOTE — Progress Notes (Signed)
Arrived for labs and IVF's today. Requested pt to get  a urine sample. Mom states, I wish someone would have told us, she just peed prior to me calling her back.. Will see if pt can obtain specimen prior to discharge today. States she had a runny nose last night. Denies cough, sore throat or runny nose today. Eating and drinking during visit. 10:43 Critical lab results of WBC 0.9 and ANC 0.2 reported. Dr B notified. Told pt to wash her hands frequently, wear a mask and not be around anyone sick. Instructed her to call us if she developes any fever. 11:40 Obtained urine specimen and sent to lab. Pt stable at discharge VSS.

## 2020-08-14 ENCOUNTER — Other Ambulatory Visit: Payer: Self-pay | Admitting: Internal Medicine

## 2020-08-17 ENCOUNTER — Ambulatory Visit
Admission: RE | Admit: 2020-08-17 | Discharge: 2020-08-17 | Disposition: A | Discharge status: Home / Self Care | Payer: Medicaid Other | Source: Ambulatory Visit | Attending: Internal Medicine | Admitting: Internal Medicine

## 2020-08-17 ENCOUNTER — Other Ambulatory Visit: Payer: Self-pay | Admitting: Internal Medicine

## 2020-08-17 ENCOUNTER — Other Ambulatory Visit: Payer: Self-pay

## 2020-08-17 DIAGNOSIS — Z171 Estrogen receptor negative status [ER-]: Secondary | ICD-10-CM

## 2020-08-17 DIAGNOSIS — C50812 Malignant neoplasm of overlapping sites of left female breast: Secondary | ICD-10-CM | POA: Insufficient documentation

## 2020-08-20 ENCOUNTER — Other Ambulatory Visit: Payer: Self-pay

## 2020-08-20 ENCOUNTER — Inpatient Hospital Stay (HOSPITAL_BASED_OUTPATIENT_CLINIC_OR_DEPARTMENT_OTHER): Payer: Medicaid Other | Admitting: Internal Medicine

## 2020-08-20 ENCOUNTER — Inpatient Hospital Stay: Payer: Medicaid Other

## 2020-08-20 ENCOUNTER — Encounter: Payer: Self-pay | Admitting: Internal Medicine

## 2020-08-20 VITALS — BP 129/79 | HR 85 | Temp 98.2°F | Resp 16 | Ht 61.0 in | Wt 143.0 lb

## 2020-08-20 DIAGNOSIS — Z171 Estrogen receptor negative status [ER-]: Secondary | ICD-10-CM | POA: Diagnosis not present

## 2020-08-20 DIAGNOSIS — E871 Hypo-osmolality and hyponatremia: Secondary | ICD-10-CM

## 2020-08-20 DIAGNOSIS — E876 Hypokalemia: Secondary | ICD-10-CM

## 2020-08-20 DIAGNOSIS — Z95828 Presence of other vascular implants and grafts: Secondary | ICD-10-CM

## 2020-08-20 DIAGNOSIS — C50812 Malignant neoplasm of overlapping sites of left female breast: Secondary | ICD-10-CM

## 2020-08-20 DIAGNOSIS — Z7189 Other specified counseling: Secondary | ICD-10-CM

## 2020-08-20 DIAGNOSIS — Z5111 Encounter for antineoplastic chemotherapy: Secondary | ICD-10-CM | POA: Diagnosis not present

## 2020-08-20 LAB — CBC WITH DIFFERENTIAL/PLATELET
Abs Immature Granulocytes: 0.02 10*3/uL (ref 0.00–0.07)
Basophils Absolute: 0 10*3/uL (ref 0.0–0.1)
Basophils Relative: 1 %
Eosinophils Absolute: 0 10*3/uL (ref 0.0–0.5)
Eosinophils Relative: 0 %
HCT: 29.4 % — ABNORMAL LOW (ref 36.0–46.0)
Hemoglobin: 10.4 g/dL — ABNORMAL LOW (ref 12.0–15.0)
Immature Granulocytes: 1 %
Lymphocytes Relative: 32 %
Lymphs Abs: 1.1 10*3/uL (ref 0.7–4.0)
MCH: 32.5 pg (ref 26.0–34.0)
MCHC: 35.4 g/dL (ref 30.0–36.0)
MCV: 91.9 fL (ref 80.0–100.0)
Monocytes Absolute: 0.8 10*3/uL (ref 0.1–1.0)
Monocytes Relative: 23 %
Neutro Abs: 1.5 10*3/uL — ABNORMAL LOW (ref 1.7–7.7)
Neutrophils Relative %: 43 %
Platelets: 500 10*3/uL — ABNORMAL HIGH (ref 150–400)
RBC: 3.2 MIL/uL — ABNORMAL LOW (ref 3.87–5.11)
RDW: 15.3 % (ref 11.5–15.5)
Smear Review: NORMAL
WBC: 3.6 10*3/uL — ABNORMAL LOW (ref 4.0–10.5)
nRBC: 0 % (ref 0.0–0.2)

## 2020-08-20 LAB — COMPREHENSIVE METABOLIC PANEL
ALT: 13 U/L (ref 0–44)
AST: 19 U/L (ref 15–41)
Albumin: 3.9 g/dL (ref 3.5–5.0)
Alkaline Phosphatase: 57 U/L (ref 38–126)
Anion gap: 12 (ref 5–15)
BUN: <5 mg/dL — ABNORMAL LOW (ref 6–20)
CO2: 27 mmol/L (ref 22–32)
Calcium: 9.5 mg/dL (ref 8.9–10.3)
Chloride: 94 mmol/L — ABNORMAL LOW (ref 98–111)
Creatinine, Ser: 0.62 mg/dL (ref 0.44–1.00)
GFR, Estimated: >60 mL/min (ref >60)
Glucose, Bld: 118 mg/dL — ABNORMAL HIGH (ref 70–99)
Potassium: 2.8 mmol/L — ABNORMAL LOW (ref 3.5–5.1)
Sodium: 133 mmol/L — ABNORMAL LOW (ref 135–145)
Total Bilirubin: 0.4 mg/dL (ref 0.3–1.2)
Total Protein: 7.5 g/dL (ref 6.5–8.1)

## 2020-08-20 LAB — TSH: TSH: 4.038 u[IU]/mL (ref 0.350–4.500)

## 2020-08-20 MED ORDER — HEPARIN SOD (PORK) LOCK FLUSH 100 UNIT/ML IV SOLN
500.0000 [IU] | Freq: Once | INTRAVENOUS | Status: AC
  Administered 2020-08-20: 500 [IU] via INTRAVENOUS
  Filled 2020-08-20: qty 5

## 2020-08-20 MED ORDER — FAMOTIDINE 20 MG IN NS 100 ML IVPB
20.0000 mg | Freq: Once | INTRAVENOUS | Status: AC
  Administered 2020-08-20: 20 mg via INTRAVENOUS
  Filled 2020-08-20: qty 20

## 2020-08-20 MED ORDER — SODIUM CHLORIDE 0.9 % IV SOLN
200.0000 mg | Freq: Once | INTRAVENOUS | Status: AC
  Administered 2020-08-20: 200 mg via INTRAVENOUS
  Filled 2020-08-20: qty 8

## 2020-08-20 MED ORDER — HEPARIN SOD (PORK) LOCK FLUSH 100 UNIT/ML IV SOLN
500.0000 [IU] | Freq: Once | INTRAVENOUS | Status: DC | PRN
  Filled 2020-08-20: qty 5

## 2020-08-20 MED ORDER — PACLITAXEL PROTEIN-BOUND CHEMO INJECTION 100 MG
100.0000 mg/m2 | Freq: Once | INTRAVENOUS | Status: AC
Start: 2020-08-20 — End: 2020-08-20
  Administered 2020-08-20: 175 mg via INTRAVENOUS
  Filled 2020-08-20: qty 35

## 2020-08-20 MED ORDER — SODIUM CHLORIDE 0.9% FLUSH
10.0000 mL | Freq: Once | INTRAVENOUS | Status: AC
  Administered 2020-08-20: 10 mL via INTRAVENOUS
  Filled 2020-08-20: qty 10

## 2020-08-20 MED ORDER — POTASSIUM CHLORIDE 20 MEQ/100ML IV SOLN
20.0000 meq | Freq: Once | INTRAVENOUS | Status: AC
  Administered 2020-08-20: 20 meq via INTRAVENOUS

## 2020-08-20 MED ORDER — SODIUM CHLORIDE 0.9 % IV SOLN
10.0000 mg | Freq: Once | INTRAVENOUS | Status: AC
  Administered 2020-08-20: 10 mg via INTRAVENOUS
  Filled 2020-08-20: qty 10

## 2020-08-20 MED ORDER — PALONOSETRON HCL INJECTION 0.25 MG/5ML
0.2500 mg | Freq: Once | INTRAVENOUS | Status: AC
  Administered 2020-08-20: 0.25 mg via INTRAVENOUS
  Filled 2020-08-20: qty 5

## 2020-08-20 MED ORDER — SODIUM CHLORIDE 0.9 % IV SOLN
150.0000 mg | Freq: Once | INTRAVENOUS | Status: AC
  Administered 2020-08-20: 150 mg via INTRAVENOUS
  Filled 2020-08-20: qty 150

## 2020-08-20 MED ORDER — POTASSIUM CHLORIDE CRYS ER 20 MEQ PO TBCR: 20.0000 meq | EXTENDED_RELEASE_TABLET | Freq: Two times a day (BID) | ORAL | 0 refills | Status: DC

## 2020-08-20 MED ORDER — HEPARIN SOD (PORK) LOCK FLUSH 100 UNIT/ML IV SOLN
INTRAVENOUS | Status: AC
  Filled 2020-08-20: qty 5

## 2020-08-20 MED ORDER — SODIUM CHLORIDE 0.9 % IV SOLN
Freq: Once | INTRAVENOUS | Status: AC
  Filled 2020-08-20: qty 250

## 2020-08-20 MED ORDER — SODIUM CHLORIDE 0.9 % IV SOLN
615.0000 mg | Freq: Once | INTRAVENOUS | Status: AC
  Administered 2020-08-20: 620 mg via INTRAVENOUS
  Filled 2020-08-20: qty 62

## 2020-08-20 MED ORDER — DIPHENHYDRAMINE HCL 50 MG/ML IJ SOLN
50.0000 mg | Freq: Once | INTRAMUSCULAR | Status: AC
  Administered 2020-08-20: 50 mg via INTRAVENOUS
  Filled 2020-08-20: qty 1

## 2020-08-20 NOTE — Progress Notes (Signed)
Weight changed since initial dosing, asked MD if want to update to current weight.  Use current doses in computer per MD.

## 2020-08-20 NOTE — Assessment & Plan Note (Addendum)
#  Left breast cancer T4 N2 M0-stage III triple negative; s/p Adriamycin Cytoxan every 3 weeks x4; US/mamo- March 25th- partial response noted [currently 4cm; improved LN]  #Given the fair tolerance to above chemotherapy-I think is reasonable to proceed with carboplatin-Keytruda-Abraxane. Labs today reviewed;  acceptable for treatment today. Check TSH.   #Electrolytes :chronic hyponatremia-likely secondary to SIDH/psychiatric medication; sodium today 132.  Stable.  Potassium 2.8; recommend continue 20 mg of potassium at home; add 20 of KCl today.  # schizophrenia: STABLE; Tritnity [phone]; Dr.Ahluwalia].   # Genetics: Discussed regarding genetic testing/counseling.  Given to plan to breast cancer/mother having breast cancer-high suspicion of hereditary cancer syndrome.  We will make a referral.  # DISPOSITION: # genetics referral re: triple negative breast cancer- coordinate with chemo appt # carbo-Abarxane-keytruda; ADD 20 KCL today # 1 week- MD; labs- cbc/bmp; abraxane; possible 20 KCL # 2 weeks MD; labs- cbc/bmp; abraxane; possible 20 KCL- Dr.B

## 2020-08-20 NOTE — Progress Notes (Signed)
[facility] CONSULT NOTE  Patient Care Team: Physicians, Hannah as PCP - General Vladimir Crofts, MD as Consulting Physician (Neurology) Cammie Sickle, MD as Consulting Physician (Internal Medicine) Randel Pigg, MD as Consulting Physician (Psychiatry)  CHIEF COMPLAINTS/PURPOSE OF CONSULTATION: Breast cancer  #  Oncology History Overview Note  # NOV 2021- LEFT BREAST CANCER - [place]; TRIPLE NEGATIVE; Grade of invasive carcinoma: Grade 3; # LYMPH NODE, LEFT AXILLA; [Dr.Cintron]; ULTRASOUND-GUIDED CORE BIOPSY:  - METASTATIC MAMMARY CARCINOMA, AT LEAST 9 MM METASTASIS. ]; DEC 9th PET scan-Intensely hypermetabolic 67.6 cm left breast mass compatible with primary left breast malignancy, with left pectoralis muscle involvement posteriorly and cutaneous involvement by the mass. Multiple smaller hypermetabolic satellite malignant nodules scattered throughout the left breast. Bulky hypermetabolic left axillary and left retropectoral nodal metastases.   # 12/10- Carboplatin ONLY;  Taxol hypersensitivity reaction-discontinued  # 12/17- AC #1 q 3 W  # SURVIVORSHIP:   # GENETICS: p  DIAGNOSIS: Triple negative breast cancer  STAGE: Stage III       ;  GOALS: Cure  CURRENT/MOST RECENT THERAPY : AC    Carcinoma of overlapping sites of left breast in female, estrogen receptor negative ([place])  04/24/2020 Initial Diagnosis   Carcinoma of overlapping sites of left breast in female, estrogen receptor negative ([place])   05/04/2020 - 05/04/2020 Chemotherapy   The patient had dexamethasone (DECADRON) 4 MG tablet, 8 mg, Oral, Daily, 1 of 1 cycle, Start date: --, End date: -- palonosetron (ALOXI) injection 0.25 mg, 0.25 mg, Intravenous,  Once, 1 of 12 cycles Administration: 0.25 mg (05/04/2020) CARBOplatin (PARAPLATIN) 220 mg in sodium chloride 0.9 % 250 mL chemo infusion, 220 mg (100 % of original dose 222.4 mg), Intravenous,  Once, 1 of 12 cycles Dose modification:    (original dose 222.4 mg, Cycle 1) Administration: 220 mg (05/04/2020) PACLitaxel (TAXOL) 132 mg in sodium chloride 0.9 % 250 mL chemo infusion (</= 50m/m2), 80 mg/m2 = 132 mg, Intravenous,  Once, 1 of 12 cycles Administration: 132 mg (05/04/2020)  for chemotherapy treatment.    05/11/2020 - 07/31/2020 Chemotherapy         06/14/2020 Cancer Staging   Staging form: Breast, AJCC 8th Edition - Clinical: Stage IIIC (cT4, cN2b, cM0, G3, ER-, PR-, HER2-) - Signed by BCammie Sickle MD on 06/14/2020   08/20/2020 -  Chemotherapy    Patient is on Treatment Plan: BREAST PEMBROLIZUMAB + CARBOPLATIN D1 + ABRAXANEL D1,8,15 Q21D X 4 CYCLES        HISTORY OF PRESENTING ILLNESS: Patient is accompanied by her mother/patient is poor historian. [name]  female with schizophrenia and left locally advanced triple negative breast cancer T4 N2; currently on neoadjuvant chemotherapy with Adriamycin Cytoxan is here for follow-up to review the results of the mammogram/US.   Patient denies any nausea vomiting.  No diarrhea.  No hospitalizations.   Review of Systems  Unable to perform ROS: Psychiatric disorder     MEDICAL HISTORY:  Past Medical History:  Diagnosis Date  . GERD (gastroesophageal reflux disease)   . IUD (intrauterine device) in place IUD was placed in 2016  . Leiomyoma of uterus   . [place]ia [place]   . Thyroid disease     SURGICAL HISTORY: Past Surgical History:  Procedure Laterality Date  . BREAST BIOPSY Left 03/2020   [facility]- Triple neg- Stage 3  . PORTACATH PLACEMENT Right 05/02/2020   Procedure: INSERTION PORT-A-CATH;  Surgeon: CHerbert Pun MD;  Location:  [facility] ORS;  Service: General;  Laterality: Right;    SOCIAL HISTORY: Social History   Socioeconomic History  . Marital status: Single    Spouse name: Not on file  . Number of children: Not on file  . Years of education: Not on file  . Highest education level: Not on file  Occupational  History  . Not on file  Tobacco Use  . Smoking status: Never Smoker  . Smokeless tobacco: Never Used  Substance and Sexual Activity  . Alcohol use: No  . Drug use: No  . Sexual activity: Yes    Birth control/protection: Surgical  Other Topics Concern  . Not on file  Social History Narrative  . Not on file   Social Determinants of Health   Financial Resource Strain: Not on file  Food Insecurity: Not on file  Transportation Needs: Not on file  Physical Activity: Not on file  Stress: Not on file  Social Connections: Not on file  Intimate Partner Violence: Not on file    FAMILY HISTORY: No family history on file.  ALLERGIES:  is allergic to paclitaxel.  MEDICATIONS:  Current Outpatient Medications  Medication Sig Dispense Refill  . acetaminophen (TYLENOL) 650 MG suppository Place 1 suppository (650 mg total) rectally every 6 (six) hours as needed for fever. 12 suppository 0  . BANOPHEN 25 MG capsule Take 25 mg by mouth every 6 (six) hours as needed for itching.    . benztropine (COGENTIN) 0.5 MG tablet Take 1 tablet (0.5 mg total) by mouth 2 (two) times daily. 60 tablet 1  . cetirizine (ZYRTEC) 10 MG tablet Take 1 tablet (10 mg total) by mouth daily as needed for allergies. (Patient taking differently: Take 10 mg by mouth daily.)    . clonazePAM (KLONOPIN) 0.5 MG tablet Take 0.5 mg by mouth at bedtime.    . divalproex (DEPAKOTE) 500 MG DR tablet Take 1 tablet (500 mg total) by mouth every 12 (twelve) hours. 60 tablet 1  . fluticasone (FLONASE) 50 MCG/ACT nasal spray Place 1 spray into both nostrils daily. 11.1 mL 9  . haloperidol (HALDOL) 5 MG tablet Take 1 tablet (5 mg total) by mouth 3 (three) times daily. 90 tablet 1  . haloperidol decanoate (HALDOL DECANOATE) 100 MG/ML injection Inject 100 mg into the muscle every 28 (twenty-eight) days.    . hydrOXYzine (ATARAX/VISTARIL) 25 MG tablet Take 25 mg by mouth 3 (three) times daily.    Marland Kitchen levothyroxine (SYNTHROID) 88 MCG tablet  Take 1 tablet (88 mcg total) by mouth daily at 6 (six) AM. 30 tablet 1  . lidocaine-prilocaine (EMLA) cream Apply 1 application topically as needed. 30 g 3  . medroxyPROGESTERone (DEPO-PROVERA) 150 MG/ML injection Inject into the muscle.    Marland Kitchen MELATONIN PO Take 1 tablet by mouth at bedtime as needed (sleep).    . ondansetron (ZOFRAN) 8 MG tablet One pill every 8 hours as needed for nausea/vomitting. 40 tablet 1  . prochlorperazine (COMPAZINE) 10 MG tablet Take 1 tablet (10 mg total) by mouth every 6 (six) hours as needed for nausea or vomiting. 40 tablet 1  . sodium chloride 1 g tablet Take 1 tablet (1 g total) by mouth 3 (three) times daily. 90 tablet 0  . potassium chloride SA (KLOR-CON M20) 20 MEQ tablet Take 1 tablet (20 mEq total) by mouth 2 (two) times daily. 60 tablet 0   No current facility-administered medications for this visit.   Facility-Administered Medications Ordered in Other Visits  Medication Dose  Route Frequency Provider Last Rate Last Admin  . heparin lock flush 100 unit/mL  500 Units Intracatheter Once PRN Charlaine Dalton R, MD      . sodium chloride flush (NS) 0.9 % injection 10 mL  10 mL Intravenous PRN Cammie Sickle, MD   10 mL at 06/14/20 0840      .  PHYSICAL EXAMINATION: ECOG PERFORMANCE STATUS: 1 - Symptomatic but completely ambulatory  Vitals:   08/20/20 0843  BP: 129/79  Pulse: 85  Resp: 16  Temp: 98.2 F (36.8 C)  SpO2: 100%   Filed Weights   08/20/20 0843  Weight: 143 lb (64.9 kg)    Physical Exam Constitutional:      Comments: Accompanied by sister.  Ambulating independently.  HENT:     Head: Normocephalic and atraumatic.     Mouth/Throat:     Pharynx: No oropharyngeal exudate.  Eyes:     Pupils: Pupils are equal, round, and reactive to light.  Cardiovascular:     Rate and Rhythm: Normal rate and regular rhythm.  Pulmonary:     Effort: No respiratory distress.     Breath sounds: No wheezing.  Abdominal:     General: Bowel  sounds are normal. There is no distension.     Palpations: Abdomen is soft. There is no mass.     Tenderness: There is no abdominal tenderness. There is no guarding or rebound.  Musculoskeletal:        General: No tenderness. Normal range of motion.     Cervical back: Normal range of motion and neck supple.  Skin:    General: Skin is warm.     Comments: Large left-sided breast mass noted.  However significant improvement of the breast edema; swelling.  Noted to have left axillary node-see imaging below.  Neurological:     Mental Status: She is alert and oriented to person, place, and time.  Psychiatric:        Mood and Affect: Affect normal.     Comments: Poor insight/tangential thoughts.  Unable to make a coherent conversation-underlying schizophrenia.      LABORATORY DATA:  I have reviewed the data as listed Lab Results  Component Value Date   WBC 3.6 (L) 08/20/2020   HGB 10.4 (L) 08/20/2020   HCT 29.4 (L) 08/20/2020   MCV 91.9 08/20/2020   PLT 500 (H) 08/20/2020   Recent Labs    07/30/20 0939 08/08/20 1015 08/20/20 0806  NA 120* 129* 133*  K 3.0* 3.3* 2.8*  CL 84* 94* 94*  CO2 23 24 27   GLUCOSE 92 111* 118*  BUN <5* <5* <5*  CREATININE 0.41* 0.53 0.62  CALCIUM 8.3* 9.1 9.5  GFRNONAA >60 >60 >60  PROT 7.3 7.8 7.5  ALBUMIN 3.6 3.9 3.9  AST 19 14* 19  ALT 9 10 13   ALKPHOS 60 63 57  BILITOT 0.4 0.8 0.4    RADIOGRAPHIC STUDIES: I have personally reviewed the radiological images as listed and agreed with the findings in the report. US Breast Limited Uni Left Inc Axilla  Result Date: 08/17/2020 CLINICAL DATA:  50 year old female for follow-up of known LEFT breast cancer and lymph node metastases, currently on neoadjuvant therapy. EXAM: DIGITAL DIAGNOSTIC BILATERAL MAMMOGRAM WITH TOMOSYNTHESIS AND CAD; ULTRASOUND LEFT BREAST LIMITED TECHNIQUE: Bilateral digital diagnostic mammography and breast tomosynthesis was performed. The images were evaluated with computer-aided  detection.; Targeted ultrasound examination of the left breast was performed COMPARISON:  04/03/2020 ultrasound. No prior mammograms are available for comparison. ACR  Breast Density Category b: There are scattered areas of fibroglandular density. FINDINGS: 2D/3D full field views of both breasts demonstrate a large obscured mass within the UPPER OUTER LEFT breast, containing a biopsy clip. Trabecular thickening within the central and UPPER OUTER LEFT breast noted. No suspicious mammographic findings within the RIGHT breast are noted. Targeted ultrasound is performed, showing a 4.4 x 4.2 x 4.5 cm mass/malignancy at the 1 o'clock position of the LEFT breast 4 cm from the nipple, previously 6.3 x 6.2 x 7.1 cm on 04/03/2020. Enlarged LEFT axillary lymph nodes have decreased in size, the largest now with 0.8 cm of cortical thickness, previously 2.6 cm. IMPRESSION: 1. Decreased size of known malignancy within the UPPER-OUTER LEFT breast, now measuring 4.5 cm in greatest diameter, previously 7.1 cm. 2. Decreased size of LEFT axillary lymph nodes, now with largest measuring 0.8 cm of cortical thickness, previously 2.6 cm. 3. No mammographic evidence of RIGHT breast malignancy. RECOMMENDATION: Treatment plan I have discussed the findings and recommendations with the patient. If applicable, a reminder letter will be sent to the patient regarding the next appointment. BI-RADS CATEGORY  6: Known biopsy-proven malignancy. Electronically Signed   By: Margarette Canada M.D.   On: 08/17/2020 10:49   MM DIAG BREAST TOMO BILATERAL  Result Date: 08/17/2020 CLINICAL DATA:  50 year old female for follow-up of known LEFT breast cancer and lymph node metastases, currently on neoadjuvant therapy. EXAM: DIGITAL DIAGNOSTIC BILATERAL MAMMOGRAM WITH TOMOSYNTHESIS AND CAD; ULTRASOUND LEFT BREAST LIMITED TECHNIQUE: Bilateral digital diagnostic mammography and breast tomosynthesis was performed. The images were evaluated with computer-aided  detection.; Targeted ultrasound examination of the left breast was performed COMPARISON:  04/03/2020 ultrasound. No prior mammograms are available for comparison. ACR Breast Density Category b: There are scattered areas of fibroglandular density. FINDINGS: 2D/3D full field views of both breasts demonstrate a large obscured mass within the UPPER OUTER LEFT breast, containing a biopsy clip. Trabecular thickening within the central and UPPER OUTER LEFT breast noted. No suspicious mammographic findings within the RIGHT breast are noted. Targeted ultrasound is performed, showing a 4.4 x 4.2 x 4.5 cm mass/malignancy at the 1 o'clock position of the LEFT breast 4 cm from the nipple, previously 6.3 x 6.2 x 7.1 cm on 04/03/2020. Enlarged LEFT axillary lymph nodes have decreased in size, the largest now with 0.8 cm of cortical thickness, previously 2.6 cm. IMPRESSION: 1. Decreased size of known malignancy within the UPPER-OUTER LEFT breast, now measuring 4.5 cm in greatest diameter, previously 7.1 cm. 2. Decreased size of LEFT axillary lymph nodes, now with largest measuring 0.8 cm of cortical thickness, previously 2.6 cm. 3. No mammographic evidence of RIGHT breast malignancy. RECOMMENDATION: Treatment plan I have discussed the findings and recommendations with the patient. If applicable, a reminder letter will be sent to the patient regarding the next appointment. BI-RADS CATEGORY  6: Known biopsy-proven malignancy. Electronically Signed   By: Margarette Canada M.D.   On: 08/17/2020 10:49      06/04/2020-     07/30/2020-     ASSESSMENT & PLAN:   Carcinoma of overlapping sites of left breast in female, estrogen receptor negative ([place]) #Left breast cancer T4 N2 M0-stage III triple negative; s/p Adriamycin Cytoxan every 3 weeks x4; US/mamo- March 25th- partial response noted [currently 4cm; improved LN]  #Given the fair tolerance to above chemotherapy-I think is reasonable to proceed with  carboplatin-Keytruda-Abraxane. Labs today reviewed;  acceptable for treatment today. Check TSH.   #Electrolytes :chronic hyponatremia-likely secondary to SIDH/psychiatric medication; sodium  today 132.  Stable.  Potassium 2.8; recommend continue 20 mg of potassium at home; add 20 of KCl today.  # schizophrenia: STABLE; [facility] [phone]; Dr.Ahluwalia].   # Genetics: Discussed regarding genetic testing/counseling.  Given to plan to breast cancer/mother having breast cancer-high suspicion of hereditary cancer syndrome.  We will make a referral.  # DISPOSITION: # genetics referral re: triple negative breast cancer- coordinate with chemo appt # carbo-Abarxane-keytruda; ADD 20 KCL today # 1 week- MD; labs- cbc/bmp; abraxane; possible 20 KCL # 2 weeks MD; labs- cbc/bmp; abraxane; possible 20 KCL- Dr.B   All questions were answered. The patient knows to call the clinic with any problems, questions or concerns.    Cammie Sickle, MD 08/20/2020 5:26 PM

## 2020-08-28 ENCOUNTER — Inpatient Hospital Stay: Payer: Medicaid Other

## 2020-08-28 ENCOUNTER — Encounter: Payer: Self-pay | Admitting: Internal Medicine

## 2020-08-28 ENCOUNTER — Inpatient Hospital Stay (HOSPITAL_BASED_OUTPATIENT_CLINIC_OR_DEPARTMENT_OTHER): Payer: Medicaid Other | Admitting: Internal Medicine

## 2020-08-28 ENCOUNTER — Inpatient Hospital Stay: Payer: Medicaid Other | Attending: Internal Medicine

## 2020-08-28 VITALS — BP 86/61 | HR 97 | Temp 97.4°F | Resp 16 | Ht 61.0 in | Wt 145.0 lb

## 2020-08-28 DIAGNOSIS — Z171 Estrogen receptor negative status [ER-]: Secondary | ICD-10-CM | POA: Diagnosis not present

## 2020-08-28 DIAGNOSIS — E876 Hypokalemia: Secondary | ICD-10-CM | POA: Insufficient documentation

## 2020-08-28 DIAGNOSIS — Z95828 Presence of other vascular implants and grafts: Secondary | ICD-10-CM

## 2020-08-28 DIAGNOSIS — R197 Diarrhea, unspecified: Secondary | ICD-10-CM | POA: Insufficient documentation

## 2020-08-28 DIAGNOSIS — E079 Disorder of thyroid, unspecified: Secondary | ICD-10-CM | POA: Diagnosis not present

## 2020-08-28 DIAGNOSIS — Z79899 Other long term (current) drug therapy: Secondary | ICD-10-CM | POA: Insufficient documentation

## 2020-08-28 DIAGNOSIS — Z5112 Encounter for antineoplastic immunotherapy: Secondary | ICD-10-CM | POA: Insufficient documentation

## 2020-08-28 DIAGNOSIS — C50812 Malignant neoplasm of overlapping sites of left female breast: Secondary | ICD-10-CM

## 2020-08-28 DIAGNOSIS — E871 Hypo-osmolality and hyponatremia: Secondary | ICD-10-CM | POA: Insufficient documentation

## 2020-08-28 DIAGNOSIS — F209 Schizophrenia, unspecified: Secondary | ICD-10-CM | POA: Diagnosis not present

## 2020-08-28 DIAGNOSIS — R110 Nausea: Secondary | ICD-10-CM | POA: Diagnosis not present

## 2020-08-28 DIAGNOSIS — K219 Gastro-esophageal reflux disease without esophagitis: Secondary | ICD-10-CM | POA: Insufficient documentation

## 2020-08-28 LAB — CBC WITH DIFFERENTIAL/PLATELET
Abs Immature Granulocytes: 0.03 10*3/uL (ref 0.00–0.07)
Basophils Absolute: 0 10*3/uL (ref 0.0–0.1)
Basophils Relative: 1 %
Eosinophils Absolute: 0 10*3/uL (ref 0.0–0.5)
Eosinophils Relative: 0 %
HCT: 27.4 % — ABNORMAL LOW (ref 36.0–46.0)
Hemoglobin: 9.9 g/dL — ABNORMAL LOW (ref 12.0–15.0)
Immature Granulocytes: 1 %
Lymphocytes Relative: 22 %
Lymphs Abs: 0.9 10*3/uL (ref 0.7–4.0)
MCH: 32.8 pg (ref 26.0–34.0)
MCHC: 36.1 g/dL — ABNORMAL HIGH (ref 30.0–36.0)
MCV: 90.7 fL (ref 80.0–100.0)
Monocytes Absolute: 0.5 10*3/uL (ref 0.1–1.0)
Monocytes Relative: 11 %
Neutro Abs: 2.7 10*3/uL (ref 1.7–7.7)
Neutrophils Relative %: 65 %
Platelets: 313 10*3/uL (ref 150–400)
RBC: 3.02 MIL/uL — ABNORMAL LOW (ref 3.87–5.11)
RDW: 14 % (ref 11.5–15.5)
Smear Review: NORMAL
WBC: 4.2 10*3/uL (ref 4.0–10.5)
nRBC: 0 % (ref 0.0–0.2)

## 2020-08-28 LAB — COMPREHENSIVE METABOLIC PANEL
ALT: 15 U/L (ref 0–44)
AST: 23 U/L (ref 15–41)
Albumin: 3.9 g/dL (ref 3.5–5.0)
Alkaline Phosphatase: 56 U/L (ref 38–126)
Anion gap: 12 (ref 5–15)
BUN: <5 mg/dL — ABNORMAL LOW (ref 6–20)
CO2: 28 mmol/L (ref 22–32)
Calcium: 9.4 mg/dL (ref 8.9–10.3)
Chloride: 90 mmol/L — ABNORMAL LOW (ref 98–111)
Creatinine, Ser: 0.48 mg/dL (ref 0.44–1.00)
GFR, Estimated: >60 mL/min (ref >60)
Glucose, Bld: 105 mg/dL — ABNORMAL HIGH (ref 70–99)
Potassium: 2.5 mmol/L — CL (ref 3.5–5.1)
Sodium: 130 mmol/L — ABNORMAL LOW (ref 135–145)
Total Bilirubin: 0.3 mg/dL (ref 0.3–1.2)
Total Protein: 7.5 g/dL (ref 6.5–8.1)

## 2020-08-28 MED ORDER — HEPARIN SOD (PORK) LOCK FLUSH 100 UNIT/ML IV SOLN
INTRAVENOUS | Status: AC
  Filled 2020-08-28: qty 5

## 2020-08-28 MED ORDER — SODIUM CHLORIDE 0.9 % IV SOLN
Freq: Once | INTRAVENOUS | Status: AC
  Filled 2020-08-28: qty 250

## 2020-08-28 MED ORDER — SODIUM CHLORIDE 0.9% FLUSH
10.0000 mL | Freq: Once | INTRAVENOUS | Status: AC
  Administered 2020-08-28: 10 mL via INTRAVENOUS
  Filled 2020-08-28: qty 10

## 2020-08-28 MED ORDER — POTASSIUM CHLORIDE 20 MEQ/100ML IV SOLN
20.0000 meq | Freq: Once | INTRAVENOUS | Status: AC
Start: 2020-08-28 — End: 2020-08-28
  Administered 2020-08-28: 20 meq via INTRAVENOUS

## 2020-08-28 MED ORDER — DIPHENOXYLATE-ATROPINE 2.5-0.025 MG PO TABS: 1.0000 | ORAL_TABLET | Freq: Four times a day (QID) | ORAL | 0 refills | Status: DC | PRN

## 2020-08-28 MED ORDER — HEPARIN SOD (PORK) LOCK FLUSH 100 UNIT/ML IV SOLN
500.0000 [IU] | Freq: Once | INTRAVENOUS | Status: AC
  Administered 2020-08-28: 500 [IU] via INTRAVENOUS
  Filled 2020-08-28: qty 5

## 2020-08-28 NOTE — Progress Notes (Signed)
[facility] CONSULT NOTE  Patient Care Team: Physicians, [place] as PCP - General Vladimir Crofts, MD as Consulting Physician (Neurology) Cammie Sickle, MD as Consulting Physician (Internal Medicine) Randel Pigg, MD as Consulting Physician (Psychiatry)  CHIEF COMPLAINTS/PURPOSE OF CONSULTATION: Breast cancer  #  Oncology History Overview Note  # NOV 2021- LEFT BREAST CANCER - [place]; TRIPLE NEGATIVE; Grade of invasive carcinoma: Grade 3; # LYMPH NODE, LEFT AXILLA; [Dr.Cintron]; ULTRASOUND-GUIDED CORE BIOPSY:  - METASTATIC MAMMARY CARCINOMA, AT LEAST 9 MM METASTASIS. ]; DEC 9th PET scan-Intensely hypermetabolic 16.1 cm left breast mass compatible with primary left breast malignancy, with left pectoralis muscle involvement posteriorly and cutaneous involvement by the mass. Multiple smaller hypermetabolic satellite malignant nodules scattered throughout the left breast. Bulky hypermetabolic left axillary and left retropectoral nodal metastases.   # 12/10- Carboplatin ONLY;  Taxol hypersensitivity reaction-discontinued  # 12/17- AC #1 q 3 W x4; MARCH 28th- CARBO-ABRAXANE-KEYTRUDA  # SURVIVORSHIP:   # GENETICS: p  DIAGNOSIS: Triple negative breast cancer  STAGE: Stage III       ;  GOALS: Cure  CURRENT/MOST RECENT THERAPY : AC    Carcinoma of overlapping sites of left breast in female, estrogen receptor negative ([place])  04/24/2020 Initial Diagnosis   Carcinoma of overlapping sites of left breast in female, estrogen receptor negative ([place])   05/04/2020 - 05/04/2020 Chemotherapy   The patient had dexamethasone (DECADRON) 4 MG tablet, 8 mg, Oral, Daily, 1 of 1 cycle, Start date: --, End date: -- palonosetron (ALOXI) injection 0.25 mg, 0.25 mg, Intravenous,  Once, 1 of 12 cycles Administration: 0.25 mg (05/04/2020) CARBOplatin (PARAPLATIN) 220 mg in sodium chloride 0.9 % 250 mL chemo infusion, 220 mg (100 % of original dose 222.4 mg), Intravenous,  Once, 1  of 12 cycles Dose modification:   (original dose 222.4 mg, Cycle 1) Administration: 220 mg (05/04/2020) PACLitaxel (TAXOL) 132 mg in sodium chloride 0.9 % 250 mL chemo infusion (</= 66m/m2), 80 mg/m2 = 132 mg, Intravenous,  Once, 1 of 12 cycles Administration: 132 mg (05/04/2020)  for chemotherapy treatment.    05/11/2020 - 07/31/2020 Chemotherapy         06/14/2020 Cancer Staging   Staging form: Breast, AJCC 8th Edition - Clinical: Stage IIIC (cT4, cN2b, cM0, G3, ER-, PR-, HER2-) - Signed by BCammie Sickle MD on 06/14/2020   08/20/2020 -  Chemotherapy    Patient is on Treatment Plan: BREAST PEMBROLIZUMAB + CARBOPLATIN D1 + ABRAXANEL D1,8,15 Q21D X 4 CYCLES        HISTORY OF PRESENTING ILLNESS: Patient is accompanied by her mother/patient is poor historian. [name]  female with schizophrenia and left locally advanced triple negative breast cancer T4 N2; currently on neoadjuvant chemotherapy with carboplatin and Abraxane Keytruda cycle number 1 day 1  is here for follow-up.  Patient notes multiple loose stools over the last 3 days.  Denies abdominal cramps.  Intermittent nausea with vomiting.  No blood in stools.  Feels weak.  Positive for dizziness.  No falls.  Review of Systems  Unable to perform ROS: Psychiatric disorder     MEDICAL HISTORY:  Past Medical History:  Diagnosis Date  . GERD (gastroesophageal reflux disease)   . IUD (intrauterine device) in place IUD was placed in 2016  . Leiomyoma of uterus   . [place]   . Thyroid disease     SURGICAL HISTORY: Past Surgical History:  Procedure Laterality Date  . BREAST BIOPSY Left  03/2020   [facility]- Triple neg- Stage 3  . PORTACATH PLACEMENT Right 05/02/2020   Procedure: INSERTION PORT-A-CATH;  Surgeon: Herbert Pun, MD;  Location: [facility] ORS;  Service: General;  Laterality: Right;    SOCIAL HISTORY: Social History   Socioeconomic History  . Marital status: Single    Spouse  name: Not on file  . Number of children: Not on file  . Years of education: Not on file  . Highest education level: Not on file  Occupational History  . Not on file  Tobacco Use  . Smoking status: Never Smoker  . Smokeless tobacco: Never Used  Substance and Sexual Activity  . Alcohol use: No  . Drug use: No  . Sexual activity: Yes    Birth control/protection: Surgical  Other Topics Concern  . Not on file  Social History Narrative  . Not on file   Social Determinants of Health   Financial Resource Strain: Not on file  Food Insecurity: Not on file  Transportation Needs: Not on file  Physical Activity: Not on file  Stress: Not on file  Social Connections: Not on file  Intimate Partner Violence: Not on file    FAMILY HISTORY: History reviewed. No pertinent family history.  ALLERGIES:  is allergic to paclitaxel.  MEDICATIONS:  Current Outpatient Medications  Medication Sig Dispense Refill  . acetaminophen (TYLENOL) 650 MG suppository Place 1 suppository (650 mg total) rectally every 6 (six) hours as needed for fever. 12 suppository 0  . BANOPHEN 25 MG capsule Take 25 mg by mouth every 6 (six) hours as needed for itching.    . benztropine (COGENTIN) 0.5 MG tablet Take 1 tablet (0.5 mg total) by mouth 2 (two) times daily. 60 tablet 1  . cetirizine (ZYRTEC) 10 MG tablet Take 1 tablet (10 mg total) by mouth daily as needed for allergies. (Patient taking differently: Take 10 mg by mouth daily.)    . clonazePAM (KLONOPIN) 0.5 MG tablet Take 0.5 mg by mouth at bedtime.    . diphenoxylate-atropine (LOMOTIL) 2.5-0.025 MG tablet Take 1 tablet by mouth 4 (four) times daily as needed for diarrhea or loose stools. Take it along with immodium 45 tablet 0  . divalproex (DEPAKOTE) 500 MG DR tablet Take 1 tablet (500 mg total) by mouth every 12 (twelve) hours. 60 tablet 1  . fluticasone (FLONASE) 50 MCG/ACT nasal spray Place 1 spray into both nostrils daily. 11.1 mL 9  . haloperidol (HALDOL)  5 MG tablet Take 1 tablet (5 mg total) by mouth 3 (three) times daily. 90 tablet 1  . haloperidol decanoate (HALDOL DECANOATE) 100 MG/ML injection Inject 100 mg into the muscle every 28 (twenty-eight) days.    . hydrOXYzine (ATARAX/VISTARIL) 25 MG tablet Take 25 mg by mouth 3 (three) times daily.    Marland Kitchen levothyroxine (SYNTHROID) 88 MCG tablet Take 1 tablet (88 mcg total) by mouth daily at 6 (six) AM. 30 tablet 1  . lidocaine-prilocaine (EMLA) cream Apply 1 application topically as needed. 30 g 3  . medroxyPROGESTERone (DEPO-PROVERA) 150 MG/ML injection Inject into the muscle.    Marland Kitchen MELATONIN PO Take 1 tablet by mouth at bedtime as needed (sleep).    . ondansetron (ZOFRAN) 8 MG tablet One pill every 8 hours as needed for nausea/vomitting. 40 tablet 1  . potassium chloride SA (KLOR-CON M20) 20 MEQ tablet Take 1 tablet (20 mEq total) by mouth 2 (two) times daily. 60 tablet 0  . prochlorperazine (COMPAZINE) 10 MG tablet Take 1 tablet (10 mg  total) by mouth every 6 (six) hours as needed for nausea or vomiting. 40 tablet 1  . sodium chloride 1 g tablet Take 1 tablet (1 g total) by mouth 3 (three) times daily. 90 tablet 0   No current facility-administered medications for this visit.   Facility-Administered Medications Ordered in Other Visits  Medication Dose Route Frequency Provider Last Rate Last Admin  . heparin lock flush 100 unit/mL  500 Units Intravenous Once Charlaine Dalton R, MD      . potassium chloride 20 mEq in 100 mL IVPB  20 mEq Intravenous Once Charlaine Dalton R, MD 100 mL/hr at 08/28/20 0957 20 mEq at 08/28/20 0957  . sodium chloride flush (NS) 0.9 % injection 10 mL  10 mL Intravenous PRN Cammie Sickle, MD   10 mL at 06/14/20 0840      .  PHYSICAL EXAMINATION: ECOG PERFORMANCE STATUS: 1 - Symptomatic but completely ambulatory  Vitals:   08/28/20 0837  BP: (!) 86/61  Pulse: 97  Resp: 16  Temp: (!) 97.4 F (36.3 C)  SpO2: 99%   Filed Weights   08/28/20 0837   Weight: 145 lb (65.8 kg)    Physical Exam Constitutional:      Comments: Accompanied by sister.  Ambulating independently.  HENT:     Head: Normocephalic and atraumatic.     Mouth/Throat:     Pharynx: No oropharyngeal exudate.  Eyes:     Pupils: Pupils are equal, round, and reactive to light.  Cardiovascular:     Rate and Rhythm: Normal rate and regular rhythm.  Pulmonary:     Effort: No respiratory distress.     Breath sounds: No wheezing.  Abdominal:     General: Bowel sounds are normal. There is no distension.     Palpations: Abdomen is soft. There is no mass.     Tenderness: There is no abdominal tenderness. There is no guarding or rebound.  Musculoskeletal:        General: No tenderness. Normal range of motion.     Cervical back: Normal range of motion and neck supple.  Skin:    General: Skin is warm.     Comments: Large left-sided breast mass noted.  However significant improvement of the breast edema; swelling.  Noted to have left axillary node-see imaging below.  Neurological:     Mental Status: She is alert and oriented to person, place, and time.  Psychiatric:        Mood and Affect: Affect normal.     Comments: Poor insight/tangential thoughts.  Unable to make a coherent conversation-underlying schizophrenia.      LABORATORY DATA:  I have reviewed the data as listed Lab Results  Component Value Date   WBC 4.2 08/28/2020   HGB 9.9 (L) 08/28/2020   HCT 27.4 (L) 08/28/2020   MCV 90.7 08/28/2020   PLT 313 08/28/2020   Recent Labs    08/08/20 1015 08/20/20 0806 08/28/20 0823  NA 129* 133* 130*  K 3.3* 2.8* 2.5*  CL 94* 94* 90*  CO2 24 27 28   GLUCOSE 111* 118* 105*  BUN <5* <5* <5*  CREATININE 0.53 0.62 0.48  CALCIUM 9.1 9.5 9.4  GFRNONAA >60 >60 >60  PROT 7.8 7.5 7.5  ALBUMIN 3.9 3.9 3.9  AST 14* 19 23  ALT 10 13 15   ALKPHOS 63 57 56  BILITOT 0.8 0.4 0.3    RADIOGRAPHIC STUDIES: I have personally reviewed the radiological images as listed  and agreed with the  findings in the report. US Breast Limited Uni Left Inc Axilla  Result Date: 08/17/2020 CLINICAL DATA:  50 year old female for follow-up of known LEFT breast cancer and lymph node metastases, currently on neoadjuvant therapy. EXAM: DIGITAL DIAGNOSTIC BILATERAL MAMMOGRAM WITH TOMOSYNTHESIS AND CAD; ULTRASOUND LEFT BREAST LIMITED TECHNIQUE: Bilateral digital diagnostic mammography and breast tomosynthesis was performed. The images were evaluated with computer-aided detection.; Targeted ultrasound examination of the left breast was performed COMPARISON:  04/03/2020 ultrasound. No prior mammograms are available for comparison. ACR Breast Density Category b: There are scattered areas of fibroglandular density. FINDINGS: 2D/3D full field views of both breasts demonstrate a large obscured mass within the UPPER OUTER LEFT breast, containing a biopsy clip. Trabecular thickening within the central and UPPER OUTER LEFT breast noted. No suspicious mammographic findings within the RIGHT breast are noted. Targeted ultrasound is performed, showing a 4.4 x 4.2 x 4.5 cm mass/malignancy at the 1 o'clock position of the LEFT breast 4 cm from the nipple, previously 6.3 x 6.2 x 7.1 cm on 04/03/2020. Enlarged LEFT axillary lymph nodes have decreased in size, the largest now with 0.8 cm of cortical thickness, previously 2.6 cm. IMPRESSION: 1. Decreased size of known malignancy within the UPPER-OUTER LEFT breast, now measuring 4.5 cm in greatest diameter, previously 7.1 cm. 2. Decreased size of LEFT axillary lymph nodes, now with largest measuring 0.8 cm of cortical thickness, previously 2.6 cm. 3. No mammographic evidence of RIGHT breast malignancy. RECOMMENDATION: Treatment plan I have discussed the findings and recommendations with the patient. If applicable, a reminder letter will be sent to the patient regarding the next appointment. BI-RADS CATEGORY  6: Known biopsy-proven malignancy. Electronically Signed   By:  Margarette Canada M.D.   On: 08/17/2020 10:49   MM DIAG BREAST TOMO BILATERAL  Result Date: 08/17/2020 CLINICAL DATA:  50 year old female for follow-up of known LEFT breast cancer and lymph node metastases, currently on neoadjuvant therapy. EXAM: DIGITAL DIAGNOSTIC BILATERAL MAMMOGRAM WITH TOMOSYNTHESIS AND CAD; ULTRASOUND LEFT BREAST LIMITED TECHNIQUE: Bilateral digital diagnostic mammography and breast tomosynthesis was performed. The images were evaluated with computer-aided detection.; Targeted ultrasound examination of the left breast was performed COMPARISON:  04/03/2020 ultrasound. No prior mammograms are available for comparison. ACR Breast Density Category b: There are scattered areas of fibroglandular density. FINDINGS: 2D/3D full field views of both breasts demonstrate a large obscured mass within the UPPER OUTER LEFT breast, containing a biopsy clip. Trabecular thickening within the central and UPPER OUTER LEFT breast noted. No suspicious mammographic findings within the RIGHT breast are noted. Targeted ultrasound is performed, showing a 4.4 x 4.2 x 4.5 cm mass/malignancy at the 1 o'clock position of the LEFT breast 4 cm from the nipple, previously 6.3 x 6.2 x 7.1 cm on 04/03/2020. Enlarged LEFT axillary lymph nodes have decreased in size, the largest now with 0.8 cm of cortical thickness, previously 2.6 cm. IMPRESSION: 1. Decreased size of known malignancy within the UPPER-OUTER LEFT breast, now measuring 4.5 cm in greatest diameter, previously 7.1 cm. 2. Decreased size of LEFT axillary lymph nodes, now with largest measuring 0.8 cm of cortical thickness, previously 2.6 cm. 3. No mammographic evidence of RIGHT breast malignancy. RECOMMENDATION: Treatment plan I have discussed the findings and recommendations with the patient. If applicable, a reminder letter will be sent to the patient regarding the next appointment. BI-RADS CATEGORY  6: Known biopsy-proven malignancy. Electronically Signed   By: Margarette Canada M.D.   On: 08/17/2020 10:49      06/04/2020-  07/30/2020-     ASSESSMENT & PLAN:   Carcinoma of overlapping sites of left breast in female, estrogen receptor negative ([place]) #Left breast cancer T4 N2 M0-stage III triple negative; s/p Adriamycin Cytoxan every 3 weeks x4; US/mamo- March 25th- partial response noted [currently 4cm; improved LN]   # s/p carboplatin-Keytruda-Abraxane; second #1 d-1. Hold cycle #1 d-8 today- abraxane sec to diarrhea/ hypokalemia  # GI-Diarrhea/nausea/vomitting- [multiple]-recommend IV fluids.  Discussed regarding Imodium/Lomotil prescription given.  Recommend increase fluid intake at home.  Continue antiemetics.  #Electrolytes :chronic hyponatremia-likely secondary to SIDH/psychiatric medication; sodium today 130 Potassium 2.8; recommend continue 20 mg of potassium at home; add 20 of KCl today.  # schizophrenia: STABLE; Tritnity [phone]; Dr.Ahluwalia].   # Genetics: Discussed regarding genetic testing/counseling.  Given to plan to breast cancer/mother having breast cancer-high suspicion of hereditary cancer syndrome.  We will make a referral.  # DISPOSITION: # genetics referral re: triple negative breast cancer- coordinate with chemo appt # HOLD chemo today; IVFs x1 hour; Kcl 20 meq;  # on 4/06-IVF over 1 hour # as planned 1 weeks MD; labs- cbc/bmp; abraxane; possible 20 KCL # 2  weeks MD; labs- cbc/bmp; abraxane; possible 20 KCL- Dr.B    All questions were answered. The patient knows to call the clinic with any problems, questions or concerns.    Cammie Sickle, MD 08/28/2020 10:55 AM

## 2020-08-28 NOTE — Assessment & Plan Note (Signed)
#  Left breast cancer T4 N2 M0-stage III triple negative; s/p Adriamycin Cytoxan every 3 weeks x4; US/mamo- March 25th- partial response noted [currently 4cm; improved LN]   # s/p carboplatin-Keytruda-Abraxane; second #1 d-1. Hold cycle #1 d-8 today- abraxane sec to diarrhea/ hypokalemia  # GI-Diarrhea/nausea/vomitting- [multiple]-recommend IV fluids.  Discussed regarding Imodium/Lomotil prescription given.  Recommend increase fluid intake at home.  Continue antiemetics.  #Electrolytes :chronic hyponatremia-likely secondary to SIDH/psychiatric medication; sodium today 130 Potassium 2.8; recommend continue 20 mg of potassium at home; add 20 of KCl today.  # schizophrenia: STABLE; Tritnity [phone]; Dr.Ahluwalia].   # Genetics: Discussed regarding genetic testing/counseling.  Given to plan to breast cancer/mother having breast cancer-high suspicion of hereditary cancer syndrome.  We will make a referral.  # DISPOSITION: # genetics referral re: triple negative breast cancer- coordinate with chemo appt # HOLD chemo today; IVFs x1 hour; Kcl 20 meq;  # on 4/06-IVF over 1 hour # as planned 1 weeks MD; labs- cbc/bmp; abraxane; possible 20 KCL # 2  weeks MD; labs- cbc/bmp; abraxane; possible 20 KCL- Dr.B

## 2020-08-28 NOTE — Progress Notes (Signed)
Diarrhea from treatment. Asking for diapers and chucks to cover bed at night.

## 2020-08-28 NOTE — Patient Instructions (Signed)
#   take immoidum- 1 pill after each episode of diarrhea; along with lomotil.

## 2020-08-29 ENCOUNTER — Inpatient Hospital Stay: Payer: Medicaid Other

## 2020-08-29 ENCOUNTER — Other Ambulatory Visit: Payer: Self-pay | Admitting: *Deleted

## 2020-08-29 DIAGNOSIS — C50812 Malignant neoplasm of overlapping sites of left female breast: Secondary | ICD-10-CM | POA: Diagnosis not present

## 2020-08-29 DIAGNOSIS — Z95828 Presence of other vascular implants and grafts: Secondary | ICD-10-CM

## 2020-08-29 MED ORDER — SODIUM CHLORIDE 0.9 % IV SOLN
Freq: Once | INTRAVENOUS | Status: AC
  Filled 2020-08-29: qty 250

## 2020-08-29 MED ORDER — HEPARIN SOD (PORK) LOCK FLUSH 100 UNIT/ML IV SOLN
INTRAVENOUS | Status: AC
  Filled 2020-08-29: qty 5

## 2020-08-29 MED ORDER — HEPARIN SOD (PORK) LOCK FLUSH 100 UNIT/ML IV SOLN
500.0000 [IU] | Freq: Once | INTRAVENOUS | Status: AC | PRN
  Administered 2020-08-29: 500 [IU]
  Filled 2020-08-29: qty 5

## 2020-08-29 MED ORDER — LIDOCAINE-PRILOCAINE 2.5-2.5 % EX CREA: 1.0000 "application " | TOPICAL_CREAM | CUTANEOUS | 3 refills | Status: AC | PRN

## 2020-09-04 ENCOUNTER — Inpatient Hospital Stay: Payer: Medicaid Other

## 2020-09-04 ENCOUNTER — Other Ambulatory Visit: Payer: Medicaid Other

## 2020-09-04 ENCOUNTER — Encounter: Payer: Self-pay | Admitting: Licensed Clinical Social Worker

## 2020-09-04 ENCOUNTER — Encounter: Payer: Self-pay | Admitting: Internal Medicine

## 2020-09-04 ENCOUNTER — Inpatient Hospital Stay: Payer: Medicaid Other | Admitting: Licensed Clinical Social Worker

## 2020-09-04 ENCOUNTER — Inpatient Hospital Stay (HOSPITAL_BASED_OUTPATIENT_CLINIC_OR_DEPARTMENT_OTHER): Payer: Medicaid Other | Admitting: Internal Medicine

## 2020-09-04 DIAGNOSIS — Z800 Family history of malignant neoplasm of digestive organs: Secondary | ICD-10-CM

## 2020-09-04 DIAGNOSIS — Z171 Estrogen receptor negative status [ER-]: Secondary | ICD-10-CM

## 2020-09-04 DIAGNOSIS — C50812 Malignant neoplasm of overlapping sites of left female breast: Secondary | ICD-10-CM

## 2020-09-04 DIAGNOSIS — Z95828 Presence of other vascular implants and grafts: Secondary | ICD-10-CM

## 2020-09-04 DIAGNOSIS — Z803 Family history of malignant neoplasm of breast: Secondary | ICD-10-CM

## 2020-09-04 LAB — CBC WITH DIFFERENTIAL/PLATELET
Abs Immature Granulocytes: 0.01 10*3/uL (ref 0.00–0.07)
Basophils Absolute: 0 10*3/uL (ref 0.0–0.1)
Basophils Relative: 0 %
Eosinophils Absolute: 0 10*3/uL (ref 0.0–0.5)
Eosinophils Relative: 0 %
HCT: 27 % — ABNORMAL LOW (ref 36.0–46.0)
Hemoglobin: 9.8 g/dL — ABNORMAL LOW (ref 12.0–15.0)
Immature Granulocytes: 0 %
Lymphocytes Relative: 21 %
Lymphs Abs: 0.7 10*3/uL (ref 0.7–4.0)
MCH: 33 pg (ref 26.0–34.0)
MCHC: 36.3 g/dL — ABNORMAL HIGH (ref 30.0–36.0)
MCV: 90.9 fL (ref 80.0–100.0)
Monocytes Absolute: 1.2 10*3/uL — ABNORMAL HIGH (ref 0.1–1.0)
Monocytes Relative: 35 %
Neutro Abs: 1.5 10*3/uL — ABNORMAL LOW (ref 1.7–7.7)
Neutrophils Relative %: 44 %
Platelets: 188 10*3/uL (ref 150–400)
RBC: 2.97 MIL/uL — ABNORMAL LOW (ref 3.87–5.11)
RDW: 15.4 % (ref 11.5–15.5)
WBC: 3.4 10*3/uL — ABNORMAL LOW (ref 4.0–10.5)
nRBC: 0 % (ref 0.0–0.2)

## 2020-09-04 LAB — BASIC METABOLIC PANEL
Anion gap: 10 (ref 5–15)
BUN: <5 mg/dL — ABNORMAL LOW (ref 6–20)
CO2: 25 mmol/L (ref 22–32)
Calcium: 9 mg/dL (ref 8.9–10.3)
Chloride: 89 mmol/L — ABNORMAL LOW (ref 98–111)
Creatinine, Ser: 0.49 mg/dL (ref 0.44–1.00)
GFR, Estimated: >60 mL/min (ref >60)
Glucose, Bld: 97 mg/dL (ref 70–99)
Potassium: 2.7 mmol/L — CL (ref 3.5–5.1)
Sodium: 124 mmol/L — ABNORMAL LOW (ref 135–145)

## 2020-09-04 MED ORDER — LEVOTHYROXINE SODIUM 88 MCG PO TABS: 88.0000 ug | ORAL_TABLET | Freq: Every day | ORAL | 1 refills | Status: DC

## 2020-09-04 MED ORDER — PROCHLORPERAZINE MALEATE 10 MG PO TABS: 10.0000 mg | ORAL_TABLET | Freq: Four times a day (QID) | ORAL | 1 refills | Status: DC | PRN

## 2020-09-04 MED ORDER — ONDANSETRON HCL 8 MG PO TABS: ORAL_TABLET | ORAL | 1 refills | Status: DC

## 2020-09-04 MED ORDER — POTASSIUM CHLORIDE CRYS ER 20 MEQ PO TBCR: 20.0000 meq | EXTENDED_RELEASE_TABLET | Freq: Two times a day (BID) | ORAL | 0 refills | Status: DC

## 2020-09-04 MED ORDER — HEPARIN SOD (PORK) LOCK FLUSH 100 UNIT/ML IV SOLN
500.0000 [IU] | Freq: Once | INTRAVENOUS | Status: AC
Start: 2020-09-04 — End: 2020-09-04
  Administered 2020-09-04: 500 [IU] via INTRAVENOUS
  Filled 2020-09-04: qty 5

## 2020-09-04 MED ORDER — POTASSIUM CHLORIDE 20 MEQ/100ML IV SOLN
20.0000 meq | Freq: Once | INTRAVENOUS | Status: AC
  Administered 2020-09-04: 20 meq via INTRAVENOUS

## 2020-09-04 MED ORDER — HYDROXYZINE HCL 25 MG PO TABS: 25.0000 mg | ORAL_TABLET | Freq: Three times a day (TID) | ORAL | 1 refills | Status: DC | PRN

## 2020-09-04 MED ORDER — SODIUM CHLORIDE 0.9 % IV SOLN
Freq: Once | INTRAVENOUS | Status: AC
  Filled 2020-09-04: qty 250

## 2020-09-04 MED ORDER — HEPARIN SOD (PORK) LOCK FLUSH 100 UNIT/ML IV SOLN
500.0000 [IU] | Freq: Once | INTRAVENOUS | Status: DC | PRN
  Filled 2020-09-04: qty 5

## 2020-09-04 MED ORDER — SODIUM CHLORIDE 0.9% FLUSH
10.0000 mL | Freq: Once | INTRAVENOUS | Status: AC
  Administered 2020-09-04: 10 mL via INTRAVENOUS
  Filled 2020-09-04: qty 10

## 2020-09-04 NOTE — Progress Notes (Signed)
[name] CONSULT NOTE  Patient Care Team: Physicians, [place] as PCP - General Vladimir Crofts, MD as Consulting Physician (Neurology) Cammie Sickle, MD as Consulting Physician (Internal Medicine) Randel Pigg, MD as Consulting Physician (Psychiatry)  CHIEF COMPLAINTS/PURPOSE OF CONSULTATION: Breast cancer  #  Oncology History Overview Note  # NOV 2021- LEFT BREAST CANCER - [place]; TRIPLE NEGATIVE; Grade of invasive carcinoma: Grade 3; # LYMPH NODE, LEFT AXILLA; [Dr.Cintron]; ULTRASOUND-GUIDED CORE BIOPSY:  - METASTATIC MAMMARY CARCINOMA, AT LEAST 9 MM METASTASIS. ]; DEC 9th PET scan-Intensely hypermetabolic 57.2 cm left breast mass compatible with primary left breast malignancy, with left pectoralis muscle involvement posteriorly and cutaneous involvement by the mass. Multiple smaller hypermetabolic satellite malignant nodules scattered throughout the left breast. Bulky hypermetabolic left axillary and left retropectoral nodal metastases.   # 12/10- Carboplatin ONLY;  Taxol hypersensitivity reaction-discontinued  # 12/17- AC #1 q 3 W x4; MARCH 28th- CARBO-ABRAXANE-KEYTRUDA  # SURVIVORSHIP:   # GENETICS: p  DIAGNOSIS: Triple negative breast cancer  STAGE: Stage III       ;  GOALS: Cure  CURRENT/MOST RECENT THERAPY : AC    Carcinoma of overlapping sites of left breast in female, estrogen receptor negative ([place])  04/24/2020 Initial Diagnosis   Carcinoma of overlapping sites of left breast in female, estrogen receptor negative ([place])   05/04/2020 - 05/04/2020 Chemotherapy   The patient had dexamethasone (DECADRON) 4 MG tablet, 8 mg, Oral, Daily, 1 of 1 cycle, Start date: --, End date: -- palonosetron (ALOXI) injection 0.25 mg, 0.25 mg, Intravenous,  Once, 1 of 12 cycles Administration: 0.25 mg (05/04/2020) CARBOplatin (PARAPLATIN) 220 mg in sodium chloride 0.9 % 250 mL chemo infusion, 220 mg (100 % of original dose 222.4 mg), Intravenous,  Once, 1  of 12 cycles Dose modification:   (original dose 222.4 mg, Cycle 1) Administration: 220 mg (05/04/2020) PACLitaxel (TAXOL) 132 mg in sodium chloride 0.9 % 250 mL chemo infusion (</= 31m/m2), 80 mg/m2 = 132 mg, Intravenous,  Once, 1 of 12 cycles Administration: 132 mg (05/04/2020)  for chemotherapy treatment.    05/11/2020 - 07/31/2020 Chemotherapy         06/14/2020 Cancer Staging   Staging form: Breast, AJCC 8th Edition - Clinical: Stage IIIC (cT4, cN2b, cM0, G3, ER-, PR-, HER2-) - Signed by BCammie Sickle MD on 06/14/2020   08/20/2020 -  Chemotherapy    Patient is on Treatment Plan: BREAST PEMBROLIZUMAB + CARBOPLATIN D1 + ABRAXANEL D1,8,15 Q21D X 4 CYCLES        HISTORY OF PRESENTING ILLNESS: Patient is accompanied by her mother/patient is poor historian. [name].  female with schizophrenia and left locally advanced triple negative breast cancer T4 N2; currently on neoadjuvant chemotherapy with carboplatin and Abraxane Keytruda cycle number 1 day 1  is here for follow-up.  [name] day #8 cycle #1-Abraxane was held approximately 1 week ago because of ongoing diarrhea.  Patient diarrhea is improved since taking Imodium/Lomotil.  No nausea no vomiting.  No dizzy spells.  No falls.  Review of Systems  Unable to perform ROS: Psychiatric disorder     MEDICAL HISTORY:  Past Medical History:  Diagnosis Date  . Family history of breast cancer   . Family history of stomach cancer   . Family history of stomach cancer   . GERD (gastroesophageal reflux disease)   . IUD (intrauterine device) in place IUD was placed in 2016  . Leiomyoma of uterus   .  Schizophrenia ([place])   . Thyroid disease     SURGICAL HISTORY: Past Surgical History:  Procedure Laterality Date  . BREAST BIOPSY Left 03/2020   [facility]- Triple neg- Stage 3  . PORTACATH PLACEMENT Right 05/02/2020   Procedure: INSERTION PORT-A-CATH;  Surgeon: Herbert Pun, MD;  Location: [facility] ORS;   Service: General;  Laterality: Right;    SOCIAL HISTORY: Social History   Socioeconomic History  . Marital status: Single    Spouse name: Not on file  . Number of children: Not on file  . Years of education: Not on file  . Highest education level: Not on file  Occupational History  . Not on file  Tobacco Use  . Smoking status: Never Smoker  . Smokeless tobacco: Never Used  Substance and Sexual Activity  . Alcohol use: No  . Drug use: No  . Sexual activity: Yes    Birth control/protection: Surgical  Other Topics Concern  . Not on file  Social History Narrative  . Not on file   Social Determinants of Health   Financial Resource Strain: Not on file  Food Insecurity: Not on file  Transportation Needs: Not on file  Physical Activity: Not on file  Stress: Not on file  Social Connections: Not on file  Intimate Partner Violence: Not on file    FAMILY HISTORY: Family History  Problem Relation Age of Onset  . Breast cancer Mother   . Stomach cancer Maternal Grandfather   . Other Maternal Aunt        died from tumor, brain?    ALLERGIES:  is allergic to paclitaxel.  MEDICATIONS:  Current Outpatient Medications  Medication Sig Dispense Refill  . acetaminophen (TYLENOL) 650 MG suppository Place 1 suppository (650 mg total) rectally every 6 (six) hours as needed for fever. 12 suppository 0  . cetirizine (ZYRTEC) 10 MG tablet Take 1 tablet (10 mg total) by mouth daily as needed for allergies. (Patient taking differently: Take 10 mg by mouth daily.)    . divalproex (DEPAKOTE) 500 MG DR tablet Take 1 tablet (500 mg total) by mouth every 12 (twelve) hours. 60 tablet 1  . fluticasone (FLONASE) 50 MCG/ACT nasal spray Place 1 spray into both nostrils daily. 11.1 mL 9  . haloperidol (HALDOL) 5 MG tablet Take 1 tablet (5 mg total) by mouth 3 (three) times daily. 90 tablet 1  . haloperidol decanoate (HALDOL DECANOATE) 100 MG/ML injection Inject 100 mg into the muscle every 28  (twenty-eight) days.    Marland Kitchen lidocaine-prilocaine (EMLA) cream Apply 1 application topically as needed. 30 g 3  . medroxyPROGESTERone (DEPO-PROVERA) 150 MG/ML injection Inject into the muscle.    Marland Kitchen MELATONIN PO Take 1 tablet by mouth at bedtime as needed (sleep).    . sodium chloride 1 g tablet Take 1 tablet (1 g total) by mouth 3 (three) times daily. 90 tablet 0  . BANOPHEN 25 MG capsule Take 25 mg by mouth every 6 (six) hours as needed for itching. (Patient not taking: Reported on 09/04/2020)    . benztropine (COGENTIN) 0.5 MG tablet Take 1 tablet (0.5 mg total) by mouth 2 (two) times daily. (Patient not taking: Reported on 09/04/2020) 60 tablet 1  . clonazePAM (KLONOPIN) 0.5 MG tablet Take 0.5 mg by mouth at bedtime. (Patient not taking: Reported on 09/04/2020)    . diphenoxylate-atropine (LOMOTIL) 2.5-0.025 MG tablet Take 1 tablet by mouth 4 (four) times daily as needed for diarrhea or loose stools. Take it along with immodium (Patient not  taking: Reported on 09/04/2020) 45 tablet 0  . hydrOXYzine (ATARAX/VISTARIL) 25 MG tablet Take 1 tablet (25 mg total) by mouth every 8 (eight) hours as needed. 60 tablet 1  . levothyroxine (SYNTHROID) 88 MCG tablet Take 1 tablet (88 mcg total) by mouth daily at 6 (six) AM. 30 tablet 1  . ondansetron (ZOFRAN) 8 MG tablet One pill every 8 hours as needed for nausea/vomitting. 40 tablet 1  . potassium chloride SA (KLOR-CON M20) 20 MEQ tablet Take 1 tablet (20 mEq total) by mouth 2 (two) times daily. 60 tablet 0  . prochlorperazine (COMPAZINE) 10 MG tablet Take 1 tablet (10 mg total) by mouth every 6 (six) hours as needed for nausea or vomiting. 40 tablet 1   No current facility-administered medications for this visit.   Facility-Administered Medications Ordered in Other Visits  Medication Dose Route Frequency Provider Last Rate Last Admin  . sodium chloride flush (NS) 0.9 % injection 10 mL  10 mL Intravenous PRN Cammie Sickle, MD   10 mL at 06/14/20 0840       .  PHYSICAL EXAMINATION: ECOG PERFORMANCE STATUS: 1 - Symptomatic but completely ambulatory  Vitals:   09/04/20 0921  BP: 115/62  Pulse: 93  Resp: 18  Temp: 97.7 F (36.5 C)  SpO2: 100%   Filed Weights   09/04/20 0921  Weight: 149 lb 12.8 oz (67.9 kg)    Physical Exam Constitutional:      Comments: Accompanied by sister.  Ambulating independently.  HENT:     Head: Normocephalic and atraumatic.     Mouth/Throat:     Pharynx: No oropharyngeal exudate.  Eyes:     Pupils: Pupils are equal, round, and reactive to light.  Cardiovascular:     Rate and Rhythm: Normal rate and regular rhythm.  Pulmonary:     Effort: No respiratory distress.     Breath sounds: No wheezing.  Abdominal:     General: Bowel sounds are normal. There is no distension.     Palpations: Abdomen is soft. There is no mass.     Tenderness: There is no abdominal tenderness. There is no guarding or rebound.  Musculoskeletal:        General: No tenderness. Normal range of motion.     Cervical back: Normal range of motion and neck supple.  Skin:    General: Skin is warm.     Comments: Large left-sided breast ~ 5 x 4cm (overall improved).  However significant improvement of the breast edema; improvement of the lymphadenopathy  Neurological:     Mental Status: She is alert and oriented to person, place, and time.  Psychiatric:        Mood and Affect: Affect normal.     Comments: Poor insight/tangential thoughts.  Unable to make a coherent conversation-underlying schizophrenia.      LABORATORY DATA:  I have reviewed the data as listed Lab Results  Component Value Date   WBC 3.4 (L) 09/04/2020   HGB 9.8 (L) 09/04/2020   HCT 27.0 (L) 09/04/2020   MCV 90.9 09/04/2020   PLT 188 09/04/2020   Recent Labs    08/08/20 1015 08/20/20 0806 08/28/20 0823 09/04/20 0900  NA 129* 133* 130* 124*  K 3.3* 2.8* 2.5* 2.7*  CL 94* 94* 90* 89*  CO2 _0 GLUCOSE 111* 118* 105* 97  BUN <5* <5*  <5* <5*  CREATININE 0.53 0.62 0.48 0.49  CALCIUM 9.1 9.5 9.4 9.0  GFRNONAA >60 >60 >60 >  60  PROT 7.8 7.5 7.5  --   ALBUMIN 3.9 3.9 3.9  --   AST 14* 19 23  --   ALT _0 --   ALKPHOS 63 57 56  --   BILITOT 0.8 0.4 0.3  --     RADIOGRAPHIC STUDIES: I have personally reviewed the radiological images as listed and agreed with the findings in the report. US Breast Limited Uni Left Inc Axilla  Result Date: 08/17/2020 CLINICAL DATA:  50 year old female for follow-up of known LEFT breast cancer and lymph node metastases, currently on neoadjuvant therapy. EXAM: DIGITAL DIAGNOSTIC BILATERAL MAMMOGRAM WITH TOMOSYNTHESIS AND CAD; ULTRASOUND LEFT BREAST LIMITED TECHNIQUE: Bilateral digital diagnostic mammography and breast tomosynthesis was performed. The images were evaluated with computer-aided detection.; Targeted ultrasound examination of the left breast was performed COMPARISON:  04/03/2020 ultrasound. No prior mammograms are available for comparison. ACR Breast Density Category b: There are scattered areas of fibroglandular density. FINDINGS: 2D/3D full field views of both breasts demonstrate a large obscured mass within the UPPER OUTER LEFT breast, containing a biopsy clip. Trabecular thickening within the central and UPPER OUTER LEFT breast noted. No suspicious mammographic findings within the RIGHT breast are noted. Targeted ultrasound is performed, showing a 4.4 x 4.2 x 4.5 cm mass/malignancy at the 1 o'clock position of the LEFT breast 4 cm from the nipple, previously 6.3 x 6.2 x 7.1 cm on 04/03/2020. Enlarged LEFT axillary lymph nodes have decreased in size, the largest now with 0.8 cm of cortical thickness, previously 2.6 cm. IMPRESSION: 1. Decreased size of known malignancy within the UPPER-OUTER LEFT breast, now measuring 4.5 cm in greatest diameter, previously 7.1 cm. 2. Decreased size of LEFT axillary lymph nodes, now with largest measuring 0.8 cm of cortical thickness, previously 2.6 cm.  3. No mammographic evidence of RIGHT breast malignancy. RECOMMENDATION: Treatment plan I have discussed the findings and recommendations with the patient. If applicable, a reminder letter will be sent to the patient regarding the next appointment. BI-RADS CATEGORY  6: Known biopsy-proven malignancy. Electronically Signed   By: Margarette Canada M.D.   On: 08/17/2020 10:49   MM DIAG BREAST TOMO BILATERAL  Result Date: 08/17/2020 CLINICAL DATA:  50 year old female for follow-up of known LEFT breast cancer and lymph node metastases, currently on neoadjuvant therapy. EXAM: DIGITAL DIAGNOSTIC BILATERAL MAMMOGRAM WITH TOMOSYNTHESIS AND CAD; ULTRASOUND LEFT BREAST LIMITED TECHNIQUE: Bilateral digital diagnostic mammography and breast tomosynthesis was performed. The images were evaluated with computer-aided detection.; Targeted ultrasound examination of the left breast was performed COMPARISON:  04/03/2020 ultrasound. No prior mammograms are available for comparison. ACR Breast Density Category b: There are scattered areas of fibroglandular density. FINDINGS: 2D/3D full field views of both breasts demonstrate a large obscured mass within the UPPER OUTER LEFT breast, containing a biopsy clip. Trabecular thickening within the central and UPPER OUTER LEFT breast noted. No suspicious mammographic findings within the RIGHT breast are noted. Targeted ultrasound is performed, showing a 4.4 x 4.2 x 4.5 cm mass/malignancy at the 1 o'clock position of the LEFT breast 4 cm from the nipple, previously 6.3 x 6.2 x 7.1 cm on 04/03/2020. Enlarged LEFT axillary lymph nodes have decreased in size, the largest now with 0.8 cm of cortical thickness, previously 2.6 cm. IMPRESSION: 1. Decreased size of known malignancy within the UPPER-OUTER LEFT breast, now measuring 4.5 cm in greatest diameter, previously 7.1 cm. 2. Decreased size of LEFT axillary lymph nodes, now with largest measuring 0.8 cm of cortical thickness, previously 2.6  cm. 3. No  mammographic evidence of RIGHT breast malignancy. RECOMMENDATION: Treatment plan I have discussed the findings and recommendations with the patient. If applicable, a reminder letter will be sent to the patient regarding the next appointment. BI-RADS CATEGORY  6: Known biopsy-proven malignancy. Electronically Signed   By: Margarette Canada M.D.   On: 08/17/2020 10:49      06/04/2020-     07/30/2020-     ASSESSMENT & PLAN:   Carcinoma of overlapping sites of left breast in female, estrogen receptor negative ([place]) #Left breast cancer T4 N2 M0-stage III triple negative; s/p Adriamycin Cytoxan every 3 weeks x4; US/mamo- March 25th- partial response noted [currently 4cm; improved LN]   # s/p carboplatin-Keytruda-Abraxane; second #1 d-1. Continue to Hold cycle #1 d-8today- abraxane sec to hyponatemia/ hypokalemia  # GI-Diarrhea/nausea/vomitting-likely secondary to Abraxane.  Better on imodium/lomotil prn.  Monitor closely  #Electrolytes abnormalities: chronic hyponatremia-likely secondary to [facility]/psychiatric medication; sodium today 124; Potassium 2.7; recommend continue 20 mg of potassium at home; add 20 of KCl today.  Refilled  # schizophrenia: STABLE; [facility] [phone]; Dr.Ahluwalia].   # Genetics: Discussed regarding genetic testing/counseling.  Given to plan to breast cancer/mother having breast cancer-high suspicion of hereditary cancer syndrome; await consultation today.   # DISPOSITION: # HOLD chemo today; IVFs x1 hour; Kcl 20 meq;  # on 4/15-IVF over 1 hour; Kcl 20 meq;  # as planned 1 weeks MD; labs- cbc/bmp; abraxane; possible 20 KCL # 2  weeks MD; labs- cbc/bmp; abraxane; possible 20 KCL- Dr.B    All questions were answered. The patient knows to call the clinic with any problems, questions or concerns.    Cammie Sickle, MD 09/04/2020 8:55 PM

## 2020-09-04 NOTE — Progress Notes (Signed)
Patient here for oncology follow-up appointment, expresses no complaints or concerns at this time.    

## 2020-09-04 NOTE — Assessment & Plan Note (Addendum)
#  Left breast cancer T4 N2 M0-stage III triple negative; s/p Adriamycin Cytoxan every 3 weeks x4; US/mamo- March 25th- partial response noted [currently 4cm; improved LN]   # s/p carboplatin-Keytruda-Abraxane; second #1 d-1. Continue to Hold cycle #1 d-8today- abraxane sec to hyponatemia/ hypokalemia  # GI-Diarrhea/nausea/vomitting-likely secondary to Abraxane.  Better on imodium/lomotil prn.  Monitor closely  #Electrolytes abnormalities: chronic hyponatremia-likely secondary to [facility]/psychiatric medication; sodium today 124; Potassium 2.7; recommend continue 20 mg of potassium at home; add 20 of KCl today.  Refilled  # schizophrenia: STABLE; [facility] [phone]; Dr.Ahluwalia].   # Genetics: Discussed regarding genetic testing/counseling.  Given to plan to breast cancer/mother having breast cancer-high suspicion of hereditary cancer syndrome; await consultation today.   # DISPOSITION: # HOLD chemo today; IVFs x1 hour; Kcl 20 meq;  # on 4/15-IVF over 1 hour; Kcl 20 meq;  # as planned 1 weeks MD; labs- cbc/bmp; abraxane; possible 20 KCL # 2  weeks MD; labs- cbc/bmp; abraxane; possible 20 KCL- Dr.B

## 2020-09-04 NOTE — Progress Notes (Signed)
REFERRING PROVIDER: Cammie Sickle, MD [place],  [place] [address]  PRIMARY PROVIDER:  Physicians, [employer] [facility]  PRIMARY REASON FOR VISIT:  1. Carcinoma of overlapping sites of left breast in female, estrogen receptor negative ([place])   2. Family history of breast cancer   3. Family history of stomach cancer      HISTORY OF PRESENT ILLNESS:   Tamara [name], a 50 y.o. female, was seen for a [facility] cancer genetics consultation at the request of Dr. Rogue Bussing due to a personal and family history of breast cancer.  Tamara [name] presents to clinic today to discuss the possibility of a hereditary predisposition to cancer, genetic testing, and to further clarify her future cancer risks, as well as potential cancer risks for family members.   In 2021, at the age of 69, Tamara [name] was diagnosed with invasive mammary carcinoma of the left breast, triple negative. Treatment plan thus far has included chemotherapy.  CANCER HISTORY:  Oncology History Overview Note  # NOV 2021- LEFT BREAST CANCER - [place]; TRIPLE NEGATIVE; Grade of invasive carcinoma: Grade 3; # LYMPH NODE, LEFT AXILLA; [Dr.Cintron]; ULTRASOUND-GUIDED CORE BIOPSY:  - METASTATIC MAMMARY CARCINOMA, AT LEAST 9 MM METASTASIS. ]; DEC 9th PET scan-Intensely hypermetabolic 32.2 cm left breast mass compatible with primary left breast malignancy, with left pectoralis muscle involvement posteriorly and cutaneous involvement by the mass. Multiple smaller hypermetabolic satellite malignant nodules scattered throughout the left breast. Bulky hypermetabolic left axillary and left retropectoral nodal metastases.   # 12/10- Carboplatin ONLY;  Taxol hypersensitivity reaction-discontinued  # 12/17- AC #1 q 3 W x4; MARCH 28th- CARBO-ABRAXANE-KEYTRUDA  # SURVIVORSHIP:   # GENETICS: p  DIAGNOSIS: Triple negative breast cancer  STAGE: Stage III       ;  GOALS: Cure  CURRENT/MOST RECENT THERAPY : AC    Carcinoma of  overlapping sites of left breast in female, estrogen receptor negative ([place])  04/24/2020 Initial Diagnosis   Carcinoma of overlapping sites of left breast in female, estrogen receptor negative ([place])   05/04/2020 - 05/04/2020 Chemotherapy   The patient had dexamethasone (DECADRON) 4 MG tablet, 8 mg, Oral, Daily, 1 of 1 cycle, Start date: --, End date: -- palonosetron (ALOXI) injection 0.25 mg, 0.25 mg, Intravenous,  Once, 1 of 12 cycles Administration: 0.25 mg (05/04/2020) CARBOplatin (PARAPLATIN) 220 mg in sodium chloride 0.9 % 250 mL chemo infusion, 220 mg (100 % of original dose 222.4 mg), Intravenous,  Once, 1 of 12 cycles Dose modification:   (original dose 222.4 mg, Cycle 1) Administration: 220 mg (05/04/2020) PACLitaxel (TAXOL) 132 mg in sodium chloride 0.9 % 250 mL chemo infusion (</= 22m/m2), 80 mg/m2 = 132 mg, Intravenous,  Once, 1 of 12 cycles Administration: 132 mg (05/04/2020)  for chemotherapy treatment.    05/11/2020 - 07/31/2020 Chemotherapy         06/14/2020 Cancer Staging   Staging form: Breast, AJCC 8th Edition - Clinical: Stage IIIC (cT4, cN2b, cM0, G3, ER-, PR-, HER2-) - Signed by BCammie Sickle MD on 06/14/2020   08/20/2020 -  Chemotherapy    Patient is on Treatment Plan: BREAST PEMBROLIZUMAB + CARBOPLATIN D1 + ABRAXANEL D1,8,15 Q21D X 4 CYCLES         RISK FACTORS:  Menarche was at age 50  Ovaries intact: yes.  Hysterectomy: no.    Past Medical History:  Diagnosis Date  . Family history of breast cancer   . Family history of stomach cancer   . Family history  of stomach cancer   . GERD (gastroesophageal reflux disease)   . IUD (intrauterine device) in place IUD was placed in 2016  . Leiomyoma of uterus   . Schizophrenia ([place])   . Thyroid disease     Past Surgical History:  Procedure Laterality Date  . BREAST BIOPSY Left 03/2020   [facility]- Triple neg- Stage 3  . PORTACATH PLACEMENT Right 05/02/2020   Procedure: INSERTION PORT-A-CATH;   Surgeon: Herbert Pun, MD;  Location: [facility] ORS;  Service: General;  Laterality: Right;    Social History   Socioeconomic History  . Marital status: Single    Spouse name: Not on file  . Number of children: Not on file  . Years of education: Not on file  . Highest education level: Not on file  Occupational History  . Not on file  Tobacco Use  . Smoking status: Never Smoker  . Smokeless tobacco: Never Used  Substance and Sexual Activity  . Alcohol use: No  . Drug use: No  . Sexual activity: Yes    Birth control/protection: Surgical  Other Topics Concern  . Not on file  Social History Narrative  . Not on file   Social Determinants of Health   Financial Resource Strain: Not on file  Food Insecurity: Not on file  Transportation Needs: Not on file  Physical Activity: Not on file  Stress: Not on file  Social Connections: Not on file     FAMILY HISTORY:  We obtained a detailed, 4-generation family history.  Significant diagnoses are listed below: Family History  Problem Relation Age of Onset  . Breast cancer Mother   . Stomach cancer Maternal Grandfather   . Other Maternal Aunt        died from tumor, brain?   Tamara [name] does not have children. She has 2 sisters and 2 paternal half brothers. Her sisters are younger than her and have not had cancer.  Tamara [name]'s mother has also had breast cancer, she reports in her mid 68s, possibly had genetic testing, no report available. A maternal aunt died from a "tumor in her head." Maternal grandmother died over age 43. A maternal first cousin once removed on this side had stomach cancer. [name] maternal grandfather had stomach cancer and died at 24.  Tamara [name] does not have information about paternal relatives.   Tamara [name] is unaware of previous family history of genetic testing for hereditary cancer risks. [name] maternal ancestors are of unknown descent, and paternal ancestors are of unknown descent. There  is no reported Ashkenazi Jewish ancestry. There is no known consanguinity.    GENETIC COUNSELING ASSESSMENT: Tamara [name] is a 50 y.o. female with a personal and family history of breast cancer which is somewhat suggestive of a hereditary cancer syndrome and predisposition to cancer. We, therefore, discussed and recommended the following at today's visit.   DISCUSSION: We discussed that approximately 5-10% of breast cancer is hereditary  Most cases of hereditary breast cancer are associated with BRCA1/BRCA2 genes, although there are other genes associated with hereditary breast cancer as well .  We discussed that testing is beneficial for several reasons including surgical decision-making for breast cancer, knowing about other cancer risks, identifying potential screening and risk-reduction options that may be appropriate, and to understand if other family members could be at risk for cancer and allow them to undergo genetic testing.   We reviewed the characteristics, features and inheritance patterns of hereditary cancer syndromes. We also discussed genetic testing, including the appropriate  family members to test, the process of testing, insurance coverage and turn-around-time for results. We discussed the implications of a negative, positive and/or variant of uncertain significant result. We recommended Tamara [name] pursue genetic testing for the [facility] Multi-Cancer Panel+RNA.  The Multi-Cancer Panel + RNA offered by [facility] includes sequencing and/or deletion duplication testing of the following 84 genes: AIP, ALK, APC, ATM, AXIN2,BAP1,  BARD1, BLM, BMPR1A, BRCA1, BRCA2, BRIP1, CASR, CDC73, CDH1, CDK4, CDKN1B, CDKN1C, CDKN2A (p14ARF), CDKN2A (p16INK4a), CEBPA, CHEK2, CTNNA1, DICER1, DIS3L2, EGFR (c.2369C>T, p.Thr790Met variant only), EPCAM (Deletion/duplication testing only), FH, FLCN, GATA2, GPC3, GREM1 (Promoter region deletion/duplication testing only), HOXB13 (c.251G>A, p.Gly84Glu), HRAS, KIT,  MAX, MEN1, MET, MITF (c.952G>A, p.Glu318Lys variant only), MLH1, MSH2, MSH3, MSH6, MUTYH, NBN, NF1, NF2, NTHL1, PALB2, PDGFRA, PHOX2B, PMS2, POLD1, POLE, POT1, PRKAR1A, PTCH1, PTEN, RAD50, RAD51C, RAD51D, RB1, RECQL4, RET, RUNX1, SDHAF2, SDHA (sequence changes only), SDHB, SDHC, SDHD, SMAD4, SMARCA4, SMARCB1, SMARCE1, STK11, SUFU, TERC, TERT, TMEM127, TP53, TSC1, TSC2, VHL, WRN and WT1.  Based on Ms. Qadir's personal and family history of cancer, she meets medical criteria for genetic testing. Despite that she meets criteria, she may still have an out of pocket cost.   PLAN: After considering the risks, benefits, and limitations, Tamara [name] provided informed consent to pursue genetic testing and the blood sample was sent to [facility] for analysis of the Multi-Cancer Panel +RNA. Results should be available within approximately 2-3 weeks' time, at which point they will be disclosed by telephone to Tamara [name], as will any additional recommendations warranted by these results. Tamara [name] will receive a summary of her genetic counseling visit and a copy of her results once available. This information will also be available in Epic.   Tamara [name] questions were answered to her satisfaction today. Our contact information was provided should additional questions or concerns arise. Thank you for the referral and allowing [place] to share in the care of your patient.   Faith Rogue, MS, [facility] Genetic Counselor [email or web address] Phone: [phone]  The patient was seen for a total of 15 minutes in face-to-face genetic counseling. [name]/legal guardian Tamara [name] was also present.  Dr. Grayland Ormond was available for discussion regarding this case.   _______________________________________________________________________ For Office Staff:  Number of people involved in session: 2 Was an Intern/ student involved with case: no

## 2020-09-06 ENCOUNTER — Inpatient Hospital Stay: Payer: Medicaid Other | Admitting: Hospice and Palliative Medicine

## 2020-09-07 ENCOUNTER — Inpatient Hospital Stay: Payer: Medicaid Other

## 2020-09-07 VITALS — BP 130/73 | HR 90 | Temp 97.7°F

## 2020-09-07 DIAGNOSIS — C50812 Malignant neoplasm of overlapping sites of left female breast: Secondary | ICD-10-CM

## 2020-09-07 MED ORDER — HEPARIN SOD (PORK) LOCK FLUSH 100 UNIT/ML IV SOLN
500.0000 [IU] | Freq: Once | INTRAVENOUS | Status: AC | PRN
  Administered 2020-09-07: 500 [IU]
  Filled 2020-09-07: qty 5

## 2020-09-07 MED ORDER — POTASSIUM CHLORIDE 20 MEQ/100ML IV SOLN
20.0000 meq | Freq: Once | INTRAVENOUS | Status: AC
  Administered 2020-09-07: 20 meq via INTRAVENOUS

## 2020-09-07 MED ORDER — HEPARIN SOD (PORK) LOCK FLUSH 100 UNIT/ML IV SOLN
INTRAVENOUS | Status: AC
  Filled 2020-09-07: qty 5

## 2020-09-07 MED ORDER — SODIUM CHLORIDE 0.9 % IV SOLN
Freq: Once | INTRAVENOUS | Status: AC
  Filled 2020-09-07: qty 250

## 2020-09-11 ENCOUNTER — Inpatient Hospital Stay: Payer: Medicaid Other

## 2020-09-11 ENCOUNTER — Inpatient Hospital Stay (HOSPITAL_BASED_OUTPATIENT_CLINIC_OR_DEPARTMENT_OTHER): Payer: Medicaid Other | Admitting: Internal Medicine

## 2020-09-11 DIAGNOSIS — C50812 Malignant neoplasm of overlapping sites of left female breast: Secondary | ICD-10-CM | POA: Diagnosis not present

## 2020-09-11 DIAGNOSIS — Z171 Estrogen receptor negative status [ER-]: Secondary | ICD-10-CM

## 2020-09-11 DIAGNOSIS — Z7189 Other specified counseling: Secondary | ICD-10-CM

## 2020-09-11 LAB — CBC WITH DIFFERENTIAL/PLATELET
Abs Immature Granulocytes: 0.01 10*3/uL (ref 0.00–0.07)
Basophils Absolute: 0 10*3/uL (ref 0.0–0.1)
Basophils Relative: 1 %
Eosinophils Absolute: 0 10*3/uL (ref 0.0–0.5)
Eosinophils Relative: 1 %
HCT: 32.1 % — ABNORMAL LOW (ref 36.0–46.0)
Hemoglobin: 11.2 g/dL — ABNORMAL LOW (ref 12.0–15.0)
Immature Granulocytes: 0 %
Lymphocytes Relative: 28 %
Lymphs Abs: 1.2 10*3/uL (ref 0.7–4.0)
MCH: 32.7 pg (ref 26.0–34.0)
MCHC: 34.9 g/dL (ref 30.0–36.0)
MCV: 93.9 fL (ref 80.0–100.0)
Monocytes Absolute: 0.7 10*3/uL (ref 0.1–1.0)
Monocytes Relative: 16 %
Neutro Abs: 2.3 10*3/uL (ref 1.7–7.7)
Neutrophils Relative %: 54 %
Platelets: 228 10*3/uL (ref 150–400)
RBC: 3.42 MIL/uL — ABNORMAL LOW (ref 3.87–5.11)
RDW: 15.7 % — ABNORMAL HIGH (ref 11.5–15.5)
Smear Review: NORMAL
WBC: 4.2 10*3/uL (ref 4.0–10.5)
nRBC: 0 % (ref 0.0–0.2)

## 2020-09-11 LAB — COMPREHENSIVE METABOLIC PANEL
ALT: 11 U/L (ref 0–44)
AST: 26 U/L (ref 15–41)
Albumin: 3.8 g/dL (ref 3.5–5.0)
Alkaline Phosphatase: 57 U/L (ref 38–126)
Anion gap: 11 (ref 5–15)
BUN: <5 mg/dL — ABNORMAL LOW (ref 6–20)
CO2: 24 mmol/L (ref 22–32)
Calcium: 9.4 mg/dL (ref 8.9–10.3)
Chloride: 97 mmol/L — ABNORMAL LOW (ref 98–111)
Creatinine, Ser: 0.54 mg/dL (ref 0.44–1.00)
GFR, Estimated: >60 mL/min (ref >60)
Glucose, Bld: 156 mg/dL — ABNORMAL HIGH (ref 70–99)
Potassium: 3.2 mmol/L — ABNORMAL LOW (ref 3.5–5.1)
Sodium: 132 mmol/L — ABNORMAL LOW (ref 135–145)
Total Bilirubin: 0.3 mg/dL (ref 0.3–1.2)
Total Protein: 7.5 g/dL (ref 6.5–8.1)

## 2020-09-11 MED ORDER — SODIUM CHLORIDE 0.9 % IV SOLN
Freq: Once | INTRAVENOUS | Status: AC
  Filled 2020-09-11: qty 250

## 2020-09-11 MED ORDER — PROCHLORPERAZINE EDISYLATE 10 MG/2ML IJ SOLN
10.0000 mg | Freq: Once | INTRAMUSCULAR | Status: AC
  Administered 2020-09-11: 10 mg via INTRAVENOUS
  Filled 2020-09-11: qty 2

## 2020-09-11 MED ORDER — SODIUM CHLORIDE 0.9% FLUSH
10.0000 mL | Freq: Once | INTRAVENOUS | Status: AC
  Administered 2020-09-11: 10 mL via INTRAVENOUS
  Filled 2020-09-11: qty 10

## 2020-09-11 MED ORDER — POTASSIUM CHLORIDE 20 MEQ/100ML IV SOLN
20.0000 meq | Freq: Once | INTRAVENOUS | Status: AC
  Administered 2020-09-11: 20 meq via INTRAVENOUS

## 2020-09-11 MED ORDER — HEPARIN SOD (PORK) LOCK FLUSH 100 UNIT/ML IV SOLN
INTRAVENOUS | Status: AC
  Filled 2020-09-11: qty 5

## 2020-09-11 MED ORDER — HEPARIN SOD (PORK) LOCK FLUSH 100 UNIT/ML IV SOLN
500.0000 [IU] | Freq: Once | INTRAVENOUS | Status: AC | PRN
  Administered 2020-09-11: 500 [IU]
  Filled 2020-09-11: qty 5

## 2020-09-11 MED ORDER — PACLITAXEL PROTEIN-BOUND CHEMO INJECTION 100 MG
75.0000 mg/m2 | Freq: Once | INTRAVENOUS | Status: AC
  Administered 2020-09-11: 125 mg via INTRAVENOUS
  Filled 2020-09-11: qty 25

## 2020-09-11 NOTE — Progress Notes (Signed)
[facility] CONSULT NOTE  Patient Care Team: Physicians, [place] as PCP - General Vladimir Crofts, MD as Consulting Physician (Neurology) Cammie Sickle, MD as Consulting Physician (Internal Medicine) Randel Pigg, MD as Consulting Physician (Psychiatry)  CHIEF COMPLAINTS/PURPOSE OF CONSULTATION: Breast cancer  #  Oncology History Overview Note  # NOV 2021- LEFT BREAST CANCER - [place]; TRIPLE NEGATIVE; Grade of invasive carcinoma: Grade 3; # LYMPH NODE, LEFT AXILLA; [Dr.Cintron]; ULTRASOUND-GUIDED CORE BIOPSY:  - METASTATIC MAMMARY CARCINOMA, AT LEAST 9 MM METASTASIS. ]; DEC 9th PET scan-Intensely hypermetabolic 62.8 cm left breast mass compatible with primary left breast malignancy, with left pectoralis muscle involvement posteriorly and cutaneous involvement by Tamara mass. Multiple smaller hypermetabolic satellite malignant nodules scattered throughout Tamara left breast. Bulky hypermetabolic left axillary and left retropectoral nodal metastases.   # 12/10- Carboplatin ONLY;  Taxol hypersensitivity reaction-discontinued  # 12/17- AC #1 q 3 W x4; MARCH 28th- CARBO-ABRAXANE-KEYTRUDA; #cycle 1-8 [abraxane dose reduced to 75/m2- sec to diarrhea-3]  # SURVIVORSHIP:   # GENETICS: p  DIAGNOSIS: Triple negative breast cancer  STAGE: Stage III       ;  GOALS: Cure  CURRENT/MOST RECENT THERAPY : AC    Carcinoma of overlapping sites of left breast in female, estrogen receptor negative ([place])  04/24/2020 Initial Diagnosis   Carcinoma of overlapping sites of left breast in female, estrogen receptor negative ([place])   05/04/2020 - 05/04/2020 Chemotherapy   [facility] patient had dexamethasone (DECADRON) 4 MG tablet, 8 mg, Oral, Daily, 1 of 1 cycle, Start date: --, End date: -- palonosetron (ALOXI) injection 0.25 mg, 0.25 mg, Intravenous,  Once, 1 of 12 cycles Administration: 0.25 mg (05/04/2020) CARBOplatin (PARAPLATIN) 220 mg in sodium chloride 0.9 % 250 mL chemo infusion,  220 mg (100 % of original dose 222.4 mg), Intravenous,  Once, 1 of 12 cycles Dose modification:   (original dose 222.4 mg, Cycle 1) Administration: 220 mg (05/04/2020) PACLitaxel (TAXOL) 132 mg in sodium chloride 0.9 % 250 mL chemo infusion (</= 16m/m2), 80 mg/m2 = 132 mg, Intravenous,  Once, 1 of 12 cycles Administration: 132 mg (05/04/2020)  for chemotherapy treatment.    05/11/2020 - 07/31/2020 Chemotherapy         06/14/2020 Cancer Staging   Staging form: Breast, AJCC 8th Edition - Clinical: Stage IIIC (cT4, cN2b, cM0, G3, ER-, PR-, HER2-) - Signed by BCammie Sickle MD on 06/14/2020   08/20/2020 -  Chemotherapy    Patient is on Treatment Plan: BREAST PEMBROLIZUMAB + CARBOPLATIN D1 + ABRAXANEL D1,8,15 Q21D X 4 CYCLES        HISTORY OF PRESENTING ILLNESS: Patient is accompanied by her mother/patient is poor historian. [name]  female with schizophrenia and left locally advanced triple negative breast cancer T4 N2; currently on neoadjuvant chemotherapy with carboplatin and Abraxane Keytruda cycle number 1 day 1  is here for follow-up.  [name] day #8 cycle #1-Abraxane was held approximately 2 week ago because of ongoing diarrhea/electrolyte abnormalities/hyponatremia/hypokalemia.  In Tamara interim patient was been evaluated by surgery-recommend continuation of neoadjuvant therapy.  Patient is improved.  No nausea no vomiting.  Appetite is good.  Review of Systems  Unable to perform ROS: Psychiatric disorder     MEDICAL HISTORY:  Past Medical History:  Diagnosis Date  . Family history of breast cancer   . Family history of stomach cancer   . Family history of stomach cancer   . GERD (gastroesophageal reflux disease)   .  IUD (intrauterine device) in place IUD was placed in 2016  . Leiomyoma of uterus   . Schizophrenia ([place])   . Thyroid disease     SURGICAL HISTORY: Past Surgical History:  Procedure Laterality Date  . BREAST BIOPSY Left 03/2020    [facility]- Triple neg- Stage 3  . PORTACATH PLACEMENT Right 05/02/2020   Procedure: INSERTION PORT-A-CATH;  Surgeon: Herbert Pun, MD;  Location: [facility] ORS;  Service: General;  Laterality: Right;    SOCIAL HISTORY: Social History   Socioeconomic History  . Marital status: Single    Spouse name: Not on file  . Number of children: Not on file  . Years of education: Not on file  . Highest education level: Not on file  Occupational History  . Not on file  Tobacco Use  . Smoking status: Never Smoker  . Smokeless tobacco: Never Used  Substance and Sexual Activity  . Alcohol use: No  . Drug use: No  . Sexual activity: Yes    Birth control/protection: Surgical  Other Topics Concern  . Not on file  Social History Narrative  . Not on file   Social Determinants of Health   Financial Resource Strain: Not on file  Food Insecurity: Not on file  Transportation Needs: Not on file  Physical Activity: Not on file  Stress: Not on file  Social Connections: Not on file  Intimate Partner Violence: Not on file    FAMILY HISTORY: Family History  Problem Relation Age of Onset  . Breast cancer Mother   . Stomach cancer Maternal Grandfather   . Other Maternal Aunt        died from tumor, brain?    ALLERGIES:  is allergic to paclitaxel.  MEDICATIONS:  Current Outpatient Medications  Medication Sig Dispense Refill  . acetaminophen (TYLENOL) 650 MG suppository Place 1 suppository (650 mg total) rectally every 6 (six) hours as needed for fever. 12 suppository 0  . BANOPHEN 25 MG capsule Take 25 mg by mouth every 6 (six) hours as needed for itching. (Patient not taking: Reported on 09/04/2020)    . benztropine (COGENTIN) 0.5 MG tablet Take 1 tablet (0.5 mg total) by mouth 2 (two) times daily. (Patient not taking: Reported on 09/04/2020) 60 tablet 1  . cetirizine (ZYRTEC) 10 MG tablet Take 1 tablet (10 mg total) by mouth daily as needed for allergies. (Patient taking differently: Take 10 mg  by mouth daily.)    . clonazePAM (KLONOPIN) 0.5 MG tablet Take 0.5 mg by mouth at bedtime. (Patient not taking: Reported on 09/04/2020)    . diphenoxylate-atropine (LOMOTIL) 2.5-0.025 MG tablet Take 1 tablet by mouth 4 (four) times daily as needed for diarrhea or loose stools. Take it along with immodium (Patient not taking: Reported on 09/04/2020) 45 tablet 0  . divalproex (DEPAKOTE) 500 MG DR tablet Take 1 tablet (500 mg total) by mouth every 12 (twelve) hours. 60 tablet 1  . fluticasone (FLONASE) 50 MCG/ACT nasal spray Place 1 spray into both nostrils daily. 11.1 mL 9  . haloperidol (HALDOL) 5 MG tablet Take 1 tablet (5 mg total) by mouth 3 (three) times daily. 90 tablet 1  . haloperidol decanoate (HALDOL DECANOATE) 100 MG/ML injection Inject 100 mg into Tamara muscle every 28 (twenty-eight) days.    . hydrOXYzine (ATARAX/VISTARIL) 25 MG tablet Take 1 tablet (25 mg total) by mouth every 8 (eight) hours as needed. 60 tablet 1  . levothyroxine (SYNTHROID) 88 MCG tablet Take 1 tablet (88 mcg total) by mouth daily  at 6 (six) AM. 30 tablet 1  . lidocaine-prilocaine (EMLA) cream Apply 1 application topically as needed. 30 g 3  . medroxyPROGESTERone (DEPO-PROVERA) 150 MG/ML injection Inject into Tamara muscle.    Marland Kitchen MELATONIN PO Take 1 tablet by mouth at bedtime as needed (sleep).    . ondansetron (ZOFRAN) 8 MG tablet One pill every 8 hours as needed for nausea/vomitting. 40 tablet 1  . potassium chloride SA (KLOR-CON M20) 20 MEQ tablet Take 1 tablet (20 mEq total) by mouth 2 (two) times daily. 60 tablet 0  . prochlorperazine (COMPAZINE) 10 MG tablet Take 1 tablet (10 mg total) by mouth every 6 (six) hours as needed for nausea or vomiting. 40 tablet 1  . sodium chloride 1 g tablet Take 1 tablet (1 g total) by mouth 3 (three) times daily. 90 tablet 0   No current facility-administered medications for this visit.   Facility-Administered Medications Ordered in Other Visits  Medication Dose Route Frequency  Provider Last Rate Last Admin  . heparin lock flush 100 unit/mL  500 Units Intracatheter Once PRN Cammie Sickle, MD      . PACLitaxel-protein bound (ABRAXANE) chemo infusion 125 mg  75 mg/m2 (Treatment Plan Recorded) Intravenous Once Charlaine Dalton R, MD      . potassium chloride 20 mEq in 100 mL IVPB  20 mEq Intravenous Once Cammie Sickle, MD 100 mL/hr at 09/11/20 0952 20 mEq at 09/11/20 0952  . sodium chloride flush (NS) 0.9 % injection 10 mL  10 mL Intravenous PRN Cammie Sickle, MD   10 mL at 06/14/20 0840      .  PHYSICAL EXAMINATION: ECOG PERFORMANCE STATUS: 1 - Symptomatic but completely ambulatory  Vitals:   09/11/20 0842  BP: (!) 91/55  Pulse: 90  Resp: 17  Temp: (!) 97.4 F (36.3 C)  SpO2: 100%   Filed Weights   09/11/20 0842  Weight: 145 lb (65.8 kg)    Physical Exam Constitutional:      Comments: Accompanied by sister.  Ambulating independently.  HENT:     Head: Normocephalic and atraumatic.     Mouth/Throat:     Pharynx: No oropharyngeal exudate.  Eyes:     Pupils: Pupils are equal, round, and reactive to light.  Cardiovascular:     Rate and Rhythm: Normal rate and regular rhythm.  Pulmonary:     Effort: No respiratory distress.     Breath sounds: No wheezing.  Abdominal:     General: Bowel sounds are normal. There is no distension.     Palpations: Abdomen is soft. There is no mass.     Tenderness: There is no abdominal tenderness. There is no guarding or rebound.  Musculoskeletal:        General: No tenderness. Normal range of motion.     Cervical back: Normal range of motion and neck supple.  Skin:    General: Skin is warm.     Comments: Large left-sided breast ~ 5 x 4cm (overall improved).  However significant improvement of Tamara breast edema; improvement of Tamara lymphadenopathy  Neurological:     Mental Status: She is alert and oriented to person, place, and time.  Psychiatric:        Mood and Affect: Affect normal.      Comments: Poor insight/tangential thoughts.  Unable to make a coherent conversation-underlying schizophrenia.      LABORATORY DATA:  I have reviewed Tamara data as listed Lab Results  Component Value Date   WBC  4.2 09/11/2020   HGB 11.2 (L) 09/11/2020   HCT 32.1 (L) 09/11/2020   MCV 93.9 09/11/2020   PLT 228 09/11/2020   Recent Labs    08/20/20 0806 08/28/20 0823 09/04/20 0900 09/11/20 0817  NA 133* 130* 124* 132*  K 2.8* 2.5* 2.7* 3.2*  CL 94* 90* 89* 97*  CO2 27 28 25 24   GLUCOSE 118* 105* 97 156*  BUN <5* <5* <5* <5*  CREATININE 0.62 0.48 0.49 0.54  CALCIUM 9.5 9.4 9.0 9.4  GFRNONAA >60 >60 >60 >60  PROT 7.5 7.5  --  7.5  ALBUMIN 3.9 3.9  --  3.8  AST 19 23  --  26  ALT 13 15  --  11  ALKPHOS 57 56  --  57  BILITOT 0.4 0.3  --  0.3    RADIOGRAPHIC STUDIES: I have personally reviewed Tamara radiological images as listed and agreed with Tamara findings in Tamara report. US Breast Limited Uni Left Inc Axilla  Result Date: 08/17/2020 CLINICAL DATA:  50 year old female for follow-up of known LEFT breast cancer and lymph node metastases, currently on neoadjuvant therapy. EXAM: DIGITAL DIAGNOSTIC BILATERAL MAMMOGRAM WITH TOMOSYNTHESIS AND CAD; ULTRASOUND LEFT BREAST LIMITED TECHNIQUE: Bilateral digital diagnostic mammography and breast tomosynthesis was performed. Tamara images were evaluated with computer-aided detection.; Targeted ultrasound examination of Tamara left breast was performed COMPARISON:  04/03/2020 ultrasound. No prior mammograms are available for comparison. ACR Breast Density Category b: There are scattered areas of fibroglandular density. FINDINGS: 2D/3D full field views of both breasts demonstrate a large obscured mass within Tamara UPPER OUTER LEFT breast, containing a biopsy clip. Trabecular thickening within Tamara central and UPPER OUTER LEFT breast noted. No suspicious mammographic findings within Tamara RIGHT breast are noted. Targeted ultrasound is performed, showing a 4.4 x 4.2  x 4.5 cm mass/malignancy at Tamara 1 o'clock position of Tamara LEFT breast 4 cm from Tamara nipple, previously 6.3 x 6.2 x 7.1 cm on 04/03/2020. Enlarged LEFT axillary lymph nodes have decreased in size, Tamara largest now with 0.8 cm of cortical thickness, previously 2.6 cm. IMPRESSION: 1. Decreased size of known malignancy within Tamara UPPER-OUTER LEFT breast, now measuring 4.5 cm in greatest diameter, previously 7.1 cm. 2. Decreased size of LEFT axillary lymph nodes, now with largest measuring 0.8 cm of cortical thickness, previously 2.6 cm. 3. No mammographic evidence of RIGHT breast malignancy. RECOMMENDATION: Treatment plan I have discussed Tamara findings and recommendations with Tamara patient. If applicable, a reminder letter will be sent to Tamara patient regarding Tamara next appointment. BI-RADS CATEGORY  6: Known biopsy-proven malignancy. Electronically Signed   By: Margarette Canada M.D.   On: 08/17/2020 10:49   MM DIAG BREAST TOMO BILATERAL  Result Date: 08/17/2020 CLINICAL DATA:  50 year old female for follow-up of known LEFT breast cancer and lymph node metastases, currently on neoadjuvant therapy. EXAM: DIGITAL DIAGNOSTIC BILATERAL MAMMOGRAM WITH TOMOSYNTHESIS AND CAD; ULTRASOUND LEFT BREAST LIMITED TECHNIQUE: Bilateral digital diagnostic mammography and breast tomosynthesis was performed. Tamara images were evaluated with computer-aided detection.; Targeted ultrasound examination of Tamara left breast was performed COMPARISON:  04/03/2020 ultrasound. No prior mammograms are available for comparison. ACR Breast Density Category b: There are scattered areas of fibroglandular density. FINDINGS: 2D/3D full field views of both breasts demonstrate a large obscured mass within Tamara UPPER OUTER LEFT breast, containing a biopsy clip. Trabecular thickening within Tamara central and UPPER OUTER LEFT breast noted. No suspicious mammographic findings within Tamara RIGHT breast are noted. Targeted ultrasound is performed, showing a  4.4 x 4.2 x 4.5 cm  mass/malignancy at Tamara 1 o'clock position of Tamara LEFT breast 4 cm from Tamara nipple, previously 6.3 x 6.2 x 7.1 cm on 04/03/2020. Enlarged LEFT axillary lymph nodes have decreased in size, Tamara largest now with 0.8 cm of cortical thickness, previously 2.6 cm. IMPRESSION: 1. Decreased size of known malignancy within Tamara UPPER-OUTER LEFT breast, now measuring 4.5 cm in greatest diameter, previously 7.1 cm. 2. Decreased size of LEFT axillary lymph nodes, now with largest measuring 0.8 cm of cortical thickness, previously 2.6 cm. 3. No mammographic evidence of RIGHT breast malignancy. RECOMMENDATION: Treatment plan I have discussed Tamara findings and recommendations with Tamara patient. If applicable, a reminder letter will be sent to Tamara patient regarding Tamara next appointment. BI-RADS CATEGORY  6: Known biopsy-proven malignancy. Electronically Signed   By: Margarette Canada M.D.   On: 08/17/2020 10:49      06/04/2020-     07/30/2020-     ASSESSMENT & PLAN:   Carcinoma of overlapping sites of left breast in female, estrogen receptor negative ([place]) #Left breast cancer T4 N2 M0-stage III triple negative; s/p Adriamycin Cytoxan every 3 weeks x4; US/mamo- March 25th- partial response noted [currently 4cm; improved LN]; currently- s/p carboplatin-Keytruda-Abraxane cycle #1 d-1.   # proceed with cycle 1- day-8. Labs today reviewed;  acceptable for treatment today; will decrease Tamara dose of abraxane [47m/m2]  # GI-Diarrhea/nausea/vomitting-likely secondary to Abraxane [reduce Tamara dose]. Better on imodium/lomotil prn.  Monitor closely  #Electrolytes abnormalities: chronic hyponatremia-likely secondary to [facility]/psychiatric medication; sodium today 132; Potassium 3.2; recommend continue 20 mg of potassium at home; add 20 of KCl today.  Refilled  # schizophrenia: STABLE; [facility] [phone] Dr.Ahluwalia].   # Genetics: s/p genetic testing/counseling; awaiting results now.   # DISPOSITION: #  chemo today; Kcl 20  meq;  # on 4/22VF over 1 hour; Kcl 20 meq;  # as planned 1 weeks MD; labs- cbc/bmp; abraxane; possible 20 KCL # 2  weeks MD; labs- cbc/bmp; carbo-abraxane-keytruda; possible 20 KCL- Dr.B    All questions were answered. Tamara patient knows to call [facility] clinic with any problems, questions or concerns.    GCammie Sickle MD 09/11/2020 10:06 AM

## 2020-09-11 NOTE — Assessment & Plan Note (Addendum)
#  Left breast cancer T4 N2 M0-stage III triple negative; s/p Adriamycin Cytoxan every 3 weeks x4; US/mamo- March 25th- partial response noted [currently 4cm; improved LN]; currently- s/p carboplatin-Keytruda-Abraxane cycle #1 d-1.   # proceed with cycle 1- day-8. Labs today reviewed;  acceptable for treatment today; will decrease the dose of abraxane [75mg /m2]  # GI-Diarrhea/nausea/vomitting-likely secondary to Abraxane [reduce the dose]. Better on imodium/lomotil prn.  Monitor closely  #Electrolytes abnormalities: chronic hyponatremia-likely secondary to [facility]/psychiatric medication; sodium today 132; Potassium 3.2; recommend continue 20 mg of potassium at home; add 20 of KCl today.  Refilled  # schizophrenia: STABLE; [facility] [phone]; Dr.Ahluwalia].   # Genetics: s/p genetic testing/counseling; awaiting results now.   # DISPOSITION: #  chemo today; Kcl 20 meq;  # on 4/22VF over 1 hour; Kcl 20 meq;  # as planned 1 weeks MD; labs- cbc/bmp; abraxane; possible 20 KCL # 2  weeks MD; labs- cbc/bmp; carbo-abraxane-keytruda; possible 20 KCL- Dr.B

## 2020-09-14 ENCOUNTER — Inpatient Hospital Stay: Payer: Medicaid Other

## 2020-09-14 VITALS — BP 113/77 | HR 100 | Temp 96.8°F | Resp 18

## 2020-09-14 DIAGNOSIS — C50812 Malignant neoplasm of overlapping sites of left female breast: Secondary | ICD-10-CM

## 2020-09-14 DIAGNOSIS — Z171 Estrogen receptor negative status [ER-]: Secondary | ICD-10-CM

## 2020-09-14 MED ORDER — SODIUM CHLORIDE 0.9% FLUSH
10.0000 mL | Freq: Once | INTRAVENOUS | Status: AC | PRN
  Administered 2020-09-14: 10 mL
  Filled 2020-09-14: qty 10

## 2020-09-14 MED ORDER — POTASSIUM CHLORIDE 20 MEQ/100ML IV SOLN: 20.0000 meq | Freq: Once | INTRAVENOUS | Status: DC

## 2020-09-14 MED ORDER — POTASSIUM CHLORIDE IN NACL 20-0.9 MEQ/L-% IV SOLN
Freq: Once | INTRAVENOUS | Status: AC
  Filled 2020-09-14: qty 1000

## 2020-09-14 MED ORDER — HEPARIN SOD (PORK) LOCK FLUSH 100 UNIT/ML IV SOLN
500.0000 [IU] | Freq: Once | INTRAVENOUS | Status: AC | PRN
  Administered 2020-09-14: 500 [IU]
  Filled 2020-09-14: qty 5

## 2020-09-14 NOTE — Progress Notes (Signed)
Pt received 1L of IVF with 30meq K+ in clinic today. No complaints at d/c.

## 2020-09-18 ENCOUNTER — Inpatient Hospital Stay: Payer: Medicaid Other

## 2020-09-18 ENCOUNTER — Inpatient Hospital Stay (HOSPITAL_BASED_OUTPATIENT_CLINIC_OR_DEPARTMENT_OTHER): Payer: Medicaid Other | Admitting: Internal Medicine

## 2020-09-18 ENCOUNTER — Inpatient Hospital Stay (HOSPITAL_BASED_OUTPATIENT_CLINIC_OR_DEPARTMENT_OTHER): Payer: Medicaid Other | Admitting: Hospice and Palliative Medicine

## 2020-09-18 ENCOUNTER — Encounter: Payer: Self-pay | Admitting: Internal Medicine

## 2020-09-18 DIAGNOSIS — C50812 Malignant neoplasm of overlapping sites of left female breast: Secondary | ICD-10-CM

## 2020-09-18 DIAGNOSIS — Z171 Estrogen receptor negative status [ER-]: Secondary | ICD-10-CM

## 2020-09-18 DIAGNOSIS — Z515 Encounter for palliative care: Secondary | ICD-10-CM

## 2020-09-18 DIAGNOSIS — Z7189 Other specified counseling: Secondary | ICD-10-CM

## 2020-09-18 LAB — BASIC METABOLIC PANEL
Anion gap: 12 (ref 5–15)
BUN: <5 mg/dL — ABNORMAL LOW (ref 6–20)
CO2: 23 mmol/L (ref 22–32)
Calcium: 9.4 mg/dL (ref 8.9–10.3)
Chloride: 96 mmol/L — ABNORMAL LOW (ref 98–111)
Creatinine, Ser: 0.51 mg/dL (ref 0.44–1.00)
GFR, Estimated: >60 mL/min (ref >60)
Glucose, Bld: 91 mg/dL (ref 70–99)
Potassium: 3.6 mmol/L (ref 3.5–5.1)
Sodium: 131 mmol/L — ABNORMAL LOW (ref 135–145)

## 2020-09-18 LAB — CBC WITH DIFFERENTIAL/PLATELET
Abs Immature Granulocytes: 0.05 10*3/uL (ref 0.00–0.07)
Basophils Absolute: 0 10*3/uL (ref 0.0–0.1)
Basophils Relative: 1 %
Eosinophils Absolute: 0 10*3/uL (ref 0.0–0.5)
Eosinophils Relative: 1 %
HCT: 29 % — ABNORMAL LOW (ref 36.0–46.0)
Hemoglobin: 10.3 g/dL — ABNORMAL LOW (ref 12.0–15.0)
Immature Granulocytes: 1 %
Lymphocytes Relative: 24 %
Lymphs Abs: 1 10*3/uL (ref 0.7–4.0)
MCH: 33 pg (ref 26.0–34.0)
MCHC: 35.5 g/dL (ref 30.0–36.0)
MCV: 92.9 fL (ref 80.0–100.0)
Monocytes Absolute: 0.4 10*3/uL (ref 0.1–1.0)
Monocytes Relative: 8 %
Neutro Abs: 2.7 10*3/uL (ref 1.7–7.7)
Neutrophils Relative %: 65 %
Platelets: 172 10*3/uL (ref 150–400)
RBC: 3.12 MIL/uL — ABNORMAL LOW (ref 3.87–5.11)
RDW: 14.9 % (ref 11.5–15.5)
WBC: 4.2 10*3/uL (ref 4.0–10.5)
nRBC: 0 % (ref 0.0–0.2)

## 2020-09-18 MED ORDER — OLOPATADINE HCL 0.1 % OP SOLN: 1.0000 [drp] | Freq: Two times a day (BID) | OPHTHALMIC | 1 refills | Status: DC

## 2020-09-18 MED ORDER — SODIUM CHLORIDE 0.9% FLUSH
10.0000 mL | Freq: Once | INTRAVENOUS | Status: AC
  Administered 2020-09-18: 10 mL via INTRAVENOUS
  Filled 2020-09-18: qty 10

## 2020-09-18 MED ORDER — SODIUM CHLORIDE 0.9 % IV SOLN
INTRAVENOUS | Status: DC
  Filled 2020-09-18: qty 250

## 2020-09-18 MED ORDER — PROCHLORPERAZINE EDISYLATE 10 MG/2ML IJ SOLN
10.0000 mg | Freq: Once | INTRAMUSCULAR | Status: AC
  Administered 2020-09-18: 10 mg via INTRAVENOUS
  Filled 2020-09-18: qty 2

## 2020-09-18 MED ORDER — PACLITAXEL PROTEIN-BOUND CHEMO INJECTION 100 MG
75.0000 mg/m2 | Freq: Once | INTRAVENOUS | Status: AC
  Administered 2020-09-18: 125 mg via INTRAVENOUS
  Filled 2020-09-18: qty 25

## 2020-09-18 MED ORDER — HEPARIN SOD (PORK) LOCK FLUSH 100 UNIT/ML IV SOLN
INTRAVENOUS | Status: AC
  Filled 2020-09-18: qty 5

## 2020-09-18 NOTE — Assessment & Plan Note (Addendum)
#  Left breast cancer T4 N2 M0-stage III triple negative; s/p Adriamycin Cytoxan every 3 weeks x4; US/mamo- March 25th- partial response noted [currently 4cm; improved LN]; currently- s/p carboplatin-Keytruda-Abraxane cycle #1 d-8   # proceed with cycle 1- day-15 Labs today reviewed;  acceptable for treatment today; will decrease the dose of abraxane [75mg /m2].   # GI-Diarrhea/nausea/vomitting-likely secondary to Abraxane [reduce the dose]. Better on imodium/lomotil prn.  STABLE.   #Electrolytes abnormalities: chronic hyponatremia-likely secondary to [facility]/psychiatric medication; sodium today 132; Potassium 3.6; continue KCL at home. STABLE.  # schizophrenia: STABLE; Trit[facility] [phone]; Dr.Ahluwalia].   # Genetics: s/p genetic testing/counseling; awaiting results now.   # DISPOSITION: #  chemo today; HOLD Kcl 20 meq;   # as planned in 1  weeks MD; labs- cbc/bmp; carbo-abraxane-keytruda; possible 20 KCL  # in 2 week MD; labs- cbc/bmp; abraxane; possible 20 KCL- Dr.B

## 2020-09-18 NOTE — Patient Instructions (Signed)
[facility]COL[facility]  Discharge Instructions: Thank you for choosing [facility] to provide your oncology and hematology care.  If you have a lab appointment with the [facility], please go directly to the [facility] and check in at the registration area.  Wear comfortable clothing and clothing appropriate for easy access to any Portacath or PICC line.   We strive to give you quality time with your provider. You may need to reschedule your appointment if you arrive late (15 or more minutes).  Arriving late affects you and other patients whose appointments are after yours.  Also, if you miss three or more appointments without notifying the office, you may be dismissed from the clinic at the provider's discretion.      For prescription refill requests, have your pharmacy contact our office and allow 72 hours for refills to be completed.    Today you received the following chemotherapy and/or immunotherapy agents Abraxane      To help prevent nausea and vomiting after your treatment, we encourage you to take your nausea medication as directed.  BELOW ARE SYMPTOMS THAT SHOULD BE REPORTED IMMEDIATELY: . *FEVER GREATER THAN 100.4 F (38 C) OR HIGHER . *CHILLS OR SWEATING . *NAUSEA AND VOMITING THAT IS NOT CONTROLLED WITH YOUR NAUSEA MEDICATION . *UNUSUAL SHORTNESS OF BREATH . *UNUSUAL BRUISING OR BLEEDING . *URINARY PROBLEMS (pain or burning when urinating, or frequent urination) . *BOWEL PROBLEMS (unusual diarrhea, constipation, pain near the anus) . TENDERNESS IN MOUTH AND THROAT WITH OR WITHOUT PRESENCE OF ULCERS (sore throat, sores in mouth, or a toothache) . UNUSUAL RASH, SWELLING OR PAIN  . UNUSUAL VAGINAL DISCHARGE OR ITCHING   Items with * indicate a potential emergency and should be followed up as soon as possible or go to the Emergency Department if any problems should occur.  Please show the CHEMOTHERAPY ALERT CARD or IMMUNOTHERAPY ALERT  CARD at check-in to the Emergency Department and triage nurse.  Should you have questions after your visit or need to cancel or reschedule your appointment, please contact [facility]  [phone] and follow the prompts.  Office hours are 8:00 a.m. to 4:30 p.m. Monday - Friday. Please note that voicemails left after 4:00 p.m. may not be returned until the following business day.  We are closed weekends and major holidays. You have access to a nurse at all times for urgent questions. Please call the main number to the clinic [phone] and follow the prompts.  For any non-urgent questions, you may also contact your provider using MyChart. We now offer e-Visits for anyone 23 and older to request care online for non-urgent symptoms. For details visit [email or web address].   Also download the MyChart app! Go to the app store, search "MyChart", open the app, select , and log in with your MyChart username and password.  Due to Covid, a mask is required upon entering the hospital/clinic. If you do not have a mask, one will be given to you upon arrival. For doctor visits, patients may have 1 support person aged 38 or older with them. For treatment visits, patients cannot have anyone with them due to current Covid guidelines and our immunocompromised population.

## 2020-09-18 NOTE — Progress Notes (Signed)
[place] CONSULT NOTE  Patient Care Team: Physicians, [place] as PCP - General Vladimir Crofts, MD as Consulting Physician (Neurology) Cammie Sickle, MD as Consulting Physician (Internal Medicine) Randel Pigg, MD as Consulting Physician (Psychiatry)  CHIEF COMPLAINTS/PURPOSE OF CONSULTATION: Breast cancer  #  Oncology History Overview Note  # NOV 2021- LEFT BREAST CANCER - [place]; TRIPLE NEGATIVE; Grade of invasive carcinoma: Grade 3; # LYMPH NODE, LEFT AXILLA; [Dr.Cintron]; ULTRASOUND-GUIDED CORE BIOPSY:  - METASTATIC MAMMARY CARCINOMA, AT LEAST 9 MM METASTASIS. ]; DEC 9th PET scan-Intensely hypermetabolic 76.7 cm left breast mass compatible with primary left breast malignancy, with left pectoralis muscle involvement posteriorly and cutaneous involvement by the mass. Multiple smaller hypermetabolic satellite malignant nodules scattered throughout the left breast. Bulky hypermetabolic left axillary and left retropectoral nodal metastases.   # 12/10- Carboplatin ONLY;  Taxol hypersensitivity reaction-discontinued  # 12/17- AC #1 q 3 W x4; MARCH 28th- CARBO-ABRAXANE-KEYTRUDA; #cycle 1-8 [abraxane dose reduced to 75/m2- sec to diarrhea-3]  # SURVIVORSHIP:   # GENETICS: p  DIAGNOSIS: Triple negative breast cancer  STAGE: Stage III       ;  GOALS: Cure  CURRENT/MOST RECENT THERAPY : AC    Carcinoma of overlapping sites of left breast in female, estrogen receptor negative ([place])  04/24/2020 Initial Diagnosis   Carcinoma of overlapping sites of left breast in female, estrogen receptor negative ([place])   05/04/2020 - 05/04/2020 Chemotherapy   The patient had dexamethasone (DECADRON) 4 MG tablet, 8 mg, Oral, Daily, 1 of 1 cycle, Start date: --, End date: -- palonosetron (ALOXI) injection 0.25 mg, 0.25 mg, Intravenous,  Once, 1 of 12 cycles Administration: 0.25 mg (05/04/2020) CARBOplatin (PARAPLATIN) 220 mg in sodium chloride 0.9 % 250 mL chemo infusion,  220 mg (100 % of original dose 222.4 mg), Intravenous,  Once, 1 of 12 cycles Dose modification:   (original dose 222.4 mg, Cycle 1) Administration: 220 mg (05/04/2020) PACLitaxel (TAXOL) 132 mg in sodium chloride 0.9 % 250 mL chemo infusion (</= 57m/m2), 80 mg/m2 = 132 mg, Intravenous,  Once, 1 of 12 cycles Administration: 132 mg (05/04/2020)  for chemotherapy treatment.    05/11/2020 - 07/31/2020 Chemotherapy         06/14/2020 Cancer Staging   Staging form: Breast, AJCC 8th Edition - Clinical: Stage IIIC (cT4, cN2b, cM0, G3, ER-, PR-, HER2-) - Signed by BCammie Sickle MD on 06/14/2020   08/20/2020 -  Chemotherapy    Patient is on Treatment Plan: BREAST PEMBROLIZUMAB + CARBOPLATIN D1 + ABRAXANEL D1,8,15 Q21D X 4 CYCLES        HISTORY OF PRESENTING ILLNESS: Patient is accompanied by her mother/patient is poor historian. Tamara Paling89y.o.  female with schizophrenia and left locally advanced triple negative breast cancer T4 N2; currently on neoadjuvant chemotherapy with carboplatin and Abraxane Keytruda.  Patient is currently status post cycle number 1 day 8 Abraxane 1 week ago.  Chemotherapy interrupted because of diarrhea electrolyte abnormalities.  Eye red/itch  complains of "breast turning black"  Patient is improved.  No nausea no vomiting.  Appetite is good.  Review of Systems  Unable to perform ROS: Psychiatric disorder     MEDICAL HISTORY:  Past Medical History:  Diagnosis Date  . Family history of breast cancer   . Family history of stomach cancer   . Family history of stomach cancer   . GERD (gastroesophageal reflux disease)   . IUD (intrauterine device) in place IUD was placed in  2016  . Leiomyoma of uterus   . Schizophrenia ([place])   . Thyroid disease     SURGICAL HISTORY: Past Surgical History:  Procedure Laterality Date  . BREAST BIOPSY Left 03/2020   [facility]- Triple neg- Stage 3  . PORTACATH PLACEMENT Right 05/02/2020   Procedure: INSERTION  PORT-A-CATH;  Surgeon: Herbert Pun, MD;  Location: [facility] ORS;  Service: General;  Laterality: Right;    SOCIAL HISTORY: Social History   Socioeconomic History  . Marital status: Single    Spouse name: Not on file  . Number of children: Not on file  . Years of education: Not on file  . Highest education level: Not on file  Occupational History  . Not on file  Tobacco Use  . Smoking status: Never Smoker  . Smokeless tobacco: Never Used  Substance and Sexual Activity  . Alcohol use: No  . Drug use: No  . Sexual activity: Yes    Birth control/protection: Surgical  Other Topics Concern  . Not on file  Social History Narrative  . Not on file   Social Determinants of Health   Financial Resource Strain: Not on file  Food Insecurity: Not on file  Transportation Needs: Not on file  Physical Activity: Not on file  Stress: Not on file  Social Connections: Not on file  Intimate Partner Violence: Not on file    FAMILY HISTORY: Family History  Problem Relation Age of Onset  . Breast cancer Mother   . Stomach cancer Maternal Grandfather   . Other Maternal Aunt        died from tumor, brain?    ALLERGIES:  is allergic to paclitaxel.  MEDICATIONS:  Current Outpatient Medications  Medication Sig Dispense Refill  . acetaminophen (TYLENOL) 650 MG suppository Place 1 suppository (650 mg total) rectally every 6 (six) hours as needed for fever. 12 suppository 0  . cetirizine (ZYRTEC) 10 MG tablet Take 1 tablet (10 mg total) by mouth daily as needed for allergies.    Marland Kitchen divalproex (DEPAKOTE) 500 MG DR tablet Take 1 tablet (500 mg total) by mouth every 12 (twelve) hours. 60 tablet 1  . fluticasone (FLONASE) 50 MCG/ACT nasal spray Place 1 spray into both nostrils daily. 11.1 mL 9  . haloperidol (HALDOL) 5 MG tablet Take 1 tablet (5 mg total) by mouth 3 (three) times daily. 90 tablet 1  . haloperidol decanoate (HALDOL DECANOATE) 100 MG/ML injection Inject 100 mg into the muscle  every 28 (twenty-eight) days.    . hydrOXYzine (ATARAX/VISTARIL) 25 MG tablet Take 1 tablet (25 mg total) by mouth every 8 (eight) hours as needed. 60 tablet 1  . levothyroxine (SYNTHROID) 88 MCG tablet Take 1 tablet (88 mcg total) by mouth daily at 6 (six) AM. 30 tablet 1  . lidocaine-prilocaine (EMLA) cream Apply 1 application topically as needed. 30 g 3  . medroxyPROGESTERone (DEPO-PROVERA) 150 MG/ML injection Inject into the muscle.    Marland Kitchen MELATONIN PO Take 1 tablet by mouth at bedtime as needed (sleep).    Marland Kitchen olopatadine (PATANOL) 0.1 % ophthalmic solution Place 1 drop into both eyes 2 (two) times daily. 10 mL 1  . ondansetron (ZOFRAN) 8 MG tablet One pill every 8 hours as needed for nausea/vomitting. 40 tablet 1  . potassium chloride SA (KLOR-CON M20) 20 MEQ tablet Take 1 tablet (20 mEq total) by mouth 2 (two) times daily. 60 tablet 0  . prochlorperazine (COMPAZINE) 10 MG tablet Take 1 tablet (10 mg total) by mouth every 6 (  six) hours as needed for nausea or vomiting. 40 tablet 1  . sodium chloride 1 g tablet Take 1 tablet (1 g total) by mouth 3 (three) times daily. 90 tablet 0  . BANOPHEN 25 MG capsule Take 25 mg by mouth every 6 (six) hours as needed for itching. (Patient not taking: No sig reported)    . benztropine (COGENTIN) 0.5 MG tablet Take 1 tablet (0.5 mg total) by mouth 2 (two) times daily. (Patient not taking: No sig reported) 60 tablet 1  . clonazePAM (KLONOPIN) 0.5 MG tablet Take 0.5 mg by mouth at bedtime. (Patient not taking: No sig reported)    . diphenoxylate-atropine (LOMOTIL) 2.5-0.025 MG tablet Take 1 tablet by mouth 4 (four) times daily as needed for diarrhea or loose stools. Take it along with immodium (Patient not taking: No sig reported) 45 tablet 0   No current facility-administered medications for this visit.   Facility-Administered Medications Ordered in Other Visits  Medication Dose Route Frequency Provider Last Rate Last Admin  . sodium chloride flush (NS) 0.9 %  injection 10 mL  10 mL Intravenous PRN Cammie Sickle, MD   10 mL at 06/14/20 0840      .  PHYSICAL EXAMINATION: ECOG PERFORMANCE STATUS: 1 - Symptomatic but completely ambulatory  Vitals:   09/18/20 0908  BP: 121/70  Pulse: 94  Resp: 16  Temp: (!) 96.7 F (35.9 C)  SpO2: 100%   Filed Weights   09/18/20 0908  Weight: 145 lb (65.8 kg)    Physical Exam Constitutional:      Comments: Accompanied by sister.  Ambulating independently.  HENT:     Head: Normocephalic and atraumatic.     Mouth/Throat:     Pharynx: No oropharyngeal exudate.  Eyes:     Pupils: Pupils are equal, round, and reactive to light.  Cardiovascular:     Rate and Rhythm: Normal rate and regular rhythm.  Pulmonary:     Effort: No respiratory distress.     Breath sounds: No wheezing.  Abdominal:     General: Bowel sounds are normal. There is no distension.     Palpations: Abdomen is soft. There is no mass.     Tenderness: There is no abdominal tenderness. There is no guarding or rebound.  Musculoskeletal:        General: No tenderness. Normal range of motion.     Cervical back: Normal range of motion and neck supple.  Skin:    General: Skin is warm.     Comments: Large left-sided breast ~ 5 x 4cm (overall improved).  However significant improvement of the breast edema; improvement of the lymphadenopathy  Neurological:     Mental Status: She is alert and oriented to person, place, and time.  Psychiatric:        Mood and Affect: Affect normal.     Comments: Poor insight/tangential thoughts.  Unable to make a coherent conversation-underlying schizophrenia.      LABORATORY DATA:  I have reviewed the data as listed Lab Results  Component Value Date   WBC 4.2 09/18/2020   HGB 10.3 (L) 09/18/2020   HCT 29.0 (L) 09/18/2020   MCV 92.9 09/18/2020   PLT 172 09/18/2020   Recent Labs    08/20/20 0806 08/28/20 0823 09/04/20 0900 09/11/20 0817 09/18/20 0827  NA 133* 130* 124* 132* 131*  K  2.8* 2.5* 2.7* 3.2* 3.6  CL 94* 90* 89* 97* 96*  CO2 _0 GLUCOSE 118* 105*  97 156* 91  BUN <5* <5* <5* <5* <5*  CREATININE 0.62 0.48 0.49 0.54 0.51  CALCIUM 9.5 9.4 9.0 9.4 9.4  GFRNONAA >60 >60 >60 >60 >60  PROT 7.5 7.5  --  7.5  --   ALBUMIN 3.9 3.9  --  3.8  --   AST 19 23  --  26  --   ALT 13 15  --  11  --   ALKPHOS 57 56  --  57  --   BILITOT 0.4 0.3  --  0.3  --     RADIOGRAPHIC STUDIES: I have personally reviewed the radiological images as listed and agreed with the findings in the report. No results found.    06/04/2020-     07/30/2020-    On 4/26-      ASSESSMENT & PLAN:   Carcinoma of overlapping sites of left breast in female, estrogen receptor negative ([place]) #Left breast cancer T4 N2 M0-stage III triple negative; s/p Adriamycin Cytoxan every 3 weeks x4; US/mamo- March 25th- partial response noted [currently 4cm; improved LN]; currently- s/p carboplatin-Keytruda-Abraxane cycle #1 d-8   # proceed with cycle 1- day-15 Labs today reviewed;  acceptable for treatment today; will decrease the dose of abraxane [1m/m2].   # GI-Diarrhea/nausea/vomitting-likely secondary to Abraxane [reduce the dose]. Better on imodium/lomotil prn.  STABLE.   #Electrolytes abnormalities: chronic hyponatremia-likely secondary to SIDH/psychiatric medication; sodium today 132; Potassium 3.6; continue KCL at home. STABLE.  # schizophrenia: STABLE; Tritnity [[phone] Dr.Ahluwalia].   # Genetics: s/p genetic testing/counseling; awaiting results now.   # DISPOSITION: #  chemo today; HOLD Kcl 20 meq;   # as planned in 1  weeks MD; labs- cbc/bmp; carbo-abraxane-keytruda; possible 20 KCL  # in 2 week MD; labs- cbc/bmp; abraxane; possible 20 KCL- Dr.B    All questions were answered. The patient knows to call the clinic with any problems, questions or concerns.    GCammie Sickle MD 09/18/2020 9:25 PM

## 2020-09-18 NOTE — Progress Notes (Signed)
[place]  Telephone:([phone] [phone]   Name: Tamara [name] Date: 09/18/2020 MRN: [id number]  DOB: August 23, 1970  Patient Care Team: Physicians, [place] as PCP - General Vladimir Crofts, MD as Consulting Physician (Neurology) Cammie Sickle, MD as Consulting Physician (Internal Medicine) Randel Pigg, MD as Consulting Physician (Psychiatry)    REASON FOR CONSULTATION: Tamara [name] is a 50 y.o. female with multiple medical problems including including schizophrenia, intellectual disability, who was hospitalized 04/03/2020-11/60/21 for worsening agitation and was IVC due to acute psychosis.  Patient was found to have a large left breast mass with biopsy on 11/12 confirming invasive breast cancer.   CT of the chest/abdomen/pelvis on 11/10 showed the breast mass extending into pectoralis major muscle and possibly invading the chest wall. She also had several lymph nodes concerning for metastatic adenopathy. Treatment options were felt to be limited in part due to her schizophrenia, agitation, and poor insight. Palliative care was consulted to address goals..   SOCIAL HISTORY:     reports that she has never smoked. She has never used smokeless tobacco. She reports that she does not drink alcohol and does not use drugs.  Patient is unmarried.  She has no children.  She lives at home with her mother and father. She has two sisters who are involved in her care.   ADVANCE DIRECTIVES:  Reportedly patient's mother is her legal guardian  CODE STATUS:   PAST MEDICAL HISTORY: Past Medical History:  Diagnosis Date  . Family history of breast cancer   . Family history of stomach cancer   . Family history of stomach cancer   . GERD (gastroesophageal reflux disease)   . IUD (intrauterine device) in place IUD was placed in 2016  . Leiomyoma of uterus   . Schizophrenia ([place])   . Thyroid disease     PAST  SURGICAL HISTORY:  Past Surgical History:  Procedure Laterality Date  . BREAST BIOPSY Left 03/2020   [facility]- Triple neg- Stage 3  . PORTACATH PLACEMENT Right 05/02/2020   Procedure: INSERTION PORT-A-CATH;  Surgeon: Herbert Pun, MD;  Location: [facility] ORS;  Service: General;  Laterality: Right;    HEMATOLOGY/ONCOLOGY HISTORY:  Oncology History Overview Note  # NOV 2021- LEFT BREAST CANCER - [place]; TRIPLE NEGATIVE; Grade of invasive carcinoma: Grade 3; # LYMPH NODE, LEFT AXILLA; [Dr.Cintron]; ULTRASOUND-GUIDED CORE BIOPSY:  - METASTATIC MAMMARY CARCINOMA, AT LEAST 9 MM METASTASIS. ]; DEC 9th PET scan-Intensely hypermetabolic 56.3 cm left breast mass compatible with primary left breast malignancy, with left pectoralis muscle involvement posteriorly and cutaneous involvement by the mass. Multiple smaller hypermetabolic satellite malignant nodules scattered throughout the left breast. Bulky hypermetabolic left axillary and left retropectoral nodal metastases.   # 12/10- Carboplatin ONLY;  Taxol hypersensitivity reaction-discontinued  # 12/17- AC #1 q 3 W x4; MARCH 28th- CARBO-ABRAXANE-KEYTRUDA; #cycle 1-8 [abraxane dose reduced to 75/m2- sec to diarrhea-3]  # SURVIVORSHIP:   # GENETICS: p  DIAGNOSIS: Triple negative breast cancer  STAGE: Stage III       ;  GOALS: Cure  CURRENT/MOST RECENT THERAPY : AC    Carcinoma of overlapping sites of left breast in female, estrogen receptor negative ([place])  04/24/2020 Initial Diagnosis   Carcinoma of overlapping sites of left breast in female, estrogen receptor negative ([place])   05/04/2020 - 05/04/2020 Chemotherapy   The patient had dexamethasone (DECADRON) 4 MG tablet, 8 mg, Oral, Daily, 1 of 1 cycle, Start date: --,  End date: -- palonosetron (ALOXI) injection 0.25 mg, 0.25 mg, Intravenous,  Once, 1 of 12 cycles Administration: 0.25 mg (05/04/2020) CARBOplatin (PARAPLATIN) 220 mg in sodium chloride 0.9 % 250 mL chemo infusion, 220 mg (100 % of  original dose 222.4 mg), Intravenous,  Once, 1 of 12 cycles Dose modification:   (original dose 222.4 mg, Cycle 1) Administration: 220 mg (05/04/2020) PACLitaxel (TAXOL) 132 mg in sodium chloride 0.9 % 250 mL chemo infusion (</= 27m/m2), 80 mg/m2 = 132 mg, Intravenous,  Once, 1 of 12 cycles Administration: 132 mg (05/04/2020)  for chemotherapy treatment.    05/11/2020 - 07/31/2020 Chemotherapy         06/14/2020 Cancer Staging   Staging form: Breast, AJCC 8th Edition - Clinical: Stage IIIC (cT4, cN2b, cM0, G3, ER-, PR-, HER2-) - Signed by BCammie Sickle MD on 06/14/2020   08/20/2020 -  Chemotherapy    Patient is on Treatment Plan: BREAST PEMBROLIZUMAB + CARBOPLATIN D1 + ABRAXANEL D1,8,15 Q21D X 4 CYCLES        ALLERGIES:  is allergic to paclitaxel.  MEDICATIONS:  Current Outpatient Medications  Medication Sig Dispense Refill  . acetaminophen (TYLENOL) 650 MG suppository Place 1 suppository (650 mg total) rectally every 6 (six) hours as needed for fever. 12 suppository 0  . BANOPHEN 25 MG capsule Take 25 mg by mouth every 6 (six) hours as needed for itching. (Patient not taking: No sig reported)    . benztropine (COGENTIN) 0.5 MG tablet Take 1 tablet (0.5 mg total) by mouth 2 (two) times daily. (Patient not taking: No sig reported) 60 tablet 1  . cetirizine (ZYRTEC) 10 MG tablet Take 1 tablet (10 mg total) by mouth daily as needed for allergies.    . clonazePAM (KLONOPIN) 0.5 MG tablet Take 0.5 mg by mouth at bedtime. (Patient not taking: No sig reported)    . diphenoxylate-atropine (LOMOTIL) 2.5-0.025 MG tablet Take 1 tablet by mouth 4 (four) times daily as needed for diarrhea or loose stools. Take it along with immodium (Patient not taking: No sig reported) 45 tablet 0  . divalproex (DEPAKOTE) 500 MG DR tablet Take 1 tablet (500 mg total) by mouth every 12 (twelve) hours. 60 tablet 1  . fluticasone (FLONASE) 50 MCG/ACT nasal spray Place 1 spray into both nostrils daily. 11.1  mL 9  . haloperidol (HALDOL) 5 MG tablet Take 1 tablet (5 mg total) by mouth 3 (three) times daily. 90 tablet 1  . haloperidol decanoate (HALDOL DECANOATE) 100 MG/ML injection Inject 100 mg into the muscle every 28 (twenty-eight) days.    . hydrOXYzine (ATARAX/VISTARIL) 25 MG tablet Take 1 tablet (25 mg total) by mouth every 8 (eight) hours as needed. 60 tablet 1  . levothyroxine (SYNTHROID) 88 MCG tablet Take 1 tablet (88 mcg total) by mouth daily at 6 (six) AM. 30 tablet 1  . lidocaine-prilocaine (EMLA) cream Apply 1 application topically as needed. 30 g 3  . medroxyPROGESTERone (DEPO-PROVERA) 150 MG/ML injection Inject into the muscle.    .Marland KitchenMELATONIN PO Take 1 tablet by mouth at bedtime as needed (sleep).    .Marland Kitchenolopatadine (PATANOL) 0.1 % ophthalmic solution Place 1 drop into both eyes 2 (two) times daily. 10 mL 1  . ondansetron (ZOFRAN) 8 MG tablet One pill every 8 hours as needed for nausea/vomitting. 40 tablet 1  . potassium chloride SA (KLOR-CON M20) 20 MEQ tablet Take 1 tablet (20 mEq total) by mouth 2 (two) times daily. 60 tablet 0  . prochlorperazine (  COMPAZINE) 10 MG tablet Take 1 tablet (10 mg total) by mouth every 6 (six) hours as needed for nausea or vomiting. 40 tablet 1  . sodium chloride 1 g tablet Take 1 tablet (1 g total) by mouth 3 (three) times daily. 90 tablet 0   No current facility-administered medications for this visit.   Facility-Administered Medications Ordered in Other Visits  Medication Dose Route Frequency Provider Last Rate Last Admin  . sodium chloride flush (NS) 0.9 % injection 10 mL  10 mL Intravenous PRN Cammie Sickle, MD   10 mL at 06/14/20 0840    VITAL SIGNS: There were no vitals taken for this visit. There were no vitals filed for this visit.  Estimated body mass index is 27.4 kg/m as calculated from the following:   Height as of an earlier encounter on 09/18/20: 5' 1" (1.549 m).   Weight as of an earlier encounter on 09/18/20: 145 lb (65.8  kg).  LABS: CBC:    Component Value Date/Time   WBC 4.2 09/18/2020 0827   HGB 10.3 (L) 09/18/2020 0827   HGB 13.4 11/11/2013 2142   HCT 29.0 (L) 09/18/2020 0827   HCT 39.3 11/11/2013 2142   PLT 172 09/18/2020 0827   PLT 295 11/11/2013 2142   MCV 92.9 09/18/2020 0827   MCV 90 11/11/2013 2142   NEUTROABS 2.7 09/18/2020 0827   LYMPHSABS 1.0 09/18/2020 0827   MONOABS 0.4 09/18/2020 0827   EOSABS 0.0 09/18/2020 0827   BASOSABS 0.0 09/18/2020 0827   Comprehensive Metabolic Panel:    Component Value Date/Time   NA 131 (L) 09/18/2020 0827   NA 135 (L) 11/14/2013 1309   K 3.6 09/18/2020 0827   K 3.9 11/14/2013 1309   CL 96 (L) 09/18/2020 0827   CL 97 (L) 11/14/2013 1309   CO2 23 09/18/2020 0827   CO2 29 11/14/2013 1309   BUN <5 (L) 09/18/2020 0827   BUN 3 (L) 11/14/2013 1309   CREATININE 0.51 09/18/2020 0827   CREATININE 0.88 11/14/2013 1309   GLUCOSE 91 09/18/2020 0827   GLUCOSE 117 (H) 11/14/2013 1309   CALCIUM 9.4 09/18/2020 0827   CALCIUM 9.6 11/14/2013 1309   AST 26 09/11/2020 0817   AST 25 11/11/2013 2142   ALT 11 09/11/2020 0817   ALT 18 11/11/2013 2142   ALKPHOS 57 09/11/2020 0817   ALKPHOS 111 11/11/2013 2142   BILITOT 0.3 09/11/2020 0817   BILITOT 0.3 11/11/2013 2142   PROT 7.5 09/11/2020 0817   PROT 8.1 11/11/2013 2142   ALBUMIN 3.8 09/11/2020 0817   ALBUMIN 3.8 11/11/2013 2142    RADIOGRAPHIC STUDIES: No results found.  PERFORMANCE STATUS (ECOG) : 1 - Symptomatic but completely ambulatory  Review of Systems Unless otherwise noted, a complete review of systems is negative.  Physical Exam General: NAD Pulmonary: Unlabored Extremities: no edema, no joint deformities Skin: no rashes Neurological: Weakness, confusion  IMPRESSION: Routine follow-up.  Patient was seen in infusion.  Patient reports she is doing well.  She denies any significant changes or concerns.  No symptomatic complaints at present.  No family currently here to address  goals.  PLAN: -Continue current scope of treatment -Follow-up MyChart visit 2 months   Patient expressed understanding and was in agreement with this plan. She also understands that She can call the clinic at any time with any questions, concerns, or complaints.     Time Total: 10 minutes  Visit consisted of counseling and education dealing with the complex and emotionally  intense issues of symptom management and palliative care in the setting of serious and potentially life-threatening illness.Greater than 50%  of this time was spent counseling and coordinating care related to the above assessment and plan.  Signed by: Altha Harm, PhD, NP-C

## 2020-09-25 ENCOUNTER — Inpatient Hospital Stay: Payer: Medicaid Other | Admitting: Oncology

## 2020-09-25 ENCOUNTER — Inpatient Hospital Stay: Payer: Medicaid Other

## 2020-09-26 ENCOUNTER — Inpatient Hospital Stay: Payer: Medicaid Other

## 2020-09-26 ENCOUNTER — Inpatient Hospital Stay (HOSPITAL_BASED_OUTPATIENT_CLINIC_OR_DEPARTMENT_OTHER): Payer: Medicaid Other | Admitting: Internal Medicine

## 2020-09-26 ENCOUNTER — Other Ambulatory Visit: Payer: Self-pay

## 2020-09-26 ENCOUNTER — Encounter: Payer: Self-pay | Admitting: Internal Medicine

## 2020-09-26 DIAGNOSIS — C778 Secondary and unspecified malignant neoplasm of lymph nodes of multiple regions: Secondary | ICD-10-CM | POA: Diagnosis not present

## 2020-09-26 DIAGNOSIS — Z171 Estrogen receptor negative status [ER-]: Secondary | ICD-10-CM

## 2020-09-26 DIAGNOSIS — Z5111 Encounter for antineoplastic chemotherapy: Secondary | ICD-10-CM | POA: Insufficient documentation

## 2020-09-26 DIAGNOSIS — C50812 Malignant neoplasm of overlapping sites of left female breast: Secondary | ICD-10-CM | POA: Insufficient documentation

## 2020-09-26 DIAGNOSIS — Z800 Family history of malignant neoplasm of digestive organs: Secondary | ICD-10-CM | POA: Diagnosis not present

## 2020-09-26 DIAGNOSIS — Z7189 Other specified counseling: Secondary | ICD-10-CM

## 2020-09-26 DIAGNOSIS — Z79899 Other long term (current) drug therapy: Secondary | ICD-10-CM | POA: Insufficient documentation

## 2020-09-26 DIAGNOSIS — F209 Schizophrenia, unspecified: Secondary | ICD-10-CM | POA: Insufficient documentation

## 2020-09-26 DIAGNOSIS — Z803 Family history of malignant neoplasm of breast: Secondary | ICD-10-CM | POA: Insufficient documentation

## 2020-09-26 DIAGNOSIS — K219 Gastro-esophageal reflux disease without esophagitis: Secondary | ICD-10-CM | POA: Insufficient documentation

## 2020-09-26 DIAGNOSIS — E079 Disorder of thyroid, unspecified: Secondary | ICD-10-CM | POA: Insufficient documentation

## 2020-09-26 DIAGNOSIS — E876 Hypokalemia: Secondary | ICD-10-CM | POA: Diagnosis not present

## 2020-09-26 DIAGNOSIS — E871 Hypo-osmolality and hyponatremia: Secondary | ICD-10-CM | POA: Diagnosis not present

## 2020-09-26 LAB — CBC WITH DIFFERENTIAL/PLATELET
Abs Immature Granulocytes: 0.03 10*3/uL (ref 0.00–0.07)
Basophils Absolute: 0 10*3/uL (ref 0.0–0.1)
Basophils Relative: 0 %
Eosinophils Absolute: 0 10*3/uL (ref 0.0–0.5)
Eosinophils Relative: 0 %
HCT: 26.7 % — ABNORMAL LOW (ref 36.0–46.0)
Hemoglobin: 9.9 g/dL — ABNORMAL LOW (ref 12.0–15.0)
Immature Granulocytes: 1 %
Lymphocytes Relative: 24 %
Lymphs Abs: 0.8 10*3/uL (ref 0.7–4.0)
MCH: 34 pg (ref 26.0–34.0)
MCHC: 37.1 g/dL — ABNORMAL HIGH (ref 30.0–36.0)
MCV: 91.8 fL (ref 80.0–100.0)
Monocytes Absolute: 0.5 10*3/uL (ref 0.1–1.0)
Monocytes Relative: 14 %
Neutro Abs: 2 10*3/uL (ref 1.7–7.7)
Neutrophils Relative %: 61 %
Platelets: 365 10*3/uL (ref 150–400)
RBC: 2.91 MIL/uL — ABNORMAL LOW (ref 3.87–5.11)
RDW: 14.6 % (ref 11.5–15.5)
Smear Review: ADEQUATE
WBC Morphology: ABNORMAL
WBC: 3.3 10*3/uL — ABNORMAL LOW (ref 4.0–10.5)
nRBC: 0 % (ref 0.0–0.2)

## 2020-09-26 LAB — COMPREHENSIVE METABOLIC PANEL
ALT: 10 U/L (ref 0–44)
AST: 20 U/L (ref 15–41)
Albumin: 3.7 g/dL (ref 3.5–5.0)
Alkaline Phosphatase: 55 U/L (ref 38–126)
Anion gap: 11 (ref 5–15)
BUN: <5 mg/dL — ABNORMAL LOW (ref 6–20)
CO2: 25 mmol/L (ref 22–32)
Calcium: 9.3 mg/dL (ref 8.9–10.3)
Chloride: 92 mmol/L — ABNORMAL LOW (ref 98–111)
Creatinine, Ser: 0.51 mg/dL (ref 0.44–1.00)
GFR, Estimated: >60 mL/min (ref >60)
Glucose, Bld: 87 mg/dL (ref 70–99)
Potassium: 3.6 mmol/L (ref 3.5–5.1)
Sodium: 128 mmol/L — ABNORMAL LOW (ref 135–145)
Total Bilirubin: 0.4 mg/dL (ref 0.3–1.2)
Total Protein: 7.2 g/dL (ref 6.5–8.1)

## 2020-09-26 MED ORDER — POTASSIUM CHLORIDE 20 MEQ/100ML IV SOLN
20.0000 meq | Freq: Once | INTRAVENOUS | Status: AC
  Administered 2020-09-26: 20 meq via INTRAVENOUS

## 2020-09-26 MED ORDER — SODIUM CHLORIDE 0.9 % IV SOLN
200.0000 mg | Freq: Once | INTRAVENOUS | Status: AC
  Administered 2020-09-26: 200 mg via INTRAVENOUS
  Filled 2020-09-26: qty 8

## 2020-09-26 MED ORDER — SODIUM CHLORIDE 0.9 % IV SOLN
10.0000 mg | Freq: Once | INTRAVENOUS | Status: AC
  Administered 2020-09-26: 10 mg via INTRAVENOUS
  Filled 2020-09-26: qty 10

## 2020-09-26 MED ORDER — HEPARIN SOD (PORK) LOCK FLUSH 100 UNIT/ML IV SOLN
500.0000 [IU] | Freq: Once | INTRAVENOUS | Status: DC
  Filled 2020-09-26: qty 5

## 2020-09-26 MED ORDER — FAMOTIDINE 20 MG IN NS 100 ML IVPB
20.0000 mg | Freq: Once | INTRAVENOUS | Status: AC
  Administered 2020-09-26: 20 mg via INTRAVENOUS
  Filled 2020-09-26: qty 20

## 2020-09-26 MED ORDER — SODIUM CHLORIDE 0.9% FLUSH
10.0000 mL | INTRAVENOUS | Status: DC | PRN
  Administered 2020-09-26: 10 mL via INTRAVENOUS
  Filled 2020-09-26: qty 10

## 2020-09-26 MED ORDER — SODIUM CHLORIDE 0.9 % IV SOLN
Freq: Once | INTRAVENOUS | Status: AC
  Filled 2020-09-26: qty 250

## 2020-09-26 MED ORDER — HEPARIN SOD (PORK) LOCK FLUSH 100 UNIT/ML IV SOLN
500.0000 [IU] | Freq: Once | INTRAVENOUS | Status: AC | PRN
  Administered 2020-09-26: 500 [IU]
  Filled 2020-09-26: qty 5

## 2020-09-26 MED ORDER — SODIUM CHLORIDE 0.9 % IV SOLN
615.0000 mg | Freq: Once | INTRAVENOUS | Status: AC
  Administered 2020-09-26: 620 mg via INTRAVENOUS
  Filled 2020-09-26: qty 62

## 2020-09-26 MED ORDER — SODIUM CHLORIDE 0.9 % IV SOLN
150.0000 mg | Freq: Once | INTRAVENOUS | Status: AC
  Administered 2020-09-26: 150 mg via INTRAVENOUS
  Filled 2020-09-26: qty 150

## 2020-09-26 MED ORDER — PACLITAXEL PROTEIN-BOUND CHEMO INJECTION 100 MG
75.0000 mg/m2 | Freq: Once | INTRAVENOUS | Status: AC
  Administered 2020-09-26: 125 mg via INTRAVENOUS
  Filled 2020-09-26: qty 25

## 2020-09-26 MED ORDER — PALONOSETRON HCL INJECTION 0.25 MG/5ML
0.2500 mg | Freq: Once | INTRAVENOUS | Status: AC
  Administered 2020-09-26: 0.25 mg via INTRAVENOUS
  Filled 2020-09-26: qty 5

## 2020-09-26 MED ORDER — DIPHENHYDRAMINE HCL 50 MG/ML IJ SOLN
50.0000 mg | Freq: Once | INTRAMUSCULAR | Status: AC
  Administered 2020-09-26: 50 mg via INTRAVENOUS
  Filled 2020-09-26: qty 1

## 2020-09-26 NOTE — Patient Instructions (Signed)
[facility]  Discharge Instructions: Thank you for choosing [facility] to provide your oncology and hematology care.  If you have a lab appointment with the [facility], please go directly to the [facility] and check in at the registration area.  Wear comfortable clothing and clothing appropriate for easy access to any Portacath or PICC line.   We strive to give you quality time with your provider. You may need to reschedule your appointment if you arrive late (15 or more minutes).  Arriving late affects you and other patients whose appointments are after yours.  Also, if you miss three or more appointments without notifying the office, you may be dismissed from the clinic at the provider's discretion.      For prescription refill requests, have your pharmacy contact our office and allow 72 hours for refills to be completed.    Today you received the following chemotherapy and/or immunotherapy agents Abraxane, carboplatin, Beryle Flock      To help prevent nausea and vomiting after your treatment, we encourage you to take your nausea medication as directed.  BELOW ARE SYMPTOMS THAT SHOULD BE REPORTED IMMEDIATELY: . *FEVER GREATER THAN 100.4 F (38 C) OR HIGHER . *CHILLS OR SWEATING . *NAUSEA AND VOMITING THAT IS NOT CONTROLLED WITH YOUR NAUSEA MEDICATION . *UNUSUAL SHORTNESS OF BREATH . *UNUSUAL BRUISING OR BLEEDING . *URINARY PROBLEMS (pain or burning when urinating, or frequent urination) . *BOWEL PROBLEMS (unusual diarrhea, constipation, pain near the anus) . TENDERNESS IN MOUTH AND THROAT WITH OR WITHOUT PRESENCE OF ULCERS (sore throat, sores in mouth, or a toothache) . UNUSUAL RASH, SWELLING OR PAIN  . UNUSUAL VAGINAL DISCHARGE OR ITCHING   Items with * indicate a potential emergency and should be followed up as soon as possible or go to the Emergency Department if any problems should occur.  Please show the CHEMOTHERAPY ALERT CARD  or IMMUNOTHERAPY ALERT CARD at check-in to the Emergency Department and triage nurse.  Should you have questions after your visit or need to cancel or reschedule your appointment, please contact [facility]  [phone] and follow the prompts.  Office hours are 8:00 a.m. to 4:30 p.m. Monday - Friday. Please note that voicemails left after 4:00 p.m. may not be returned until the following business day.  We are closed weekends and major holidays. You have access to a nurse at all times for urgent questions. Please call the main number to the clinic [phone] and follow the prompts.  For any non-urgent questions, you may also contact your provider using MyChart. We now offer e-Visits for anyone 36 and older to request care online for non-urgent symptoms. For details visit [email or web address].   Also download the MyChart app! Go to the app store, search "MyChart", open the app, select [facility], and log in with your MyChart username and password.  Due to Covid, a mask is required upon entering the hospital/clinic. If you do not have a mask, one will be given to you upon arrival. For doctor visits, patients may have 1 support person aged 62 or older with them. For treatment visits, patients cannot have anyone with them due to current Covid guidelines and our immunocompromised population. Carboplatin injection What is this medicine? CARBOPLATIN (KAR boe pla tin) is a chemotherapy drug. It targets fast dividing cells, like cancer cells, and causes these cells to die. This medicine is used to treat ovarian cancer and many other cancers. This medicine may be  used for other purposes; ask your health care provider or pharmacist if you have questions. COMMON BRAND NAME(S): Paraplatin What should I tell my health care provider before I take this medicine? They need to know if you have any of these conditions:  blood disorders  hearing problems  kidney  disease  recent or ongoing radiation therapy  an unusual or allergic reaction to carboplatin, cisplatin, other chemotherapy, other medicines, foods, dyes, or preservatives  pregnant or trying to get pregnant  breast-feeding How should I use this medicine? This drug is usually given as an infusion into a vein. It is administered in a hospital or clinic by a specially trained health care professional. Talk to your pediatrician regarding the use of this medicine in children. Special care may be needed. Overdosage: If you think you have taken too much of this medicine contact a poison control center or emergency room at once. NOTE: This medicine is only for you. Do not share this medicine with others. What if I miss a dose? It is important not to miss a dose. Call your doctor or health care professional if you are unable to keep an appointment. What may interact with this medicine?  medicines for seizures  medicines to increase blood counts like filgrastim, pegfilgrastim, sargramostim  some antibiotics like amikacin, gentamicin, neomycin, streptomycin, tobramycin  vaccines Talk to your doctor or health care professional before taking any of these medicines:  acetaminophen  aspirin  ibuprofen  ketoprofen  naproxen This list may not describe all possible interactions. Give your health care provider a list of all the medicines, herbs, non-prescription drugs, or dietary supplements you use. Also tell them if you smoke, drink alcohol, or use illegal drugs. Some items may interact with your medicine. What should I watch for while using this medicine? Your condition will be monitored carefully while you are receiving this medicine. You will need important blood work done while you are taking this medicine. This drug may make you feel generally unwell. This is not uncommon, as chemotherapy can affect healthy cells as well as cancer cells. Report any side effects. Continue your course of  treatment even though you feel ill unless your doctor tells you to stop. In some cases, you may be given additional medicines to help with side effects. Follow all directions for their use. Call your doctor or health care professional for advice if you get a fever, chills or sore throat, or other symptoms of a cold or flu. Do not treat yourself. This drug decreases your body's ability to fight infections. Try to avoid being around people who are sick. This medicine may increase your risk to bruise or bleed. Call your doctor or health care professional if you notice any unusual bleeding. Be careful brushing and flossing your teeth or using a toothpick because you may get an infection or bleed more easily. If you have any dental work done, tell your dentist you are receiving this medicine. Avoid taking products that contain aspirin, acetaminophen, ibuprofen, naproxen, or ketoprofen unless instructed by your doctor. These medicines may hide a fever. Do not become pregnant while taking this medicine. Women should inform their doctor if they wish to become pregnant or think they might be pregnant. There is a potential for serious side effects to an unborn child. Talk to your health care professional or pharmacist for more information. Do not breast-feed an infant while taking this medicine. What side effects may I notice from receiving this medicine? Side effects that you should  report to your doctor or health care professional as soon as possible:  allergic reactions like skin rash, itching or hives, swelling of the face, lips, or tongue  signs of infection - fever or chills, cough, sore throat, pain or difficulty passing urine  signs of decreased platelets or bleeding - bruising, pinpoint red spots on the skin, black, tarry stools, nosebleeds  signs of decreased red blood cells - unusually weak or tired, fainting spells, lightheadedness  breathing problems  changes in hearing  changes in  vision  chest pain  high blood pressure  low blood counts - This drug may decrease the number of white blood cells, red blood cells and platelets. You may be at increased risk for infections and bleeding.  nausea and vomiting  pain, swelling, redness or irritation at the injection site  pain, tingling, numbness in the hands or feet  problems with balance, talking, walking  trouble passing urine or change in the amount of urine Side effects that usually do not require medical attention (report to your doctor or health care professional if they continue or are bothersome):  hair loss  loss of appetite  metallic taste in the mouth or changes in taste This list may not describe all possible side effects. Call your doctor for medical advice about side effects. You may report side effects to FDA at 1-800-FDA-1088. Where should I keep my medicine? This drug is given in a hospital or clinic and will not be stored at home. NOTE: This sheet is a summary. It may not cover all possible information. If you have questions about this medicine, talk to your doctor, pharmacist, or health care provider.  2021 Elsevier/Gold Standard (2007-08-17 14:38:05) Pembrolizumab injection What is this medicine? PEMBROLIZUMAB (pem broe liz ue mab) is a monoclonal antibody. It is used to treat certain types of cancer. This medicine may be used for other purposes; ask your health care provider or pharmacist if you have questions. COMMON BRAND NAME(S): Keytruda What should I tell my health care provider before I take this medicine? They need to know if you have any of these conditions:  autoimmune diseases like Crohn's disease, ulcerative colitis, or lupus  have had or planning to have an allogeneic stem cell transplant (uses someone else's stem cells)  history of organ transplant  history of chest radiation  nervous system problems like myasthenia gravis or Guillain-Barre syndrome  an unusual or  allergic reaction to pembrolizumab, other medicines, foods, dyes, or preservatives  pregnant or trying to get pregnant  breast-feeding How should I use this medicine? This medicine is for infusion into a vein. It is given by a health care professional in a hospital or clinic setting. A special MedGuide will be given to you before each treatment. Be sure to read this information carefully each time. Talk to your pediatrician regarding the use of this medicine in children. While this drug may be prescribed for children as young as 6 months for selected conditions, precautions do apply. Overdosage: If you think you have taken too much of this medicine contact a poison control center or emergency room at once. NOTE: This medicine is only for you. Do not share this medicine with others. What if I miss a dose? It is important not to miss your dose. Call your doctor or health care professional if you are unable to keep an appointment. What may interact with this medicine? Interactions have not been studied. This list may not describe all possible interactions. Give your health  care provider a list of all the medicines, herbs, non-prescription drugs, or dietary supplements you use. Also tell them if you smoke, drink alcohol, or use illegal drugs. Some items may interact with your medicine. What should I watch for while using this medicine? Your condition will be monitored carefully while you are receiving this medicine. You may need blood work done while you are taking this medicine. Do not become pregnant while taking this medicine or for 4 months after stopping it. Women should inform their doctor if they wish to become pregnant or think they might be pregnant. There is a potential for serious side effects to an unborn child. Talk to your health care professional or pharmacist for more information. Do not breast-feed an infant while taking this medicine or for 4 months after the last dose. What side  effects may I notice from receiving this medicine? Side effects that you should report to your doctor or health care professional as soon as possible:  allergic reactions like skin rash, itching or hives, swelling of the face, lips, or tongue  bloody or black, tarry  breathing problems  changes in vision  chest pain  chills  confusion  constipation  cough  diarrhea  dizziness or feeling faint or lightheaded  fast or irregular heartbeat  fever  flushing  joint pain  low blood counts - this medicine may decrease the number of white blood cells, red blood cells and platelets. You may be at increased risk for infections and bleeding.  muscle pain  muscle weakness  pain, tingling, numbness in the hands or feet  persistent headache  redness, blistering, peeling or loosening of the skin, including inside the mouth  signs and symptoms of high blood sugar such as dizziness; dry mouth; dry skin; fruity breath; nausea; stomach pain; increased hunger or thirst; increased urination  signs and symptoms of kidney injury like trouble passing urine or change in the amount of urine  signs and symptoms of liver injury like dark urine, light-colored stools, loss of appetite, nausea, right upper belly pain, yellowing of the eyes or skin  sweating  swollen lymph nodes  weight loss Side effects that usually do not require medical attention (report to your doctor or health care professional if they continue or are bothersome):  decreased appetite  hair loss  tiredness This list may not describe all possible side effects. Call your doctor for medical advice about side effects. You may report side effects to FDA at 1-800-FDA-1088. Where should I keep my medicine? This drug is given in a hospital or clinic and will not be stored at home. NOTE: This sheet is a summary. It may not cover all possible information. If you have questions about this medicine, talk to your doctor,  pharmacist, or health care provider.  2021 Elsevier/Gold Standard (2019-04-13 21:44:53) Nanoparticle Albumin-Bound Paclitaxel injection What is this medicine? NANOPARTICLE ALBUMIN-BOUND PACLITAXEL (Na no PAHR ti kuhl al BYOO muhn-bound PAK li TAX el) is a chemotherapy drug. It targets fast dividing cells, like cancer cells, and causes these cells to die. This medicine is used to treat advanced breast cancer, lung cancer, and pancreatic cancer. This medicine may be used for other purposes; ask your health care provider or pharmacist if you have questions. COMMON BRAND NAME(S): Abraxane What should I tell my health care provider before I take this medicine? They need to know if you have any of these conditions:  kidney disease  liver disease  low blood counts, like low white cell,  platelet, or red cell counts  lung or breathing disease, like asthma  tingling of the fingers or toes, or other nerve disorder  an unusual or allergic reaction to paclitaxel, albumin, other chemotherapy, other medicines, foods, dyes, or preservatives  pregnant or trying to get pregnant  breast-feeding How should I use this medicine? This drug is given as an infusion into a vein. It is administered in a hospital or clinic by a specially trained health care professional. Talk to your pediatrician regarding the use of this medicine in children. Special care may be needed. Overdosage: If you think you have taken too much of this medicine contact a poison control center or emergency room at once. NOTE: This medicine is only for you. Do not share this medicine with others. What if I miss a dose? It is important not to miss your dose. Call your doctor or health care professional if you are unable to keep an appointment. What may interact with this medicine? This medicine may interact with the following medications:  antiviral medicines for hepatitis, HIV or AIDS  certain antibiotics like erythromycin and  clarithromycin  certain medicines for fungal infections like ketoconazole and itraconazole  certain medicines for seizures like carbamazepine, phenobarbital, phenytoin  gemfibrozil  nefazodone  rifampin  St. John's wort This list may not describe all possible interactions. Give your health care provider a list of all the medicines, herbs, non-prescription drugs, or dietary supplements you use. Also tell them if you smoke, drink alcohol, or use illegal drugs. Some items may interact with your medicine. What should I watch for while using this medicine? Your condition will be monitored carefully while you are receiving this medicine. You will need important blood work done while you are taking this medicine. This medicine can cause serious allergic reactions. If you experience allergic reactions like skin rash, itching or hives, swelling of the face, lips, or tongue, tell your doctor or health care professional right away. In some cases, you may be given additional medicines to help with side effects. Follow all directions for their use. This drug may make you feel generally unwell. This is not uncommon, as chemotherapy can affect healthy cells as well as cancer cells. Report any side effects. Continue your course of treatment even though you feel ill unless your doctor tells you to stop. Call your doctor or health care professional for advice if you get a fever, chills or sore throat, or other symptoms of a cold or flu. Do not treat yourself. This drug decreases your body's ability to fight infections. Try to avoid being around people who are sick. This medicine may increase your risk to bruise or bleed. Call your doctor or health care professional if you notice any unusual bleeding. Be careful brushing and flossing your teeth or using a toothpick because you may get an infection or bleed more easily. If you have any dental work done, tell your dentist you are receiving this medicine. Avoid  taking products that contain aspirin, acetaminophen, ibuprofen, naproxen, or ketoprofen unless instructed by your doctor. These medicines may hide a fever. Do not become pregnant while taking this medicine or for 6 months after stopping it. Women should inform their doctor if they wish to become pregnant or think they might be pregnant. Men should not father a child while taking this medicine or for 3 months after stopping it. There is a potential for serious side effects to an unborn child. Talk to your health care professional or pharmacist for more  information. Do not breast-feed an infant while taking this medicine or for 2 weeks after stopping it. This medicine may interfere with the ability to get pregnant or to father a child. You should talk to your doctor or health care professional if you are concerned about your fertility. What side effects may I notice from receiving this medicine? Side effects that you should report to your doctor or health care professional as soon as possible:  allergic reactions like skin rash, itching or hives, swelling of the face, lips, or tongue  breathing problems  changes in vision  fast, irregular heartbeat  low blood pressure  mouth sores  pain, tingling, numbness in the hands or feet  signs of decreased platelets or bleeding - bruising, pinpoint red spots on the skin, black, tarry stools, blood in the urine  signs of decreased red blood cells - unusually weak or tired, feeling faint or lightheaded, falls  signs of infection - fever or chills, cough, sore throat, pain or difficulty passing urine  signs and symptoms of liver injury like dark yellow or brown urine; general ill feeling or flu-like symptoms; light-colored stools; loss of appetite; nausea; right upper belly pain; unusually weak or tired; yellowing of the eyes or skin  swelling of the ankles, feet, hands  unusually slow heartbeat Side effects that usually do not require medical  attention (report to your doctor or health care professional if they continue or are bothersome):  diarrhea  hair loss  loss of appetite  nausea, vomiting  tiredness This list may not describe all possible side effects. Call your doctor for medical advice about side effects. You may report side effects to FDA at 1-800-FDA-1088. Where should I keep my medicine? This drug is given in a hospital or clinic and will not be stored at home. NOTE: This sheet is a summary. It may not cover all possible information. If you have questions about this medicine, talk to your doctor, pharmacist, or health care provider.  2021 Elsevier/Gold Standard (2017-01-13 13:03:45)

## 2020-09-26 NOTE — Progress Notes (Signed)
[place] [facility] [place] CONSULT NOTE  Patient Care Team: Physicians, [place] as PCP - General Vladimir Crofts, MD as Consulting Physician (Neurology) Cammie Sickle, MD as Consulting Physician (Internal Medicine) Randel Pigg, MD as Consulting Physician (Psychiatry)  CHIEF COMPLAINTS/PURPOSE OF CONSULTATION: Breast cancer  #  Oncology History Overview Note  # NOV 2021- LEFT BREAST CANCER - [place]; TRIPLE NEGATIVE; Grade of invasive carcinoma: Grade 3; # LYMPH NODE, LEFT AXILLA; [Dr.Cintron]; ULTRASOUND-GUIDED CORE BIOPSY:  - METASTATIC MAMMARY CARCINOMA, AT LEAST 9 MM METASTASIS. ]; DEC 9th PET scan-Intensely hypermetabolic 40.8 cm left breast mass compatible with primary left breast malignancy, with left pectoralis muscle involvement posteriorly and cutaneous involvement by the mass. Multiple smaller hypermetabolic satellite malignant nodules scattered throughout the left breast. Bulky hypermetabolic left axillary and left retropectoral nodal metastases.   # 12/10- Carboplatin ONLY;  Taxol hypersensitivity reaction-discontinued  # 12/17- AC #1 q 3 W x4; MARCH 28th- CARBO-ABRAXANE-KEYTRUDA; #cycle 1-8 [abraxane dose reduced to 75/m2- sec to diarrhea-3]  # SURVIVORSHIP:   # GENETICS: p  DIAGNOSIS: Triple negative breast cancer  STAGE: Stage III       ;  GOALS: Cure  CURRENT/MOST RECENT THERAPY : AC    Carcinoma of overlapping sites of left breast in female, estrogen receptor negative ([place])  04/24/2020 Initial Diagnosis   Carcinoma of overlapping sites of left breast in female, estrogen receptor negative ([place])   05/04/2020 - 05/04/2020 Chemotherapy   The patient had dexamethasone (DECADRON) 4 MG tablet, 8 mg, Oral, Daily, 1 of 1 cycle, Start date: --, End date: -- palonosetron (ALOXI) injection 0.25 mg, 0.25 mg, Intravenous,  Once, 1 of 12 cycles Administration: 0.25 mg (05/04/2020) CARBOplatin (PARAPLATIN) 220 mg in sodium chloride 0.9 % 250 mL chemo infusion,  220 mg (100 % of original dose 222.4 mg), Intravenous,  Once, 1 of 12 cycles Dose modification:   (original dose 222.4 mg, Cycle 1) Administration: 220 mg (05/04/2020) PACLitaxel (TAXOL) 132 mg in sodium chloride 0.9 % 250 mL chemo infusion (</= 32m/m2), 80 mg/m2 = 132 mg, Intravenous,  Once, 1 of 12 cycles Administration: 132 mg (05/04/2020)  for chemotherapy treatment.    05/11/2020 - 07/31/2020 Chemotherapy         06/14/2020 Cancer Staging   Staging form: Breast, AJCC 8th Edition - Clinical: Stage IIIC (cT4, cN2b, cM0, G3, ER-, PR-, HER2-) - Signed by BCammie Sickle MD on 06/14/2020   08/20/2020 -  Chemotherapy    Patient is on Treatment Plan: BREAST PEMBROLIZUMAB + CARBOPLATIN D1 + ABRAXANEL D1,8,15 Q21D X 4 CYCLES        HISTORY OF PRESENTING ILLNESS: Patient is accompanied by her mother/patient is poor historian. Tamara [email or web address].  female with schizophrenia and left locally advanced triple negative breast cancer T4 N2; currently on neoadjuvant chemotherapy with carboplatin and Abraxane Keytruda.  Patient is currently status post cycle number 1 day 15  Abraxane approximately 1 week ago.  Patient denies any chest pain or shortness of breath or cough.  Denies any headache.  No diarrhea.  Complains of pain changes.  Review of Systems  Unable to perform ROS: Psychiatric disorder     MEDICAL HISTORY:  Past Medical History:  Diagnosis Date  . Family history of breast cancer   . Family history of stomach cancer   . Family history of stomach cancer   . GERD (gastroesophageal reflux disease)   . IUD (intrauterine device) in place IUD was placed in 2016  . Leiomyoma  of uterus   . Schizophrenia ([place])   . Thyroid disease     SURGICAL HISTORY: Past Surgical History:  Procedure Laterality Date  . BREAST BIOPSY Left 03/2020   [facility]- Triple neg- Stage 3  . PORTACATH PLACEMENT Right 05/02/2020   Procedure: INSERTION PORT-A-CATH;  Surgeon: Herbert Pun, MD;   Location: [facility] ORS;  Service: General;  Laterality: Right;    SOCIAL HISTORY: Social History   Socioeconomic History  . Marital status: Single    Spouse name: Not on file  . Number of children: Not on file  . Years of education: Not on file  . Highest education level: Not on file  Occupational History  . Not on file  Tobacco Use  . Smoking status: Never Smoker  . Smokeless tobacco: Never Used  Substance and Sexual Activity  . Alcohol use: No  . Drug use: No  . Sexual activity: Yes    Birth control/protection: Surgical  Other Topics Concern  . Not on file  Social History Narrative  . Not on file   Social Determinants of Health   Financial Resource Strain: Not on file  Food Insecurity: Not on file  Transportation Needs: Not on file  Physical Activity: Not on file  Stress: Not on file  Social Connections: Not on file  Intimate Partner Violence: Not on file    FAMILY HISTORY: Family History  Problem Relation Age of Onset  . Breast cancer Mother   . Stomach cancer Maternal Grandfather   . Other Maternal Aunt        died from tumor, brain?    ALLERGIES:  is allergic to paclitaxel.  MEDICATIONS:  Current Outpatient Medications  Medication Sig Dispense Refill  . acetaminophen (TYLENOL) 650 MG CR tablet Take 650 mg by mouth every 8 (eight) hours as needed for pain.    Marland Kitchen BANOPHEN 25 MG capsule Take 25 mg by mouth every 6 (six) hours as needed for itching.    . benztropine (COGENTIN) 0.5 MG tablet Take 1 tablet (0.5 mg total) by mouth 2 (two) times daily. 60 tablet 1  . cetirizine (ZYRTEC) 10 MG tablet Take 1 tablet (10 mg total) by mouth daily as needed for allergies.    . clonazePAM (KLONOPIN) 0.5 MG tablet Take 0.5 mg by mouth at bedtime.    . diphenoxylate-atropine (LOMOTIL) 2.5-0.025 MG tablet Take 1 tablet by mouth 4 (four) times daily as needed for diarrhea or loose stools. Take it along with immodium 45 tablet 0  . divalproex (DEPAKOTE) 500 MG DR tablet Take 1  tablet (500 mg total) by mouth every 12 (twelve) hours. 60 tablet 1  . fluticasone (FLONASE) 50 MCG/ACT nasal spray Place 1 spray into both nostrils daily. 11.1 mL 9  . haloperidol (HALDOL) 5 MG tablet Take 1 tablet (5 mg total) by mouth 3 (three) times daily. 90 tablet 1  . haloperidol decanoate (HALDOL DECANOATE) 100 MG/ML injection Inject 100 mg into the muscle every 28 (twenty-eight) days.    . hydrOXYzine (ATARAX/VISTARIL) 25 MG tablet Take 1 tablet (25 mg total) by mouth every 8 (eight) hours as needed. 60 tablet 1  . levothyroxine (SYNTHROID) 88 MCG tablet Take 1 tablet (88 mcg total) by mouth daily at 6 (six) AM. 30 tablet 1  . lidocaine-prilocaine (EMLA) cream Apply 1 application topically as needed. 30 g 3  . medroxyPROGESTERone (DEPO-PROVERA) 150 MG/ML injection Inject into the muscle.    Marland Kitchen MELATONIN PO Take 1 tablet by mouth at bedtime as needed (sleep).    Marland Kitchen  olopatadine (PATANOL) 0.1 % ophthalmic solution Place 1 drop into both eyes 2 (two) times daily. 10 mL 1  . ondansetron (ZOFRAN) 8 MG tablet One pill every 8 hours as needed for nausea/vomitting. 40 tablet 1  . potassium chloride SA (KLOR-CON M20) 20 MEQ tablet Take 1 tablet (20 mEq total) by mouth 2 (two) times daily. 60 tablet 0  . prochlorperazine (COMPAZINE) 10 MG tablet Take 1 tablet (10 mg total) by mouth every 6 (six) hours as needed for nausea or vomiting. 40 tablet 1  . sodium chloride 1 g tablet Take 1 tablet (1 g total) by mouth 3 (three) times daily. 90 tablet 0   No current facility-administered medications for this visit.   Facility-Administered Medications Ordered in Other Visits  Medication Dose Route Frequency Provider Last Rate Last Admin  . CARBOplatin (PARAPLATIN) 620 mg in sodium chloride 0.9 % 250 mL chemo infusion  620 mg Intravenous Once Charlaine Dalton R, MD      . heparin lock flush 100 unit/mL  500 Units Intravenous Once Charlaine Dalton R, MD      . heparin lock flush 100 unit/mL  500 Units  Intracatheter Once PRN Cammie Sickle, MD      . PACLitaxel-protein bound (ABRAXANE) chemo infusion 125 mg  75 mg/m2 (Treatment Plan Recorded) Intravenous Once Cammie Sickle, MD 50 mL/hr at 09/26/20 1212 125 mg at 09/26/20 1212  . sodium chloride flush (NS) 0.9 % injection 10 mL  10 mL Intravenous PRN Cammie Sickle, MD   10 mL at 06/14/20 0840  . sodium chloride flush (NS) 0.9 % injection 10 mL  10 mL Intravenous PRN Cammie Sickle, MD   10 mL at 09/26/20 0806      .  PHYSICAL EXAMINATION: ECOG PERFORMANCE STATUS: 1 - Symptomatic but completely ambulatory  Vitals:   09/26/20 0829  BP: 119/73  Pulse: 89  Resp: 20  Temp: (!) 96.2 F (35.7 C)   Filed Weights   09/26/20 0829  Weight: 148 lb (67.1 kg)    Physical Exam Constitutional:      Comments: Accompanied by sister.  Ambulating independently.  HENT:     Head: Normocephalic and atraumatic.     Mouth/Throat:     Pharynx: No oropharyngeal exudate.  Eyes:     Pupils: Pupils are equal, round, and reactive to light.  Cardiovascular:     Rate and Rhythm: Normal rate and regular rhythm.  Pulmonary:     Effort: No respiratory distress.     Breath sounds: No wheezing.  Abdominal:     General: Bowel sounds are normal. There is no distension.     Palpations: Abdomen is soft. There is no mass.     Tenderness: There is no abdominal tenderness. There is no guarding or rebound.  Musculoskeletal:        General: No tenderness. Normal range of motion.     Cervical back: Normal range of motion and neck supple.  Skin:    General: Skin is warm.     Comments: Large left-sided breast ~ 5 x 4cm (overall improved).  However significant improvement of the breast edema; improvement of the lymphadenopathy  Neurological:     Mental Status: She is alert and oriented to person, place, and time.  Psychiatric:        Mood and Affect: Affect normal.     Comments: Poor insight/tangential thoughts.  Unable to make a  coherent conversation-underlying schizophrenia.      LABORATORY DATA:  I have reviewed the data as listed Lab Results  Component Value Date   WBC 3.3 (L) 09/26/2020   HGB 9.9 (L) 09/26/2020   HCT 26.7 (L) 09/26/2020   MCV 91.8 09/26/2020   PLT 365 09/26/2020   Recent Labs    08/28/20 0823 09/04/20 0900 09/11/20 0817 09/18/20 0827 09/26/20 0754  NA 130*   < > 132* 131* 128*  K 2.5*   < > 3.2* 3.6 3.6  CL 90*   < > 97* 96* 92*  CO2 28   < > _0 GLUCOSE 105*   < > 156* 91 87  BUN <5*   < > <5* <5* <5*  CREATININE 0.48   < > 0.54 0.51 0.51  CALCIUM 9.4   < > 9.4 9.4 9.3  GFRNONAA >60   < > >60 >60 >60  PROT 7.5  --  7.5  --  7.2  ALBUMIN 3.9  --  3.8  --  3.7  AST 23  --  26  --  20  ALT 15  --  11  --  10  ALKPHOS 56  --  57  --  55  BILITOT 0.3  --  0.3  --  0.4   < > = values in this interval not displayed.    RADIOGRAPHIC STUDIES: I have personally reviewed the radiological images as listed and agreed with the findings in the report. No results found.    06/04/2020-     07/30/2020-    On 4/26-    09/26/2020      ASSESSMENT & PLAN:   Carcinoma of overlapping sites of left breast in female, estrogen receptor negative ([place]) #Left breast cancer T4 N2 M0-stage III triple negative; s/p Adriamycin Cytoxan every 3 weeks x4; US/mamo- March 25th- partial response noted [currently 4cm; improved LN]; currently- s/p carboplatin-Keytruda-Abraxane cycle #1 d-15   # proceed with carbo-abraxane- Keytruda- cycle 2- day-1 Labs today reviewed;  acceptable for treatment today;  dose of abraxane [19m/m2].   # GI-Diarrhea/nausea/vomitting-likely secondary to Abraxane [reduce the dose]. Better on imodium/lomotil prn.  STABLE.   #Electrolytes abnormalities: chronic hyponatremia-likely secondary to SIDH/psychiatric medication; sodium today 132; Potassium 3.6; continue KCL at home. STABLE.  # schizophrenia: STABLE; Tritnity [phone] Dr.Ahluwalia].   #  Genetics: s/p genetic testing/counseling; awaiting results now.   # DISPOSITION: #  chemo today;  Kcl 20 meq;   # as planned in 1  weeks MD; labs- cbc/bmp;abraxane; possible 20 KCL  # in 2 week X-MD; labs- cbc/bmp; abraxane; possible 20 KCL- Dr.B    All questions were answered. The patient knows to call the clinic with any problems, questions or concerns.    GCammie Sickle MD 09/26/2020 12:38 PM

## 2020-09-26 NOTE — Assessment & Plan Note (Addendum)
#  Left breast cancer T4 N2 M0-stage III triple negative; s/p Adriamycin Cytoxan every 3 weeks x4; US/mamo- March 25th- partial response noted [currently 4cm; improved LN]; currently- s/p carboplatin-Keytruda-Abraxane cycle #1 d-15   # proceed with carbo-abraxane- Keytruda- cycle 2- day-1 Labs today reviewed;  acceptable for treatment today;  dose of abraxane [75mg /m2].   # GI-Diarrhea/nausea/vomitting-likely secondary to Abraxane [reduce the dose]. Better on imodium/lomotil prn.  STABLE.   #Electrolytes abnormalities: chronic hyponatremia-likely secondary to SIDH/psychiatric medication; sodium today 132; Potassium 3.6; continue KCL at home. STABLE.  # schizophrenia: STABLE; Tritnity [phone]; Dr.Ahluwalia].   # Genetics: s/p genetic testing/counseling; awaiting results now.   # DISPOSITION: #  chemo today;  Kcl 20 meq;   # as planned in 1  weeks MD; labs- cbc/bmp;abraxane; possible 20 KCL  # in 2 week X-MD; labs- cbc/bmp; abraxane; possible 20 KCL- Dr.B

## 2020-09-26 NOTE — Progress Notes (Signed)
RN Chaperoned provider with Breast Exam.   

## 2020-10-02 ENCOUNTER — Inpatient Hospital Stay: Payer: Medicaid Other

## 2020-10-02 ENCOUNTER — Other Ambulatory Visit: Payer: Self-pay

## 2020-10-02 ENCOUNTER — Telehealth: Payer: Self-pay | Admitting: Licensed Clinical Social Worker

## 2020-10-02 ENCOUNTER — Inpatient Hospital Stay: Payer: Medicaid Other | Attending: Internal Medicine | Admitting: Internal Medicine

## 2020-10-02 VITALS — BP 122/75 | HR 95 | Temp 97.7°F | Resp 20 | Ht 61.0 in | Wt 147.0 lb

## 2020-10-02 DIAGNOSIS — Z171 Estrogen receptor negative status [ER-]: Secondary | ICD-10-CM

## 2020-10-02 DIAGNOSIS — Z803 Family history of malignant neoplasm of breast: Secondary | ICD-10-CM | POA: Insufficient documentation

## 2020-10-02 DIAGNOSIS — E871 Hypo-osmolality and hyponatremia: Secondary | ICD-10-CM | POA: Insufficient documentation

## 2020-10-02 DIAGNOSIS — C50812 Malignant neoplasm of overlapping sites of left female breast: Secondary | ICD-10-CM | POA: Insufficient documentation

## 2020-10-02 DIAGNOSIS — Z79899 Other long term (current) drug therapy: Secondary | ICD-10-CM | POA: Insufficient documentation

## 2020-10-02 DIAGNOSIS — C778 Secondary and unspecified malignant neoplasm of lymph nodes of multiple regions: Secondary | ICD-10-CM | POA: Insufficient documentation

## 2020-10-02 DIAGNOSIS — Z800 Family history of malignant neoplasm of digestive organs: Secondary | ICD-10-CM | POA: Insufficient documentation

## 2020-10-02 DIAGNOSIS — F209 Schizophrenia, unspecified: Secondary | ICD-10-CM | POA: Insufficient documentation

## 2020-10-02 DIAGNOSIS — E876 Hypokalemia: Secondary | ICD-10-CM | POA: Insufficient documentation

## 2020-10-02 LAB — CBC WITH DIFFERENTIAL/PLATELET
Abs Immature Granulocytes: 0.06 10*3/uL (ref 0.00–0.07)
Basophils Absolute: 0 10*3/uL (ref 0.0–0.1)
Basophils Relative: 1 %
Eosinophils Absolute: 0 10*3/uL (ref 0.0–0.5)
Eosinophils Relative: 0 %
HCT: 26.6 % — ABNORMAL LOW (ref 36.0–46.0)
Hemoglobin: 9.5 g/dL — ABNORMAL LOW (ref 12.0–15.0)
Immature Granulocytes: 2 %
Lymphocytes Relative: 26 %
Lymphs Abs: 0.9 10*3/uL (ref 0.7–4.0)
MCH: 32.9 pg (ref 26.0–34.0)
MCHC: 35.7 g/dL (ref 30.0–36.0)
MCV: 92 fL (ref 80.0–100.0)
Monocytes Absolute: 0.2 10*3/uL (ref 0.1–1.0)
Monocytes Relative: 7 %
Neutro Abs: 2.2 10*3/uL (ref 1.7–7.7)
Neutrophils Relative %: 64 %
Platelets: 300 10*3/uL (ref 150–400)
RBC: 2.89 MIL/uL — ABNORMAL LOW (ref 3.87–5.11)
RDW: 14.6 % (ref 11.5–15.5)
Smear Review: ADEQUATE
WBC: 3.4 10*3/uL — ABNORMAL LOW (ref 4.0–10.5)
nRBC: 0 % (ref 0.0–0.2)

## 2020-10-02 LAB — COMPREHENSIVE METABOLIC PANEL
ALT: 12 U/L (ref 0–44)
AST: 29 U/L (ref 15–41)
Albumin: 3.8 g/dL (ref 3.5–5.0)
Alkaline Phosphatase: 55 U/L (ref 38–126)
Anion gap: 10 (ref 5–15)
BUN: 5 mg/dL — ABNORMAL LOW (ref 6–20)
CO2: 27 mmol/L (ref 22–32)
Calcium: 8.8 mg/dL — ABNORMAL LOW (ref 8.9–10.3)
Chloride: 86 mmol/L — ABNORMAL LOW (ref 98–111)
Creatinine, Ser: 0.47 mg/dL (ref 0.44–1.00)
GFR, Estimated: >60 mL/min (ref >60)
Glucose, Bld: 81 mg/dL (ref 70–99)
Potassium: 3.1 mmol/L — ABNORMAL LOW (ref 3.5–5.1)
Sodium: 123 mmol/L — ABNORMAL LOW (ref 135–145)
Total Bilirubin: 0.5 mg/dL (ref 0.3–1.2)
Total Protein: 6.7 g/dL (ref 6.5–8.1)

## 2020-10-02 MED ORDER — SODIUM CHLORIDE 0.9 % IV SOLN
Freq: Once | INTRAVENOUS | Status: AC
  Filled 2020-10-02: qty 250

## 2020-10-02 MED ORDER — POTASSIUM CHLORIDE 20 MEQ/100ML IV SOLN
20.0000 meq | Freq: Once | INTRAVENOUS | Status: AC
  Administered 2020-10-02: 20 meq via INTRAVENOUS

## 2020-10-02 MED ORDER — HEPARIN SOD (PORK) LOCK FLUSH 100 UNIT/ML IV SOLN
INTRAVENOUS | Status: AC
  Filled 2020-10-02: qty 5

## 2020-10-02 MED ORDER — HEPARIN SOD (PORK) LOCK FLUSH 100 UNIT/ML IV SOLN
500.0000 [IU] | Freq: Once | INTRAVENOUS | Status: AC | PRN
  Administered 2020-10-02: 500 [IU]
  Filled 2020-10-02: qty 5

## 2020-10-02 NOTE — Assessment & Plan Note (Addendum)
#  Left breast cancer T4 N2 M0-stage III triple negative; s/p Adriamycin Cytoxan every 3 weeks x4; US/mamo- March 25th- partial response noted [currently 4cm; improved LN]; however progression noted-see below.   # Currently- s/p carboplatin-Keytruda-Abraxane cycle #2; day-1; however unfortunately I have significant concerns for progressive disease/as evident by increasing inflammatory changes noted on the left breast.  I would recommend a CT scan chest and pelvis to rule out any distant disease. [Please see images above].  Discussed with the mother that this is very unfortunate/sign a poor prognosis.  Left a message for Dr. Peyton Najjar to discuss any surgical options.    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  20 IV KCl.  # schizophrenia: STABLE; Tritnity [phone]; Dr.Ahluwalia].   # Genetics: s/p genetic testing/counseling; awaiting results now.   # DISPOSITION:  #  HOLD chemo today;  Kcl 20 meq;  # CT C/A/P ASAP # as planned follow in 1 Week; please change to MD. # in 2 week -MD; labs- cbc/bmp; abraxane; possible 20 KCL- Dr.B -------------------------------------------------------------------------------------------------------------------------- Addendum: Discussed with Dr. Tawni Levy progression recommend stopping further chemotherapy; proceed with surgery.  We will cancel next week chemo; keep appointment MD; labs-possible KCl. GB

## 2020-10-02 NOTE — Patient Instructions (Signed)
[facility]  Discharge Instructions: Thank you for choosing [facility] to provide your oncology and hematology care.  If you have a lab appointment with the Cancer Center, please go directly to the Cancer Center and check in at the registration area.  Wear comfortable clothing and clothing appropriate for easy access to any Portacath or PICC line.   We strive to give you quality time with your provider. You may need to reschedule your appointment if you arrive late (15 or more minutes).  Arriving late affects you and other patients whose appointments are after yours.  Also, if you miss three or more appointments without notifying the office, you may be dismissed from the clinic at the provider's discretion.      For prescription refill requests, have your pharmacy contact our office and allow 72 hours for refills to be completed.    Today you received the following : Hydration / Potassium   To help prevent nausea and vomiting after your treatment, we encourage you to take your nausea medication as directed.  BELOW ARE SYMPTOMS THAT SHOULD BE REPORTED IMMEDIATELY: *FEVER GREATER THAN 100.4 F (38 C) OR HIGHER *CHILLS OR SWEATING *NAUSEA AND VOMITING THAT IS NOT CONTROLLED WITH YOUR NAUSEA MEDICATION *UNUSUAL SHORTNESS OF BREATH *UNUSUAL BRUISING OR BLEEDING *URINARY PROBLEMS (pain or burning when urinating, or frequent urination) *BOWEL PROBLEMS (unusual diarrhea, constipation, pain near the anus) TENDERNESS IN MOUTH AND THROAT WITH OR WITHOUT PRESENCE OF ULCERS (sore throat, sores in mouth, or a toothache) UNUSUAL RASH, SWELLING OR PAIN  UNUSUAL VAGINAL DISCHARGE OR ITCHING   Items with * indicate a potential emergency and should be followed up as soon as possible or go to the Emergency Department if any problems should occur.  Please show the CHEMOTHERAPY ALERT CARD or IMMUNOTHERAPY ALERT CARD at check-in to the Emergency Department and  triage nurse.  Should you have questions after your visit or need to cancel or reschedule your appointment, please contact [facility]  [phone] and follow the prompts.  Office hours are 8:00 a.m. to 4:30 p.m. Monday - Friday. Please note that voicemails left after 4:00 p.m. may not be returned until the following business day.  We are closed weekends and major holidays. You have access to a nurse at all times for urgent questions. Please call the main number to the clinic [phone] and follow the prompts.  For any non-urgent questions, you may also contact your provider using MyChart. We now offer e-Visits for anyone 18 and older to request care online for non-urgent symptoms. For details visit [email or web address].   Also download the MyChart app! Go to the app store, search "MyChart", open the app, select [facility], and log in with your MyChart username and password.  Due to Covid, a mask is required upon entering the hospital/clinic. If you do not have a mask, one will be given to you upon arrival. For doctor visits, patients may have 1 support person aged 18 or older with them. For treatment visits, patients cannot have anyone with them due to current Covid guidelines and our immunocompromised population.  

## 2020-10-02 NOTE — Progress Notes (Signed)
[place] CONSULT NOTE  Patient Care Team: Physicians, Heath as PCP - General Vladimir Crofts, MD as Consulting Physician (Neurology) Cammie Sickle, MD as Consulting Physician (Internal Medicine) Randel Pigg, MD as Consulting Physician (Psychiatry)  CHIEF COMPLAINTS/PURPOSE OF CONSULTATION: Breast cancer  #  Oncology History Overview Note  # NOV 2021- LEFT BREAST CANCER - [place]; TRIPLE NEGATIVE; Grade of invasive carcinoma: Grade 3; # LYMPH NODE, LEFT AXILLA; [Dr.Cintron]; ULTRASOUND-GUIDED CORE BIOPSY:  - METASTATIC MAMMARY CARCINOMA, AT LEAST 9 MM METASTASIS. ]; DEC 9th PET scan-Intensely hypermetabolic 13.2 cm left breast mass compatible with primary left breast malignancy, with left pectoralis muscle involvement posteriorly and cutaneous involvement by the mass. Multiple smaller hypermetabolic satellite malignant nodules scattered throughout the left breast. Bulky hypermetabolic left axillary and left retropectoral nodal metastases.   # 12/10- Carboplatin ONLY;  Taxol hypersensitivity reaction-discontinued  # 12/17- AC #1 q 3 W x4; MARCH 28th- CARBO-ABRAXANE-KEYTRUDA; #cycle 1-8 [abraxane dose reduced to 75/m2- sec to diarrhea-3]  # SURVIVORSHIP:   # GENETICS: p  DIAGNOSIS: Triple negative breast cancer  STAGE: Stage III       ;  GOALS: Cure  CURRENT/MOST RECENT THERAPY : AC    Carcinoma of overlapping sites of left breast in female, estrogen receptor negative ([place])  04/24/2020 Initial Diagnosis   Carcinoma of overlapping sites of left breast in female, estrogen receptor negative ([place])   05/04/2020 - 05/04/2020 Chemotherapy   The patient had dexamethasone (DECADRON) 4 MG tablet, 8 mg, Oral, Daily, 1 of 1 cycle, Start date: --, End date: -- palonosetron (ALOXI) injection 0.25 mg, 0.25 mg, Intravenous,  Once, 1 of 12 cycles Administration: 0.25 mg (05/04/2020) CARBOplatin (PARAPLATIN) 220 mg in sodium chloride 0.9 % 250 mL chemo infusion,  220 mg (100 % of original dose 222.4 mg), Intravenous,  Once, 1 of 12 cycles Dose modification:   (original dose 222.4 mg, Cycle 1) Administration: 220 mg (05/04/2020) PACLitaxel (TAXOL) 132 mg in sodium chloride 0.9 % 250 mL chemo infusion (</= 43m/m2), 80 mg/m2 = 132 mg, Intravenous,  Once, 1 of 12 cycles Administration: 132 mg (05/04/2020)  for chemotherapy treatment.    05/11/2020 - 07/31/2020 Chemotherapy         06/14/2020 Cancer Staging   Staging form: Breast, AJCC 8th Edition - Clinical: Stage IIIC (cT4, cN2b, cM0, G3, ER-, PR-, HER2-) - Signed by BCammie Sickle MD on 06/14/2020   08/20/2020 -  Chemotherapy    Patient is on Treatment Plan: BREAST PEMBROLIZUMAB + CARBOPLATIN D1 + ABRAXANEL D1,8,15 Q21D X 4 CYCLES        HISTORY OF PRESENTING ILLNESS: Patient is accompanied by her mother/patient is poor historian. [name]89y.o.  female with schizophrenia and left locally advanced triple negative breast cancer T4 N2; currently on neoadjuvant chemotherapy with carboplatin and Abraxane Keytruda.  Patient is currently status post cycle number 2 day 1 carbo-keytruda  Abraxane approximately 1 week ago.  Patient denies any diarrhea.  Denies any nausea vomiting.  Complains of mild discomfort/pain in the left breast.  No headaches.   Review of Systems  Unable to perform ROS: Psychiatric disorder     MEDICAL HISTORY:  Past Medical History:  Diagnosis Date  . Family history of breast cancer   . Family history of stomach cancer   . Family history of stomach cancer   . GERD (gastroesophageal reflux disease)   . IUD (intrauterine device) in place IUD was placed in 2016  . Leiomyoma  of uterus   . Schizophrenia ([place])   . Thyroid disease     SURGICAL HISTORY: Past Surgical History:  Procedure Laterality Date  . BREAST BIOPSY Left 03/2020   [facility]- Triple neg- Stage 3  . PORTACATH PLACEMENT Right 05/02/2020   Procedure: INSERTION PORT-A-CATH;  Surgeon:  Herbert Pun, MD;  Location: [facility] ORS;  Service: General;  Laterality: Right;    SOCIAL HISTORY: Social History   Socioeconomic History  . Marital status: Single    Spouse name: Not on file  . Number of children: Not on file  . Years of education: Not on file  . Highest education level: Not on file  Occupational History  . Not on file  Tobacco Use  . Smoking status: Never Smoker  . Smokeless tobacco: Never Used  Substance and Sexual Activity  . Alcohol use: No  . Drug use: No  . Sexual activity: Yes    Birth control/protection: Surgical  Other Topics Concern  . Not on file  Social History Narrative  . Not on file   Social Determinants of Health   Financial Resource Strain: Not on file  Food Insecurity: Not on file  Transportation Needs: Not on file  Physical Activity: Not on file  Stress: Not on file  Social Connections: Not on file  Intimate Partner Violence: Not on file    FAMILY HISTORY: Family History  Problem Relation Age of Onset  . Breast cancer Mother   . Stomach cancer Maternal Grandfather   . Other Maternal Aunt        died from tumor, brain?    ALLERGIES:  is allergic to paclitaxel.  MEDICATIONS:  Current Outpatient Medications  Medication Sig Dispense Refill  . acetaminophen (TYLENOL) 650 MG CR tablet Take 650 mg by mouth every 8 (eight) hours as needed for pain.    Marland Kitchen BANOPHEN 25 MG capsule Take 25 mg by mouth every 6 (six) hours as needed for itching.    . benztropine (COGENTIN) 0.5 MG tablet Take 1 tablet (0.5 mg total) by mouth 2 (two) times daily. 60 tablet 1  . cetirizine (ZYRTEC) 10 MG tablet Take 1 tablet (10 mg total) by mouth daily as needed for allergies.    . clonazePAM (KLONOPIN) 0.5 MG tablet Take 0.5 mg by mouth at bedtime.    . divalproex (DEPAKOTE) 500 MG DR tablet Take 1 tablet (500 mg total) by mouth every 12 (twelve) hours. 60 tablet 1  . fluticasone (FLONASE) 50 MCG/ACT nasal spray Place 1 spray into both nostrils  daily. 11.1 mL 9  . haloperidol (HALDOL) 5 MG tablet Take 1 tablet (5 mg total) by mouth 3 (three) times daily. 90 tablet 1  . haloperidol decanoate (HALDOL DECANOATE) 100 MG/ML injection Inject 100 mg into the muscle every 28 (twenty-eight) days.    . hydrOXYzine (ATARAX/VISTARIL) 25 MG tablet Take 1 tablet (25 mg total) by mouth every 8 (eight) hours as needed. 60 tablet 1  . levothyroxine (SYNTHROID) 88 MCG tablet Take 1 tablet (88 mcg total) by mouth daily at 6 (six) AM. 30 tablet 1  . lidocaine-prilocaine (EMLA) cream Apply 1 application topically as needed. 30 g 3  . medroxyPROGESTERone (DEPO-PROVERA) 150 MG/ML injection Inject into the muscle.    Marland Kitchen MELATONIN PO Take 1 tablet by mouth at bedtime as needed (sleep).    Marland Kitchen olopatadine (PATANOL) 0.1 % ophthalmic solution Place 1 drop into both eyes 2 (two) times daily. 10 mL 1  . ondansetron (ZOFRAN) 8 MG tablet One  pill every 8 hours as needed for nausea/vomitting. 40 tablet 1  . potassium chloride SA (KLOR-CON M20) 20 MEQ tablet Take 1 tablet (20 mEq total) by mouth 2 (two) times daily. 60 tablet 0  . prochlorperazine (COMPAZINE) 10 MG tablet Take 1 tablet (10 mg total) by mouth every 6 (six) hours as needed for nausea or vomiting. 40 tablet 1  . sodium chloride 1 g tablet Take 1 tablet (1 g total) by mouth 3 (three) times daily. 90 tablet 0  . diphenoxylate-atropine (LOMOTIL) 2.5-0.025 MG tablet Take 1 tablet by mouth 4 (four) times daily as needed for diarrhea or loose stools. Take it along with immodium (Patient not taking: Reported on 10/02/2020) 45 tablet 0   No current facility-administered medications for this visit.   Facility-Administered Medications Ordered in Other Visits  Medication Dose Route Frequency Provider Last Rate Last Admin  . sodium chloride flush (NS) 0.9 % injection 10 mL  10 mL Intravenous PRN Cammie Sickle, MD   10 mL at 06/14/20 0840      .  PHYSICAL EXAMINATION: ECOG PERFORMANCE STATUS: 1 - Symptomatic  but completely ambulatory  Vitals:   10/02/20 0912  BP: 122/75  Pulse: 95  Resp: 20  Temp: 97.7 F (36.5 C)   Filed Weights   10/02/20 0912  Weight: 147 lb (66.7 kg)    Physical Exam Constitutional:      Comments: Accompanied by sister.  Ambulating independently.  HENT:     Head: Normocephalic and atraumatic.     Mouth/Throat:     Pharynx: No oropharyngeal exudate.  Eyes:     Pupils: Pupils are equal, round, and reactive to light.  Cardiovascular:     Rate and Rhythm: Normal rate and regular rhythm.  Pulmonary:     Effort: No respiratory distress.     Breath sounds: No wheezing.  Abdominal:     General: Bowel sounds are normal. There is no distension.     Palpations: Abdomen is soft. There is no mass.     Tenderness: There is no abdominal tenderness. There is no guarding or rebound.  Musculoskeletal:        General: No tenderness. Normal range of motion.     Cervical back: Normal range of motion and neck supple.  Skin:    General: Skin is warm.     Comments: Large left-sided breast ~ 5 x 4cm (overall improved); however noted to have worsening edema/skin changes concerning for progression while on chemotherapy.  Neurological:     Mental Status: She is alert and oriented to person, place, and time.  Psychiatric:        Mood and Affect: Affect normal.     Comments: Poor insight/tangential thoughts.  Unable to make a coherent conversation-underlying schizophrenia.      LABORATORY DATA:  I have reviewed the data as listed Lab Results  Component Value Date   WBC 3.4 (L) 10/02/2020   HGB 9.5 (L) 10/02/2020   HCT 26.6 (L) 10/02/2020   MCV 92.0 10/02/2020   PLT 300 10/02/2020   Recent Labs    09/11/20 0817 09/18/20 0827 09/26/20 0754 10/02/20 0850  NA 132* 131* 128* 123*  K 3.2* 3.6 3.6 3.1*  CL 97* 96* 92* 86*  CO2 24 23 25 27   GLUCOSE 156* 91 87 81  BUN <5* <5* <5* 5*  CREATININE 0.54 0.51 0.51 0.47  CALCIUM 9.4 9.4 9.3 8.8*  GFRNONAA >60 >60 >60 >60   PROT 7.5  --  7.2 6.7  ALBUMIN 3.8  --  3.7 3.8  AST 26  --  20 29  ALT 11  --  10 12  ALKPHOS 57  --  55 55  BILITOT 0.3  --  0.4 0.5    RADIOGRAPHIC STUDIES: I have personally reviewed the radiological images as listed and agreed with the findings in the report. No results found.    06/04/2020-     07/30/2020-    On 4/26-    09/26/2020    10/02/2020     ASSESSMENT & PLAN:   Carcinoma of overlapping sites of left breast in female, estrogen receptor negative ([place]) #Left breast cancer T4 N2 M0-stage III triple negative; s/p Adriamycin Cytoxan every 3 weeks x4; US/mamo- March 25th- partial response noted [currently 4cm; improved LN]; however progression noted-see below.   # Currently- s/p carboplatin-Keytruda-Abraxane cycle #2; day-1; however unfortunately I have significant concerns for progressive disease/as evident by increasing inflammatory changes noted on the left breast.  I would recommend a CT scan chest and pelvis to rule out any distant disease. [Please see images above].  Discussed with the mother that this is very unfortunate/sign a poor prognosis.  Left a message for Dr. Peyton Najjar to discuss any surgical options.    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  20 IV KCl.  # schizophrenia: STABLE; Tritnity [phone]; Dr.Ahluwalia].   # Genetics: s/p genetic testing/counseling; awaiting results now.   # DISPOSITION:  #  HOLD chemo today;  Kcl 20 meq;  # CT C/A/P ASAP # as planned follow in 1 Week; please change to MD. # in 2 week -MD; labs- cbc/bmp; abraxane; possible 20 KCL- Dr.B -------------------------------------------------------------------------------------------------------------------------- Addendum: Discussed with Dr. Tawni Levy progression recommend stopping further  chemotherapy; proceed with surgery.  We will cancel next week chemo; keep appointment MD; labs-possible KCl. GB    All questions were answered. The patient knows to call the clinic with any problems, questions or concerns.    Cammie Sickle, MD 10/02/2020 4:43 PM

## 2020-10-04 ENCOUNTER — Ambulatory Visit: Admission: RE | Admit: 2020-10-04 | Payer: Medicaid Other | Source: Ambulatory Visit

## 2020-10-08 ENCOUNTER — Other Ambulatory Visit: Payer: Self-pay

## 2020-10-08 ENCOUNTER — Ambulatory Visit: Payer: Medicaid Other | Admitting: Advanced Practice Midwife

## 2020-10-08 ENCOUNTER — Encounter: Payer: Self-pay | Admitting: Advanced Practice Midwife

## 2020-10-08 ENCOUNTER — Other Ambulatory Visit: Payer: Self-pay | Admitting: Internal Medicine

## 2020-10-08 DIAGNOSIS — Z113 Encounter for screening for infections with a predominantly sexual mode of transmission: Secondary | ICD-10-CM

## 2020-10-08 DIAGNOSIS — E663 Overweight: Secondary | ICD-10-CM | POA: Insufficient documentation

## 2020-10-08 LAB — WET PREP FOR TRICH, YEAST, CLUE
Trichomonas Exam: NEGATIVE
Yeast Exam: NEGATIVE

## 2020-10-08 NOTE — Progress Notes (Signed)
Pt here for STD screening.  Wet mount results reviewed.  No treatment required per SO.  Condoms given to pt. Windle Guard, RN

## 2020-10-08 NOTE — Progress Notes (Signed)
  [facility] Department STI clinic/screening visit  Subjective:  Tamara [name] is a 50 y.o. SBF nullip nonsmoker female being seen today here with her mom for an STI screening visit. The patient reports they do not have symptoms.  Patient reports that they do not desire a pregnancy in the next year.   They reported they are not interested in discussing contraception today.  No LMP recorded. Patient has had an injection.   Patient has the following medical conditions:   Patient Active Problem List   Diagnosis Date Noted  . Overweight BMI=27.7 10/08/2020  . Family history of breast cancer   . Family history of stomach cancer   . Carcinoma of overlapping sites of left breast in female, estrogen receptor negative ([place]) 04/24/2020  . Goals of care, counseling/discussion 04/24/2020  . Breast mass in female   . Palliative care encounter   . Large mass of left breast 04/03/2020  . Schizophrenia ([place]) 06/06/2019  . 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 GERD (gastroesophageal reflux disease) 09/11/2015  . Seborrheic dermatitis 09/11/2015  . 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 Schizophrenia, paranoid type ([place]) 09/10/2015  . Congenital hypothyroidism without goiter 02/13/2015    Chief Complaint  Patient presents with  . SEXUALLY TRANSMITTED DISEASE    Hepatitis screening.  PT    HPI  Patient reports having sex with partner recently dx'd with Hep C. Last sex last night with condom; with current partner x 1 mo; 1 partner in last 3 mo. LMP not on DMPA. Denies cigs, vaping, MJ, ETOH.   Last HIV test per patient/review of record was 04/03/20 Patient reports last pap was 12/13/19 neg under general anesthesia  See flowsheet for further details and programmatic requirements.    The following portions of the patient's history were reviewed and updated as appropriate: allergies, current medications, past medical history, past social history, past surgical history and problem  list.  Objective:  There were no vitals filed for this visit.  Pt refuses exam today and prefers to self collect    Assessment and Plan:  Tamara [name] is a 50 y.o. female presenting to the [facility] Department for STI screening  1. Overweight BMI=27.7  2. Screening examination for venereal disease Treat wet mount per standing orders Immunization nurse consult - WET PREP FOR [place], YEAST, CLUE - Chlamydia/Gonorrhea [place] [facility] - Syphilis Serology, [place] [facility] - HIV/HCV [place] [facility] - HBV Antigen/Antibody [place] [facility]     Return if symptoms worsen or fail to improve.  Future Appointments  Date Time Provider [place]  10/10/2020  8:00 AM [facility]-PORT FLUSH [facility] None  10/10/2020  8:30 AM Earlie Server, MD [facility] None  10/10/2020  9:00 AM [facility]- [place] INFUSION CHAIR 13 [facility] None  10/16/2020  8:30 AM [facility]-PORT FLUSH [facility] None  10/16/2020  9:00 AM Cammie Sickle, MD [facility] None  10/16/2020  9:30 AM [facility]- MO INFUSION CHAIR 20 [facility] None  11/20/2020 10:30 AM Borders, Kirt Boys, NP [facility] None    Herbie Saxon, CNM

## 2020-10-09 ENCOUNTER — Telehealth: Payer: Self-pay | Admitting: *Deleted

## 2020-10-09 NOTE — Telephone Encounter (Signed)
[name] sister cancel the ct scan apt and did not rescheduled. RN reached out to the pt's sister and discussed the importance of keeping the ct scan apts. Sister gave verbal understanding of the purpose of the scans and patient's current health condition.  Pt's sister stated that patient refused the oral contrast and she refused to go in fear that she would get "sick and vomit" from the oral contrast. Family is requesting the scan to be with IV contrast only.  RN communicated this with Dr. Peyton Najjar and Dr. Tasia Catchings. Dr. Peyton Najjar was waiting on the ct scan results to determine if pt was a surgical candidate. Dr. Peyton Najjar will have his office reach out to patient's family to schedule apt s/pt ct scan. Pt will keep apts with Dr. Tasia Catchings tomorrow as scheduled.

## 2020-10-10 ENCOUNTER — Other Ambulatory Visit: Payer: Self-pay

## 2020-10-10 ENCOUNTER — Inpatient Hospital Stay: Payer: Medicaid Other

## 2020-10-10 ENCOUNTER — Encounter: Payer: Self-pay | Admitting: Oncology

## 2020-10-10 ENCOUNTER — Inpatient Hospital Stay (HOSPITAL_BASED_OUTPATIENT_CLINIC_OR_DEPARTMENT_OTHER): Payer: Medicaid Other | Admitting: Oncology

## 2020-10-10 ENCOUNTER — Inpatient Hospital Stay: Payer: Medicaid Other | Attending: Internal Medicine

## 2020-10-10 VITALS — BP 108/63 | HR 83 | Temp 99.2°F | Resp 20 | Ht 61.0 in | Wt 150.0 lb

## 2020-10-10 DIAGNOSIS — Z803 Family history of malignant neoplasm of breast: Secondary | ICD-10-CM | POA: Diagnosis not present

## 2020-10-10 DIAGNOSIS — Z171 Estrogen receptor negative status [ER-]: Secondary | ICD-10-CM | POA: Insufficient documentation

## 2020-10-10 DIAGNOSIS — E871 Hypo-osmolality and hyponatremia: Secondary | ICD-10-CM

## 2020-10-10 DIAGNOSIS — Z79899 Other long term (current) drug therapy: Secondary | ICD-10-CM | POA: Insufficient documentation

## 2020-10-10 DIAGNOSIS — C50812 Malignant neoplasm of overlapping sites of left female breast: Secondary | ICD-10-CM | POA: Insufficient documentation

## 2020-10-10 DIAGNOSIS — Z800 Family history of malignant neoplasm of digestive organs: Secondary | ICD-10-CM | POA: Insufficient documentation

## 2020-10-10 DIAGNOSIS — F209 Schizophrenia, unspecified: Secondary | ICD-10-CM | POA: Insufficient documentation

## 2020-10-10 DIAGNOSIS — E876 Hypokalemia: Secondary | ICD-10-CM

## 2020-10-10 LAB — CBC WITH DIFFERENTIAL/PLATELET
Abs Immature Granulocytes: 0.02 10*3/uL (ref 0.00–0.07)
Basophils Absolute: 0 10*3/uL (ref 0.0–0.1)
Basophils Relative: 0 %
Eosinophils Absolute: 0 10*3/uL (ref 0.0–0.5)
Eosinophils Relative: 0 %
HCT: 27.1 % — ABNORMAL LOW (ref 36.0–46.0)
Hemoglobin: 9.7 g/dL — ABNORMAL LOW (ref 12.0–15.0)
Immature Granulocytes: 1 %
Lymphocytes Relative: 21 %
Lymphs Abs: 0.8 10*3/uL (ref 0.7–4.0)
MCH: 34 pg (ref 26.0–34.0)
MCHC: 35.8 g/dL (ref 30.0–36.0)
MCV: 95.1 fL (ref 80.0–100.0)
Monocytes Absolute: 1.3 10*3/uL — ABNORMAL HIGH (ref 0.1–1.0)
Monocytes Relative: 34 %
Neutro Abs: 1.7 10*3/uL (ref 1.7–7.7)
Neutrophils Relative %: 44 %
Platelets: 319 10*3/uL (ref 150–400)
RBC: 2.85 MIL/uL — ABNORMAL LOW (ref 3.87–5.11)
RDW: 15.2 % (ref 11.5–15.5)
Smear Review: NORMAL
WBC: 3.7 10*3/uL — ABNORMAL LOW (ref 4.0–10.5)
nRBC: 0 % (ref 0.0–0.2)

## 2020-10-10 LAB — BASIC METABOLIC PANEL
Anion gap: 10 (ref 5–15)
BUN: <5 mg/dL — ABNORMAL LOW (ref 6–20)
CO2: 24 mmol/L (ref 22–32)
Calcium: 9.3 mg/dL (ref 8.9–10.3)
Chloride: 92 mmol/L — ABNORMAL LOW (ref 98–111)
Creatinine, Ser: 0.57 mg/dL (ref 0.44–1.00)
GFR, Estimated: >60 mL/min (ref >60)
Glucose, Bld: 94 mg/dL (ref 70–99)
Potassium: 3.6 mmol/L (ref 3.5–5.1)
Sodium: 126 mmol/L — ABNORMAL LOW (ref 135–145)

## 2020-10-10 MED ORDER — SODIUM CHLORIDE 0.9 % IV SOLN
Freq: Once | INTRAVENOUS | Status: AC
  Filled 2020-10-10: qty 250

## 2020-10-10 MED ORDER — HEPARIN SOD (PORK) LOCK FLUSH 100 UNIT/ML IV SOLN
500.0000 [IU] | Freq: Once | INTRAVENOUS | Status: AC | PRN
  Administered 2020-10-10: 500 [IU]
  Filled 2020-10-10: qty 5

## 2020-10-10 MED ORDER — HEPARIN SOD (PORK) LOCK FLUSH 100 UNIT/ML IV SOLN
INTRAVENOUS | Status: AC
  Filled 2020-10-10: qty 5

## 2020-10-10 NOTE — Patient Instructions (Signed)
[facility] [place] [facility]  Discharge Instructions: Thank you for choosing [facility] to provide your oncology and hematology care.  If you have a lab appointment with the Cancer Center, please go directly to the Cancer Center and check in at the registration area.  Wear comfortable clothing and clothing appropriate for easy access to any Portacath or PICC line.   We strive to give you quality time with your provider. You may need to reschedule your appointment if you arrive late (15 or more minutes).  Arriving late affects you and other patients whose appointments are after yours.  Also, if you miss three or more appointments without notifying the office, you may be dismissed from the clinic at the provider's discretion.      For prescription refill requests, have your pharmacy contact our office and allow 72 hours for refills to be completed.    Today you received IV fluids   To help prevent nausea and vomiting after your treatment, we encourage you to take your nausea medication as directed.  BELOW ARE SYMPTOMS THAT SHOULD BE REPORTED IMMEDIATELY: *FEVER GREATER THAN 100.4 F (38 C) OR HIGHER *CHILLS OR SWEATING *NAUSEA AND VOMITING THAT IS NOT CONTROLLED WITH YOUR NAUSEA MEDICATION *UNUSUAL SHORTNESS OF BREATH *UNUSUAL BRUISING OR BLEEDING *URINARY PROBLEMS (pain or burning when urinating, or frequent urination) *BOWEL PROBLEMS (unusual diarrhea, constipation, pain near the anus) TENDERNESS IN MOUTH AND THROAT WITH OR WITHOUT PRESENCE OF ULCERS (sore throat, sores in mouth, or a toothache) UNUSUAL RASH, SWELLING OR PAIN  UNUSUAL VAGINAL DISCHARGE OR ITCHING   Items with * indicate a potential emergency and should be followed up as soon as possible or go to the Emergency Department if any problems should occur.  Please show the CHEMOTHERAPY ALERT CARD or IMMUNOTHERAPY ALERT CARD at check-in to the Emergency Department and triage nurse.  Should you  have questions after your visit or need to cancel or reschedule your appointment, please contact [facility] [place] [facility]  [phone] and follow the prompts.  Office hours are 8:00 a.m. to 4:30 p.m. Monday - Friday. Please note that voicemails left after 4:00 p.m. may not be returned until the following business day.  We are closed weekends and major holidays. You have access to a nurse at all times for urgent questions. Please call the main number to the clinic [phone] and follow the prompts.  For any non-urgent questions, you may also contact your provider using MyChart. We now offer e-Visits for anyone 18 and older to request care online for non-urgent symptoms. For details visit [email or web address].   Also download the MyChart app! Go to the app store, search "MyChart", open the app, select [facility], and log in with your MyChart username and password.  Due to Covid, a mask is required upon entering the hospital/clinic. If you do not have a mask, one will be given to you upon arrival. For doctor visits, patients may have 1 support person aged 18 or older with them. For treatment visits, patients cannot have anyone with them due to current Covid guidelines and our immunocompromised population.  

## 2020-10-10 NOTE — Progress Notes (Signed)
Patient tolerated infusion well today, 1 L NS given, no concerns voiced. Patient was discharged with mom. Stable.

## 2020-10-10 NOTE — Progress Notes (Signed)
[place] CONSULT NOTE  Patient Care Team: Physicians, [place] as PCP - General Vladimir Crofts, MD as Consulting Physician (Neurology) Cammie Sickle, MD as Consulting Physician (Internal Medicine) Randel Pigg, MD as Consulting Physician (Psychiatry) Herbert Pun, MD as Consulting Physician (General Surgery)  CHIEF COMPLAINTS: Breast cancer  Oncology History Overview Note  # NOV 2021- LEFT BREAST CANCER - [place]; TRIPLE NEGATIVE; Grade of invasive carcinoma: Grade 3; # LYMPH NODE, LEFT AXILLA; [Dr.Cintron]; ULTRASOUND-GUIDED CORE BIOPSY:  - METASTATIC MAMMARY CARCINOMA, AT LEAST 9 MM METASTASIS. ]; DEC 9th PET scan-Intensely hypermetabolic 41.6 cm left breast mass compatible with primary left breast malignancy, with left pectoralis muscle involvement posteriorly and cutaneous involvement by the mass. Multiple smaller hypermetabolic satellite malignant nodules scattered throughout the left breast. Bulky hypermetabolic left axillary and left retropectoral nodal metastases.   # 12/10- Carboplatin ONLY;  Taxol hypersensitivity reaction-discontinued  # 12/17- AC #1 q 3 W x4; MARCH 28th- CARBO-ABRAXANE-KEYTRUDA; #cycle 1-8 [abraxane dose reduced to 75/m2- sec to diarrhea-3]  # SURVIVORSHIP:   # GENETICS: p  DIAGNOSIS: Triple negative breast cancer  STAGE: Stage III       ;  GOALS: Cure  CURRENT/MOST RECENT THERAPY : AC    Carcinoma of overlapping sites of left breast in female, estrogen receptor negative ([place])  04/24/2020 Initial Diagnosis   Carcinoma of overlapping sites of left breast in female, estrogen receptor negative ([place])   05/04/2020 - 05/04/2020 Chemotherapy   The patient had dexamethasone (DECADRON) 4 MG tablet, 8 mg, Oral, Daily, 1 of 1 cycle, Start date: --, End date: -- palonosetron (ALOXI) injection 0.25 mg, 0.25 mg, Intravenous,  Once, 1 of 12 cycles Administration: 0.25 mg (05/04/2020) CARBOplatin (PARAPLATIN) 220 mg in  sodium chloride 0.9 % 250 mL chemo infusion, 220 mg (100 % of original dose 222.4 mg), Intravenous,  Once, 1 of 12 cycles Dose modification:   (original dose 222.4 mg, Cycle 1) Administration: 220 mg (05/04/2020) PACLitaxel (TAXOL) 132 mg in sodium chloride 0.9 % 250 mL chemo infusion (</= 37m/m2), 80 mg/m2 = 132 mg, Intravenous,  Once, 1 of 12 cycles Administration: 132 mg (05/04/2020)  for chemotherapy treatment.    05/11/2020 - 07/31/2020 Chemotherapy         06/14/2020 Cancer Staging   Staging form: Breast, AJCC 8th Edition - Clinical: Stage IIIC (cT4, cN2b, cM0, G3, ER-, PR-, HER2-) - Signed by BCammie Sickle MD on 06/14/2020   08/20/2020 -  Chemotherapy    Patient is on Treatment Plan: BREAST PEMBROLIZUMAB + CARBOPLATIN D1 + ABRAXANEL D1,8,15 Q21D X 4 CYCLES        HISTORY OF PRESENTING ILLNESS: Patient is accompanied by her mother/patient is poor historian. Tamara [id number].  female with schizophrenia and left locally advanced triple negative breast cancer T4 N2; currently on neoadjuvant chemotherapy with carboplatin and Abraxane Keytruda.  Presents to recheck blood work and discussed mSales promotion account executive Patient follows up with Dr.Brahmanday who is off today, I am covering him to see this patient. Medical records was reviewed by me. Patient has mental disorders.  She was accompanied by her mother who is the power of attorney. Patient denies any pain.    Review of Systems  Unable to perform ROS: Psychiatric disorder     MEDICAL HISTORY:  Past Medical History:  Diagnosis Date  . Family history of breast cancer   . Family history of stomach cancer   . Family history of stomach cancer   . GERD (  gastroesophageal reflux disease)   . IUD (intrauterine device) in place IUD was placed in 2016  . Leiomyoma of uterus   . Schizophrenia ([place])   . Thyroid disease     SURGICAL HISTORY: Past Surgical History:  Procedure Laterality Date  . BREAST BIOPSY Left 03/2020    [facility]- Triple neg- Stage 3  . PORTACATH PLACEMENT Right 05/02/2020   Procedure: INSERTION PORT-A-CATH;  Surgeon: Herbert Pun, MD;  Location: [facility] ORS;  Service: General;  Laterality: Right;    SOCIAL HISTORY: Social History   Socioeconomic History  . Marital status: Single    Spouse name: Not on file  . Number of children: Not on file  . Years of education: Not on file  . Highest education level: Not on file  Occupational History  . Not on file  Tobacco Use  . Smoking status: Never Smoker  . Smokeless tobacco: Never Used  Substance and Sexual Activity  . Alcohol use: No  . Drug use: No  . Sexual activity: Yes    Partners: Male    Birth control/protection: Surgical, Injection  Other Topics Concern  . Not on file  Social History Narrative  . Not on file   Social Determinants of Health   Financial Resource Strain: Not on file  Food Insecurity: Not on file  Transportation Needs: Not on file  Physical Activity: Not on file  Stress: Not on file  Social Connections: Not on file  Intimate Partner Violence: Not on file    FAMILY HISTORY: Family History  Problem Relation Age of Onset  . Breast cancer Mother   . Stomach cancer Maternal Grandfather   . Other Maternal Aunt        died from tumor, brain?    ALLERGIES:  is allergic to paclitaxel.  MEDICATIONS:  Current Outpatient Medications  Medication Sig Dispense Refill  . acetaminophen (TYLENOL) 650 MG CR tablet Take 650 mg by mouth every 8 (eight) hours as needed for pain.    Marland Kitchen BANOPHEN 25 MG capsule Take 25 mg by mouth every 6 (six) hours as needed for itching.    . benztropine (COGENTIN) 0.5 MG tablet Take 1 tablet (0.5 mg total) by mouth 2 (two) times daily. 60 tablet 1  . cetirizine (ZYRTEC) 10 MG tablet Take 1 tablet (10 mg total) by mouth daily as needed for allergies.    . clonazePAM (KLONOPIN) 0.5 MG tablet Take 0.5 mg by mouth at bedtime.    . diphenoxylate-atropine (LOMOTIL) 2.5-0.025 MG tablet Take  1 tablet by mouth 4 (four) times daily as needed for diarrhea or loose stools. Take it along with immodium (Patient not taking: Reported on 10/02/2020) 45 tablet 0  . divalproex (DEPAKOTE) 500 MG DR tablet Take 1 tablet (500 mg total) by mouth every 12 (twelve) hours. 60 tablet 1  . fluticasone (FLONASE) 50 MCG/ACT nasal spray Place 1 spray into both nostrils daily. 11.1 mL 9  . haloperidol (HALDOL) 5 MG tablet Take 1 tablet (5 mg total) by mouth 3 (three) times daily. 90 tablet 1  . haloperidol decanoate (HALDOL DECANOATE) 100 MG/ML injection Inject 100 mg into the muscle every 28 (twenty-eight) days.    . hydrOXYzine (ATARAX/VISTARIL) 25 MG tablet TAKE 1 TABLET BY MOUTH EVERY 8 HOURS AS NEEDED. 60 tablet 1  . levothyroxine (SYNTHROID) 88 MCG tablet Take 1 tablet (88 mcg total) by mouth daily at 6 (six) AM. 30 tablet 1  . lidocaine-prilocaine (EMLA) cream Apply 1 application topically as needed. 30 g 3  .  medroxyPROGESTERone (DEPO-PROVERA) 150 MG/ML injection Inject into the muscle.    Marland Kitchen MELATONIN PO Take 1 tablet by mouth at bedtime as needed (sleep).    Marland Kitchen olopatadine (PATANOL) 0.1 % ophthalmic solution Place 1 drop into both eyes 2 (two) times daily. 10 mL 1  . ondansetron (ZOFRAN) 8 MG tablet One pill every 8 hours as needed for nausea/vomitting. 40 tablet 1  . potassium chloride SA (KLOR-CON M20) 20 MEQ tablet Take 1 tablet (20 mEq total) by mouth 2 (two) times daily. 60 tablet 0  . prochlorperazine (COMPAZINE) 10 MG tablet Take 1 tablet (10 mg total) by mouth every 6 (six) hours as needed for nausea or vomiting. 40 tablet 1  . sodium chloride 1 g tablet Take 1 tablet (1 g total) by mouth 3 (three) times daily. 90 tablet 0   No current facility-administered medications for this visit.   Facility-Administered Medications Ordered in Other Visits  Medication Dose Route Frequency Provider Last Rate Last Admin  . sodium chloride flush (NS) 0.9 % injection 10 mL  10 mL Intravenous PRN Charlaine Dalton R, MD   10 mL at 06/14/20 0840    PHYSICAL EXAMINATION: ECOG PERFORMANCE STATUS: 1 - Symptomatic but completely ambulatory  Vitals:   10/10/20 0835  BP: 108/63  Pulse: 83  Resp: 20  Temp: 99.2 F (37.3 C)   Filed Weights   10/10/20 0835  Weight: 150 lb (68 kg)    Physical Exam Constitutional:      Comments: Accompanied by sister.  Ambulating independently.  HENT:     Head: Normocephalic and atraumatic.     Mouth/Throat:     Pharynx: No oropharyngeal exudate.  Eyes:     Pupils: Pupils are equal, round, and reactive to light.  Cardiovascular:     Rate and Rhythm: Normal rate and regular rhythm.  Pulmonary:     Effort: No respiratory distress.     Breath sounds: No wheezing.  Abdominal:     General: Bowel sounds are normal. There is no distension.     Palpations: Abdomen is soft. There is no mass.     Tenderness: There is no abdominal tenderness. There is no guarding or rebound.  Musculoskeletal:        General: No tenderness. Normal range of motion.     Cervical back: Normal range of motion and neck supple.  Skin:    General: Skin is warm.     Comments: Large left-sided breast ~5-6 cm  No palpable lymphadenopathy.  Neurological:     Mental Status: She is alert and oriented to person, place, and time.  Psychiatric:        Mood and Affect: Affect normal.     Comments: Poor insight/tangential thoughts.  Unable to make a coherent conversation-underlying schizophrenia.      LABORATORY DATA:  I have reviewed the data as listed Lab Results  Component Value Date   WBC 3.4 (L) 10/02/2020   HGB 9.5 (L) 10/02/2020   HCT 26.6 (L) 10/02/2020   MCV 92.0 10/02/2020   PLT 300 10/02/2020   Recent Labs    09/11/20 0817 09/18/20 0827 09/26/20 0754 10/02/20 0850  NA 132* 131* 128* 123*  K 3.2* 3.6 3.6 3.1*  CL 97* 96* 92* 86*  CO2 24 23 25 27   GLUCOSE 156* 91 87 81  BUN <5* <5* <5* 5*  CREATININE 0.54 0.51 0.51 0.47  CALCIUM 9.4 9.4 9.3 8.8*  GFRNONAA  >60 >60 >60 >60  PROT 7.5  --  7.2 6.7  ALBUMIN 3.8  --  3.7 3.8  AST 26  --  20 29  ALT 11  --  10 12  ALKPHOS 57  --  55 55  BILITOT 0.3  --  0.4 0.5    RADIOGRAPHIC STUDIES: I have personally reviewed the radiological images as listed and agreed with the findings in the report. No results found.    06/04/2020-     07/30/2020-    On 4/26-    09/26/2020    10/10/2020      ASSESSMENT & PLAN:  1. Carcinoma of overlapping sites of left breast in female, estrogen receptor negative ([place])   2. Hypokalemia   3. Hyponatremia   4. Family history of breast cancer    #Left breast cancer T4 N2 M0-stage III triple negative; s/p Adriamycin Cytoxan every 3 weeks x4; US/mamo- March 25th- partial response noted [currently 4cm; improved LN]  #Given the fair tolerance to above chemotherapy, left breast mass appears to be growing despite chemo, Dr. Rogue Bussing has discussed with Dr. Peyton Najjar.  Awaiting CT chest abdomen pelvis with contrast scan and possible proceed with surgery.  #Electrolytes :chronic hyponatremia-likely secondary to SIDH/psychiatric medication; sodium today 1 26.  IV normal saline 1 L, recommend patient to liberate salt.  Potassium 3.6 recommend continue 20 mg of potassium at home;  no need for IV potassium chloride today.Marland Kitchen  # schizophrenia: STABLE; Tritnity [phone]; Dr.Ahluwalia].   # Genetics: Discussed regarding genetic testing/counseling.  Given to plan to breast cancer/mother having breast cancer-high suspicion of hereditary cancer syndrome.    Dr. B plans to refer to genetic counselor. All questions were answered. The patient knows to call the clinic with any problems, questions or concerns. Follow-up plan to be determined.  Depending on CT scan results.   Earlie Server, MD 10/10/2020 8:24 AM

## 2020-10-12 LAB — HEPATITIS B SURFACE ANTIGEN: Hepatitis B Surface Ag: NONREACTIVE

## 2020-10-12 LAB — HM HEPATITIS C SCREENING LAB: HM Hepatitis Screen: NEGATIVE

## 2020-10-12 LAB — HM HIV SCREENING LAB: HM HIV Screening: NEGATIVE

## 2020-10-15 ENCOUNTER — Other Ambulatory Visit: Payer: Self-pay

## 2020-10-15 ENCOUNTER — Ambulatory Visit
Admission: RE | Admit: 2020-10-15 | Discharge: 2020-10-15 | Disposition: A | Discharge status: Home / Self Care | Payer: Medicaid Other | Source: Ambulatory Visit | Attending: Internal Medicine | Admitting: Internal Medicine

## 2020-10-15 DIAGNOSIS — C50812 Malignant neoplasm of overlapping sites of left female breast: Secondary | ICD-10-CM | POA: Insufficient documentation

## 2020-10-15 DIAGNOSIS — Z171 Estrogen receptor negative status [ER-]: Secondary | ICD-10-CM | POA: Insufficient documentation

## 2020-10-15 MED ORDER — IOHEXOL 300 MG/ML  SOLN
100.0000 mL | Freq: Once | INTRAMUSCULAR | Status: AC | PRN
  Administered 2020-10-15: 100 mL via INTRAVENOUS

## 2020-10-16 ENCOUNTER — Inpatient Hospital Stay: Payer: Medicaid Other

## 2020-10-16 ENCOUNTER — Inpatient Hospital Stay (HOSPITAL_BASED_OUTPATIENT_CLINIC_OR_DEPARTMENT_OTHER): Payer: Medicaid Other | Admitting: Internal Medicine

## 2020-10-16 DIAGNOSIS — C50812 Malignant neoplasm of overlapping sites of left female breast: Secondary | ICD-10-CM

## 2020-10-16 DIAGNOSIS — Z171 Estrogen receptor negative status [ER-]: Secondary | ICD-10-CM | POA: Diagnosis not present

## 2020-10-16 DIAGNOSIS — Z95828 Presence of other vascular implants and grafts: Secondary | ICD-10-CM

## 2020-10-16 LAB — CBC WITH DIFFERENTIAL/PLATELET
Abs Immature Granulocytes: 0.03 10*3/uL (ref 0.00–0.07)
Basophils Absolute: 0 10*3/uL (ref 0.0–0.1)
Basophils Relative: 0 %
Eosinophils Absolute: 0 10*3/uL (ref 0.0–0.5)
Eosinophils Relative: 0 %
HCT: 27.2 % — ABNORMAL LOW (ref 36.0–46.0)
Hemoglobin: 9.7 g/dL — ABNORMAL LOW (ref 12.0–15.0)
Immature Granulocytes: 1 %
Lymphocytes Relative: 23 %
Lymphs Abs: 1 10*3/uL (ref 0.7–4.0)
MCH: 33 pg (ref 26.0–34.0)
MCHC: 35.7 g/dL (ref 30.0–36.0)
MCV: 92.5 fL (ref 80.0–100.0)
Monocytes Absolute: 0.6 10*3/uL (ref 0.1–1.0)
Monocytes Relative: 14 %
Neutro Abs: 2.6 10*3/uL (ref 1.7–7.7)
Neutrophils Relative %: 62 %
Platelets: 203 10*3/uL (ref 150–400)
RBC: 2.94 MIL/uL — ABNORMAL LOW (ref 3.87–5.11)
RDW: 15.2 % (ref 11.5–15.5)
WBC: 4.3 10*3/uL (ref 4.0–10.5)
nRBC: 0 % (ref 0.0–0.2)

## 2020-10-16 LAB — BASIC METABOLIC PANEL
Anion gap: 12 (ref 5–15)
BUN: <5 mg/dL — ABNORMAL LOW (ref 6–20)
CO2: 21 mmol/L — ABNORMAL LOW (ref 22–32)
Calcium: 8.8 mg/dL — ABNORMAL LOW (ref 8.9–10.3)
Chloride: 92 mmol/L — ABNORMAL LOW (ref 98–111)
Creatinine, Ser: 0.54 mg/dL (ref 0.44–1.00)
GFR, Estimated: >60 mL/min (ref >60)
Glucose, Bld: 139 mg/dL — ABNORMAL HIGH (ref 70–99)
Potassium: 3.3 mmol/L — ABNORMAL LOW (ref 3.5–5.1)
Sodium: 125 mmol/L — ABNORMAL LOW (ref 135–145)

## 2020-10-16 MED ORDER — PROCHLORPERAZINE MALEATE 10 MG PO TABS: 10.0000 mg | ORAL_TABLET | Freq: Four times a day (QID) | ORAL | 1 refills | Status: DC | PRN

## 2020-10-16 MED ORDER — HEPARIN SOD (PORK) LOCK FLUSH 100 UNIT/ML IV SOLN
500.0000 [IU] | Freq: Once | INTRAVENOUS | Status: AC
  Administered 2020-10-16: 500 [IU] via INTRAVENOUS
  Filled 2020-10-16: qty 5

## 2020-10-16 MED ORDER — FLUTICASONE PROPIONATE 50 MCG/ACT NA SUSP: 1.0000 | Freq: Every day | NASAL | 9 refills | Status: AC

## 2020-10-16 MED ORDER — HYDROXYZINE HCL 25 MG PO TABS: 25.0000 mg | ORAL_TABLET | Freq: Three times a day (TID) | ORAL | 1 refills | Status: DC | PRN

## 2020-10-16 MED ORDER — ONDANSETRON HCL 8 MG PO TABS: ORAL_TABLET | ORAL | 1 refills | Status: DC

## 2020-10-16 MED ORDER — LEVOTHYROXINE SODIUM 88 MCG PO TABS: 88.0000 ug | ORAL_TABLET | Freq: Every day | ORAL | 1 refills | Status: DC

## 2020-10-16 NOTE — Assessment & Plan Note (Addendum)
#  Left breast cancer T4 N2 M0-stage III triple negative; s/p Adriamycin Cytoxan every 3 weeks x4; US/mamo- March 25th- partial response noted [currently 4cm; improved LN]; however progression noted-see below.   # Currently- s/p carboplatin-Keytruda-Abraxane cycle #2; day-1; chemotherapy currently on hold because of concerns of progression while on therapy.  CT scan chest abdomen pelvis-shows improvement of the left breast mass/axillary neuropathy; however concern for omental disease right upper quadrant concerning for metastatic disease.  Recommend a PET scan for further evaluation.   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   # schizophrenia: STABLE; Tritnity [phone]; Dr.Ahluwalia].   # Genetics: s/p genetic testing/counseling-discussed with genetic counselor-verbal report-negative for any deleterious mutations.  #Discussed with Dr. Chriss Driver patient's locally advanced disease/possible omental disease/lack of chemotherapeutic sensitivity-I suspect patient is most likely incurable.  Patient will benefit from palliative mastectomy as quickly as possible; otherwise local control would be very difficult.  I spoke to patient's Sister Audrey-regarding our serious concerns of incurable nature of the disease, and the fact that surgery would be recommended for palliative reasons.  Patient sister understands the situation; and agrees to keep appointment with PET scan as planned on 5/26; and appointment with Dr. Peyton Najjar on 5/27.   # DISPOSITION:  #  HOLD chemo today;HOLD   Kcl 20 meq; de-access # PET scan ASAP # follow up TBD-Dr.B  # I reviewed the blood work- with the patient in detail; also reviewed the imaging independently [as summarized above]; and with the patient in detail.    # 40 minutes face-to-face with the patient  discussing the above plan of care; more than 50% of time spent on prognosis/ natural history; counseling and coordination.

## 2020-10-16 NOTE — Progress Notes (Signed)
Tamara [name] CONSULT NOTE  Patient Care Team: Physicians, [place] as PCP - General Tamara Crofts, [name] as Consulting Physician (Neurology) Tamara Sickle, [name] as Consulting Physician (Internal Medicine) Tamara Pigg, [name] as Consulting Physician (Psychiatry) Tamara [name] as Consulting Physician (General Surgery)  CHIEF COMPLAINTS/PURPOSE OF CONSULTATION: Breast cancer  #  Oncology History Overview Note  # NOV 2021- LEFT BREAST CANCER - [place]; TRIPLE NEGATIVE; Grade of invasive carcinoma: Grade [name]; # LYMPH NODE, LEFT AXILLA; [Tamara [name]]; ULTRASOUND-GUIDED CORE BIOPSY:  - METASTATIC MAMMARY CARCINOMA, AT LEAST 9 MM METASTASIS. ]; DEC 9th PET scan-Intensely hypermetabolic 35.4 cm left breast mass compatible with primary left breast malignancy, with left pectoralis muscle involvement posteriorly and cutaneous involvement by the mass. Multiple smaller hypermetabolic satellite malignant nodules scattered throughout the left breast. Bulky hypermetabolic left axillary and left retropectoral nodal metastases.   # 12/10- Carboplatin ONLY;  Taxol hypersensitivity reaction-discontinued  # 12/17- AC #1 q [name] W x4; MARCH 28th- CARBO-ABRAXANE-KEYTRUDA; #cycle 1-8 [abraxane dose reduced to 75/m2- sec to diarrhea-[name]]  # SURVIVORSHIP:   # GENETICS: p  DIAGNOSIS: Triple negative breast cancer  STAGE: Stage III       ;  GOALS: Cure  CURRENT/MOST RECENT THERAPY : AC    Carcinoma of overlapping sites of left breast in female, estrogen receptor negative ([place])  04/24/2020 Initial Diagnosis   Carcinoma of overlapping sites of left breast in female, estrogen receptor negative ([place])   05/04/2020 - 05/04/2020 Chemotherapy   The patient had dexamethasone (DECADRON) 4 MG tablet, 8 mg, Oral, Daily, 1 of 1 cycle, Start date: --, End date: -- palonosetron (ALOXI) injection 0.25 mg, 0.25 mg, Intravenous,  Once, 1 of 12 cycles Administration: 0.25 mg  (05/04/2020) CARBOplatin (PARAPLATIN) 220 mg in sodium chloride 0.9 % 250 mL chemo infusion, 220 mg (100 % of original dose 222.4 mg), Intravenous,  Once, 1 of 12 cycles Dose modification:   (original dose 222.4 mg, Cycle 1) Administration: 220 mg (05/04/2020) PACLitaxel (TAXOL) 132 mg in sodium chloride 0.9 % 250 mL chemo infusion (</= 41m/m2), 80 mg/m2 = 132 mg, Intravenous,  Once, 1 of 12 cycles Administration: 132 mg (05/04/2020)  for chemotherapy treatment.    05/11/2020 - [name]/12/2020 Chemotherapy         06/14/2020 Cancer Staging   Staging form: Breast, AJCC 8th Edition - Clinical: Stage IIIC (cT4, cN2b, cM0, G3, ER-, PR-, HER2-) - Signed by Tamara [name] on 06/14/2020   [name]/28/2022 -  Chemotherapy    Patient is on Treatment Plan: BREAST PEMBROLIZUMAB + CARBOPLATIN D1 + ABRAXANEL D1,8,15 Q21D X 4 CYCLES        HISTORY OF PRESENTING ILLNESS: Patient is accompanied by her mother/patient is poor historian. [name].  female with schizophrenia and left locally advanced triple negative breast cancer T4 N2; currently on neoadjuvant chemotherapy with carboplatin and Abraxane Keytruda.  Patient is currently status post cycle number 2 day 1 carbo-keytruda  Abraxane approximately 2 week ago.  Chemotherapy is currently on hold because of clinical progression noted on treatment.  Patient is here to review the results of her CAT scan.  Patient complains of itching around the left breast.  Complains of mild discomfort in the left breast.  No headaches.  Denies any nausea vomiting.  Review of Systems  Unable to perform ROS: Psychiatric disorder     MEDICAL HISTORY:  Past Medical History:  Diagnosis Date  . Family history of breast cancer   .  Family history of stomach cancer   . Family history of stomach cancer   . GERD (gastroesophageal reflux disease)   . IUD (intrauterine device) in place IUD was placed in 2016  . Leiomyoma of uterus   . Schizophrenia ([place])    . Thyroid disease     SURGICAL HISTORY: Past Surgical History:  Procedure Laterality Date  . BREAST BIOPSY Left 03/2020   Tamara [name]  . PORTACATH PLACEMENT Right 05/02/2020   Procedure: INSERTION PORT-A-CATH;  Surgeon: Tamara [name];  Location: [facility] ORS;  Service: General;  Laterality: Right;    SOCIAL HISTORY: Social History   Socioeconomic History  . Marital status: Single    Spouse name: Not on file  . Number of children: Not on file  . Years of education: Not on file  . Highest education level: Not on file  Occupational History  . Not on file  Tobacco Use  . Smoking status: Never Smoker  . Smokeless tobacco: Never Used  Substance and Sexual Activity  . Alcohol use: No  . Drug use: No  . Sexual activity: Yes    Partners: Male    Birth control/protection: Surgical, Injection  Other Topics Concern  . Not on file  Social History Narrative  . Not on file   Social Determinants of Health   Financial Resource Strain: Not on file  Food Insecurity: Not on file  Transportation Needs: Not on file  Physical Activity: Not on file  Stress: Not on file  Social Connections: Not on file  Intimate Partner Violence: Not on file    FAMILY HISTORY: Family History  Problem Relation Age of Onset  . Breast cancer Mother   . Stomach cancer Maternal Grandfather   . Other Maternal Aunt        died from tumor, brain?    ALLERGIES:  is allergic to paclitaxel.  MEDICATIONS:  Current Outpatient Medications  Medication Sig Dispense Refill  . acetaminophen (TYLENOL) 650 MG CR tablet Take 650 mg by mouth every 8 (eight) hours as needed for pain.    Marland Kitchen BANOPHEN 25 MG capsule Take 25 mg by mouth every 6 (six) hours as needed for itching.    . benztropine (COGENTIN) 0.5 MG tablet Take 1 tablet (0.5 mg total) by mouth 2 (two) times daily. 60 tablet 1  . cetirizine (ZYRTEC) 10 MG tablet Take 1 tablet (10 mg total) by mouth daily as needed for allergies.    .  clonazePAM (KLONOPIN) 0.5 MG tablet Take 0.5 mg by mouth at bedtime.    . diphenoxylate-atropine (LOMOTIL) 2.5-0.025 MG tablet Take 1 tablet by mouth 4 (four) times daily as needed for diarrhea or loose stools. Take it along with immodium 45 tablet 0  . divalproex (DEPAKOTE) 500 MG DR tablet Take 1 tablet (500 mg total) by mouth every 12 (twelve) hours. 60 tablet 1  . haloperidol (HALDOL) 5 MG tablet Take 1 tablet (5 mg total) by mouth [name] (three) times daily. 90 tablet 1  . haloperidol decanoate (HALDOL DECANOATE) 100 MG/ML injection Inject 100 mg into the muscle every 28 (twenty-eight) days.    Marland Kitchen lidocaine-prilocaine (EMLA) cream Apply 1 application topically as needed. 30 g [name]  . medroxyPROGESTERone (DEPO-PROVERA) 150 MG/ML injection Inject into the muscle.    Marland Kitchen MELATONIN PO Take 1 tablet by mouth at bedtime as needed (sleep).    Marland Kitchen olopatadine (PATANOL) 0.1 % ophthalmic solution Place 1 drop into both eyes 2 (two) times daily. 10 mL 1  .  potassium chloride SA (KLOR-CON M20) 20 MEQ tablet Take 1 tablet (20 mEq total) by mouth 2 (two) times daily. 60 tablet 0  . sodium chloride 1 g tablet Take 1 tablet (1 g total) by mouth [name] (three) times daily. 90 tablet 0  . fluticasone (FLONASE) 50 MCG/ACT nasal spray Place 1 spray into both nostrils daily. 11.1 mL 9  . hydrOXYzine (ATARAX/VISTARIL) 25 MG tablet Take 1 tablet (25 mg total) by mouth every 8 (eight) hours as needed. 60 tablet 1  . levothyroxine (SYNTHROID) 88 MCG tablet Take 1 tablet (88 mcg total) by mouth daily at 6 (six) AM. 30 tablet 1  . ondansetron (ZOFRAN) 8 MG tablet One pill every 8 hours as needed for nausea/vomitting. 40 tablet 1  . prochlorperazine (COMPAZINE) 10 MG tablet Take 1 tablet (10 mg total) by mouth every 6 (six) hours as needed for nausea or vomiting. 40 tablet 1   No current facility-administered medications for this visit.   Facility-Administered Medications Ordered in Other Visits  Medication Dose Route Frequency Provider  Last Rate Last Admin  . sodium chloride flush (NS) 0.9 % injection 10 mL  10 mL Intravenous PRN Tamara Sickle, [name]   10 mL at 06/14/20 0840      .  PHYSICAL EXAMINATION: ECOG PERFORMANCE STATUS: 1 - Symptomatic but completely ambulatory  Vitals:   10/16/20 1008  Resp: 16  Temp: 97.7 F (36.5 C)   Filed Weights   10/16/20 1008  Weight: 150 lb (68 kg)    Physical Exam Constitutional:      Comments: Accompanied by sister.  Ambulating independently.  HENT:     Head: Normocephalic and atraumatic.     Mouth/Throat:     Pharynx: No oropharyngeal exudate.  Eyes:     Pupils: Pupils are equal, round, and reactive to light.  Cardiovascular:     Rate and Rhythm: Normal rate and regular rhythm.  Pulmonary:     Effort: No respiratory distress.     Breath sounds: No wheezing.  Abdominal:     General: Bowel sounds are normal. There is no distension.     Palpations: Abdomen is soft. There is no mass.     Tenderness: There is no abdominal tenderness. There is no guarding or rebound.  Musculoskeletal:        General: No tenderness. Normal range of motion.     Cervical back: Normal range of motion and neck supple.  Skin:    General: Skin is warm.     Comments: Large left-sided breast ~ 5 x 4cm (overall improved); however noted to have worsening edema/skin changes concerning for progression while on chemotherapy.  Neurological:     Mental Status: She is alert and oriented to person, place, and time.  Psychiatric:        Mood and Affect: Affect normal.     Comments: Poor insight/tangential thoughts.  Unable to make a coherent conversation-underlying schizophrenia.      LABORATORY DATA:  I have reviewed the data as listed Lab Results  Component Value Date   WBC 4.[name] 10/16/2020   HGB 9.7 (L) 10/16/2020   HCT 27.2 (L) 10/16/2020   MCV 92.5 10/16/2020   PLT 203 10/16/2020   Recent Labs    09/11/20 0817 09/18/20 0827 09/26/20 0754 10/02/20 0850 10/10/20 0755  10/16/20 0945  NA 132*   < > 128* 123* 126* 125*  K [name].2*   < > [name].6 [name].1* [name].6 [name].[name]*  CL 97*   < >  92* 86* 92* 92*  CO2 24   < > _0 21*  GLUCOSE 156*   < > 87 81 94 139*  BUN <5*   < > <5* 5* <5* <5*  CREATININE 0.54   < > 0.51 0.47 0.57 0.54  CALCIUM 9.4   < > 9.[name] 8.8* 9.[name] 8.8*  GFRNONAA >60   < > >60 >60 >60 >60  PROT 7.5  --  7.2 6.7  --   --   ALBUMIN [name].8  --  [name].7 [name].8  --   --   AST 26  --  20 29  --   --   ALT 11  --  10 12  --   --   ALKPHOS 57  --  55 55  --   --   BILITOT 0.[name]  --  0.4 0.5  --   --    < > = values in this interval not displayed.    RADIOGRAPHIC STUDIES: I have personally reviewed the radiological images as listed and agreed with the findings in the report. CT CHEST ABDOMEN PELVIS W CONTRAST  Result Date: 10/15/2020 CLINICAL DATA:  Assess treatment response in the setting of breast cancer. EXAM: CT CHEST, ABDOMEN, AND PELVIS WITH CONTRAST TECHNIQUE: Multidetector CT imaging of the chest, abdomen and pelvis was performed following the standard protocol during bolus administration of intravenous contrast. CONTRAST:  140m OMNIPAQUE IOHEXOL 300 MG/ML  SOLN COMPARISON:  April 04, 2020. FINDINGS: CT CHEST FINDINGS Cardiovascular: RIGHT-sided Port-A-Cath terminates at the upper portion of the RIGHT atrium. Heart size is normal. No pericardial effusion. Central pulmonary vasculature is unremarkable on venous phase. Mediastinum/Nodes: Small LEFT thoracic inlet lymph node (image 8/2) 9 mm, previously 10 mm short axis. Decreased size of LEFT axillary lymph nodes and resolution of some LEFT axillary lymph nodes. Dominant lymph node in the LEFT axilla 11 mm previously 28 mm (image 19/2) Dominant LEFT axillary/borderline retropectoral lymph node on image 13/2 previously seen extending below the LEFT pectoralis minor, at the edge of the muscle on the current study measuring 9 mm, previously 15 mm short axis. RIGHT retropectoral lymph node and axillary lymph node with stable  appearance. Retropectoral node (image 12/2) 7 mm short axis. Small LEFT internal mammary lymph node no longer clearly visible previously 8 mm short axis. No new adenopathy in the chest.  Esophagus grossly normal. Large LEFT breast mass has diminished in size previously measuring approximately 8.4 x 6.8 cm now approximately 7.[name] x 6.1 cm. Still with signs of enhancement and some scattered areas of necrosis. Small adjacent infra mammary lymph nodes or satellite nodules largest measuring 7 mm (image 27/2) previously 1 cm. No new nodules or lymph nodes in the area of the mass. Lungs/Pleura: No signs of consolidation, pleural effusion or suspicious pulmonary nodule. Airways are patent. Granulomatous disease with calcified nodule in the medial RIGHT upper lobe. Mild pulmonary emphysema Musculoskeletal: LEFT breast mass as described. No acute musculoskeletal findings about the bony thorax. See below for full musculoskeletal detail. CT ABDOMEN PELVIS FINDINGS Hepatobiliary: No focal, suspicious hepatic lesion. No pericholecystic stranding. No biliary duct dilation. Portal vein is patent. Pancreas: Normal, without mass, inflammation or ductal dilatation. Spleen: Spleen normal size and contour.  No focal splenic lesion. Adrenals/Urinary Tract: Adrenal glands are normal. Symmetric renal enhancement. No hydronephrosis. No suspicious renal lesion. Urinary bladder under distended. Question of mild hyperemia of the urinary bladder wall though difficult to assess due to under distension. Stomach/Bowel: Stomach and small bowel  without acute process. Appendix normal caliber and appearance aside from the nodular fascial thickening that is seen in the RIGHT lower quadrant adjacent to the appendix subjectively potentially slightly increased though difficult to determine based on adjacent bowel, for instance on image 83 of series 2. No signs of acute colonic process. Vascular/Lymphatic: Aorta with smooth contours. No aneurysmal dilation.  Smooth contour of the IVC. There is no gastrohepatic or hepatoduodenal ligament lymphadenopathy. No retroperitoneal or mesenteric lymphadenopathy. No pelvic sidewall lymphadenopathy. Reproductive: Heterogeneous enhancing areas in the region of the uterus without interval change uterus not visualized as a discrete organ. Other: Subtle infiltration of the omentum throughout the LEFT upper quadrant subjectively slightly increased but without measurable disease in this area, seen in concert with fascial thickening in the RIGHT lower quadrant again suspicious for peritoneal disease. Musculoskeletal: Spinal degenerative changes. No acute or destructive bone process. IMPRESSION: 1. Interval decrease in size of the LEFT breast mass. 2. Diminished size of LEFT axillary and internal mammary lymph nodes. [name]. No new adenopathy in the chest. 4. Signs of potential peritoneal disease subjectively potentially slightly increased. Close attention on follow-up is suggested. 5. Presumed multiple leiomyomata in the pelvis without change. 6. Emphysema and aortic atherosclerosis. Aortic Atherosclerosis (ICD10-I70.0) and Emphysema (ICD10-J43.9). Electronically Signed   By: Zetta Bills M.D.   On: 10/15/2020 16:27      06/04/2020-     [name]/11/2020-    On 4/26-    09/26/2020    10/02/2020   5/25     ASSESSMENT & PLAN:   Carcinoma of overlapping sites of left breast in female, estrogen receptor negative ([place]) #Left breast cancer T4 N2 M0-stage III triple negative; s/p Adriamycin Cytoxan every [name] weeks x4; US/mamo- March 25th- partial response noted [currently 4cm; improved LN]; however progression noted-see below.   # Currently- s/p carboplatin-Keytruda-Abraxane cycle #2; day-1; chemotherapy currently on hold because of concerns of progression while on therapy.  CT scan chest abdomen pelvis-shows improvement of the left breast mass/axillary neuropathy; however concern for omental disease right upper quadrant  concerning for metastatic disease.  Recommend a PET scan for further evaluation.   #Electrolytes abnormalities: chronic hyponatremia-likely secondary to SIDH/psychiatric medication-worse;  sodium today 123; discussed re: free water restriction;  Potassium [name].1; continue 20 meq twice daily at home.   # schizophrenia: STABLE; [facility] [phone]; Dr.Ahluwalia].   # Genetics: s/p genetic testing/counseling-discussed with genetic counselor-verbal report-negative for any deleterious mutations.  #Discussed with Dr. Chriss Driver patient's locally advanced disease/possible omental disease/lack of chemotherapeutic sensitivity-I suspect patient is most likely incurable.  Patient will benefit from palliative mastectomy as quickly as possible; otherwise local control would be very difficult.  I spoke to patient's Sister Audrey-regarding our serious concerns of incurable nature of the disease, and the fact that surgery would be recommended for palliative reasons.  Patient sister understands the situation; and agrees to keep appointment with PET scan as planned on 5/26; and appointment with Dr. Peyton Najjar on 5/27.   # DISPOSITION:  #  HOLD chemo today;HOLD   Kcl 20 meq; de-access # PET scan ASAP # follow up TBD-Dr.B  # I reviewed the blood work- with the patient in detail; also reviewed the imaging independently [as summarized above]; and with the patient in detail.    # 40 minutes face-to-face with the patient discussing the above plan of care; more than 50% of time spent on prognosis/ natural history; counseling and coordination.      All questions were answered. The patient knows to call the clinic  with any problems, questions or concerns.    Tamara Sickle, [name] 10/18/2020 7:49 AM

## 2020-10-18 ENCOUNTER — Encounter: Payer: Self-pay | Admitting: Internal Medicine

## 2020-10-18 ENCOUNTER — Ambulatory Visit
Admission: RE | Admit: 2020-10-18 | Discharge: 2020-10-18 | Disposition: A | Discharge status: Home / Self Care | Payer: Medicaid Other | Source: Ambulatory Visit | Attending: Internal Medicine | Admitting: Internal Medicine

## 2020-10-18 ENCOUNTER — Other Ambulatory Visit: Payer: Self-pay

## 2020-10-18 DIAGNOSIS — D259 Leiomyoma of uterus, unspecified: Secondary | ICD-10-CM | POA: Insufficient documentation

## 2020-10-18 DIAGNOSIS — Z171 Estrogen receptor negative status [ER-]: Secondary | ICD-10-CM | POA: Insufficient documentation

## 2020-10-18 DIAGNOSIS — C50812 Malignant neoplasm of overlapping sites of left female breast: Secondary | ICD-10-CM | POA: Insufficient documentation

## 2020-10-18 LAB — GLUCOSE, CAPILLARY: Glucose-Capillary: 82 mg/dL (ref 70–99)

## 2020-10-18 MED ORDER — FLUDEOXYGLUCOSE F - 18 (FDG) INJECTION
8.2500 | Freq: Once | INTRAVENOUS | Status: AC | PRN
  Administered 2020-10-18: 8.25 via INTRAVENOUS

## 2020-10-23 ENCOUNTER — Ambulatory Visit (INDEPENDENT_AMBULATORY_CARE_PROVIDER_SITE_OTHER): Payer: Medicaid Other | Admitting: Surgical

## 2020-10-23 ENCOUNTER — Encounter: Payer: Self-pay | Admitting: Licensed Clinical Social Worker

## 2020-10-23 ENCOUNTER — Encounter: Payer: Self-pay | Admitting: Internal Medicine

## 2020-10-23 ENCOUNTER — Other Ambulatory Visit: Payer: Self-pay

## 2020-10-23 ENCOUNTER — Encounter: Payer: Self-pay | Admitting: Surgical

## 2020-10-23 VITALS — BP 139/78 | HR 97 | Ht 62.0 in | Wt 150.6 lb

## 2020-10-23 DIAGNOSIS — N6320 Unspecified lump in the left breast, unspecified quadrant: Secondary | ICD-10-CM | POA: Diagnosis not present

## 2020-10-23 DIAGNOSIS — Z171 Estrogen receptor negative status [ER-]: Secondary | ICD-10-CM

## 2020-10-23 DIAGNOSIS — C50812 Malignant neoplasm of overlapping sites of left female breast: Secondary | ICD-10-CM | POA: Diagnosis not present

## 2020-10-23 NOTE — Telephone Encounter (Signed)
Attempted to reach patient x3 with genetic test results, no response. Letter mailed to patient with contact info, noting we have results back and to please call so we can go over these or let us know if she no longer wants these results.

## 2020-10-23 NOTE — Progress Notes (Signed)
Referring Provider Physicians, [facility] Faculty [address] [place],  [place] [address]   CC:  Chief Complaint  Patient presents with  . Advice Only      Tamara [name] is an 50 y.o. female.  HPI: Patient is a 50 year old female who presents today for consultation for left breast cancer to discuss reconstructive options.  She has a large left breast mass.  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  She has been treated with neoadjuvant chemotherapy.  She reports that she has noticed increased swelling of her left breast.  She reports that she is having pain over the left breast.  Patient had initial ultrasound on 04/03/2020 which was concerning for malignancy.  Patient subsequently underwent ultrasound of her left breast with biopsy which showed metastatic mammary carcinoma of the left breast mass and of the left axillary lymph node.  She subsequently underwent PET scan on 05/02/2020 which showed 10.6 cm left breast mass with pectoralis muscle involvement posteriorly and cutaneous involvement.  She had multiple smaller hypermetabolic satellite malignant nodules scattered throughout the left breast.  Bulky hypermetabolic left axillary and left retropectoral nodal metastases also noted.  After neoadjuvant chemotherapy there seem to be some improvement with decrease in size of the mass and lymph nodes.  Patient presents today with her mother.  Mother reports that she previously had breast cancer as well and underwent mastectomy followed by no reconstruction.  She reports that she currently wears a prosthesis and this is what her daughter Dianah has elected as well.  Tata also confirms this during today's appointment.  Allergies  Allergen Reactions  . Paclitaxel Anaphylaxis    Hypersensitivity reaction [flushing; shortness of breath; drop in blood pressure; tachycardia]    Outpatient Encounter Medications as of 10/23/2020  Medication  Sig  . acetaminophen (TYLENOL) 650 MG CR tablet Take 650 mg by mouth every 8 (eight) hours as needed for pain.  Marland Kitchen BANOPHEN 25 MG capsule Take 25 mg by mouth every 6 (six) hours as needed for itching.  . benztropine (COGENTIN) 0.5 MG tablet Take 1 tablet (0.5 mg total) by mouth 2 (two) times daily.  . cetirizine (ZYRTEC) 10 MG tablet Take 1 tablet (10 mg total) by mouth daily as needed for allergies.  . clonazePAM (KLONOPIN) 0.5 MG tablet Take 0.5 mg by mouth at bedtime.  . diphenoxylate-atropine (LOMOTIL) 2.5-0.025 MG tablet Take 1 tablet by mouth 4 (four) times daily as needed for diarrhea or loose stools. Take it along with immodium  . divalproex (DEPAKOTE) 500 MG DR tablet Take 1 tablet (500 mg total) by mouth every 12 (twelve) hours.  . fluticasone (FLONASE) 50 MCG/ACT nasal spray Place 1 spray into both nostrils daily.  . haloperidol (HALDOL) 5 MG tablet Take 1 tablet (5 mg total) by mouth 3 (three) times daily.  . haloperidol decanoate (HALDOL DECANOATE) 100 MG/ML injection Inject 100 mg into the muscle every 28 (twenty-eight) days.  . hydrOXYzine (ATARAX/VISTARIL) 25 MG tablet Take 1 tablet (25 mg total) by mouth every 8 (eight) hours as needed.  Marland Kitchen levothyroxine (SYNTHROID) 88 MCG tablet Take 1 tablet (88 mcg total) by mouth daily at 6 (six) AM.  . lidocaine-prilocaine (EMLA) cream Apply 1 application topically as needed.  . medroxyPROGESTERone (DEPO-PROVERA) 150 MG/ML injection Inject into the muscle.  Marland Kitchen MELATONIN PO Take 1 tablet by mouth at bedtime as needed (sleep).  Marland Kitchen olopatadine (PATANOL) 0.1 % ophthalmic solution Place 1 drop into both eyes  2 (two) times daily.  . ondansetron (ZOFRAN) 8 MG tablet One pill every 8 hours as needed for nausea/vomitting.  . potassium chloride SA (KLOR-CON M20) 20 MEQ tablet Take 1 tablet (20 mEq total) by mouth 2 (two) times daily.  . prochlorperazine (COMPAZINE) 10 MG tablet Take 1 tablet (10 mg total) by mouth every 6 (six) hours as needed for nausea  or vomiting.  . sodium chloride 1 g tablet Take 1 tablet (1 g total) by mouth 3 (three) times daily.   Facility-Administered Encounter Medications as of 10/23/2020  Medication  . sodium chloride flush (NS) 0.9 % injection 10 mL     Past Medical History:  Diagnosis Date  . Family history of breast cancer   . Family history of stomach cancer   . Family history of stomach cancer   . GERD (gastroesophageal reflux disease)   . IUD (intrauterine device) in place IUD was placed in 2016  . Leiomyoma of uterus   . Schizophrenia ([place])   . Thyroid disease     Past Surgical History:  Procedure Laterality Date  . BREAST BIOPSY Left 03/2020   [facility]C- Triple neg- Stage 3  . PORTACATH PLACEMENT Right 05/02/2020   Procedure: INSERTION PORT-A-CATH;  Surgeon: Herbert Pun, MD;  Location: [facility] ORS;  Service: General;  Laterality: Right;    Family History  Problem Relation Age of Onset  . Breast cancer Mother   . Stomach cancer Maternal Grandfather   . Other Maternal Aunt        died from tumor, brain?    Social History   Social History Narrative  . Not on file     Review of Systems General: Denies fevers, chills, weight loss CV: Denies chest pain, shortness of breath, palpitations   Physical Exam Vitals with BMI 10/23/2020 10/16/2020 10/10/2020  Height 5\' 2"  5\' 1"  5\' 1"   Weight 150 lbs 10 oz 150 lbs 150 lbs  BMI 27.54 03.50 09.38  Systolic 182 - 993  Diastolic 78 - 63  Pulse 97 - 83  Some encounter information is confidential and restricted. Go to Review Flowsheets activity to see all data.    General:  No acute distress,  Alert and oriented, Non-Toxic, Normal speech and affect HEENT: Pupils equal, EOM's intact, sclera clear Pulm: Breathing is unlabored Normal right breast with Port-A-Cath in place superiorly.  No right axillary adenopathy noted. Left breast: Very large left breast mass with significant swelling and erythema, this seems to originate from the upper outer  quadrant.  This is tender to palpation.  It is very firm.  No ulceration or wounds noted.  The skin is very shiny.  Some axillary adenopathy is noted with palpation.  There is some peau d'orange skin changes noted inferior medially. 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  This is currently on hold due to clinical progression of the disease and significant hyponatremia.  She has been evaluated by general surgery.  Per general surgery EMR note review patient will need radical mastectomy with possible involvement of the pectoralis muscle and complete axillary node dissection.  We discussed options for reconstruction and this was thoroughly explained to the patient and family for breast reconstruction.  The options for reconstruction include placement of a tissue expander with acellular dermal matrix followed by placement of an implant after the  expander has reached its desired size.  We also discussed no reconstruction and the use of a prosthetic implant that can be placed in patient's bra.  We also discussed autologous reconstruction using muscular tissue from another area of the body to create a breast.  After thorough discussion with the patient and patient's mother, they agreed upon preference for no reconstruction and patient would prefer a prosthetic bra insert.  We discussed that given the significant swelling and size of the patient's left breast mass that she is at an increased risk of postoperative complications given the inflammatory changes and is at risk of difficulty with closing the left breast after mastectomy.  I discussed the patient and her mother that I would further discuss the patient's case with Dr. Marla Roe and we will discuss this with general surgery and plan  to be available as needed for assistance.  All of their questions were answered to their content.  Pictures were taken and placed in the patient's chart with the patient's permission.  I discussed with the patient and her mother that we would call to discuss this in the next few days.  Total time spent today with the patient and family: 45 minutes.  This included time spent with the patient during the visit, time spent before the visit and after the visit reviewing the patient's chart, reviewing the medical history, documenting the encounter   Charlies Constable 10/23/2020, 3:58 PM

## 2020-10-23 NOTE — H&P (View-Only) (Signed)
Referring Provider Physicians, [facility] Faculty [address] [place],  [name] [id number]   CC:  Chief Complaint  Patient presents with  . Advice Only      Tamara [name] is an 50 y.o. female.  HPI: Patient is a 50 year old female who presents today for consultation for left breast cancer to discuss reconstructive options.  She has a large left breast mass.  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  She has been treated with neoadjuvant chemotherapy.  She reports that she has noticed increased swelling of her left breast.  She reports that she is having pain over the left breast.  Patient had initial ultrasound on 04/03/2020 which was concerning for malignancy.  Patient subsequently underwent ultrasound of her left breast with biopsy which showed metastatic mammary carcinoma of the left breast mass and of the left axillary lymph node.  She subsequently underwent PET scan on 05/02/2020 which showed 10.6 cm left breast mass with pectoralis muscle involvement posteriorly and cutaneous involvement.  She had multiple smaller hypermetabolic satellite malignant nodules scattered throughout the left breast.  Bulky hypermetabolic left axillary and left retropectoral nodal metastases also noted.  After neoadjuvant chemotherapy there seem to be some improvement with decrease in size of the mass and lymph nodes.  Patient presents today with her mother.  Mother reports that she previously had breast cancer as well and underwent mastectomy followed by no reconstruction.  She reports that she currently wears a prosthesis and this is what her daughter Tamara [name] has elected as well.  Tamara [name] also confirms this during today's appointment.  Allergies  Allergen Reactions  . Paclitaxel Anaphylaxis    Hypersensitivity reaction [flushing; shortness of breath; drop in blood pressure; tachycardia]    Outpatient Encounter Medications as of 10/23/2020  Medication  Sig  . acetaminophen (TYLENOL) 650 MG CR tablet Take 650 mg by mouth every 8 (eight) hours as needed for pain.  Marland Kitchen BANOPHEN 25 MG capsule Take 25 mg by mouth every 6 (six) hours as needed for itching.  . benztropine (COGENTIN) 0.5 MG tablet Take 1 tablet (0.5 mg total) by mouth 2 (two) times daily.  . cetirizine (ZYRTEC) 10 MG tablet Take 1 tablet (10 mg total) by mouth daily as needed for allergies.  . clonazePAM (KLONOPIN) 0.5 MG tablet Take 0.5 mg by mouth at bedtime.  . diphenoxylate-atropine (LOMOTIL) 2.5-0.025 MG tablet Take 1 tablet by mouth 4 (four) times daily as needed for diarrhea or loose stools. Take it along with immodium  . divalproex (DEPAKOTE) 500 MG DR tablet Take 1 tablet (500 mg total) by mouth every 12 (twelve) hours.  . fluticasone (FLONASE) 50 MCG/ACT nasal spray Place 1 spray into both nostrils daily.  . haloperidol (HALDOL) 5 MG tablet Take 1 tablet (5 mg total) by mouth 3 (three) times daily.  . haloperidol decanoate (HALDOL DECANOATE) 100 MG/ML injection Inject 100 mg into the muscle every 28 (twenty-eight) days.  . hydrOXYzine (ATARAX/VISTARIL) 25 MG tablet Take 1 tablet (25 mg total) by mouth every 8 (eight) hours as needed.  Marland Kitchen levothyroxine (SYNTHROID) 88 MCG tablet Take 1 tablet (88 mcg total) by mouth daily at 6 (six) AM.  . lidocaine-prilocaine (EMLA) cream Apply 1 application topically as needed.  . medroxyPROGESTERone (DEPO-PROVERA) 150 MG/ML injection Inject into the muscle.  Marland Kitchen MELATONIN PO Take 1 tablet by mouth at bedtime as needed (sleep).  Marland Kitchen olopatadine (PATANOL) 0.1 % ophthalmic solution Place 1 drop into both eyes  2 (two) times daily.  . ondansetron (ZOFRAN) 8 MG tablet One pill every 8 hours as needed for nausea/vomitting.  . potassium chloride SA (KLOR-CON M20) 20 MEQ tablet Take 1 tablet (20 mEq total) by mouth 2 (two) times daily.  . prochlorperazine (COMPAZINE) 10 MG tablet Take 1 tablet (10 mg total) by mouth every 6 (six) hours as needed for nausea  or vomiting.  . sodium chloride 1 g tablet Take 1 tablet (1 g total) by mouth 3 (three) times daily.   Facility-Administered Encounter Medications as of 10/23/2020  Medication  . sodium chloride flush (NS) 0.9 % injection 10 mL     Past Medical History:  Diagnosis Date  . Family history of breast cancer   . Family history of stomach cancer   . Family history of stomach cancer   . GERD (gastroesophageal reflux disease)   . IUD (intrauterine device) in place IUD was placed in 2016  . Leiomyoma of uterus   . Schizophrenia ([place])   . Thyroid disease     Past Surgical History:  Procedure Laterality Date  . BREAST BIOPSY Left 03/2020   [facility]C- Triple neg- Stage 3  . PORTACATH PLACEMENT Right 05/02/2020   Procedure: INSERTION PORT-A-CATH;  Surgeon: Herbert Pun, MD;  Location: [facility] ORS;  Service: General;  Laterality: Right;    Family History  Problem Relation Age of Onset  . Breast cancer Mother   . Stomach cancer Maternal Grandfather   . Other Maternal Aunt        died from tumor, brain?    Social History   Social History Narrative  . Not on file     Review of Systems General: Denies fevers, chills, weight loss CV: Denies chest pain, shortness of breath, palpitations   Physical Exam Vitals with BMI 10/23/2020 10/16/2020 10/10/2020  Height 5\' 2"  5\' 1"  5\' 1"   Weight 150 lbs 10 oz 150 lbs 150 lbs  BMI 27.54 28.78 67.67  Systolic 209 - 470  Diastolic 78 - 63  Pulse 97 - 83  Some encounter information is confidential and restricted. Go to Review Flowsheets activity to see all data.    General:  No acute distress,  Alert and oriented, Non-Toxic, Normal speech and affect HEENT: Pupils equal, EOM's intact, sclera clear Pulm: Breathing is unlabored Normal right breast with Port-A-Cath in place superiorly.  No right axillary adenopathy noted. Left breast: Very large left breast mass with significant swelling and erythema, this seems to originate from the upper outer  quadrant.  This is tender to palpation.  It is very firm.  No ulceration or wounds noted.  The skin is very shiny.  Some axillary adenopathy is noted with palpation.  There is some peau d'orange skin changes noted inferior medially. 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  This is currently on hold due to clinical progression of the disease and significant hyponatremia.  She has been evaluated by general surgery.  Per general surgery EMR note review patient will need radical mastectomy with possible involvement of the pectoralis muscle and complete axillary node dissection.  We discussed options for reconstruction and this was thoroughly explained to the patient and family for breast reconstruction.  The options for reconstruction include placement of a tissue expander with acellular dermal matrix followed by placement of an implant after the  expander has reached its desired size.  We also discussed no reconstruction and the use of a prosthetic implant that can be placed in patient's bra.  We also discussed autologous reconstruction using muscular tissue from another area of the body to create a breast.  After thorough discussion with the patient and patient's mother, they agreed upon preference for no reconstruction and patient would prefer a prosthetic bra insert.  We discussed that given the significant swelling and size of the patient's left breast mass that she is at an increased risk of postoperative complications given the inflammatory changes and is at risk of difficulty with closing the left breast after mastectomy.  I discussed the patient and her mother that I would further discuss the patient's case with Dr. Marla Roe and we will discuss this with general surgery and plan  to be available as needed for assistance.  All of their questions were answered to their content.  Pictures were taken and placed in the patient's chart with the patient's permission.  I discussed with the patient and her mother that we would call to discuss this in the next few days.  Total time spent today with the patient and family: 45 minutes.  This included time spent with the patient during the visit, time spent before the visit and after the visit reviewing the patient's chart, reviewing the medical history, documenting the encounter   Charlies Constable 10/23/2020, 3:58 PM

## 2020-10-26 ENCOUNTER — Inpatient Hospital Stay: Admission: RE | Admit: 2020-10-26 | Payer: Medicaid Other | Source: Ambulatory Visit

## 2020-10-26 ENCOUNTER — Other Ambulatory Visit: Payer: Medicaid Other

## 2020-10-26 ENCOUNTER — Encounter
Admission: RE | Admit: 2020-10-26 | Discharge: 2020-10-26 | Disposition: A | Discharge status: Home / Self Care | Payer: Medicaid Other | Source: Ambulatory Visit | Attending: General Surgery | Admitting: General Surgery

## 2020-10-26 ENCOUNTER — Other Ambulatory Visit: Payer: Self-pay

## 2020-10-26 ENCOUNTER — Ambulatory Visit: Payer: Self-pay | Admitting: General Surgery

## 2020-10-26 HISTORY — DX: Hypothyroidism, unspecified: E03.9

## 2020-10-26 NOTE — Pre-Procedure Instructions (Signed)
Patient did not show for covid test/lab work today. Surgery scheduled for Monday, June 6. Spoke with patient's sister, [name] about medications and preop instructions. [name] stated that she would let patient and mother (legal guardian) know that she needed the covid test and lab work done today before 12 pm. At 12 pm, patient did not show. Call to sister again and was told that the covid test and labs need to be done today ASAP. [name] was going to see what was going on. At 1:00 pm, no show; called home phone again but no answer. A message was left. Call to Dr. Windell Moment office. Office closed.

## 2020-10-26 NOTE — Patient Instructions (Addendum)
Your procedure is scheduled on:  Monday, June 6 Report to the Registration Desk on the 1st floor of the [facility]. To find out your arrival time, please call [phone] between 1PM - 3PM on: Friday, June 3  REMEMBER: Instructions that are not followed completely may result in serious medical risk, up to and including death; or upon the discretion of your surgeon and anesthesiologist your surgery may need to be rescheduled.  Do not eat or drink anything after midnight the night before surgery.  No gum chewing, lozengers or hard candies.  TAKE THESE MEDICATIONS THE MORNING OF SURGERY WITH A SIP OF WATER:  1.  Benztropine (cogentin) 2.  Divalproex (Depakote) 3.  Haloperidol (Haldol) 4.  Levothyroxine (synthroid)  One week prior to surgery:  Stop Anti-inflammatories (NSAIDS) such as Advil, Aleve, Ibuprofen, Motrin, Naproxen, Naprosyn and Aspirin based products such as Excedrin, Goodys Powder, BC Powder. Stop ANY OVER THE COUNTER supplements until after surgery. You may however, continue to take Tylenol if needed for pain up until the day of surgery.  No Alcohol for 24 hours before or after surgery.  No Smoking including e-cigarettes for 24 hours prior to surgery.  No chewable tobacco products for at least 6 hours prior to surgery.  No nicotine patches on the day of surgery.  Do not use any "recreational" drugs for at least a week prior to your surgery.  Please be advised that the combination of cocaine and anesthesia may have negative outcomes, up to and including death. If you test positive for cocaine, your surgery will be cancelled.  On the morning of surgery brush your teeth with toothpaste and water, you may rinse your mouth with mouthwash if you wish. Do not swallow any toothpaste or mouthwash.  Do not wear jewelry, make-up, hairpins, clips or nail polish.  Do not wear lotions, powders, or perfumes.   Do not shave body from the neck down 48 hours prior to surgery just  in case you cut yourself which could leave a site for infection.  Also, freshly shaved skin may become irritated if using the CHG soap.  Contact lenses, hearing aids and dentures may not be worn into surgery.  Do not bring valuables to the hospital. [facility] is not responsible for any missing/lost belongings or valuables.   Use CHG Soap as directed on instruction sheet.  Notify your doctor if there is any change in your medical condition (cold, fever, infection).  Wear comfortable clothing (specific to your surgery type) to the hospital.  Plan for stool softeners for home use; pain medications have a tendency to cause constipation. You can also help prevent constipation by eating foods high in fiber such as fruits and vegetables and drinking plenty of fluids as your diet allows.  After surgery, you can help prevent lung complications by doing breathing exercises.  Take deep breaths and cough every 1-2 hours. Your doctor may order a device called an Incentive Spirometer to help you take deep breaths.  If you are being admitted to the hospital overnight, leave your suitcase in the car. After surgery it may be brought to your room.  If you are being discharged the day of surgery, you will not be allowed to drive home. You will need a responsible adult (18 years or older) to drive you home and stay with you that night.   If you are taking public transportation, you will need to have a responsible adult (18 years or older) with you. Please confirm with  your physician that it is acceptable to use public transportation.   Please call the [facility] at [phone] if you have any questions about these instructions.  Surgery Visitation Policy:  Patients undergoing a surgery or procedure may have one family member or support person with them as long as that person is not COVID-19 positive or experiencing its symptoms.  That person may remain in the waiting area during  the procedure.  Inpatient Visitation:    Visiting hours are 7 a.m. to 8 p.m. Inpatients will be allowed two visitors daily. The visitors may change each day during the patient's stay. No visitors under the age of 8. Any visitor under the age of 63 must be accompanied by an adult. The visitor must pass COVID-19 screenings, use hand sanitizer when entering and exiting the patient's room and wear a mask at all times, including in the patient's room. Patients must also wear a mask when staff or their visitor are in the room. Masking is required regardless of vaccination status.

## 2020-10-26 NOTE — Pre-Procedure Instructions (Signed)
At 3:20 pm, call to patient's sister to let her know that the patient did not show up for the covid/lab tests today. Stated that they were on their way and would be here in 10-15 minutes. Informed the sister that if she did not get these done today that the patient would need to come in earlier on the day of surgery (Monday). Instructed that she would need to arrive Monday at 11:30 am. Acknowledged understanding. It is now after 4:00 pm and still no show. Chart taken to same day surgery.

## 2020-10-29 ENCOUNTER — Encounter: Payer: Self-pay | Admitting: General Surgery

## 2020-10-29 ENCOUNTER — Other Ambulatory Visit: Payer: Self-pay

## 2020-10-29 ENCOUNTER — Ambulatory Visit: Payer: Medicaid Other | Admitting: Anesthesiology

## 2020-10-29 ENCOUNTER — Encounter
Admission: RE | Disposition: A | Discharge status: Home Health | Payer: Self-pay | Source: Home / Self Care | Attending: General Surgery

## 2020-10-29 ENCOUNTER — Inpatient Hospital Stay
Admission: RE | Admit: 2020-10-29 | Discharge: 2020-10-31 | DRG: 580 | Disposition: A | Discharge status: Home Health | Payer: Medicaid Other | Attending: General Surgery | Admitting: General Surgery

## 2020-10-29 DIAGNOSIS — C773 Secondary and unspecified malignant neoplasm of axilla and upper limb lymph nodes: Secondary | ICD-10-CM | POA: Diagnosis present

## 2020-10-29 DIAGNOSIS — Z803 Family history of malignant neoplasm of breast: Secondary | ICD-10-CM

## 2020-10-29 DIAGNOSIS — Z79899 Other long term (current) drug therapy: Secondary | ICD-10-CM

## 2020-10-29 DIAGNOSIS — Z20822 Contact with and (suspected) exposure to covid-19: Secondary | ICD-10-CM | POA: Diagnosis present

## 2020-10-29 DIAGNOSIS — K219 Gastro-esophageal reflux disease without esophagitis: Secondary | ICD-10-CM | POA: Diagnosis present

## 2020-10-29 DIAGNOSIS — Z888 Allergy status to other drugs, medicaments and biological substances status: Secondary | ICD-10-CM

## 2020-10-29 DIAGNOSIS — E039 Hypothyroidism, unspecified: Secondary | ICD-10-CM | POA: Diagnosis present

## 2020-10-29 DIAGNOSIS — C50919 Malignant neoplasm of unspecified site of unspecified female breast: Secondary | ICD-10-CM | POA: Diagnosis present

## 2020-10-29 DIAGNOSIS — E871 Hypo-osmolality and hyponatremia: Secondary | ICD-10-CM | POA: Diagnosis present

## 2020-10-29 DIAGNOSIS — C50812 Malignant neoplasm of overlapping sites of left female breast: Principal | ICD-10-CM | POA: Diagnosis present

## 2020-10-29 DIAGNOSIS — F209 Schizophrenia, unspecified: Secondary | ICD-10-CM | POA: Diagnosis present

## 2020-10-29 DIAGNOSIS — F70 Mild intellectual disabilities: Secondary | ICD-10-CM | POA: Diagnosis present

## 2020-10-29 DIAGNOSIS — Z79890 Hormone replacement therapy (postmenopausal): Secondary | ICD-10-CM

## 2020-10-29 DIAGNOSIS — Z800 Family history of malignant neoplasm of digestive organs: Secondary | ICD-10-CM

## 2020-10-29 DIAGNOSIS — Z171 Estrogen receptor negative status [ER-]: Secondary | ICD-10-CM

## 2020-10-29 HISTORY — PX: APPLICATION OF WOUND VAC: SHX5189

## 2020-10-29 HISTORY — PX: APPLICATION OF A-CELL OF CHEST/ABDOMEN: SHX6302

## 2020-10-29 HISTORY — PX: ADJACENT TISSUE TRANSFER/TISSUE REARRANGEMENT: SHX6829

## 2020-10-29 HISTORY — PX: TOTAL MASTECTOMY: SHX6129

## 2020-10-29 LAB — BASIC METABOLIC PANEL
Anion gap: 12 (ref 5–15)
BUN: <5 mg/dL — ABNORMAL LOW (ref 6–20)
CO2: 22 mmol/L (ref 22–32)
Calcium: 9.6 mg/dL (ref 8.9–10.3)
Chloride: 98 mmol/L (ref 98–111)
Creatinine, Ser: 0.64 mg/dL (ref 0.44–1.00)
GFR, Estimated: >60 mL/min (ref >60)
Glucose, Bld: 78 mg/dL (ref 70–99)
Potassium: 4 mmol/L (ref 3.5–5.1)
Sodium: 132 mmol/L — ABNORMAL LOW (ref 135–145)

## 2020-10-29 LAB — HEMOGLOBIN AND HEMATOCRIT, BLOOD
HCT: 30.1 % — ABNORMAL LOW (ref 36.0–46.0)
Hemoglobin: 10.7 g/dL — ABNORMAL LOW (ref 12.0–15.0)

## 2020-10-29 LAB — POCT PREGNANCY, URINE: Preg Test, Ur: NEGATIVE

## 2020-10-29 LAB — SARS CORONAVIRUS 2 BY RT PCR (HOSPITAL ORDER, PERFORMED IN ~~LOC~~ HOSPITAL LAB): SARS Coronavirus 2: NEGATIVE

## 2020-10-29 SURGERY — MASTECTOMY, SIMPLE
Anesthesia: General | Site: Breast

## 2020-10-29 MED ORDER — ACETAMINOPHEN 500 MG PO TABS: 1000.0000 mg | ORAL_TABLET | ORAL | Status: AC

## 2020-10-29 MED ORDER — ACETAMINOPHEN 500 MG PO TABS
ORAL_TABLET | ORAL | Status: AC
  Administered 2020-10-29: 1000 mg via ORAL
  Filled 2020-10-29: qty 2

## 2020-10-29 MED ORDER — ONDANSETRON HCL 4 MG/2ML IJ SOLN
INTRAMUSCULAR | Status: DC | PRN
  Administered 2020-10-29: 4 mg via INTRAVENOUS

## 2020-10-29 MED ORDER — ROCURONIUM BROMIDE 100 MG/10ML IV SOLN
INTRAVENOUS | Status: DC | PRN
  Administered 2020-10-29: 50 mg via INTRAVENOUS
  Administered 2020-10-29: 10 mg via INTRAVENOUS
  Administered 2020-10-29: 20 mg via INTRAVENOUS

## 2020-10-29 MED ORDER — FENTANYL CITRATE (PF) 100 MCG/2ML IJ SOLN: 25.0000 ug | INTRAMUSCULAR | Status: DC | PRN

## 2020-10-29 MED ORDER — BUPIVACAINE-EPINEPHRINE (PF) 0.5% -1:200000 IJ SOLN
INTRAMUSCULAR | Status: AC
  Filled 2020-10-29: qty 30

## 2020-10-29 MED ORDER — BUPIVACAINE LIPOSOME 1.3 % IJ SUSP: 20.0000 mL | Freq: Once | INTRAMUSCULAR | Status: DC

## 2020-10-29 MED ORDER — BUPIVACAINE LIPOSOME 1.3 % IJ SUSP
INTRAMUSCULAR | Status: AC
  Filled 2020-10-29: qty 20

## 2020-10-29 MED ORDER — PANTOPRAZOLE SODIUM 40 MG IV SOLR
40.0000 mg | Freq: Every day | INTRAVENOUS | Status: DC
  Administered 2020-10-29: 40 mg via INTRAVENOUS
  Filled 2020-10-29: qty 40

## 2020-10-29 MED ORDER — FAMOTIDINE 20 MG PO TABS
ORAL_TABLET | ORAL | Status: AC
  Administered 2020-10-29: 20 mg via ORAL
  Filled 2020-10-29: qty 1

## 2020-10-29 MED ORDER — LIDOCAINE HCL (PF) 2 % IJ SOLN
INTRAMUSCULAR | Status: AC
  Filled 2020-10-29: qty 5

## 2020-10-29 MED ORDER — CELECOXIB 200 MG PO CAPS
ORAL_CAPSULE | ORAL | Status: AC
  Administered 2020-10-29: 200 mg via ORAL
  Filled 2020-10-29: qty 1

## 2020-10-29 MED ORDER — ONDANSETRON HCL 4 MG/2ML IJ SOLN: 4.0000 mg | Freq: Once | INTRAMUSCULAR | Status: DC | PRN

## 2020-10-29 MED ORDER — CELECOXIB 200 MG PO CAPS: 200.0000 mg | ORAL_CAPSULE | ORAL | Status: AC

## 2020-10-29 MED ORDER — ORAL CARE MOUTH RINSE: 15.0000 mL | Freq: Once | OROMUCOSAL | Status: AC

## 2020-10-29 MED ORDER — PROPOFOL 10 MG/ML IV BOLUS
INTRAVENOUS | Status: AC
  Filled 2020-10-29: qty 20

## 2020-10-29 MED ORDER — HEPARIN SODIUM (PORCINE) 5000 UNIT/ML IJ SOLN: 5000.0000 [IU] | Freq: Once | INTRAMUSCULAR | Status: AC

## 2020-10-29 MED ORDER — BUPIVACAINE LIPOSOME 1.3 % IJ SUSP
INTRAMUSCULAR | Status: DC | PRN
  Administered 2020-10-29: 20 mL

## 2020-10-29 MED ORDER — CLONAZEPAM 0.5 MG PO TABS
0.5000 mg | ORAL_TABLET | Freq: Every day | ORAL | Status: DC
  Administered 2020-10-29 – 2020-10-30 (×2): 0.5 mg via ORAL
  Filled 2020-10-29 (×2): qty 1

## 2020-10-29 MED ORDER — HYDROCODONE-ACETAMINOPHEN 5-325 MG PO TABS: 1.0000 | ORAL_TABLET | ORAL | Status: DC | PRN

## 2020-10-29 MED ORDER — ONDANSETRON HCL 4 MG/2ML IJ SOLN: 4.0000 mg | Freq: Four times a day (QID) | INTRAMUSCULAR | Status: DC | PRN

## 2020-10-29 MED ORDER — ONDANSETRON 4 MG PO TBDP: 4.0000 mg | ORAL_TABLET | Freq: Four times a day (QID) | ORAL | Status: DC | PRN

## 2020-10-29 MED ORDER — HALOPERIDOL DECANOATE 100 MG/ML IM SOLN: 100.0000 mg | INTRAMUSCULAR | Status: DC

## 2020-10-29 MED ORDER — KETAMINE HCL 50 MG/ML IJ SOLN
INTRAMUSCULAR | Status: DC | PRN
  Administered 2020-10-29 (×2): 25 mg via INTRAMUSCULAR

## 2020-10-29 MED ORDER — BUPIVACAINE-EPINEPHRINE 0.5% -1:200000 IJ SOLN
INTRAMUSCULAR | Status: DC | PRN
  Administered 2020-10-29: 30 mL

## 2020-10-29 MED ORDER — ENOXAPARIN SODIUM 40 MG/0.4ML IJ SOSY
40.0000 mg | PREFILLED_SYRINGE | INTRAMUSCULAR | Status: DC
  Administered 2020-10-30 – 2020-10-31 (×2): 40 mg via SUBCUTANEOUS
  Filled 2020-10-29 (×2): qty 0.4

## 2020-10-29 MED ORDER — HALOPERIDOL 5 MG PO TABS
5.0000 mg | ORAL_TABLET | Freq: Three times a day (TID) | ORAL | Status: DC
  Administered 2020-10-29 – 2020-10-31 (×5): 5 mg via ORAL
  Filled 2020-10-29 (×7): qty 1

## 2020-10-29 MED ORDER — CELECOXIB 200 MG PO CAPS
200.0000 mg | ORAL_CAPSULE | Freq: Two times a day (BID) | ORAL | Status: DC
  Administered 2020-10-29 – 2020-10-31 (×4): 200 mg via ORAL
  Filled 2020-10-29 (×5): qty 1

## 2020-10-29 MED ORDER — FENTANYL CITRATE (PF) 100 MCG/2ML IJ SOLN
INTRAMUSCULAR | Status: AC
  Filled 2020-10-29: qty 2

## 2020-10-29 MED ORDER — MELATONIN 3 MG PO TABS
3.0000 mg | ORAL_TABLET | Freq: Every evening | ORAL | Status: DC | PRN
  Filled 2020-10-29: qty 1

## 2020-10-29 MED ORDER — FAMOTIDINE 20 MG PO TABS: 20.0000 mg | ORAL_TABLET | Freq: Once | ORAL | Status: AC

## 2020-10-29 MED ORDER — LACTATED RINGERS IV SOLN: INTRAVENOUS | Status: DC

## 2020-10-29 MED ORDER — DIVALPROEX SODIUM 500 MG PO DR TAB
500.0000 mg | DELAYED_RELEASE_TABLET | Freq: Two times a day (BID) | ORAL | Status: DC
  Administered 2020-10-29 – 2020-10-31 (×4): 500 mg via ORAL
  Filled 2020-10-29 (×5): qty 1

## 2020-10-29 MED ORDER — SODIUM CHLORIDE 0.9 % IV SOLN
INTRAVENOUS | Status: DC | PRN
  Administered 2020-10-29: 10 ug/min via INTRAVENOUS

## 2020-10-29 MED ORDER — DEXAMETHASONE SODIUM PHOSPHATE 10 MG/ML IJ SOLN
INTRAMUSCULAR | Status: DC | PRN
  Administered 2020-10-29: 10 mg via INTRAVENOUS

## 2020-10-29 MED ORDER — SODIUM CHLORIDE FLUSH 0.9 % IV SOLN
INTRAVENOUS | Status: AC
  Filled 2020-10-29: qty 50

## 2020-10-29 MED ORDER — CEFAZOLIN SODIUM-DEXTROSE 2-4 GM/100ML-% IV SOLN
INTRAVENOUS | Status: AC
  Filled 2020-10-29: qty 100

## 2020-10-29 MED ORDER — SODIUM CHLORIDE 0.9 % IV SOLN: INTRAVENOUS | Status: DC

## 2020-10-29 MED ORDER — LEVOTHYROXINE SODIUM 88 MCG PO TABS
88.0000 ug | ORAL_TABLET | Freq: Every day | ORAL | Status: DC
  Administered 2020-10-30 – 2020-10-31 (×2): 88 ug via ORAL
  Filled 2020-10-29 (×3): qty 1

## 2020-10-29 MED ORDER — MIDAZOLAM HCL 2 MG/2ML IJ SOLN
INTRAMUSCULAR | Status: DC | PRN
  Administered 2020-10-29: 2 mg via INTRAVENOUS

## 2020-10-29 MED ORDER — CHLORHEXIDINE GLUCONATE 0.12 % MT SOLN
OROMUCOSAL | Status: AC
  Administered 2020-10-29: 15 mL via OROMUCOSAL
  Filled 2020-10-29: qty 15

## 2020-10-29 MED ORDER — FENTANYL CITRATE (PF) 100 MCG/2ML IJ SOLN
INTRAMUSCULAR | Status: DC | PRN
  Administered 2020-10-29 (×2): 50 ug via INTRAVENOUS

## 2020-10-29 MED ORDER — SUGAMMADEX SODIUM 200 MG/2ML IV SOLN
INTRAVENOUS | Status: DC | PRN
  Administered 2020-10-29: 100 mg via INTRAVENOUS

## 2020-10-29 MED ORDER — POTASSIUM CHLORIDE CRYS ER 20 MEQ PO TBCR
20.0000 meq | EXTENDED_RELEASE_TABLET | Freq: Two times a day (BID) | ORAL | Status: DC
  Administered 2020-10-30 – 2020-10-31 (×3): 20 meq via ORAL
  Filled 2020-10-29 (×3): qty 1

## 2020-10-29 MED ORDER — ONDANSETRON HCL 4 MG/2ML IJ SOLN
INTRAMUSCULAR | Status: AC
  Filled 2020-10-29: qty 2

## 2020-10-29 MED ORDER — MIDAZOLAM HCL 2 MG/2ML IJ SOLN
INTRAMUSCULAR | Status: AC
  Filled 2020-10-29: qty 2

## 2020-10-29 MED ORDER — CHLORHEXIDINE GLUCONATE 0.12 % MT SOLN: 15.0000 mL | Freq: Once | OROMUCOSAL | Status: AC

## 2020-10-29 MED ORDER — DEXAMETHASONE SODIUM PHOSPHATE 10 MG/ML IJ SOLN
INTRAMUSCULAR | Status: AC
  Filled 2020-10-29: qty 1

## 2020-10-29 MED ORDER — LIDOCAINE HCL (CARDIAC) PF 100 MG/5ML IV SOSY
PREFILLED_SYRINGE | INTRAVENOUS | Status: DC | PRN
  Administered 2020-10-29: 100 mg via INTRAVENOUS

## 2020-10-29 MED ORDER — HEPARIN SODIUM (PORCINE) 5000 UNIT/ML IJ SOLN
INTRAMUSCULAR | Status: AC
  Administered 2020-10-29: 5000 [IU] via SUBCUTANEOUS
  Filled 2020-10-29: qty 1

## 2020-10-29 MED ORDER — BENZTROPINE MESYLATE 0.5 MG PO TABS
0.5000 mg | ORAL_TABLET | Freq: Two times a day (BID) | ORAL | Status: DC
Start: 2020-10-29 — End: 2020-11-01
  Administered 2020-10-29 – 2020-10-31 (×4): 0.5 mg via ORAL
  Filled 2020-10-29 (×5): qty 1

## 2020-10-29 MED ORDER — GABAPENTIN 300 MG PO CAPS: 300.0000 mg | ORAL_CAPSULE | ORAL | Status: AC

## 2020-10-29 MED ORDER — CEFAZOLIN SODIUM-DEXTROSE 2-4 GM/100ML-% IV SOLN
2.0000 g | INTRAVENOUS | Status: AC
  Administered 2020-10-29: 2 g via INTRAVENOUS

## 2020-10-29 MED ORDER — EPHEDRINE 5 MG/ML INJ
INTRAVENOUS | Status: AC
  Filled 2020-10-29: qty 10

## 2020-10-29 MED ORDER — GABAPENTIN 300 MG PO CAPS
ORAL_CAPSULE | ORAL | Status: AC
  Administered 2020-10-29: 300 mg via ORAL
  Filled 2020-10-29: qty 1

## 2020-10-29 MED ORDER — PHENYLEPHRINE HCL (PRESSORS) 10 MG/ML IV SOLN
INTRAVENOUS | Status: DC | PRN
  Administered 2020-10-29: 100 ug via INTRAVENOUS
  Administered 2020-10-29: 200 ug via INTRAVENOUS
  Administered 2020-10-29 (×2): 100 ug via INTRAVENOUS

## 2020-10-29 MED ORDER — PROPOFOL 10 MG/ML IV BOLUS
INTRAVENOUS | Status: DC | PRN
  Administered 2020-10-29: 170 mg via INTRAVENOUS

## 2020-10-29 MED ORDER — GABAPENTIN 300 MG PO CAPS
300.0000 mg | ORAL_CAPSULE | Freq: Two times a day (BID) | ORAL | Status: DC
  Administered 2020-10-29 – 2020-10-31 (×4): 300 mg via ORAL
  Filled 2020-10-29 (×2): qty 1
  Filled 2020-10-29: qty 3
  Filled 2020-10-29: qty 1
  Filled 2020-10-29: qty 3
  Filled 2020-10-29: qty 1

## 2020-10-29 MED ORDER — MORPHINE SULFATE (PF) 4 MG/ML IV SOLN: 4.0000 mg | INTRAVENOUS | Status: DC | PRN

## 2020-10-29 MED ORDER — EPHEDRINE SULFATE 50 MG/ML IJ SOLN
INTRAMUSCULAR | Status: DC | PRN
  Administered 2020-10-29: 5 mg via INTRAVENOUS

## 2020-10-29 SURGICAL SUPPLY — 75 items
APPLIER CLIP 11 MED OPEN (CLIP)
APPLIER CLIP 9.375 SM OPEN (CLIP)
BAG DECANTER FOR FLEXI CONT (MISCELLANEOUS) IMPLANT
BINDER BREAST LRG (GAUZE/BANDAGES/DRESSINGS) ×4 IMPLANT
BIOPATCH WHT 1IN DISK W/4.0 H (GAUZE/BANDAGES/DRESSINGS) IMPLANT
BLADE BOVIE TIP EXT 4 (BLADE) IMPLANT
BLADE PHOTON ILLUMINATED (MISCELLANEOUS) ×4 IMPLANT
BLADE SURG 15 STRL LF DISP TIS (BLADE) ×4 IMPLANT
BLADE SURG 15 STRL SS (BLADE) ×4
BULB RESERV EVAC DRAIN JP 100C (MISCELLANEOUS) ×4 IMPLANT
CHLORAPREP W/TINT 26 (MISCELLANEOUS) ×4 IMPLANT
CLIP APPLIE 11 MED OPEN (CLIP) IMPLANT
CLIP APPLIE 9.375 SM OPEN (CLIP) IMPLANT
CNTNR SPEC 2.5X3XGRAD LEK (MISCELLANEOUS)
CONT SPEC 4OZ STER OR WHT (MISCELLANEOUS)
CONTAINER SPEC 2.5X3XGRAD LEK (MISCELLANEOUS) IMPLANT
COVER MAYO STAND REUSABLE (DRAPES) ×4 IMPLANT
COVER WAND RF STERILE (DRAPES) ×4 IMPLANT
DERMABOND ADVANCED (GAUZE/BANDAGES/DRESSINGS) ×4
DERMABOND ADVANCED .7 DNX12 (GAUZE/BANDAGES/DRESSINGS) ×4 IMPLANT
DEVICE DUBIN SPECIMEN MAMMOGRA (MISCELLANEOUS) IMPLANT
DRAIN CHANNEL JP 19F (MISCELLANEOUS) ×4 IMPLANT
DRAPE LAPAROTOMY TRNSV 106X77 (MISCELLANEOUS) ×4 IMPLANT
DRSG GAUZE FLUFF 36X18 (GAUZE/BANDAGES/DRESSINGS) ×4 IMPLANT
DRSG OPSITE POSTOP 4X12 (GAUZE/BANDAGES/DRESSINGS) ×4 IMPLANT
ELECT CAUTERY BLADE 6.4 (BLADE) ×4 IMPLANT
ELECT CAUTERY BLADE TIP 2.5 (TIP)
ELECT REM PT RETURN 9FT ADLT (ELECTROSURGICAL) ×4
ELECTRODE CAUTERY BLDE TIP 2.5 (TIP) IMPLANT
ELECTRODE REM PT RTRN 9FT ADLT (ELECTROSURGICAL) ×2 IMPLANT
GAUZE SPONGE 4X4 12PLY STRL (GAUZE/BANDAGES/DRESSINGS) IMPLANT
GLOVE SURG ENC MOIS LTX SZ6.5 (GLOVE) ×16 IMPLANT
GLOVE SURG UNDER POLY LF SZ6.5 (GLOVE) ×4 IMPLANT
GOWN STRL REUS W/ TWL LRG LVL3 (GOWN DISPOSABLE) ×10 IMPLANT
GOWN STRL REUS W/TWL LRG LVL3 (GOWN DISPOSABLE) ×10
JACKSON PRATT 10 (INSTRUMENTS) IMPLANT
KIT TURNOVER KIT A (KITS) ×4 IMPLANT
LABEL OR SOLS (LABEL) ×4 IMPLANT
MANIFOLD NEPTUNE II (INSTRUMENTS) ×4 IMPLANT
NEEDLE FILTER BLUNT 18X 1/2SAF (NEEDLE)
NEEDLE FILTER BLUNT 18X1 1/2 (NEEDLE) IMPLANT
NEEDLE HYPO 22GX1.5 SAFETY (NEEDLE) ×4 IMPLANT
PACK BASIN MAJOR ARMC (MISCELLANEOUS) ×4 IMPLANT
PACK UNIVERSAL (MISCELLANEOUS) IMPLANT
PAD ABD DERMACEA PRESS 5X9 (GAUZE/BANDAGES/DRESSINGS) IMPLANT
PIN SAFETY STRL (MISCELLANEOUS) ×4 IMPLANT
SLEVE PROBE SENORX GAMMA FIND (MISCELLANEOUS) IMPLANT
SOL PREP PVP 2OZ (MISCELLANEOUS)
SOLUTION PREP PVP 2OZ (MISCELLANEOUS) IMPLANT
SPONGE LAP 18X18 RF (DISPOSABLE) ×8 IMPLANT
SUT ETHILON 3-0 FS-10 30 BLK (SUTURE) ×4
SUT MNCRL 3-0 UNDYED SH (SUTURE) ×6 IMPLANT
SUT MNCRL 4-0 (SUTURE) ×4
SUT MNCRL 4-0 27XMFL (SUTURE) ×4
SUT MNCRL+ 5-0 UNDYED PC-3 (SUTURE) IMPLANT
SUT MONOCRYL 3-0 UNDYED (SUTURE) ×6
SUT MONOCRYL 5-0 (SUTURE)
SUT PDS 3-0 (SUTURE) ×2
SUT PDS PLUS 2 (SUTURE)
SUT PDS PLUS AB 2-0 CT-1 (SUTURE) IMPLANT
SUT PDS PLUS AB 3-0 PC-5 (SUTURE) ×2 IMPLANT
SUT PROLENE 6 0 BV (SUTURE) ×4 IMPLANT
SUT SILK 2 0 SH (SUTURE) ×4 IMPLANT
SUT SILK 3-0 (SUTURE) IMPLANT
SUT SILK 4 0 SH (SUTURE) IMPLANT
SUT VIC AB 3-0 SH 27 (SUTURE) ×2
SUT VIC AB 3-0 SH 27X BRD (SUTURE) ×2 IMPLANT
SUT VIC AB 3-0 SH 8-18 (SUTURE) IMPLANT
SUT VICRYL+ 3-0 144IN (SUTURE) ×4 IMPLANT
SUTURE EHLN 3-0 FS-10 30 BLK (SUTURE) ×2 IMPLANT
SUTURE MNCRL 4-0 27XMF (SUTURE) ×4 IMPLANT
SYR 10ML LL (SYRINGE) ×4 IMPLANT
SYR BULB IRRIG 60ML STRL (SYRINGE) IMPLANT
TOWEL OR 17X26 4PK STRL BLUE (TOWEL DISPOSABLE) ×4 IMPLANT
WATER STERILE IRR 1000ML POUR (IV SOLUTION) ×4 IMPLANT

## 2020-10-29 NOTE — Plan of Care (Signed)
Pt. Post surgery. Orient and alert. No sign of respiratory distress. Pt. Deny any pain/discomfort. Continue to monitor

## 2020-10-29 NOTE — Anesthesia Postprocedure Evaluation (Signed)
Anesthesia Post Note  Patient: Tamara [name]  Procedure(s) Performed: TOTAL MASTECTOMY --- RADICAL (Left Breast) POSSIBLE ADJACENT TISSUE TRANSFER, TISSUE REARRANGEMENT (N/A ) POSSIBLE APPLICATION OF A-CELL OF CHEST/ABDOMEN (N/A ) POSSIBLE APPLICATION OF WOUND VAC (N/A )  Patient location during evaluation: PACU Anesthesia Type: General Level of consciousness: awake and alert Pain management: pain level controlled Vital Signs Assessment: post-procedure vital signs reviewed and stable Respiratory status: spontaneous breathing, nonlabored ventilation, respiratory function stable and patient connected to nasal cannula oxygen Cardiovascular status: blood pressure returned to baseline and stable Postop Assessment: no apparent nausea or vomiting Anesthetic complications: no   No complications documented.   Last Vitals:  Vitals:   10/29/20 1715 10/29/20 1728  BP: 109/60 115/66  Pulse: 84 86  Resp: 15 15  Temp:  (!) 36.2 C  SpO2: 100% 100%    Last Pain:  Vitals:   10/29/20 1728  TempSrc:   PainSc: 0-No pain                 Arita Miss

## 2020-10-29 NOTE — Op Note (Signed)
Preoperative diagnosis: Carcinoma of the left breast.   Postoperative diagnosis: Carcinoma of the left breast.  Procedure: Left modified radical mastectomy with partial excision of pectoralis muscle.  Anesthesia: GETA  Surgeon: Dr. Windell Moment  Wound Classification: Clean   Indications: Patient is a 50 y.o. 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 She was demonstrated to have axillary node involvement. Patient started on neoadjuvant chemotherapy but did tolerated completion of the course. After discussion of alternatives, the patient would benefit best of modified radical mastectomy as oncologic treatment.   Findings: 1. Mas was not fixed to pectoralis muscle. Partial excision of the pectoralis muscle was done to increase chances of negative margins.  2. With assistant of Plastic and reconstructive surgery, wound able to be closed primarily.    Description of procedure: The patient was brought to the operating room and the site of surgery was confirmed. General anesthesia was induced. A time-out was completed verifying correct patient, procedure, site, positioning, and implant(s) and/or special equipment prior to beginning this procedure. The breast, chest wall, axilla, and upper arm and neck were prepped and draped in the usual sterile fashion.  A wide skin incision was made that encompassed the nipple-areola complex and the previous biopsy scar and passed in a generally oblique direction across the breast. Flaps were raised in the avascular plane between subcutaneous tissue and breast tissue from clavicle superiorly, the sternum medially, the anterior rectus sheath inferiorly, and the anterior border of the latissimus dorsi muscle laterally. Hemostasis was achieved in the flaps. Next, the breast tissue and underlying pectoralis fascia were excised from the pectoralis major muscle,  progressing from medially to laterally. Under the mass, the pectoralis muscle was partially excised to have a wider margin.  At the lateral border of the pectoralis major muscle, the breast tissue was swung laterally and dissection progressed under the muscle.  The clavipectoral fascia was then incised along the edge of the pectoralis major and the pectoralis major and minor were freed from surrounding fat and nodal tissue. Dissection progressed first under the pectoralis major and then under the pectoralis minor muscle. The pectoral muscles were retracted medially with a Richardson retractor. The medial pectoral neurovascular bundle was identified and preserved. The level II nodal tissue deep in the pectoralis minor was included in the dissection. The axillary vein was then identified and cleared of overlying fat. The first branch off the axillary vein was clamped, divided, and tied with 2-0 silk ties. The thoracodorsal nerve was then identified deep to the ligated vein and preserved. The long thoracic nerve was then identified along the edge of the latissimus dorsi on the chest wall and preserved. The remaining nodal tissue between these nerves was then carefully removed, taking care to protect the nerves. The specimen, consisting of breast and attached nodal tissue, was then excised by dividing the remaining lateral pedical and sent to pathology.  The wound was irrigated and hemostasis was achieved.   Plastic and reconstructive surgery assisted in the closure of the wide wound. See Dr. Marla Roe report for details.   The patient tolerated the procedure well and was taken to the postanesthesia care unit in stable condition.   Sentinel Node Biopsy Synoptic Operative Report  Operation performed with curative intent:Yes  Tracer(s) used to identify sentinel nodes in the upfront surgery (non-neoadjuvant) setting (select all that apply):N/A  Tracer(s) used to identify sentinel nodes in  the neoadjuvant  setting (select all that apply):N/A  All nodes (colored or non-colored) present at the end of a dye-filled lymphatic channel were removed:N/A  All significantly radioactive nodes were removed:N/A  All palpable suspicious nodes were removed:Yes  Biopsy-proven positive nodes marked with clips prior to chemotherapy were identified and removed:N/A  Specimen: Left Breast                     Additional axillary tissue  Complications: None  Estimated Blood Loss: 100 mL

## 2020-10-29 NOTE — Op Note (Signed)
DATE OF OPERATION: 10/29/2020  LOCATION: [facility]  PREOPERATIVE DIAGNOSIS: left breast cancer  POSTOPERATIVE DIAGNOSIS: Same  PROCEDURE: Closure of left breast 20 cm  SURGEON: Dr. Windell Moment  ASSISTANT SURGEON:  Lyndee Leo Sanger Tasheena Wambolt, DO EBL: nil cc  CONDITION: Stable  COMPLICATIONS: None  INDICATION: The patient, Tamara [name], is a 50 y.o. female born on November 03, 1970, is here for treatment of left breast cancer.   PROCEDURE DETAILS:  The patient was taken to the OR with general surgery.  The IV antibiotics were given.  A general anesthetic was given. A standard time out was performed and all information was confirmed by those in the room. SCDs were placed.  The patient underwent the left breast mastectomy.  The skin edges were tight but able to come together.  Deep sutures were placed with the 2-0 PDS. Additional 3-0 Monocryl were placed deep.  The 4-0 Monocryl was placed as a running subcuticular suture.  Additional 4-0 Monocryl was placed as vertical mattress sutures. Derma bond was applied and a sterile dressing.  The patient was allowed to wake up and taken to recovery room in stable condition at the end of the case. The family was notified at the end of the case.

## 2020-10-29 NOTE — Anesthesia Preprocedure Evaluation (Addendum)
Anesthesia Evaluation  Patient identified by MRN, date of birth, ID band Patient awake    Reviewed: Allergy & Precautions, NPO status , [name] Chart, lab work & pertinent test results  History of Anesthesia Complications Negative for: history of anesthetic complications  Airway Mallampati: II       Dental  (+) Chipped, Poor Dentition   Pulmonary neg sleep apnea, neg COPD, Not current smoker,           Cardiovascular (-) hypertension(-) Past MI and (-) CHF (-) dysrhythmias (-) Valvular Problems/Murmurs     Neuro/Psych neg Seizures PSYCHIATRIC DISORDERS (mild intellectual disability) Schizophrenia    GI/Hepatic Neg liver ROS, GERD  Medicated,  Endo/Other  neg diabetesHypothyroidism   Renal/GU negative Renal ROS     Musculoskeletal   Abdominal   Peds  Hematology   Anesthesia Other Findings   Reproductive/Obstetrics                             Anesthesia Physical Anesthesia Plan  ASA: III  Anesthesia Plan: General   Post-op Pain Management:    Induction: Intravenous  PONV Risk Score and Plan: 3 and Ondansetron and Dexamethasone  Airway Management Planned: Oral ETT  Additional Equipment:   Intra-op Plan:   Post-operative Plan:   Informed Consent: I have reviewed the patients History and Physical, chart, labs and discussed the procedure including the risks, benefits and alternatives for the proposed anesthesia with the patient or authorized representative who has indicated his/her understanding and acceptance.       Plan Discussed with:   Anesthesia Plan Comments:        Anesthesia Quick Evaluation

## 2020-10-29 NOTE — H&P (Signed)
History of Present Illness Tamara [name] is a 50 y.o. female presented today to the OR for surgical management of left breast cancer.  Patient with a large left breast mass that has been receiving neoadjuvant chemotherapy.  Patient did not tolerated completion of neoadjuvant chemotherapy.  Due to concern of progression chemotherapy was placed on hold to proceed with surgical management.  Past Medical History Past Medical History:  Diagnosis Date  . Breast cancer ([place]) 03/2020   left  . Family history of breast cancer   . Family history of stomach cancer   . Family history of stomach cancer   . GERD (gastroesophageal reflux disease)   . Hypothyroidism   . IUD (intrauterine device) in place IUD was placed in 2016  . Leiomyoma of uterus   . Schizophrenia ([place])   . Thyroid disease        Past Surgical History:  Procedure Laterality Date  . BREAST BIOPSY Left 03/2020   [facility]- Triple neg- Stage 3  . PORTACATH PLACEMENT Right 05/02/2020   Procedure: INSERTION PORT-A-CATH;  Surgeon: Herbert Pun, MD;  Location: [facility] ORS;  Service: General;  Laterality: Right;    Allergies  Allergen Reactions  . Paclitaxel Anaphylaxis    Hypersensitivity reaction [flushing; shortness of breath; drop in blood pressure; tachycardia]    Current Facility-Administered Medications  Medication Dose Route Frequency Provider Last Rate Last Admin  . bupivacaine liposome (EXPAREL) 1.3 % injection 266 mg  20 mL Infiltration Once Herbert Pun, MD      . ceFAZolin (ANCEF) 2-4 GM/100ML-% IVPB           . ceFAZolin (ANCEF) IVPB 2g/100 mL premix  2 g Intravenous On Call to OR Herbert Pun, MD      . lactated ringers infusion   Intravenous Continuous [place], Amy, MD 10 mL/hr at 10/29/20 1105 New Bag at 10/29/20 1105   Facility-Administered Medications Ordered in Other Encounters  Medication Dose Route Frequency Provider Last Rate Last Admin  . sodium chloride flush (NS) 0.9 % injection  10 mL  10 mL Intravenous PRN Cammie Sickle, MD   10 mL at 06/14/20 0840    Family History Family History  Problem Relation Age of Onset  . Breast cancer Mother   . Stomach cancer Maternal Grandfather   . Other Maternal Aunt        died from tumor, brain?       Social History Social History   Tobacco Use  . Smoking status: Never Smoker  . Smokeless tobacco: Never Used  Vaping Use  . Vaping Use: Never used  Substance Use Topics  . Alcohol use: No  . Drug use: No       ROS Full ROS of systems performed and is otherwise negative there than what is stated in the HPI  Physical Exam Blood pressure 124/71, pulse 93, temperature (!) 97.2 F (36.2 C), temperature source Oral, resp. rate 14, height 5\' 2"  (1.575 m), weight 65.8 kg, SpO2 100 %.  CONSTITUTIONAL: Alert, oriented, cooperative. EYES: Pupils equal, round, and reactive to light, Sclera non-icteric. EARS, NOSE, MOUTH AND THROAT: The oropharynx is clear. Oral mucosa is pink and moist. Hearing is intact to voice.  NECK: Trachea is midline, and there is no jugular venous distension. Thyroid is without palpable abnormalities. LYMPH NODES:  Lymph nodes in the neck are not enlarged. RESPIRATORY:  Lungs are clear, and breath sounds are equal bilaterally. Normal respiratory effort without pathologic use of accessory muscles. CARDIOVASCULAR: Heart is regular  without murmurs, gallops, or rubs. BREAST: Large left breast palpable mass with clinical palpable axillary adenopathy.  Skin or medical discoloration.  Mobile. GI: The abdomen is soft, nontender, and nondistended. There were no palpable masses. There was no hepatosplenomegaly. There were normal bowel sounds. GU: Deferred MUSCULOSKELETAL:  Normal muscle strength and tone in all four extremities.    SKIN: Skin turgor is normal. There are no pathologic skin lesions.  NEUROLOGIC:  Motor and sensation is grossly normal.  Cranial nerves are grossly intact. PSYCH:  Alert and  oriented to person, place and time. Affect is normal.  Data Reviewed Patient COVID-negative.  New hemoglobin 10.  Electrolyte within normal limits.  I have personally reviewed the patient's imaging and medical records.    Assessment and plan:  Left breast cancer T4 N2 M0-stage III triple negative    Left breast radical mastectomy Plastic surgery will be assisting in complex closure of the wound.   Herbert Pun, MD  Herbert Pun 10/29/2020, 11:45 AM

## 2020-10-29 NOTE — Anesthesia Procedure Notes (Signed)
Procedure Name: Intubation Date/Time: 10/29/2020 1:45 PM Performed by: Debe Coder, CRNA Pre-anesthesia Checklist: Patient identified, Emergency Drugs available, Suction available and Patient being monitored Patient Re-evaluated:Patient Re-evaluated prior to induction Oxygen Delivery Method: Circle system utilized Preoxygenation: Pre-oxygenation with 100% oxygen Induction Type: IV induction Ventilation: Mask ventilation without difficulty Laryngoscope Size: Mac and 4 Grade View: Grade I Tube type: Oral Tube size: 7.0 mm Number of attempts: 1 Airway Equipment and Method: Stylet Placement Confirmation: ETT inserted through vocal cords under direct vision,  positive ETCO2 and breath sounds checked- equal and bilateral Tube secured with: Tape Dental Injury: Teeth and Oropharynx as per pre-operative assessment

## 2020-10-29 NOTE — Interval H&P Note (Signed)
History and Physical Interval Note:  10/29/2020 2:16 PM  Tamara [name]  has presented today for surgery, with the diagnosis of C50.812, Z17.1 Malignant neoplasm of overlapping sites of lt breast in female, estrogen receptor negative.  The various methods of treatment have been discussed with the patient and family. After consideration of risks, benefits and other options for treatment, the patient has consented to  Procedure(s) with comments: TOTAL MASTECTOMY --- RADICAL (Left) - needs 3 hrs POSSIBLE ADJACENT TISSUE TRANSFER, TISSUE REARRANGEMENT (N/A) POSSIBLE APPLICATION OF A-CELL OF CHEST/ABDOMEN (N/A) POSSIBLE APPLICATION OF WOUND VAC (N/A) as a surgical intervention.  The patient's history has been reviewed, patient examined, no change in status, stable for surgery.  I have reviewed the patient's chart and labs.  Questions were answered to the patient's satisfaction.     [name]

## 2020-10-29 NOTE — Transfer of Care (Signed)
Immediate Anesthesia Transfer of Care Note  Patient: Tamara [name]  Procedure(s) Performed: TOTAL MASTECTOMY --- RADICAL (Left Breast) POSSIBLE ADJACENT TISSUE TRANSFER, TISSUE REARRANGEMENT (N/A ) POSSIBLE APPLICATION OF A-CELL OF CHEST/ABDOMEN (N/A ) POSSIBLE APPLICATION OF WOUND VAC (N/A )  Patient Location: PACU  Anesthesia Type:General  Level of Consciousness: drowsy and patient cooperative  Airway & Oxygen Therapy: Patient Spontanous Breathing  Post-op Assessment: Report given to RN and Post -op Vital signs reviewed and stable  Post vital signs: Reviewed and stable  Last Vitals:  Vitals Value Taken Time  BP 96/52 10/29/20 1647  Temp 36.2 C 10/29/20 1647  Pulse 82 10/29/20 1656  Resp 16 10/29/20 1656  SpO2 100 % 10/29/20 1656  Vitals shown include unvalidated device data.  Last Pain:  Vitals:   10/29/20 1647  TempSrc:   PainSc: 0-No pain         Complications: No complications documented.

## 2020-10-30 ENCOUNTER — Encounter: Payer: Self-pay | Admitting: General Surgery

## 2020-10-30 ENCOUNTER — Encounter: Payer: Self-pay | Admitting: *Deleted

## 2020-10-30 DIAGNOSIS — Z803 Family history of malignant neoplasm of breast: Secondary | ICD-10-CM | POA: Diagnosis not present

## 2020-10-30 DIAGNOSIS — Z79899 Other long term (current) drug therapy: Secondary | ICD-10-CM | POA: Diagnosis not present

## 2020-10-30 DIAGNOSIS — F70 Mild intellectual disabilities: Secondary | ICD-10-CM | POA: Diagnosis present

## 2020-10-30 DIAGNOSIS — Z79890 Hormone replacement therapy (postmenopausal): Secondary | ICD-10-CM | POA: Diagnosis not present

## 2020-10-30 DIAGNOSIS — Z171 Estrogen receptor negative status [ER-]: Secondary | ICD-10-CM | POA: Diagnosis not present

## 2020-10-30 DIAGNOSIS — Z20822 Contact with and (suspected) exposure to covid-19: Secondary | ICD-10-CM | POA: Diagnosis present

## 2020-10-30 DIAGNOSIS — K219 Gastro-esophageal reflux disease without esophagitis: Secondary | ICD-10-CM | POA: Diagnosis present

## 2020-10-30 DIAGNOSIS — E871 Hypo-osmolality and hyponatremia: Secondary | ICD-10-CM | POA: Diagnosis present

## 2020-10-30 DIAGNOSIS — E039 Hypothyroidism, unspecified: Secondary | ICD-10-CM | POA: Diagnosis present

## 2020-10-30 DIAGNOSIS — F209 Schizophrenia, unspecified: Secondary | ICD-10-CM | POA: Diagnosis present

## 2020-10-30 DIAGNOSIS — Z888 Allergy status to other drugs, medicaments and biological substances status: Secondary | ICD-10-CM | POA: Diagnosis not present

## 2020-10-30 DIAGNOSIS — Z800 Family history of malignant neoplasm of digestive organs: Secondary | ICD-10-CM | POA: Diagnosis not present

## 2020-10-30 DIAGNOSIS — C773 Secondary and unspecified malignant neoplasm of axilla and upper limb lymph nodes: Secondary | ICD-10-CM | POA: Diagnosis present

## 2020-10-30 DIAGNOSIS — C50812 Malignant neoplasm of overlapping sites of left female breast: Secondary | ICD-10-CM | POA: Diagnosis present

## 2020-10-30 LAB — BASIC METABOLIC PANEL
Anion gap: 7 (ref 5–15)
BUN: <5 mg/dL — ABNORMAL LOW (ref 6–20)
CO2: 24 mmol/L (ref 22–32)
Calcium: 9 mg/dL (ref 8.9–10.3)
Chloride: 98 mmol/L (ref 98–111)
Creatinine, Ser: 0.64 mg/dL (ref 0.44–1.00)
GFR, Estimated: >60 mL/min (ref >60)
Glucose, Bld: 93 mg/dL (ref 70–99)
Potassium: 3.6 mmol/L (ref 3.5–5.1)
Sodium: 129 mmol/L — ABNORMAL LOW (ref 135–145)

## 2020-10-30 LAB — CBC
HCT: 26.1 % — ABNORMAL LOW (ref 36.0–46.0)
Hemoglobin: 9.1 g/dL — ABNORMAL LOW (ref 12.0–15.0)
MCH: 34 pg (ref 26.0–34.0)
MCHC: 34.9 g/dL (ref 30.0–36.0)
MCV: 97.4 fL (ref 80.0–100.0)
Platelets: 169 10*3/uL (ref 150–400)
RBC: 2.68 MIL/uL — ABNORMAL LOW (ref 3.87–5.11)
RDW: 15.7 % — ABNORMAL HIGH (ref 11.5–15.5)
WBC: 9.4 10*3/uL (ref 4.0–10.5)
nRBC: 0 % (ref 0.0–0.2)

## 2020-10-30 MED ORDER — SODIUM CHLORIDE 1 G PO TABS
1.0000 g | ORAL_TABLET | Freq: Three times a day (TID) | ORAL | Status: DC
  Administered 2020-10-30 – 2020-10-31 (×3): 1 g via ORAL
  Filled 2020-10-30 (×6): qty 1

## 2020-10-30 MED ORDER — CHLORHEXIDINE GLUCONATE CLOTH 2 % EX PADS
6.0000 | MEDICATED_PAD | Freq: Every day | CUTANEOUS | Status: DC
  Administered 2020-10-31 (×2): 6 via TOPICAL

## 2020-10-30 MED ORDER — PANTOPRAZOLE SODIUM 40 MG PO TBEC
40.0000 mg | DELAYED_RELEASE_TABLET | Freq: Every day | ORAL | Status: DC
  Administered 2020-10-30: 40 mg via ORAL
  Filled 2020-10-30: qty 1

## 2020-10-30 NOTE — Progress Notes (Signed)
JP drain teaching was performed with pt and her sister Earline Mayotte as how to empty and return back to bulb suction. Also instructed sister on the importance of recording output and the monitoring of site for s/s of infection.

## 2020-10-30 NOTE — Progress Notes (Signed)
PHARMACIST - PHYSICIAN COMMUNICATION  DR:   Windell Moment  CONCERNING: IV to Oral Route Change Policy  RECOMMENDATION: This patient is receiving protonix by the intravenous route.  Based on criteria approved by the Pharmacy and Therapeutics Committee, the intravenous medication(s) is/are being converted to the equivalent oral dose form(s).   DESCRIPTION: These criteria include:  The patient is eating (either orally or via tube) and/or has been taking other orally administered medications for a least 24 hours  The patient has no evidence of active gastrointestinal bleeding or impaired GI absorption (gastrectomy, short bowel, patient on TNA or NPO).  If you have questions about this conversion, please contact the Pharmacy Department  []   [phone] )  [facility] [x]   [phone] )  [facility] []   [phone] )  [facility] []   [phone] )  [facility] []   [phone] )  [place], PharmD, BCPS Clinical Pharmacist 10/30/2020 7:25 AM

## 2020-10-30 NOTE — Progress Notes (Addendum)
Patient ID: Tamara [name], female   DOB: 1970-08-08, 50 y.o.   MRN: [id number]     [facility] Day(s): 0.   Post op day(s): 1 Day Post-Op.   Interval History: Patient seen and examined, no acute events or new complaints overnight. Patient reports feeling good. Denies any pain.   Vital signs in last 24 hours: [min-max] current  Temp:  [97.1 F (36.2 C)-98.8 F (37.1 C)] 98.5 F (36.9 C) (06/07 1138) Pulse Rate:  [78-109] 109 (06/07 1138) Resp:  [15-18] 18 (06/07 1138) BP: (96-133)/(52-86) 118/86 (06/07 1138) SpO2:  [98 %-100 %] 100 % (06/07 1138)     Height: 5\' 2"  (157.5 cm) Weight: 65.8 kg BMI (Calculated): 26.53   Physical Exam:  Constitutional: alert, cooperative and no distress  Chest: wound with viable flaps. No sign of ischemia or necrosis. No drainage. Drain in place with bloody output.   Labs:  CBC Latest Ref Rng & Units 10/30/2020 10/29/2020 10/16/2020  WBC 4.0 - 10.5 K/uL 9.4 - 4.3  Hemoglobin 12.0 - 15.0 g/dL 9.1(L) 10.7(L) 9.7(L)  Hematocrit 36.0 - 46.0 % 26.1(L) 30.1(L) 27.2(L)  Platelets 150 - 400 K/uL 169 - 203   CMP Latest Ref Rng & Units 10/30/2020 10/29/2020 10/16/2020  Glucose 70 - 99 mg/dL 93 78 139(H)  BUN 6 - 20 mg/dL <5(L) <5(L) <5(L)  Creatinine 0.44 - 1.00 mg/dL 0.64 0.64 0.54  Sodium 135 - 145 mmol/L 129(L) 132(L) 125(L)  Potassium 3.5 - 5.1 mmol/L 3.6 4.0 3.3(L)  Chloride 98 - 111 mmol/L 98 98 92(L)  CO2 22 - 32 mmol/L 24 22 21(L)  Calcium 8.9 - 10.3 mg/dL 9.0 9.6 8.8(L)  Total Protein 6.5 - 8.1 g/dL - - -  Total Bilirubin 0.3 - 1.2 mg/dL - - -  Alkaline Phos 38 - 126 U/L - - -  AST 15 - 41 U/L - - -  ALT 0 - 44 U/L - - -    Imaging studies: No new pertinent imaging studies   Assessment/Plan:  50 y.o. female with triple negative large breast cancer 1 Day Post-Op s/p modified radical mastectomy, complicated by pertinent comorbidities including schizophrenia.  This patient after big surgery, modified radical mastectomy. She  needs drain and wound care. Patient is partially dependable due to her schizophrenia. Mother has been taking care of her and is very cooperative but this might be more that she can handle. Will try to coordinate Home health for assistance of wound and drain care. Will continue educating the mother to see if she can help in the care of this patient.   Arnold Long, MD

## 2020-10-30 NOTE — TOC Transition Note (Addendum)
Transition of Care [facility]) - CM/SW Discharge Note   Patient Details  Name: Tamara [name] MRN: [id number] Date of Birth: Oct 12, 1970  [facility] of [facility]) CM/SW Contact:  Kerin Salen, RN Phone Number: 10/30/2020, 1:08 PM   Clinical Narrative:  Patient scheduled for discharge today with Lt Breast drain that requires assistance from [name]. Attempted to call several agencies, all were unable to service patient. Attending to complete external referral form, will fax to [facility] for processing. I also called [facility] and left message for follow up guidance.   Called and spoke with patients sister Tamara [name], who says she will come in about an hour and learn how to empty and care for the drain. She will also try to bring her mother, however she will come alone if unable to get in touch with Mother. [name] agrees to being in charge of taking care of the drain.          Patient Goals and CMS Choice        Discharge Placement                       Discharge Plan and Services                                     Social Determinants of Health (SDOH) Interventions     Readmission Risk Interventions No flowsheet data found.

## 2020-10-30 NOTE — Progress Notes (Signed)
Received message from [place], our triage nurse in regards to patient's case worker wanting assistance.  Called [facility].  She states she is not able to get home health to come out and empty patienta post surgical drain.  I suggested that she contact Dr. Deniece Ree office, since he was her surgeon,  to see if an outpatient visit would be possible to maintain the drain.  Number given to his office.

## 2020-10-31 MED ORDER — HALOPERIDOL DECANOATE 100 MG/ML IM SOLN: 100.0000 mg | INTRAMUSCULAR | Status: DC

## 2020-10-31 MED ORDER — HYDROCODONE-ACETAMINOPHEN 5-325 MG PO TABS: 1.0000 | ORAL_TABLET | ORAL | 0 refills | Status: DC | PRN

## 2020-10-31 MED ORDER — CELECOXIB 200 MG PO CAPS: 200.0000 mg | ORAL_CAPSULE | Freq: Two times a day (BID) | ORAL | 0 refills | Status: AC

## 2020-10-31 NOTE — Progress Notes (Addendum)
Mobility Specialist - Progress Note   10/31/20 1100  Mobility  Activity Ambulated in hall  Range of Motion/Exercises Active  Level of Assistance Independent  Assistive Device None  Distance Ambulated (ft) 200 ft  Mobility Ambulated independently in hallway  Mobility Response Tolerated well  Mobility performed by Mobility specialist  $Mobility charge 1 Mobility    Pt participated in UB/LB dressing prior to ambulation. No physical assistance needed. Pt ambulated in hallway without AD. No LOB. HR did peak at 130 bpm, but no s/s of distress. O2 maintained high 90s. Denied SOB on RA. Denied pain.   Kathee Delton Mobility Specialist 10/31/20, 11:08 AM

## 2020-10-31 NOTE — Discharge Summary (Signed)
Patient ID: ABEL RA MRN: [id number] DOB/AGE: Mar 04, 1971 50 y.o.  Admit date: 10/29/2020 Discharge date: 10/31/2020   Discharge Diagnoses:  Active Problems:   Breast cancer [facility])   Procedures: Left modified radical mastectomy   Hospital Course: Patient underwent left modified radical mastectomy. Tolerated procedure well. Pain controlled. Drain getting serosanguinous. Wound is dry and clean with viable flaps.   Physical Exam HENT:     Head: Normocephalic.  Cardiovascular:     Rate and Rhythm: Normal rate and regular rhythm.     Pulses: Normal pulses.  Pulmonary:     Effort: Pulmonary effort is normal.  Abdominal:     General: Abdomen is flat. Bowel sounds are normal.  Musculoskeletal:     Cervical back: Normal range of motion.  Neurological:     Mental Status: She is alert.   Chest wall with adequate capillary refill. No fluid collection, ischemic tissue or hematoma.    Consults: None  Disposition: Discharge disposition: 01-Home or Self Care       Discharge Instructions    Diet - low sodium heart healthy   Complete by: As directed    Increase activity slowly   Complete by: As directed      Allergies as of 10/31/2020      Reactions   Paclitaxel Anaphylaxis   Hypersensitivity reaction [flushing; shortness of breath; drop in blood pressure; tachycardia]      Medication List    TAKE these medications   benztropine 0.5 MG tablet Commonly known as: COGENTIN Take 1 tablet (0.5 mg total) by mouth 2 (two) times daily.   celecoxib 200 MG capsule Commonly known as: CELEBREX Take 1 capsule (200 mg total) by mouth 2 (two) times daily for 7 days.   cetirizine 10 MG tablet Commonly known as: ZYRTEC Take 1 tablet (10 mg total) by mouth daily as needed for allergies.   clonazePAM 0.5 MG tablet Commonly known as: KLONOPIN Take 0.5 mg by mouth at bedtime.   divalproex 500 MG DR tablet Commonly known as: DEPAKOTE Take 1 tablet (500 mg total) by mouth every  12 (twelve) hours.   fluticasone 50 MCG/ACT nasal spray Commonly known as: Flonase Place 1 spray into both nostrils daily. What changed:   when to take this  reasons to take this   haloperidol 5 MG tablet Commonly known as: HALDOL Take 1 tablet (5 mg total) by mouth 3 (three) times daily.   haloperidol decanoate 100 MG/ML injection Commonly known as: HALDOL DECANOATE Inject 100 mg into the muscle every 28 (twenty-eight) days.   HYDROcodone-acetaminophen 5-325 MG tablet Commonly known as: NORCO/VICODIN Take 1 tablet by mouth every 4 (four) hours as needed for moderate pain.   levothyroxine 88 MCG tablet Commonly known as: SYNTHROID Take 1 tablet (88 mcg total) by mouth daily at 6 (six) AM.   lidocaine-prilocaine cream Commonly known as: EMLA Apply 1 application topically as needed.   medroxyPROGESTERone 150 MG/ML injection Commonly known as: DEPO-PROVERA Inject 150 mg into the muscle every 3 (three) months.   MELATONIN PO Take 1 tablet by mouth at bedtime as needed (sleep).   ondansetron 8 MG tablet Commonly known as: ZOFRAN One pill every 8 hours as needed for nausea/vomitting. What changed:   how much to take  how to take this  when to take this  reasons to take this  additional instructions   potassium chloride SA 20 MEQ tablet Commonly known as: [place]-Con M20 Take 1 tablet (20 mEq total) by mouth 2 (two)  times daily.   prochlorperazine 10 MG tablet Commonly known as: COMPAZINE Take 1 tablet (10 mg total) by mouth every 6 (six) hours as needed for nausea or vomiting.   sodium chloride 1 g tablet Take 1 tablet (1 g total) by mouth 3 (three) times daily.       Follow-up Information    Dillingham, Loel Lofty, DO In 2 weeks.   Specialty: Plastic Surgery Contact information: [address] [place] [address] [phone]        Herbert Pun, MD Follow up in 1 week(s).   Specialty: General Surgery Contact information: [address] [place] [address] [phone]

## 2020-10-31 NOTE — Discharge Instructions (Signed)
  Diet: Resume home heart healthy regular diet.   Activity: No heavy lifting >20 pounds (children, pets, laundry, garbage) or strenuous activity until follow-up, but light activity and walking are encouraged. Do not drive or drink alcohol if taking narcotic pain medications.  Wound care: May shower with soapy water and pat dry (do not rub incisions), but no baths or submerging incision underwater until follow-up. (no swimming). Keep the dressing area dry and clean.   Use chest binder as instructed.   Medications: Resume all home medications. For mild to moderate pain: acetaminophen (Tylenol) or ibuprofen (if no kidney disease). Combining Tylenol with alcohol can substantially increase your risk of causing liver disease. Narcotic pain medications, if prescribed, can be used for severe pain, though may cause nausea, constipation, and drowsiness. Do not combine Tylenol and Norco within a 6 hour period as Norco contains Tylenol. If you do not need the narcotic pain medication, you do not need to fill the prescription.  Call office [phone]) at any time if any questions, worsening pain, fevers/chills, bleeding, drainage from incision site, or other concerns.

## 2020-11-02 ENCOUNTER — Other Ambulatory Visit: Payer: Self-pay | Admitting: Internal Medicine

## 2020-11-02 LAB — SURGICAL PATHOLOGY

## 2020-11-08 NOTE — Progress Notes (Signed)
Patient is a 50 year old female here for follow-up after left modified radical mastectomy with partial excision of pectoralis muscle with Dr. Windell Moment followed by assistance with closure of the left breast incision with Dr. Marla Roe on 10/29/2020.    Patient is here with her mother and sister.  She has a wound of the left breast which was noted by general surgery on 11/06/2020.  She has not been wearing any compressive garments.  She comes in today with no compressive garment in place.  Today on evaluation it appears that the bulb has not been placed on suction and I was able to drain approximately 150 cc of fluid from the left breast after placing the bulb for the JP drain to suction.  She does have a wound of the left breast that is approximately 3 x 0.5 cm.  There is no surrounding erythema.  There is no cellulitic changes.  The rest of the breast incision is intact and healing well.  There is no tenderness with palpation.  Provided patient and family with instructions on how to care for the left breast wound with Xeroform, Vaseline, 4 x 4 gauze.  I recommend they change this daily.  I also provided them with verbal and visual instructions on how to set the JP drain to suction.  All of their questions were answered to their content in regards to this.  I do not see any signs of infection on exam.  I recommend that she continue to wear compressive garment whether that is a breast binder or compression bra 24/7 to help decrease risks of seroma and ongoing fluid collections.  Recommend following up in our office in 2 weeks for reevaluation.  Patient is scheduled to see general surgery next week.  We will allow general surgery to determine timeframe for drain removal.

## 2020-11-09 ENCOUNTER — Ambulatory Visit (INDEPENDENT_AMBULATORY_CARE_PROVIDER_SITE_OTHER): Payer: Medicaid Other | Admitting: Surgical

## 2020-11-09 ENCOUNTER — Other Ambulatory Visit: Payer: Self-pay

## 2020-11-09 ENCOUNTER — Telehealth: Payer: Self-pay | Admitting: Internal Medicine

## 2020-11-09 DIAGNOSIS — C50812 Malignant neoplasm of overlapping sites of left female breast: Secondary | ICD-10-CM

## 2020-11-09 DIAGNOSIS — N6320 Unspecified lump in the left breast, unspecified quadrant: Secondary | ICD-10-CM

## 2020-11-09 DIAGNOSIS — Z171 Estrogen receptor negative status [ER-]: Secondary | ICD-10-CM

## 2020-11-09 NOTE — Telephone Encounter (Signed)
Reviewed the postsurgical pathology; unfortunately significant residual disease  C-please schedule follow-up-last week of June-MD labs/port-CBC CMP CA 27-29; CEA; Ca15-3.  Thanks  FYI-Dr.Cintron

## 2020-11-09 NOTE — Addendum Note (Signed)
Addended by: Gloris Ham on: 11/09/2020 08:33 AM   Modules accepted: Orders

## 2020-11-20 ENCOUNTER — Inpatient Hospital Stay: Payer: Medicaid Other | Admitting: Internal Medicine

## 2020-11-20 ENCOUNTER — Inpatient Hospital Stay: Payer: Medicaid Other | Attending: Hospice and Palliative Medicine | Admitting: Hospice and Palliative Medicine

## 2020-11-20 ENCOUNTER — Inpatient Hospital Stay: Payer: Medicaid Other

## 2020-11-20 ENCOUNTER — Ambulatory Visit: Payer: Medicaid Other | Admitting: Plastic Surgery

## 2020-11-23 ENCOUNTER — Ambulatory Visit: Payer: Medicaid Other | Admitting: Plastic Surgery

## 2020-11-30 ENCOUNTER — Other Ambulatory Visit: Payer: Self-pay | Admitting: Internal Medicine

## 2020-12-07 ENCOUNTER — Inpatient Hospital Stay: Payer: Medicaid Other

## 2020-12-07 ENCOUNTER — Encounter: Payer: Self-pay | Admitting: Internal Medicine

## 2020-12-07 ENCOUNTER — Other Ambulatory Visit: Payer: Self-pay

## 2020-12-07 ENCOUNTER — Inpatient Hospital Stay: Payer: Medicaid Other | Attending: Internal Medicine | Admitting: Internal Medicine

## 2020-12-07 DIAGNOSIS — F209 Schizophrenia, unspecified: Secondary | ICD-10-CM | POA: Diagnosis not present

## 2020-12-07 DIAGNOSIS — C773 Secondary and unspecified malignant neoplasm of axilla and upper limb lymph nodes: Secondary | ICD-10-CM | POA: Insufficient documentation

## 2020-12-07 DIAGNOSIS — C50812 Malignant neoplasm of overlapping sites of left female breast: Secondary | ICD-10-CM | POA: Diagnosis present

## 2020-12-07 DIAGNOSIS — E860 Dehydration: Secondary | ICD-10-CM

## 2020-12-07 DIAGNOSIS — Z803 Family history of malignant neoplasm of breast: Secondary | ICD-10-CM | POA: Diagnosis not present

## 2020-12-07 DIAGNOSIS — E871 Hypo-osmolality and hyponatremia: Secondary | ICD-10-CM

## 2020-12-07 DIAGNOSIS — Z79899 Other long term (current) drug therapy: Secondary | ICD-10-CM | POA: Diagnosis not present

## 2020-12-07 DIAGNOSIS — Z171 Estrogen receptor negative status [ER-]: Secondary | ICD-10-CM | POA: Diagnosis not present

## 2020-12-07 DIAGNOSIS — Z800 Family history of malignant neoplasm of digestive organs: Secondary | ICD-10-CM | POA: Insufficient documentation

## 2020-12-07 LAB — COMPREHENSIVE METABOLIC PANEL
ALT: 9 U/L (ref 0–44)
AST: 20 U/L (ref 15–41)
Albumin: 3.7 g/dL (ref 3.5–5.0)
Alkaline Phosphatase: 49 U/L (ref 38–126)
Anion gap: 12 (ref 5–15)
BUN: <5 mg/dL — ABNORMAL LOW (ref 6–20)
CO2: 19 mmol/L — ABNORMAL LOW (ref 22–32)
Calcium: 9 mg/dL (ref 8.9–10.3)
Chloride: 90 mmol/L — ABNORMAL LOW (ref 98–111)
Creatinine, Ser: 0.54 mg/dL (ref 0.44–1.00)
GFR, Estimated: >60 mL/min (ref >60)
Glucose, Bld: 107 mg/dL — ABNORMAL HIGH (ref 70–99)
Potassium: 3.7 mmol/L (ref 3.5–5.1)
Sodium: 121 mmol/L — ABNORMAL LOW (ref 135–145)
Total Bilirubin: 0.6 mg/dL (ref 0.3–1.2)
Total Protein: 7.2 g/dL (ref 6.5–8.1)

## 2020-12-07 LAB — CBC WITH DIFFERENTIAL/PLATELET
Abs Immature Granulocytes: 0.1 10*3/uL — ABNORMAL HIGH (ref 0.00–0.07)
Basophils Absolute: 0 10*3/uL (ref 0.0–0.1)
Basophils Relative: 0 %
Eosinophils Absolute: 0 10*3/uL (ref 0.0–0.5)
Eosinophils Relative: 0 %
HCT: 30.4 % — ABNORMAL LOW (ref 36.0–46.0)
Hemoglobin: 10.6 g/dL — ABNORMAL LOW (ref 12.0–15.0)
Immature Granulocytes: 1 %
Lymphocytes Relative: 16 %
Lymphs Abs: 1.1 10*3/uL (ref 0.7–4.0)
MCH: 32.7 pg (ref 26.0–34.0)
MCHC: 34.9 g/dL (ref 30.0–36.0)
MCV: 93.8 fL (ref 80.0–100.0)
Monocytes Absolute: 1.6 10*3/uL — ABNORMAL HIGH (ref 0.1–1.0)
Monocytes Relative: 23 %
Neutro Abs: 4.3 10*3/uL (ref 1.7–7.7)
Neutrophils Relative %: 60 %
Platelets: 240 10*3/uL (ref 150–400)
RBC: 3.24 MIL/uL — ABNORMAL LOW (ref 3.87–5.11)
RDW: 13.3 % (ref 11.5–15.5)
WBC: 7.2 10*3/uL (ref 4.0–10.5)
nRBC: 0 % (ref 0.0–0.2)

## 2020-12-07 MED ORDER — SODIUM CHLORIDE 0.9 % IV SOLN
INTRAVENOUS | Status: DC
  Filled 2020-12-07 (×2): qty 250

## 2020-12-07 MED ORDER — HEPARIN SOD (PORK) LOCK FLUSH 100 UNIT/ML IV SOLN
500.0000 [IU] | Freq: Once | INTRAVENOUS | Status: AC
  Administered 2020-12-07: 500 [IU]
  Filled 2020-12-07: qty 5

## 2020-12-07 MED ORDER — SODIUM CHLORIDE 0.9% FLUSH
10.0000 mL | Freq: Once | INTRAVENOUS | Status: AC
  Administered 2020-12-07: 10 mL via INTRAVENOUS
  Filled 2020-12-07: qty 10

## 2020-12-07 NOTE — Progress Notes (Signed)
Patient tolerated IV fluids infusion well today, no concerns voiced. Patient discharged with mom who has future apts. Stable.

## 2020-12-07 NOTE — Progress Notes (Signed)
Tamara [name] CONSULT NOTE  Patient Care Team: Physicians, [place] as PCP - General Vladimir Crofts, MD as Consulting Physician (Neurology) Cammie Sickle, MD as Consulting Physician (Internal Medicine) Randel Pigg, MD as Consulting Physician (Psychiatry) Herbert Pun, MD as Consulting Physician (General Surgery)  CHIEF COMPLAINTS/PURPOSE OF CONSULTATION: Breast cancer  #  Oncology History Overview Note  # NOV 2021- LEFT BREAST CANCER - [place]; TRIPLE NEGATIVE; Grade of invasive carcinoma: Grade 3; # LYMPH NODE, LEFT AXILLA; [Dr.Cintron]; ULTRASOUND-GUIDED CORE BIOPSY:  - METASTATIC MAMMARY CARCINOMA, AT LEAST 9 MM METASTASIS. ]; DEC 9th PET scan-Intensely hypermetabolic 09.2 cm left breast mass compatible with primary left breast malignancy, with left pectoralis muscle involvement posteriorly and cutaneous involvement by the mass. Multiple smaller hypermetabolic satellite malignant nodules scattered throughout the left breast. Bulky hypermetabolic left axillary and left retropectoral nodal metastases.   # 12/10- Carboplatin ONLY;  Taxol hypersensitivity reaction-discontinued  # 12/17- AC #1 q 3 W x4; MARCH 28th- CARBO-ABRAXANE-KEYTRUDA; #cycle 1-8 [abraxane dose reduced to 75/m2- sec to diarrhea-3]  DIAGNOSIS:  A. LEFT BREAST; RADICAL MASTECTOMY:  - INVASIVE MAMMARY CARCINOMA.  - METASTATIC CARCINOMA INVOLVING ONE OF TWELVE LYMPH NODES (1/12).  - SEE CANCER SUMMARY BELOW.   B. AXILLARY TISSUE, LEFT; RESECTION:  - TWO LYMPH NODES, NEGATIVE FOR METASTATIC CARCINOMA (0/2).   CANCER CASE SUMMARY: INVASIVE CARCINOMA OF THE BREAST  Standard(s): AJCC-UICC 8   SPECIMEN  Procedure: Radical mastectomy  Specimen Laterality: Left   TUMOR  Histologic Type: Invasive mammary carcinoma of no special type  Histologic Grade (Nottingham Histologic Score)                       Glandular (Acinar)/Tubular Differentiation: 3                       Nuclear  Pleomorphism: 3                       Mitotic Rate: 3                       Overall Grade: 3  Tumor Size: 82 x 77 x 68 mm, separate 2 mm focus within randomly sampled  upper inner quadrant  Ductal Carcinoma In Situ (DCIS): Not identified  Lymphovascular Invasion: Present  Treatment Effect in the Breast: Treatment Effect in the Breast: No  definite response to presurgical therapy in the invasive carcinoma  Treatment Effect in the Lymph Nodes: Probable or definite response to  presurgical therapy in metastatic carcinoma  Residual Cancer Burden (RCB) from MD [facility] calculator (website  below):       Primary Tumor Bed            Primary tumor bed: 82 mm x 77 mm            Overall cancer cellularity: 100%            Percentage of cancer that is in situ: 0%       Lymph nodes            Number of lymph nodes positive for metastasis: 1            Diameter of largest metastasis: 8 mm       Residual Cancer Burden: 4.37       Residual Cancer Burden Class: RCB-III   [email or web address]    MARGINS  Margin Status for Invasive Carcinoma: All margins  negative for invasive  carcinoma                       Distance from closest margin: 3 mm                       Specify closest margin: Posterior   Margin Status for DCIS: Not applicable   REGIONAL LYMPH NODES  Regional Lymph Node Status: Tumor present in regional lymph node(s)                       Number of Lymph Nodes with Macrometastases (greater  than 2 mm): 1                       Number of Lymph Nodes with Micrometastases (greater  than 0.2 mm to 2 mm and/or greater than 200 cells): 0                       Number of Lymph Nodes with Isolated Tumor Cells  (0.2 mm or less OR 200 cells or less): 0                       Size of Largest Metastatic Deposit: 8 mm                       Extranodal Extension: Not identified                       Total Number of Lymph Nodes Examined (sentinel and   non-sentinel): 14                       Number of Sentinel Nodes Examined: 0  DISTANT METASTASIS  Distant Site(s) Involved, if applicable: Not applicable   PATHOLOGIC STAGE CLASSIFICATION (pTNM, AJCC 8th Edition):  TNM Descriptors: M (multifocal)  pT3  Regional Lymph Nodes Modifier: Not applicable  pN 1a  pM - Not applicable   # SURVIVORSHIP:   # GENETICS: p  DIAGNOSIS: Triple negative breast cancer  STAGE: Stage III       ;  GOALS: Cure  CURRENT/MOST RECENT THERAPY : AC    Carcinoma of overlapping sites of left breast in female, estrogen receptor negative ([place])  04/24/2020 Initial Diagnosis   Carcinoma of overlapping sites of left breast in female, estrogen receptor negative ([place])   05/04/2020 - 05/04/2020 Chemotherapy   The patient had dexamethasone (DECADRON) 4 MG tablet, 8 mg, Oral, Daily, 1 of 1 cycle, Start date: --, End date: -- palonosetron (ALOXI) injection 0.25 mg, 0.25 mg, Intravenous,  Once, 1 of 12 cycles Administration: 0.25 mg (05/04/2020) CARBOplatin (PARAPLATIN) 220 mg in sodium chloride 0.9 % 250 mL chemo infusion, 220 mg (100 % of original dose 222.4 mg), Intravenous,  Once, 1 of 12 cycles Dose modification:   (original dose 222.4 mg, Cycle 1) Administration: 220 mg (05/04/2020) PACLitaxel (TAXOL) 132 mg in sodium chloride 0.9 % 250 mL chemo infusion (</= $RemoveBefor'80mg'loErphtDtoEB$ /m2), 80 mg/m2 = 132 mg, Intravenous,  Once, 1 of 12 cycles Administration: 132 mg (05/04/2020)   for chemotherapy treatment.     05/11/2020 - 07/31/2020 Chemotherapy          06/14/2020 Cancer Staging   Staging form: Breast, AJCC 8th Edition - Clinical: Stage IIIC (cT4, cN2b, cM0, G3, ER-, PR-, HER2-) - Signed by Charlaine Dalton  R, MD on 06/14/2020    08/20/2020 -  Chemotherapy    Patient is on Treatment Plan: BREAST PEMBROLIZUMAB + CARBOPLATIN D1 + ABRAXANEL D1,8,15 Q21D X 4 CYCLES         HISTORY OF PRESENTING ILLNESS: Patient is accompanied by her mother/patient is poor  historian. Tamara [name] 50 y.o.  female with schizophrenia and left locally advanced triple negative breast cancer T4 N2; currently s/p "partial" neoadjuvant chemotherapy with carboplatin and Abraxane Keytruda-s/p mastectomy/axillary for dissection is here for follow-up.  Patient was reevaluated by surgery earlier in the week.  Overall she feels well.  Patient denies any nausea vomiting abdominal pain or headache.   Review of Systems  Unable to perform ROS: Psychiatric disorder    MEDICAL HISTORY:  Past Medical History:  Diagnosis Date  . Breast cancer ([place]) 03/2020   left  . Family history of breast cancer   . Family history of stomach cancer   . Family history of stomach cancer   . GERD (gastroesophageal reflux disease)   . Hypothyroidism   . IUD (intrauterine device) in place IUD was placed in 2016  . Leiomyoma of uterus   . Schizophrenia ([place])   . Thyroid disease     SURGICAL HISTORY: Past Surgical History:  Procedure Laterality Date  . ADJACENT TISSUE TRANSFER/TISSUE REARRANGEMENT N/A 10/29/2020   Procedure: POSSIBLE ADJACENT TISSUE TRANSFER, TISSUE REARRANGEMENT;  Surgeon: Wallace Going, DO;  Location: [facility] ORS;  Service: Plastics;  Laterality: N/A;  . APPLICATION OF A-CELL OF CHEST/ABDOMEN N/A 10/29/2020   Procedure: POSSIBLE APPLICATION OF A-CELL OF CHEST/ABDOMEN;  Surgeon: Wallace Going, DO;  Location: [facility] ORS;  Service: Plastics;  Laterality: N/A;  . APPLICATION OF WOUND VAC N/A 10/29/2020   Procedure: POSSIBLE APPLICATION OF WOUND VAC;  Surgeon: Wallace Going, DO;  Location: [facility] ORS;  Service: Plastics;  Laterality: N/A;  . BREAST BIOPSY Left 03/2020   [facility]- Triple neg- Stage 3  . PORTACATH PLACEMENT Right 05/02/2020   Procedure: INSERTION PORT-A-CATH;  Surgeon: Herbert Pun, MD;  Location: [facility] ORS;  Service: General;  Laterality: Right;  . TOTAL MASTECTOMY Left 10/29/2020   Procedure: TOTAL MASTECTOMY --- RADICAL;  Surgeon: Herbert Pun, MD;  Location: [facility] ORS;  Service: General;  Laterality: Left;  needs 3 hrs    SOCIAL HISTORY: Social History   Socioeconomic History  . Marital status: Single    Spouse name: Not on file  . Number of children: Not on file  . Years of education: Not on file  . Highest education level: Not on file  Occupational History  . Not on file  Tobacco Use  . Smoking status: Never  . Smokeless tobacco: Never  Vaping Use  . Vaping Use: Never used  Substance and Sexual Activity  . Alcohol use: No  . Drug use: No  . Sexual activity: Yes    Partners: Male    Birth control/protection: Surgical, Injection  Other Topics Concern  . Not on file  Social History Narrative  . Not on file   Social Determinants of Health   Financial Resource Strain: Not on file  Food Insecurity: Not on file  Transportation Needs: Not on file  Physical Activity: Not on file  Stress: Not on file  Social Connections: Not on file  Intimate Partner Violence: Not on file    FAMILY HISTORY: Family History  Problem Relation Age of Onset  . Breast cancer Mother   . Stomach cancer Maternal Grandfather   . Other  Maternal Aunt        died from tumor, brain?    ALLERGIES:  is allergic to paclitaxel.  MEDICATIONS:  Current Outpatient Medications  Medication Sig Dispense Refill  . benztropine (COGENTIN) 0.5 MG tablet Take 1 tablet (0.5 mg total) by mouth 2 (two) times daily. 60 tablet 1  . cetirizine (ZYRTEC) 10 MG tablet Take 1 tablet (10 mg total) by mouth daily as needed for allergies.    . clonazePAM (KLONOPIN) 0.5 MG tablet Take 0.5 mg by mouth at bedtime.    . divalproex (DEPAKOTE) 500 MG DR tablet Take 1 tablet (500 mg total) by mouth every 12 (twelve) hours. 60 tablet 1  . fluticasone (FLONASE) 50 MCG/ACT nasal spray Place 1 spray into both nostrils daily. (Patient taking differently: Place 1 spray into both nostrils daily as needed for allergies.) 11.1 mL 9  . haloperidol (HALDOL) 5 MG tablet  Take 1 tablet (5 mg total) by mouth 3 (three) times daily. 90 tablet 1  . haloperidol decanoate (HALDOL DECANOATE) 100 MG/ML injection Inject 100 mg into the muscle every 28 (twenty-eight) days.    Marland Kitchen HYDROcodone-acetaminophen (NORCO/VICODIN) 5-325 MG tablet Take 1 tablet by mouth every 4 (four) hours as needed for moderate pain. (Patient not taking: Reported on 11/09/2020) 20 tablet 0  . levothyroxine (SYNTHROID) 88 MCG tablet TAKE 1 TABLET (88 MCG TOTAL) BY MOUTH DAILY AT 6 (SIX) AM. 30 tablet 1  . lidocaine-prilocaine (EMLA) cream Apply 1 application topically as needed. 30 g 3  . medroxyPROGESTERone (DEPO-PROVERA) 150 MG/ML injection Inject 150 mg into the muscle every 3 (three) months.    Marland Kitchen MELATONIN PO Take 1 tablet by mouth at bedtime as needed (sleep).    . ondansetron (ZOFRAN) 8 MG tablet One pill every 8 hours as needed for nausea/vomitting. (Patient taking differently: Take 8 mg by mouth every 8 (eight) hours as needed for vomiting or nausea.) 40 tablet 1  . potassium chloride SA (KLOR-CON M20) 20 MEQ tablet Take 1 tablet (20 mEq total) by mouth 2 (two) times daily. 60 tablet 0  . prochlorperazine (COMPAZINE) 10 MG tablet Take 1 tablet (10 mg total) by mouth every 6 (six) hours as needed for nausea or vomiting. 40 tablet 1  . sodium chloride 1 g tablet Take 1 tablet (1 g total) by mouth 3 (three) times daily. 90 tablet 0   No current facility-administered medications for this visit.   Facility-Administered Medications Ordered in Other Visits  Medication Dose Route Frequency Provider Last Rate Last Admin  . sodium chloride flush (NS) 0.9 % injection 10 mL  10 mL Intravenous PRN Cammie Sickle, MD   10 mL at 06/14/20 0840      .  PHYSICAL EXAMINATION: ECOG PERFORMANCE STATUS: 1 - Symptomatic but completely ambulatory  Vitals:   12/07/20 1300  BP: (!) 141/66  Pulse: (!) 105  Resp: 18  Temp: 97.6 F (36.4 C)   Filed Weights   12/07/20 1300  Weight: 155 lb (70.3 kg)     Physical Exam Constitutional:      Comments: Accompanied by mother.  Ambulating independently.  HENT:     Head: Normocephalic and atraumatic.     Mouth/Throat:     Pharynx: No oropharyngeal exudate.  Eyes:     Pupils: Pupils are equal, round, and reactive to light.  Cardiovascular:     Rate and Rhythm: Normal rate and regular rhythm.  Pulmonary:     Effort: No respiratory distress.  Breath sounds: No wheezing.  Abdominal:     General: Bowel sounds are normal. There is no distension.     Palpations: Abdomen is soft. There is no mass.     Tenderness: There is no abdominal tenderness. There is no guarding or rebound.  Musculoskeletal:        General: No tenderness. Normal range of motion.     Cervical back: Normal range of motion and neck supple.  Skin:    General: Skin is warm.     Comments: Left postmastectomy incision noted bleeding.  However multiple approximately 1 cm papules noted around the mastectomy site.  Concerning for recurrence.  Neurological:     Mental Status: She is alert and oriented to person, place, and time.  Psychiatric:        Mood and Affect: Affect normal.     Comments: Poor insight/tangential thoughts.  Unable to make a coherent conversation-underlying schizophrenia.     LABORATORY DATA:  I have reviewed the data as listed Lab Results  Component Value Date   WBC 7.2 12/07/2020   HGB 10.6 (L) 12/07/2020   HCT 30.4 (L) 12/07/2020   MCV 93.8 12/07/2020   PLT 240 12/07/2020   Recent Labs    09/26/20 0754 10/02/20 0850 10/10/20 0755 10/29/20 1022 10/30/20 0545 12/07/20 1241  NA 128* 123*   < > 132* 129* 121*  K 3.6 3.1*   < > 4.0 3.6 3.7  CL 92* 86*   < > 98 98 90*  CO2 25 27   < > 22 24 19*  GLUCOSE 87 81   < > 78 93 107*  BUN <5* 5*   < > <5* <5* <5*  CREATININE 0.51 0.47   < > 0.64 0.64 0.54  CALCIUM 9.3 8.8*   < > 9.6 9.0 9.0  GFRNONAA >60 >60   < > >60 >60 >60  PROT 7.2 6.7  --   --   --  7.2  ALBUMIN 3.7 3.8  --   --   --   3.7  AST 20 29  --   --   --  20  ALT 10 12  --   --   --  9  ALKPHOS 55 55  --   --   --  49  BILITOT 0.4 0.5  --   --   --  0.6   < > = values in this interval not displayed.    RADIOGRAPHIC STUDIES: I have personally reviewed the radiological images as listed and agreed with the findings in the report. No results found.     06/04/2020-     07/30/2020-    On 4/26-    09/26/2020    10/02/2020   5/25       ASSESSMENT & PLAN:   Carcinoma of overlapping sites of left breast in female, estrogen receptor negative ([place]) #Left breast cancer T4 N2 M0-stage III triple negative; s/p Adriamycin Cytoxan every 3 weeks x4; followed bycarboplatin-Keytruda-Abraxane cycle #2; day-1-s/p modified radical mastectomy and axillary lymph node dissection.  Unfortunately significant residual disease noted.   #Today local recurrence noted-complains of multiple papules like lesions that at the left mastectomy site-concerning for recurrent malignancy.  Discussed with Dr. Peyton Najjar.  We will make a referral to Dr. Donella Stade for palliative radiation.  Patient has poor tolerance to chemotherapy.  #Post palliative radiation to the chest wall-systemic chemotherapy could be considered.  #Electrolytes abnormalities: chronic hyponatremia-likely secondary to SIDH/psychiatric medication-worse;  sodium today 121; discussed  re: free water restriction;  Potassium 3.1; continue 20 meq twice daily at home.  IV fluids today.  # schizophrenia: STABLE; Tritnity [phone]; Dr.Ahluwalia].   # Genetics: s/p genetic testing/counseling-discussed with genetic counselor-verbal report-negative for any deleterious mutations.  # DISPOSITION:  #  IVF 1 lit today. # radiation oncology re: post mastectomy RT- ASAP # follow up in 2 weeks- MD; labs- cbc/bmp;Possible IVFs 1 lit- Dr.B     All questions were answered. The patient knows to call the clinic with any problems, questions or concerns.    Cammie Sickle, MD 12/16/2020 11:13 PM

## 2020-12-07 NOTE — Progress Notes (Signed)
RN Chaperoned provider with Breast Exam.   

## 2020-12-07 NOTE — Assessment & Plan Note (Addendum)
#  Left breast cancer T4 N2 M0-stage III triple negative; s/p Adriamycin Cytoxan every 3 weeks x4; followed bycarboplatin-Keytruda-Abraxane cycle #2; day-1-s/p modified radical mastectomy and axillary lymph node dissection.  Unfortunately significant residual disease noted.   #Today local recurrence noted-complains of multiple papules like lesions that at the left mastectomy site-concerning for recurrent malignancy.  Discussed with Dr. Peyton Najjar.  We will make a referral to Dr. Donella Stade for palliative radiation.  Patient has poor tolerance to chemotherapy.  #Post palliative radiation to the chest wall-systemic chemotherapy could be considered.  #Electrolytes abnormalities: chronic hyponatremia-likely secondary to SIDH/psychiatric medication-worse;  sodium today 121; discussed re: free water restriction;  Potassium 3.1; continue 20 meq twice daily at home.  IV fluids today.  # schizophrenia: STABLE; Tritnity [phone]; Dr.Ahluwalia].   # Genetics: s/p genetic testing/counseling-discussed with genetic counselor-verbal report-negative for any deleterious mutations.  # DISPOSITION:  #  IVF 1 lit today. # radiation oncology re: post mastectomy RT- ASAP # follow up in 2 weeks- MD; labs- cbc/bmp;Possible IVFs 1 lit- Dr.B

## 2020-12-08 LAB — CEA: CEA: 1.6 ng/mL (ref 0.0–4.7)

## 2020-12-08 LAB — CANCER ANTIGEN 15-3: CA 15-3: 36.8 U/mL — ABNORMAL HIGH (ref 0.0–25.0)

## 2020-12-08 LAB — CANCER ANTIGEN 27.29: CA 27.29: 38.7 U/mL — ABNORMAL HIGH (ref 0.0–38.6)

## 2020-12-10 ENCOUNTER — Telehealth: Payer: Self-pay | Admitting: Internal Medicine

## 2020-12-10 NOTE — Telephone Encounter (Signed)
Spoke with patient's mother Katharine Look) to notify her of appointment on 7/21 @ 1:30 to see Dr. Dwyane Dee confirmed this day and time.

## 2020-12-13 ENCOUNTER — Ambulatory Visit: Payer: Medicaid Other | Admitting: Radiation Oncology

## 2020-12-16 ENCOUNTER — Encounter: Payer: Self-pay | Admitting: Internal Medicine

## 2020-12-19 ENCOUNTER — Ambulatory Visit
Admission: RE | Admit: 2020-12-19 | Discharge: 2020-12-19 | Disposition: A | Discharge status: Home / Self Care | Payer: Medicaid Other | Source: Ambulatory Visit | Attending: Radiation Oncology | Admitting: Radiation Oncology

## 2020-12-19 VITALS — BP 154/79 | HR 122 | Resp 18 | Wt 150.0 lb

## 2020-12-19 DIAGNOSIS — Z79899 Other long term (current) drug therapy: Secondary | ICD-10-CM | POA: Diagnosis not present

## 2020-12-19 DIAGNOSIS — Z171 Estrogen receptor negative status [ER-]: Secondary | ICD-10-CM | POA: Diagnosis not present

## 2020-12-19 DIAGNOSIS — E039 Hypothyroidism, unspecified: Secondary | ICD-10-CM | POA: Diagnosis not present

## 2020-12-19 DIAGNOSIS — K219 Gastro-esophageal reflux disease without esophagitis: Secondary | ICD-10-CM | POA: Insufficient documentation

## 2020-12-19 DIAGNOSIS — F209 Schizophrenia, unspecified: Secondary | ICD-10-CM | POA: Diagnosis not present

## 2020-12-19 DIAGNOSIS — C50812 Malignant neoplasm of overlapping sites of left female breast: Secondary | ICD-10-CM | POA: Diagnosis present

## 2020-12-19 NOTE — Consult Note (Signed)
NEW PATIENT EVALUATION  Name: Tamara [name]  MRN: [id number]  Date:   12/19/2020     DOB: 03/28/1971   This 50 y.o. female patient presents to the clinic for initial evaluation of left chest wall recurrence within 1 month of left modified radical mastectomy for triple negative.  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   PREVIOUS INVESTIGATIONS:  Pathology report reviewed PET scan mammograms and ultrasound reviewed Clinical notes reviewed  HPI: Patient is a 50 year old female who presented with an extremely large left breast mass triple negative initially T4N2.  Patient does have schizophrenia accompanied by her mother today.  She initially had undergone neoadjuvant chemotherapy with [place] T Abraxane and Keytruda.  She had minimal response and then underwent a left modified radical mastectomy.  At the time of surgery lesion was 8.2 x 7.7 cm.  Lymphovascular vision was present no definitive response to therapy was seen.  She had 1 macro metastatic lymph node out of 14 examined.  Margins were close at 3 mm from her posterior margin.  She has already developed skin recurrence multiple sites of her left chest wall.  She had poor tolerance to chemotherapy is now referred to ration collagen for palliative check therapy to her left chest wall.  She is otherwise doing well and no significant pain at this time.  PLANNED TREATMENT REGIMEN: Left chest wall and peripheral lymphatic radiation  PAST MEDICAL HISTORY:  has a past medical history of Breast cancer ([place]) (03/2020), Family history of breast cancer, Family history of stomach cancer, Family history of stomach cancer, GERD (gastroesophageal reflux disease),  Hypothyroidism, IUD (intrauterine device) in place (IUD was placed in 2016), Leiomyoma of uterus, Schizophrenia ([place]), and Thyroid disease.    PAST SURGICAL HISTORY:  Past Surgical History:  Procedure Laterality Date   ADJACENT TISSUE TRANSFER/TISSUE REARRANGEMENT N/A 10/29/2020   Procedure: POSSIBLE ADJACENT TISSUE TRANSFER, TISSUE REARRANGEMENT;  Surgeon: Wallace Going, DO;  Location: [facility] OR[facility];  Service: Plastics;  Laterality: N/A;   APPLICATION OF A-CELL OF CHEST/ABDOMEN N/A 10/29/2020   Procedure: POSSIBLE APPLICATION OF A-CELL OF CHEST/ABDOMEN;  Surgeon: Wallace Going, DO;  Location: [facility];  Service: Plastics;  Laterality: N/A;   APPLICATION OF WOUND VAC N/A 10/29/2020   Procedure: POSSIBLE APPLICATION OF WOUND VAC;  Surgeon: Wallace Going, DO;  Location: [facility];  Service: Plastics;  Laterality: N/A;   BREAST BIOPSY Left 03/2020   [facility]- Triple neg- Stage 3   PORTACATH PLACEMENT Right 05/02/2020   Procedure: INSERTION PORT-A-CATH;  Surgeon: Herbert Pun, MD;  Location: [facility] OR[facility];  Service: General;  Laterality: Right;   TOTAL MASTECTOMY Left 10/29/2020   Procedure: TOTAL MASTECTOMY --- RADICAL;  Surgeon: Herbert Pun, MD;  Location: [facility] OR[facility];  Service: General;  Laterality: Left;  needs 3 hrs    FAMILY HISTORY: family history includes Breast cancer in her mother; Other in her maternal aunt; Stomach cancer in her maternal grandfather.  SOCIAL HISTORY:  reports that she has never smoked. She has never used smokeless tobacco. She reports that she does not drink alcohol and does not use drugs.  ALLERGIES: Paclitaxel  MEDICATIONS:  Current Outpatient Medications  Medication Sig Dispense Refill   benztropine (COGENTIN) 0.5 MG tablet Take  1 tablet (0.5 mg total) by mouth 2 (two) times daily. 60 tablet 1   cetirizine (ZYRTEC) 10 MG tablet Take 1 tablet (10 mg total) by mouth daily as needed for allergies.     clonazePAM (KLONOPIN) 0.5 MG tablet Take 0.5 mg  by mouth at bedtime.     divalproex (DEPAKOTE) 500 MG DR tablet Take 1 tablet (500 mg total) by mouth every 12 (twelve) hours. 60 tablet 1   fluticasone (FLONASE) 50 MCG/ACT nasal spray Place 1 spray into both nostrils daily. (Patient taking differently: Place 1 spray into both nostrils daily as needed for allergies.) 11.1 mL 9   haloperidol (HALDOL) 5 MG tablet Take 1 tablet (5 mg total) by mouth 3 (three) times daily. 90 tablet 1   haloperidol decanoate (HALDOL DECANOATE) 100 MG/ML injection Inject 100 mg into the muscle every 28 (twenty-eight) days.     hydrOXYzine (ATARAX/VISTARIL) 25 MG tablet Take 25 mg by mouth every 8 (eight) hours as needed.     levothyroxine (SYNTHROID) 88 MCG tablet TAKE 1 TABLET (88 MCG TOTAL) BY MOUTH DAILY AT 6 (SIX) AM. 30 tablet 1   lidocaine-prilocaine (EMLA) cream Apply 1 application topically as needed. 30 g 3   medroxyPROGESTERone (DEPO-PROVERA) 150 MG/ML injection Inject 150 mg into the muscle every 3 (three) months.     MELATONIN PO Take 1 tablet by mouth at bedtime as needed (sleep).     ondansetron (ZOFRAN) 8 MG tablet One pill every 8 hours as needed for nausea/vomitting. (Patient taking differently: Take 8 mg by mouth every 8 (eight) hours as needed for vomiting or nausea.) 40 tablet 1   potassium chloride SA (KLOR-CON M20) 20 MEQ tablet Take 1 tablet (20 mEq total) by mouth 2 (two) times daily. 60 tablet 0   prochlorperazine (COMPAZINE) 10 MG tablet Take 1 tablet (10 mg total) by mouth every 6 (six) hours as needed for nausea or vomiting. 40 tablet 1   sodium chloride 1 g tablet Take 1 tablet (1 g total) by mouth 3 (three) times daily. 90 tablet 0   HYDROcodone-acetaminophen (NORCO/VICODIN) 5-325 MG tablet Take 1 tablet by mouth every 4 (four) hours as needed for moderate pain. (Patient not taking: No sig reported) 20 tablet 0   No current facility-administered medications for this encounter.   Facility-Administered Medications Ordered in Other Encounters   Medication Dose Route Frequency Provider Last Rate Last Admin   sodium chloride flush (NS) 0.9 % injection 10 mL  10 mL Intravenous PRN Cammie Sickle, MD   10 mL at 06/14/20 0840    ECOG PERFORMANCE STATUS:  0 - Asymptomatic  REVIEW OF SYSTEMS: Patient does have schizophrenia. Patient denies any weight loss, fatigue, weakness, fever, chills or night sweats. Patient denies any loss of vision, blurred vision. Patient denies any ringing  of the ears or hearing loss. No irregular heartbeat. Patient denies heart murmur or history of fainting. Patient denies any chest pain or pain radiating to her upper extremities. Patient denies any shortness of breath, difficulty breathing at night, cough or hemoptysis. Patient denies any swelling in the lower legs. Patient denies any nausea vomiting, vomiting of blood, or coffee ground material in the vomitus. Patient denies any stomach pain. Patient states has had normal bowel movements no significant constipation or diarrhea. Patient denies any dysuria, hematuria or significant nocturia. Patient denies any problems walking, swelling in the joints or loss of balance. Patient denies any skin changes, loss of hair or loss of weight. Patient denies  any excessive worrying or anxiety or significant depression. Patient denies any problems with insomnia. Patient denies excessive thirst, polyuria, polydipsia. Patient denies any swollen glands, patient denies easy bruising or easy bleeding. Patient denies any recent infections, allergies or URI. Patient "s visual fields have not changed significantly in recent time.   PHYSICAL EXAM: BP (!) 154/79   Pulse (!) 122   Resp 18   Wt 150 lb (68 kg)   BMI 27.44 kg/m  She is status post left modified radical mastectomy.  Left chest walls diffusely involved with recurrent invasive mammary carcinoma.  Right breast is free of mass or nodularity.  No axillary or supraclavicular adenopathy is identified.  Well-developed  well-nourished patient in NAD. HEENT reveals PERLA, EOMI, discs not visualized.  Oral cavity is clear. No oral mucosal lesions are identified. Neck is clear without evidence of cervical or supraclavicular adenopathy. Lungs are clear to A&P. Cardiac examination is essentially unremarkable with regular rate and rhythm without murmur rub or thrill. Abdomen is benign with no organomegaly or masses noted. Motor sensory and DTR levels are equal and symmetric in the upper and lower extremities. Cranial nerves II through XII are grossly intact. Proprioception is intact. No peripheral adenopathy or edema is identified. No motor or sensory levels are noted. Crude visual fields are within normal range.  LABORATORY DATA: Pathology report reviewed    RADIOLOGY RESULTS: Mammogram ultrasound and PET CT scans reviewed compatible with above-stated findings   IMPRESSION stage IIIa invasive mammary carcinoma triple negative the left breast status post neoadjuvant chemotherapy followed by left modified radical mastectomy now with chest wall recurrence within 1 month of surgery in 50 year old female  PLAN: This is an unfortunate situation I have recommended going ahead with left chest wall and peripheral emphatic radiation.  We treat up to 50 Gray over 5 weeks and evaluate for response.  At that time may boost areas of residual disease with electron beam therapy up to 20 Gray.  Risks and benefits of treatment including skin reaction fatigue possible occlusion of superficial lung possible increased incidence of lymphedema of her left upper extremity all were described in detail to the patient and her mother.  They both seem to comprehend my treatment plan well.  I personally set up and ordered CT simulation for first thing next week and hopefully she will be under treatment by the end of next week.  I would like to take this opportunity to thank you for allowing me to participate in the care of your patient.Noreene Filbert, MD

## 2020-12-21 ENCOUNTER — Inpatient Hospital Stay (HOSPITAL_BASED_OUTPATIENT_CLINIC_OR_DEPARTMENT_OTHER): Payer: Medicaid Other | Admitting: Internal Medicine

## 2020-12-21 ENCOUNTER — Other Ambulatory Visit: Payer: Self-pay

## 2020-12-21 ENCOUNTER — Other Ambulatory Visit: Payer: Self-pay | Admitting: Internal Medicine

## 2020-12-21 ENCOUNTER — Inpatient Hospital Stay: Payer: Medicaid Other

## 2020-12-21 ENCOUNTER — Encounter: Payer: Self-pay | Admitting: Internal Medicine

## 2020-12-21 VITALS — BP 133/67 | HR 88 | Temp 96.9°F | Resp 16

## 2020-12-21 DIAGNOSIS — Z171 Estrogen receptor negative status [ER-]: Secondary | ICD-10-CM | POA: Diagnosis not present

## 2020-12-21 DIAGNOSIS — C50812 Malignant neoplasm of overlapping sites of left female breast: Secondary | ICD-10-CM

## 2020-12-21 LAB — CBC WITH DIFFERENTIAL/PLATELET
Abs Immature Granulocytes: 0.11 10*3/uL — ABNORMAL HIGH (ref 0.00–0.07)
Basophils Absolute: 0 10*3/uL (ref 0.0–0.1)
Basophils Relative: 1 %
Eosinophils Absolute: 0 10*3/uL (ref 0.0–0.5)
Eosinophils Relative: 0 %
HCT: 32.7 % — ABNORMAL LOW (ref 36.0–46.0)
Hemoglobin: 11.5 g/dL — ABNORMAL LOW (ref 12.0–15.0)
Immature Granulocytes: 2 %
Lymphocytes Relative: 16 %
Lymphs Abs: 1 10*3/uL (ref 0.7–4.0)
MCH: 32.1 pg (ref 26.0–34.0)
MCHC: 35.2 g/dL (ref 30.0–36.0)
MCV: 91.3 fL (ref 80.0–100.0)
Monocytes Absolute: 1.3 10*3/uL — ABNORMAL HIGH (ref 0.1–1.0)
Monocytes Relative: 21 %
Neutro Abs: 3.8 10*3/uL (ref 1.7–7.7)
Neutrophils Relative %: 60 %
Platelets: 212 10*3/uL (ref 150–400)
RBC: 3.58 MIL/uL — ABNORMAL LOW (ref 3.87–5.11)
RDW: 13 % (ref 11.5–15.5)
WBC: 6.3 10*3/uL (ref 4.0–10.5)
nRBC: 0 % (ref 0.0–0.2)

## 2020-12-21 LAB — BASIC METABOLIC PANEL
Anion gap: 10 (ref 5–15)
BUN: 6 mg/dL (ref 6–20)
CO2: 25 mmol/L (ref 22–32)
Calcium: 9.5 mg/dL (ref 8.9–10.3)
Chloride: 89 mmol/L — ABNORMAL LOW (ref 98–111)
Creatinine, Ser: 0.67 mg/dL (ref 0.44–1.00)
GFR, Estimated: >60 mL/min (ref >60)
Glucose, Bld: 95 mg/dL (ref 70–99)
Potassium: 3.8 mmol/L (ref 3.5–5.1)
Sodium: 124 mmol/L — ABNORMAL LOW (ref 135–145)

## 2020-12-21 MED ORDER — SODIUM CHLORIDE 0.9% FLUSH
10.0000 mL | Freq: Once | INTRAVENOUS | Status: AC | PRN
  Administered 2020-12-21: 10 mL
  Filled 2020-12-21: qty 10

## 2020-12-21 MED ORDER — HEPARIN SOD (PORK) LOCK FLUSH 100 UNIT/ML IV SOLN
500.0000 [IU] | Freq: Once | INTRAVENOUS | Status: AC | PRN
  Administered 2020-12-21: 500 [IU]
  Filled 2020-12-21: qty 5

## 2020-12-21 MED ORDER — SODIUM CHLORIDE 0.9 % IV SOLN
Freq: Once | INTRAVENOUS | Status: AC
  Filled 2020-12-21: qty 250

## 2020-12-21 NOTE — Assessment & Plan Note (Addendum)
#  Locally recurrent recurrent/stage IV -left breast cancer T4 N2 M1 [subcutaneous nodules] Triple negative-s/p neoadjuvant chemotherapy-currently noted to have significant local recurrence.  Patient also has significant progression in the last 2 weeks. [See images above].  #Patient has been evaluated by radiation oncology; awaiting radiation next couple of weeks.  Discussed with Dr. Donella Stade regarding expediting radiation as I am concerned about impending skin breakdown.  I discussed with Dr. Peyton Najjar. Post palliative radiation to the chest wall-systemic chemotherapy could be considered.  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  IV fluids today.  # schizophrenia: STABLE; T[facility] [phone]; Dr.Ahluwalia].   # DISPOSITION:  #  IVF 1 lit today. # 1 week- IVF 1 lit # follow up in 2 weeks- MD; labs- cbc/cmp; ca15-3; ca-27-29; cea;  IVFs 1 lit- Dr.B

## 2020-12-21 NOTE — Progress Notes (Signed)
Pt received hydration with 1 liter of NS today. At completion discharged to home. VSS. Accompanied by her mother.

## 2020-12-21 NOTE — Progress Notes (Signed)
[name] CONSULT NOTE  Patient Care Team: Physicians, [place] as PCP - General Vladimir Crofts, MD as Consulting Physician (Neurology) Cammie Sickle, MD as Consulting Physician (Internal Medicine) Randel Pigg, MD as Consulting Physician (Psychiatry) Herbert Pun, MD as Consulting Physician (General Surgery)  CHIEF COMPLAINTS/PURPOSE OF CONSULTATION: Breast cancer  #  Oncology History Overview Note  # NOV 2021- LEFT BREAST CANCER - [place]; TRIPLE NEGATIVE; Grade of invasive carcinoma: Grade 3; # LYMPH NODE, LEFT AXILLA; [Dr.Cintron]; ULTRASOUND-GUIDED CORE BIOPSY:  - METASTATIC MAMMARY CARCINOMA, AT LEAST 9 MM METASTASIS. ]; DEC 9th PET scan-Intensely hypermetabolic 09.2 cm left breast mass compatible with primary left breast malignancy, with left pectoralis muscle involvement posteriorly and cutaneous involvement by the mass. Multiple smaller hypermetabolic satellite malignant nodules scattered throughout the left breast. Bulky hypermetabolic left axillary and left retropectoral nodal metastases.   # 12/10- Carboplatin ONLY;  Taxol hypersensitivity reaction-discontinued  # 12/17- AC #1 q 3 W x4; MARCH 28th- CARBO-ABRAXANE-KEYTRUDA; #cycle 1-8 [abraxane dose reduced to 75/m2- sec to diarrhea-3]  DIAGNOSIS:  A. LEFT BREAST; RADICAL MASTECTOMY:  - INVASIVE MAMMARY CARCINOMA.  - METASTATIC CARCINOMA INVOLVING ONE OF TWELVE LYMPH NODES (1/12).  - SEE CANCER SUMMARY BELOW.   B. AXILLARY TISSUE, LEFT; RESECTION:  - TWO LYMPH NODES, NEGATIVE FOR METASTATIC CARCINOMA (0/2).   CANCER CASE SUMMARY: INVASIVE CARCINOMA OF THE BREAST  Standard(s): AJCC-UICC 8   SPECIMEN  Procedure: Radical mastectomy  Specimen Laterality: Left   TUMOR  Histologic Type: Invasive mammary carcinoma of no special type  Histologic Grade (Nottingham Histologic Score)                       Glandular (Acinar)/Tubular Differentiation: 3                       Nuclear  Pleomorphism: 3                       Mitotic Rate: 3                       Overall Grade: 3  Tumor Size: 82 x 77 x 68 mm, separate 2 mm focus within randomly sampled  upper inner quadrant  Ductal Carcinoma In Situ (DCIS): Not identified  Lymphovascular Invasion: Present  Treatment Effect in the Breast: Treatment Effect in the Breast: No  definite response to presurgical therapy in the invasive carcinoma  Treatment Effect in the Lymph Nodes: Probable or definite response to  presurgical therapy in metastatic carcinoma  Residual Cancer Burden (RCB) from [facility] calculator (website  below):       Primary Tumor Bed            Primary tumor bed: 82 mm x 77 mm            Overall cancer cellularity: 100%            Percentage of cancer that is in situ: 0%       Lymph nodes            Number of lymph nodes positive for metastasis: 1            Diameter of largest metastasis: 8 mm       Residual Cancer Burden: 4.37       Residual Cancer Burden Class: RCB-III   [email or web address]    MARGINS  Margin Status for Invasive Carcinoma: All margins  negative for invasive  carcinoma                       Distance from closest margin: 3 mm                       Specify closest margin: Posterior   Margin Status for DCIS: Not applicable   REGIONAL LYMPH NODES  Regional Lymph Node Status: Tumor present in regional lymph node(s)                       Number of Lymph Nodes with Macrometastases (greater  than 2 mm): 1                       Number of Lymph Nodes with Micrometastases (greater  than 0.2 mm to 2 mm and/or greater than 200 cells): 0                       Number of Lymph Nodes with Isolated Tumor Cells  (0.2 mm or less OR 200 cells or less): 0                       Size of Largest Metastatic Deposit: 8 mm                       Extranodal Extension: Not identified                       Total Number of Lymph Nodes Examined (sentinel and   non-sentinel): 14                       Number of Sentinel Nodes Examined: 0  DISTANT METASTASIS  Distant Site(s) Involved, if applicable: Not applicable   PATHOLOGIC STAGE CLASSIFICATION (pTNM, AJCC 8th Edition):  TNM Descriptors: M (multifocal)  pT3  Regional Lymph Nodes Modifier: Not applicable  pN 1a  pM - Not applicable   # SURVIVORSHIP:   # GENETICS: p  DIAGNOSIS: Triple negative breast cancer  STAGE: Stage III       ;  GOALS: Cure  CURRENT/MOST RECENT THERAPY : AC    Carcinoma of overlapping sites of left breast in female, estrogen receptor negative ([place])  04/24/2020 Initial Diagnosis   Carcinoma of overlapping sites of left breast in female, estrogen receptor negative ([place])   05/04/2020 - 05/04/2020 Chemotherapy   The patient had dexamethasone (DECADRON) 4 MG tablet, 8 mg, Oral, Daily, 1 of 1 cycle, Start date: --, End date: -- palonosetron (ALOXI) injection 0.25 mg, 0.25 mg, Intravenous,  Once, 1 of 12 cycles Administration: 0.25 mg (05/04/2020) CARBOplatin (PARAPLATIN) 220 mg in sodium chloride 0.9 % 250 mL chemo infusion, 220 mg (100 % of original dose 222.4 mg), Intravenous,  Once, 1 of 12 cycles Dose modification:   (original dose 222.4 mg, Cycle 1) Administration: 220 mg (05/04/2020) PACLitaxel (TAXOL) 132 mg in sodium chloride 0.9 % 250 mL chemo infusion (</= $RemoveBefor'80mg'cjjXjpMgbjbH$ /m2), 80 mg/m2 = 132 mg, Intravenous,  Once, 1 of 12 cycles Administration: 132 mg (05/04/2020)   for chemotherapy treatment.     05/11/2020 - 07/31/2020 Chemotherapy          06/14/2020 Cancer Staging   Staging form: Breast, AJCC 8th Edition - Clinical: Stage IIIC (cT4, cN2b, cM0, G3, ER-, PR-, HER2-) - Signed by Charlaine Dalton  R, MD on 06/14/2020    08/20/2020 -  Chemotherapy    Patient is on Treatment Plan: BREAST PEMBROLIZUMAB + CARBOPLATIN D1 + ABRAXANEL D1,8,15 Q21D X 4 CYCLES         HISTORY OF PRESENTING ILLNESS: Patient is accompanied by her mother/patient is poor  historian. Tamara [name] 50 y.o.  female with schizophrenia and left locally advanced triple negative breast cancer T4 N2; currently s/p "partial" neoadjuvant chemotherapy with carboplatin and Abraxane Keytruda-s/p mastectomy/axillary for dissection is here for follow-up.  In the interim patient was evaluated by radiation oncology-recommended palliative radiation.   Patient denies any nausea vomiting.  She is concerned about her "bumps/lumps" in the postmastectomy site.  No headaches.   Review of Systems  Unable to perform ROS: Psychiatric disorder    MEDICAL HISTORY:  Past Medical History:  Diagnosis Date   Breast cancer ([place]) 03/2020   left   Family history of breast cancer    Family history of stomach cancer    Family history of stomach cancer    GERD (gastroesophageal reflux disease)    Hypothyroidism    IUD (intrauterine device) in place IUD was placed in 2016   Leiomyoma of uterus    Schizophrenia ([place])    Thyroid disease     SURGICAL HISTORY: Past Surgical History:  Procedure Laterality Date   ADJACENT TISSUE TRANSFER/TISSUE REARRANGEMENT N/A 10/29/2020   Procedure: POSSIBLE ADJACENT TISSUE TRANSFER, TISSUE REARRANGEMENT;  Surgeon: Wallace Going, DO;  Location: [facility] OR[facility];  Service: Plastics;  Laterality: N/A;   APPLICATION OF A-CELL OF CHEST/ABDOMEN N/A 10/29/2020   Procedure: POSSIBLE APPLICATION OF A-CELL OF CHEST/ABDOMEN;  Surgeon: Wallace Going, DO;  Location: [facility];  Service: Plastics;  Laterality: N/A;   APPLICATION OF WOUND VAC N/A 10/29/2020   Procedure: POSSIBLE APPLICATION OF WOUND VAC;  Surgeon: Wallace Going, DO;  Location: [facility];  Service: Plastics;  Laterality: N/A;   BREAST BIOPSY Left 03/2020   [facility]- Triple neg- Stage 3   PORTACATH PLACEMENT Right 05/02/2020   Procedure: INSERTION PORT-A-CATH;  Surgeon: Herbert Pun, MD;  Location: [facility] OR[facility];  Service: General;  Laterality: Right;   TOTAL MASTECTOMY Left 10/29/2020    Procedure: TOTAL MASTECTOMY --- RADICAL;  Surgeon: Herbert Pun, MD;  Location: [facility] OR[facility];  Service: General;  Laterality: Left;  needs 3 hrs    SOCIAL HISTORY: Social History   Socioeconomic History   Marital status: Single    Spouse name: Not on file   Number of children: Not on file   Years of education: Not on file   Highest education level: Not on file  Occupational History   Not on file  Tobacco Use   Smoking status: Never   Smokeless tobacco: Never  Vaping Use   Vaping Use: Never used  Substance and Sexual Activity   Alcohol use: No   Drug use: No   Sexual activity: Yes    Partners: Male    Birth control/protection: Surgical, Injection  Other Topics Concern   Not on file  Social History Narrative   Not on file   Social Determinants of Health   Financial Resource Strain: Not on file  Food Insecurity: Not on file  Transportation Needs: Not on file  Physical Activity: Not on file  Stress: Not on file  Social Connections: Not on file  Intimate Partner Violence: Not on file    FAMILY HISTORY: Family History  Problem Relation Age of Onset   Breast cancer Mother  Stomach cancer Maternal Grandfather    Other Maternal Aunt        died from tumor, brain?    ALLERGIES:  is allergic to paclitaxel.  MEDICATIONS:  Current Outpatient Medications  Medication Sig Dispense Refill   benztropine (COGENTIN) 0.5 MG tablet Take 1 tablet (0.5 mg total) by mouth 2 (two) times daily. 60 tablet 1   cetirizine (ZYRTEC) 10 MG tablet Take 1 tablet (10 mg total) by mouth daily as needed for allergies.     clonazePAM (KLONOPIN) 0.5 MG tablet Take 0.5 mg by mouth at bedtime.     divalproex (DEPAKOTE) 500 MG DR tablet Take 1 tablet (500 mg total) by mouth every 12 (twelve) hours. 60 tablet 1   fluticasone (FLONASE) 50 MCG/ACT nasal spray Place 1 spray into both nostrils daily. (Patient taking differently: Place 1 spray into both nostrils daily as needed for allergies.) 11.1  mL 9   haloperidol (HALDOL) 5 MG tablet Take 1 tablet (5 mg total) by mouth 3 (three) times daily. 90 tablet 1   haloperidol decanoate (HALDOL DECANOATE) 100 MG/ML injection Inject 100 mg into the muscle every 28 (twenty-eight) days.     hydrOXYzine (ATARAX/VISTARIL) 25 MG tablet Take 25 mg by mouth every 8 (eight) hours as needed.     levothyroxine (SYNTHROID) 88 MCG tablet TAKE 1 TABLET (88 MCG TOTAL) BY MOUTH DAILY AT 6 (SIX) AM. 30 tablet 1   lidocaine-prilocaine (EMLA) cream Apply 1 application topically as needed. 30 g 3   medroxyPROGESTERone (DEPO-PROVERA) 150 MG/ML injection Inject 150 mg into the muscle every 3 (three) months.     MELATONIN PO Take 1 tablet by mouth at bedtime as needed (sleep).     ondansetron (ZOFRAN) 8 MG tablet One pill every 8 hours as needed for nausea/vomitting. (Patient taking differently: Take 8 mg by mouth every 8 (eight) hours as needed for vomiting or nausea.) 40 tablet 1   potassium chloride SA (KLOR-CON M20) 20 MEQ tablet Take 1 tablet (20 mEq total) by mouth 2 (two) times daily. 60 tablet 0   prochlorperazine (COMPAZINE) 10 MG tablet Take 1 tablet (10 mg total) by mouth every 6 (six) hours as needed for nausea or vomiting. 40 tablet 1   sodium chloride 1 g tablet Take 1 tablet (1 g total) by mouth 3 (three) times daily. 90 tablet 0   HYDROcodone-acetaminophen (NORCO/VICODIN) 5-325 MG tablet Take 1 tablet by mouth every 4 (four) hours as needed for moderate pain. (Patient not taking: Reported on 12/21/2020) 20 tablet 0   No current facility-administered medications for this visit.   Facility-Administered Medications Ordered in Other Visits  Medication Dose Route Frequency Provider Last Rate Last Admin   sodium chloride flush (NS) 0.9 % injection 10 mL  10 mL Intravenous PRN Cammie Sickle, MD   10 mL at 06/14/20 0840      .  PHYSICAL EXAMINATION: ECOG PERFORMANCE STATUS: 1 - Symptomatic but completely ambulatory  Vitals:   12/21/20 0946  BP:  134/81  Pulse: 88  Resp: 18  Temp: (!) 97.1 F (36.2 C)  SpO2: 100%   Filed Weights   12/21/20 0946  Weight: 148 lb 6.4 oz (67.3 kg)    Physical Exam Constitutional:      Comments: Accompanied by mother.  Ambulating independently.  HENT:     Head: Normocephalic and atraumatic.     Mouth/Throat:     Pharynx: No oropharyngeal exudate.  Eyes:     Pupils: Pupils are equal, round,  and reactive to light.  Cardiovascular:     Rate and Rhythm: Normal rate and regular rhythm.  Pulmonary:     Effort: No respiratory distress.     Breath sounds: No wheezing.  Abdominal:     General: Bowel sounds are normal. There is no distension.     Palpations: Abdomen is soft. There is no mass.     Tenderness: There is no abdominal tenderness. There is no guarding or rebound.  Musculoskeletal:        General: No tenderness. Normal range of motion.     Cervical back: Normal range of motion and neck supple.  Skin:    General: Skin is warm.     Comments: Left postmastectomy incision -healing well.  However significant local progression noted/see images below  Neurological:     Mental Status: She is alert and oriented to person, place, and time.  Psychiatric:        Mood and Affect: Affect normal.     Comments: Poor insight/tangential thoughts.  Unable to make a coherent conversation-underlying schizophrenia.     LABORATORY DATA:  I have reviewed the data as listed Lab Results  Component Value Date   WBC 6.3 12/21/2020   HGB 11.5 (L) 12/21/2020   HCT 32.7 (L) 12/21/2020   MCV 91.3 12/21/2020   PLT 212 12/21/2020   Recent Labs    09/26/20 0754 10/02/20 0850 10/10/20 0755 10/30/20 0545 12/07/20 1241 12/21/20 0916  NA 128* 123*   < > 129* 121* 124*  K 3.6 3.1*   < > 3.6 3.7 3.8  CL 92* 86*   < > 98 90* 89*  CO2 25 27   < > 24 19* 25  GLUCOSE 87 81   < > 93 107* 95  BUN <5* 5*   < > <5* <5* 6  CREATININE 0.51 0.47   < > 0.64 0.54 0.67  CALCIUM 9.3 8.8*   < > 9.0 9.0 9.5  GFRNONAA  >60 >60   < > >60 >60 >60  PROT 7.2 6.7  --   --  7.2  --   ALBUMIN 3.7 3.8  --   --  3.7  --   AST 20 29  --   --  20  --   ALT 10 12  --   --  9  --   ALKPHOS 55 55  --   --  49  --   BILITOT 0.4 0.5  --   --  0.6  --    < > = values in this interval not displayed.    RADIOGRAPHIC STUDIES: I have personally reviewed the radiological images as listed and agreed with the findings in the report. No results found.     06/04/2020-     07/30/2020-    On 4/26-    09/26/2020    10/02/2020   5/25   12/04/2020   12/21/2020    ASSESSMENT & PLAN:   Carcinoma of overlapping sites of left breast in female, estrogen receptor negative ([place]) #Locally recurrent recurrent/stage IV -left breast cancer T4 N2 M1 [subcutaneous nodules] Triple negative-s/p neoadjuvant chemotherapy-currently noted to have significant local recurrence.  Patient also has significant progression in the last 2 weeks. [See images above].  #Patient has been evaluated by radiation oncology; awaiting radiation next couple of weeks.  Discussed with Dr. Donella Stade regarding expediting radiation as I am concerned about impending skin breakdown.  I discussed with Dr. Peyton Najjar. Post palliative radiation to the chest  wall-systemic chemotherapy could be considered.  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  IV fluids today.  # schizophrenia: STABLE; Tritnity [id number]; Dr.Ahluwalia].   # DISPOSITION:  #  IVF 1 lit today. # 1 week- IVF 1 lit # follow up in 2 weeks- MD; labs- cbc/cmp; ca15-3; ca-27-29; cea;  IVFs 1 lit- Dr.B     All questions were answered. The patient knows to call the clinic with any problems, questions or concerns.    Cammie Sickle, MD 12/22/2020 11:25 AM

## 2020-12-22 ENCOUNTER — Encounter: Payer: Self-pay | Admitting: Internal Medicine

## 2020-12-24 ENCOUNTER — Encounter: Payer: Self-pay | Admitting: Internal Medicine

## 2020-12-24 DIAGNOSIS — C50812 Malignant neoplasm of overlapping sites of left female breast: Secondary | ICD-10-CM | POA: Insufficient documentation

## 2020-12-24 DIAGNOSIS — Z79899 Other long term (current) drug therapy: Secondary | ICD-10-CM | POA: Insufficient documentation

## 2020-12-24 DIAGNOSIS — K219 Gastro-esophageal reflux disease without esophagitis: Secondary | ICD-10-CM | POA: Insufficient documentation

## 2020-12-24 DIAGNOSIS — Z923 Personal history of irradiation: Secondary | ICD-10-CM | POA: Insufficient documentation

## 2020-12-24 DIAGNOSIS — E871 Hypo-osmolality and hyponatremia: Secondary | ICD-10-CM | POA: Insufficient documentation

## 2020-12-24 DIAGNOSIS — Z510 Encounter for antineoplastic radiation therapy: Secondary | ICD-10-CM | POA: Insufficient documentation

## 2020-12-24 DIAGNOSIS — C779 Secondary and unspecified malignant neoplasm of lymph node, unspecified: Secondary | ICD-10-CM | POA: Insufficient documentation

## 2020-12-24 DIAGNOSIS — Z803 Family history of malignant neoplasm of breast: Secondary | ICD-10-CM | POA: Insufficient documentation

## 2020-12-24 DIAGNOSIS — Z800 Family history of malignant neoplasm of digestive organs: Secondary | ICD-10-CM | POA: Insufficient documentation

## 2020-12-24 DIAGNOSIS — F209 Schizophrenia, unspecified: Secondary | ICD-10-CM | POA: Insufficient documentation

## 2020-12-24 DIAGNOSIS — Z171 Estrogen receptor negative status [ER-]: Secondary | ICD-10-CM | POA: Insufficient documentation

## 2020-12-24 DIAGNOSIS — E039 Hypothyroidism, unspecified: Secondary | ICD-10-CM | POA: Insufficient documentation

## 2020-12-25 ENCOUNTER — Telehealth: Payer: Self-pay | Admitting: Internal Medicine

## 2020-12-25 ENCOUNTER — Ambulatory Visit
Admission: RE | Admit: 2020-12-25 | Discharge: 2020-12-25 | Disposition: A | Discharge status: Home / Self Care | Payer: Medicaid Other | Source: Ambulatory Visit | Attending: Radiation Oncology | Admitting: Radiation Oncology

## 2020-12-25 DIAGNOSIS — K219 Gastro-esophageal reflux disease without esophagitis: Secondary | ICD-10-CM | POA: Diagnosis not present

## 2020-12-25 DIAGNOSIS — Z510 Encounter for antineoplastic radiation therapy: Secondary | ICD-10-CM | POA: Diagnosis not present

## 2020-12-25 DIAGNOSIS — Z803 Family history of malignant neoplasm of breast: Secondary | ICD-10-CM | POA: Diagnosis not present

## 2020-12-25 DIAGNOSIS — E871 Hypo-osmolality and hyponatremia: Secondary | ICD-10-CM | POA: Diagnosis not present

## 2020-12-25 DIAGNOSIS — C779 Secondary and unspecified malignant neoplasm of lymph node, unspecified: Secondary | ICD-10-CM | POA: Diagnosis not present

## 2020-12-25 DIAGNOSIS — Z923 Personal history of irradiation: Secondary | ICD-10-CM | POA: Diagnosis not present

## 2020-12-25 DIAGNOSIS — C50812 Malignant neoplasm of overlapping sites of left female breast: Secondary | ICD-10-CM | POA: Diagnosis not present

## 2020-12-25 DIAGNOSIS — E039 Hypothyroidism, unspecified: Secondary | ICD-10-CM | POA: Diagnosis not present

## 2020-12-25 DIAGNOSIS — Z800 Family history of malignant neoplasm of digestive organs: Secondary | ICD-10-CM | POA: Diagnosis not present

## 2020-12-25 DIAGNOSIS — Z79899 Other long term (current) drug therapy: Secondary | ICD-10-CM | POA: Diagnosis not present

## 2020-12-25 DIAGNOSIS — F209 Schizophrenia, unspecified: Secondary | ICD-10-CM | POA: Diagnosis not present

## 2020-12-25 DIAGNOSIS — Z171 Estrogen receptor negative status [ER-]: Secondary | ICD-10-CM | POA: Insufficient documentation

## 2020-12-25 NOTE — Telephone Encounter (Signed)
On 8/02-I spoke to patient's Sister Tamara [name] regarding the significant local recurrence postmastectomy; the plan to start radiation.  Sister understands poor prognosis.  However continue radiation at this time and consider palliative chemotherapy postradiation.  Her- 2 neu-0; F-one no significant targets; RAD 51? PARP sensitive?   GB

## 2020-12-26 DIAGNOSIS — Z510 Encounter for antineoplastic radiation therapy: Secondary | ICD-10-CM | POA: Diagnosis not present

## 2020-12-27 ENCOUNTER — Ambulatory Visit
Admission: RE | Admit: 2020-12-27 | Discharge: 2020-12-27 | Disposition: A | Discharge status: Home / Self Care | Payer: Medicaid Other | Source: Ambulatory Visit | Attending: Radiation Oncology | Admitting: Radiation Oncology

## 2020-12-27 DIAGNOSIS — Z510 Encounter for antineoplastic radiation therapy: Secondary | ICD-10-CM | POA: Diagnosis not present

## 2020-12-28 ENCOUNTER — Inpatient Hospital Stay: Payer: Medicaid Other | Attending: Internal Medicine

## 2020-12-28 ENCOUNTER — Ambulatory Visit
Admission: RE | Admit: 2020-12-28 | Discharge: 2020-12-28 | Disposition: A | Discharge status: Home / Self Care | Payer: Medicaid Other | Source: Ambulatory Visit | Attending: Radiation Oncology | Admitting: Radiation Oncology

## 2020-12-28 VITALS — BP 137/85 | HR 100 | Temp 96.5°F | Resp 16

## 2020-12-28 DIAGNOSIS — Z510 Encounter for antineoplastic radiation therapy: Secondary | ICD-10-CM | POA: Diagnosis not present

## 2020-12-28 DIAGNOSIS — C50812 Malignant neoplasm of overlapping sites of left female breast: Secondary | ICD-10-CM

## 2020-12-28 MED ORDER — SODIUM CHLORIDE 0.9 % IV SOLN
Freq: Once | INTRAVENOUS | Status: AC
  Filled 2020-12-28: qty 250

## 2020-12-28 MED ORDER — HEPARIN SOD (PORK) LOCK FLUSH 100 UNIT/ML IV SOLN
500.0000 [IU] | Freq: Once | INTRAVENOUS | Status: AC | PRN
Start: 2020-12-28 — End: 2020-12-28
  Administered 2020-12-28: 500 [IU]
  Filled 2020-12-28: qty 5

## 2020-12-28 MED ORDER — SODIUM CHLORIDE 0.9% FLUSH
10.0000 mL | Freq: Once | INTRAVENOUS | Status: AC | PRN
  Administered 2020-12-28: 10 mL
  Filled 2020-12-28: qty 10

## 2020-12-28 NOTE — Progress Notes (Signed)
Pt received IV hydration today. Discharged to home. VSS. Accompanied by her mother.

## 2020-12-28 NOTE — Patient Instructions (Signed)

## 2020-12-31 ENCOUNTER — Ambulatory Visit
Admission: RE | Admit: 2020-12-31 | Discharge: 2020-12-31 | Disposition: A | Discharge status: Home / Self Care | Payer: Medicaid Other | Source: Ambulatory Visit | Attending: Radiation Oncology | Admitting: Radiation Oncology

## 2020-12-31 DIAGNOSIS — Z510 Encounter for antineoplastic radiation therapy: Secondary | ICD-10-CM | POA: Diagnosis not present

## 2021-01-01 ENCOUNTER — Ambulatory Visit
Admission: RE | Admit: 2021-01-01 | Discharge: 2021-01-01 | Disposition: A | Discharge status: Home / Self Care | Payer: Medicaid Other | Source: Ambulatory Visit | Attending: Radiation Oncology | Admitting: Radiation Oncology

## 2021-01-01 DIAGNOSIS — Z510 Encounter for antineoplastic radiation therapy: Secondary | ICD-10-CM | POA: Diagnosis not present

## 2021-01-02 ENCOUNTER — Ambulatory Visit
Admission: RE | Admit: 2021-01-02 | Discharge: 2021-01-02 | Disposition: A | Discharge status: Home / Self Care | Payer: Medicaid Other | Source: Ambulatory Visit | Attending: Radiation Oncology | Admitting: Radiation Oncology

## 2021-01-02 DIAGNOSIS — Z510 Encounter for antineoplastic radiation therapy: Secondary | ICD-10-CM | POA: Diagnosis not present

## 2021-01-03 ENCOUNTER — Other Ambulatory Visit: Payer: Self-pay | Admitting: *Deleted

## 2021-01-03 ENCOUNTER — Ambulatory Visit
Admission: RE | Admit: 2021-01-03 | Discharge: 2021-01-03 | Disposition: A | Discharge status: Home / Self Care | Payer: Medicaid Other | Source: Ambulatory Visit | Attending: Radiation Oncology | Admitting: Radiation Oncology

## 2021-01-03 ENCOUNTER — Other Ambulatory Visit: Payer: Self-pay | Admitting: Licensed Clinical Social Worker

## 2021-01-03 DIAGNOSIS — C50812 Malignant neoplasm of overlapping sites of left female breast: Secondary | ICD-10-CM

## 2021-01-03 DIAGNOSIS — Z171 Estrogen receptor negative status [ER-]: Secondary | ICD-10-CM

## 2021-01-03 DIAGNOSIS — Z510 Encounter for antineoplastic radiation therapy: Secondary | ICD-10-CM | POA: Diagnosis not present

## 2021-01-03 MED ORDER — SILVER SULFADIAZINE 1 % EX CREA: 1.0000 "application " | TOPICAL_CREAM | Freq: Every day | CUTANEOUS | 1 refills | Status: AC

## 2021-01-04 ENCOUNTER — Inpatient Hospital Stay (HOSPITAL_BASED_OUTPATIENT_CLINIC_OR_DEPARTMENT_OTHER): Payer: Medicaid Other | Admitting: Internal Medicine

## 2021-01-04 ENCOUNTER — Encounter: Payer: Self-pay | Admitting: Internal Medicine

## 2021-01-04 ENCOUNTER — Inpatient Hospital Stay: Payer: Medicaid Other

## 2021-01-04 ENCOUNTER — Ambulatory Visit
Admission: RE | Admit: 2021-01-04 | Discharge: 2021-01-04 | Disposition: A | Discharge status: Home / Self Care | Payer: Medicaid Other | Source: Ambulatory Visit | Attending: Radiation Oncology | Admitting: Radiation Oncology

## 2021-01-04 ENCOUNTER — Other Ambulatory Visit: Payer: Self-pay

## 2021-01-04 DIAGNOSIS — C50812 Malignant neoplasm of overlapping sites of left female breast: Secondary | ICD-10-CM | POA: Diagnosis not present

## 2021-01-04 DIAGNOSIS — Z171 Estrogen receptor negative status [ER-]: Secondary | ICD-10-CM

## 2021-01-04 DIAGNOSIS — Z510 Encounter for antineoplastic radiation therapy: Secondary | ICD-10-CM | POA: Diagnosis not present

## 2021-01-04 LAB — CBC WITH DIFFERENTIAL/PLATELET
Abs Immature Granulocytes: 0.07 10*3/uL (ref 0.00–0.07)
Basophils Absolute: 0 10*3/uL (ref 0.0–0.1)
Basophils Relative: 0 %
Eosinophils Absolute: 0 10*3/uL (ref 0.0–0.5)
Eosinophils Relative: 0 %
HCT: 31 % — ABNORMAL LOW (ref 36.0–46.0)
Hemoglobin: 10.8 g/dL — ABNORMAL LOW (ref 12.0–15.0)
Immature Granulocytes: 1 %
Lymphocytes Relative: 9 %
Lymphs Abs: 0.6 10*3/uL — ABNORMAL LOW (ref 0.7–4.0)
MCH: 31.5 pg (ref 26.0–34.0)
MCHC: 34.8 g/dL (ref 30.0–36.0)
MCV: 90.4 fL (ref 80.0–100.0)
Monocytes Absolute: 1.2 10*3/uL — ABNORMAL HIGH (ref 0.1–1.0)
Monocytes Relative: 20 %
Neutro Abs: 4.1 10*3/uL (ref 1.7–7.7)
Neutrophils Relative %: 70 %
Platelets: 186 10*3/uL (ref 150–400)
RBC: 3.43 MIL/uL — ABNORMAL LOW (ref 3.87–5.11)
RDW: 13.1 % (ref 11.5–15.5)
WBC: 6 10*3/uL (ref 4.0–10.5)
nRBC: 0 % (ref 0.0–0.2)

## 2021-01-04 LAB — COMPREHENSIVE METABOLIC PANEL
ALT: 7 U/L (ref 0–44)
AST: 15 U/L (ref 15–41)
Albumin: 3.3 g/dL — ABNORMAL LOW (ref 3.5–5.0)
Alkaline Phosphatase: 49 U/L (ref 38–126)
Anion gap: 9 (ref 5–15)
BUN: <5 mg/dL — ABNORMAL LOW (ref 6–20)
CO2: 25 mmol/L (ref 22–32)
Calcium: 9 mg/dL (ref 8.9–10.3)
Chloride: 90 mmol/L — ABNORMAL LOW (ref 98–111)
Creatinine, Ser: 0.57 mg/dL (ref 0.44–1.00)
GFR, Estimated: >60 mL/min (ref >60)
Glucose, Bld: 87 mg/dL (ref 70–99)
Potassium: 3.7 mmol/L (ref 3.5–5.1)
Sodium: 124 mmol/L — ABNORMAL LOW (ref 135–145)
Total Bilirubin: 0.4 mg/dL (ref 0.3–1.2)
Total Protein: 6.8 g/dL (ref 6.5–8.1)

## 2021-01-04 MED ORDER — SODIUM CHLORIDE 0.9% FLUSH
10.0000 mL | Freq: Once | INTRAVENOUS | Status: AC | PRN
  Administered 2021-01-04: 10 mL
  Filled 2021-01-04: qty 10

## 2021-01-04 MED ORDER — SODIUM CHLORIDE 0.9 % IV SOLN
Freq: Once | INTRAVENOUS | Status: AC
  Filled 2021-01-04: qty 250

## 2021-01-04 MED ORDER — HEPARIN SOD (PORK) LOCK FLUSH 100 UNIT/ML IV SOLN
500.0000 [IU] | Freq: Once | INTRAVENOUS | Status: AC | PRN
  Administered 2021-01-04: 500 [IU]
  Filled 2021-01-04: qty 5

## 2021-01-04 NOTE — Progress Notes (Signed)
[name] CONSULT NOTE  Patient Care Team: Physicians, [place] as PCP - General Vladimir Crofts, MD as Consulting Physician (Neurology) Cammie Sickle, MD as Consulting Physician (Internal Medicine) Randel Pigg, MD as Consulting Physician (Psychiatry) Herbert Pun, MD as Consulting Physician (General Surgery)  CHIEF COMPLAINTS/PURPOSE OF CONSULTATION: Breast cancer  #  Oncology History Overview Note  # NOV 2021- LEFT BREAST CANCER - [place]; TRIPLE NEGATIVE; Grade of invasive carcinoma: Grade 3; # LYMPH NODE, LEFT AXILLA; [Dr.Cintron]; ULTRASOUND-GUIDED CORE BIOPSY:  - METASTATIC MAMMARY CARCINOMA, AT LEAST 9 MM METASTASIS. ]; DEC 9th PET scan-Intensely hypermetabolic 09.2 cm left breast mass compatible with primary left breast malignancy, with left pectoralis muscle involvement posteriorly and cutaneous involvement by the mass. Multiple smaller hypermetabolic satellite malignant nodules scattered throughout the left breast. Bulky hypermetabolic left axillary and left retropectoral nodal metastases.   # 12/10- Carboplatin ONLY;  Taxol hypersensitivity reaction-discontinued  # 12/17- AC #1 q 3 W x4; MARCH 28th- CARBO-ABRAXANE-KEYTRUDA; #cycle 1-8 [abraxane dose reduced to 75/m2- sec to diarrhea-3]  DIAGNOSIS:  A. LEFT BREAST; RADICAL MASTECTOMY:  - INVASIVE MAMMARY CARCINOMA.  - METASTATIC CARCINOMA INVOLVING ONE OF TWELVE LYMPH NODES (1/12).  - SEE CANCER SUMMARY BELOW.   B. AXILLARY TISSUE, LEFT; RESECTION:  - TWO LYMPH NODES, NEGATIVE FOR METASTATIC CARCINOMA (0/2).   CANCER CASE SUMMARY: INVASIVE CARCINOMA OF THE BREAST  Standard(s): AJCC-UICC 8   SPECIMEN  Procedure: Radical mastectomy  Specimen Laterality: Left   TUMOR  Histologic Type: Invasive mammary carcinoma of no special type  Histologic Grade (Nottingham Histologic Score)                       Glandular (Acinar)/Tubular Differentiation: 3                       Nuclear  Pleomorphism: 3                       Mitotic Rate: 3                       Overall Grade: 3  Tumor Size: 82 x 77 x 68 mm, separate 2 mm focus within randomly sampled  upper inner quadrant  Ductal Carcinoma In Situ (DCIS): Not identified  Lymphovascular Invasion: Present  Treatment Effect in the Breast: Treatment Effect in the Breast: No  definite response to presurgical therapy in the invasive carcinoma  Treatment Effect in the Lymph Nodes: Probable or definite response to  presurgical therapy in metastatic carcinoma  Residual Cancer Burden (RCB) from [facility] calculator (website  below):       Primary Tumor Bed            Primary tumor bed: 82 mm x 77 mm            Overall cancer cellularity: 100%            Percentage of cancer that is in situ: 0%       Lymph nodes            Number of lymph nodes positive for metastasis: 1            Diameter of largest metastasis: 8 mm       Residual Cancer Burden: 4.37       Residual Cancer Burden Class: RCB-III   [email or web address]    MARGINS  Margin Status for Invasive Carcinoma: All margins  negative for invasive  carcinoma                       Distance from closest margin: 3 mm                       Specify closest margin: Posterior   Margin Status for DCIS: Not applicable   REGIONAL LYMPH NODES  Regional Lymph Node Status: Tumor present in regional lymph node(s)                       Number of Lymph Nodes with Macrometastases (greater  than 2 mm): 1                       Number of Lymph Nodes with Micrometastases (greater  than 0.2 mm to 2 mm and/or greater than 200 cells): 0                       Number of Lymph Nodes with Isolated Tumor Cells  (0.2 mm or less OR 200 cells or less): 0                       Size of Largest Metastatic Deposit: 8 mm                       Extranodal Extension: Not identified                       Total Number of Lymph Nodes Examined (sentinel and   non-sentinel): 14                       Number of Sentinel Nodes Examined: 0  DISTANT METASTASIS  Distant Site(s) Involved, if applicable: Not applicable   PATHOLOGIC STAGE CLASSIFICATION (pTNM, AJCC 8th Edition):  TNM Descriptors: M (multifocal)  pT3  Regional Lymph Nodes Modifier: Not applicable  pN 1a  pM - Not applicable   # NOV 1610-RUE4 (by immunohistochemistry): NEGATIVE (Score 0); Foundation 1: PD-L1 negative; no targets** RAD 51  # SURVIVORSHIP:   # GENETICS: p  DIAGNOSIS: Triple negative breast cancer  STAGE: Stage IV     ;  GOALS: palliative  CURRENT/MOST RECENT THERAPY : RT    Carcinoma of overlapping sites of left breast in female, estrogen receptor negative ([place])  04/24/2020 Initial Diagnosis   Carcinoma of overlapping sites of left breast in female, estrogen receptor negative ([place])   05/04/2020 - 05/04/2020 Chemotherapy   The patient had dexamethasone (DECADRON) 4 MG tablet, 8 mg, Oral, Daily, 1 of 1 cycle, Start date: --, End date: -- palonosetron (ALOXI) injection 0.25 mg, 0.25 mg, Intravenous,  Once, 1 of 12 cycles Administration: 0.25 mg (05/04/2020) CARBOplatin (PARAPLATIN) 220 mg in sodium chloride 0.9 % 250 mL chemo infusion, 220 mg (100 % of original dose 222.4 mg), Intravenous,  Once, 1 of 12 cycles Dose modification:   (original dose 222.4 mg, Cycle 1) Administration: 220 mg (05/04/2020) PACLitaxel (TAXOL) 132 mg in sodium chloride 0.9 % 250 mL chemo infusion (</= 44m/m2), 80 mg/m2 = 132 mg, Intravenous,  Once, 1 of 12 cycles Administration: 132 mg (05/04/2020)   for chemotherapy treatment.     05/11/2020 - 07/31/2020 Chemotherapy          06/14/2020 Cancer Staging   Staging form: Breast, AJCC 8th Edition -  Clinical: Stage IIIC (cT4, cN2b, cM0, G3, ER-, PR-, HER2-) - Signed by Cammie Sickle, MD on 06/14/2020   08/20/2020 -  Chemotherapy    Patient is on Treatment Plan: BREAST PEMBROLIZUMAB + CARBOPLATIN D1 + ABRAXANEL D1,8,15 Q21D X  4 CYCLES         HISTORY OF PRESENTING ILLNESS: Patient is accompanied by her mother/patient is poor historian. Tamara [name] 50 y.o.  female with schizophrenia and left locally advanced/metastatic triple negative breast cancer currently undergoing palliative radiation is here for follow-up.  Patient denies any nausea vomiting headache.  However continues to notice increasing bumps around the mastectomy site.  Review of Systems  Unable to perform ROS: Psychiatric disorder    MEDICAL HISTORY:  Past Medical History:  Diagnosis Date   Breast cancer ([place]) 03/2020   left   Family history of breast cancer    Family history of stomach cancer    Family history of stomach cancer    GERD (gastroesophageal reflux disease)    Hypothyroidism    IUD (intrauterine device) in place IUD was placed in 2016   Leiomyoma of uterus    Schizophrenia ([place])    Thyroid disease     SURGICAL HISTORY: Past Surgical History:  Procedure Laterality Date   ADJACENT TISSUE TRANSFER/TISSUE REARRANGEMENT N/A 10/29/2020   Procedure: POSSIBLE ADJACENT TISSUE TRANSFER, TISSUE REARRANGEMENT;  Surgeon: Wallace Going, DO;  Location: [facility] ORS;  Service: Plastics;  Laterality: N/A;   APPLICATION OF A-CELL OF CHEST/ABDOMEN N/A 10/29/2020   Procedure: POSSIBLE APPLICATION OF A-CELL OF CHEST/ABDOMEN;  Surgeon: Wallace Going, DO;  Location: [facility] OR[facility];  Service: Plastics;  Laterality: N/A;   APPLICATION OF WOUND VAC N/A 10/29/2020   Procedure: POSSIBLE APPLICATION OF WOUND VAC;  Surgeon: Wallace Going, DO;  Location: [facility] OR[facility];  Service: Plastics;  Laterality: N/A;   BREAST BIOPSY Left 03/2020   [facility]- Triple neg- Stage 3   PORTACATH PLACEMENT Right 05/02/2020   Procedure: INSERTION PORT-A-CATH;  Surgeon: Herbert Pun, MD;  Location: [facility] ORS;  Service: General;  Laterality: Right;   TOTAL MASTECTOMY Left 10/29/2020   Procedure: TOTAL MASTECTOMY --- RADICAL;  Surgeon: Herbert Pun, MD;   Location: [facility] ORS;  Service: General;  Laterality: Left;  needs 3 hrs    SOCIAL HISTORY: Social History   Socioeconomic History   Marital status: Single    Spouse name: Not on file   Number of children: Not on file   Years of education: Not on file   Highest education level: Not on file  Occupational History   Not on file  Tobacco Use   Smoking status: Never   Smokeless tobacco: Never  Vaping Use   Vaping Use: Never used  Substance and Sexual Activity   Alcohol use: No   Drug use: No   Sexual activity: Yes    Partners: Male    Birth control/protection: Surgical, Injection  Other Topics Concern   Not on file  Social History Narrative   Not on file   Social Determinants of Health   Financial Resource Strain: Not on file  Food Insecurity: Not on file  Transportation Needs: Not on file  Physical Activity: Not on file  Stress: Not on file  Social Connections: Not on file  Intimate Partner Violence: Not on file    FAMILY HISTORY: Family History  Problem Relation Age of Onset   Breast cancer Mother    Stomach cancer Maternal Grandfather    Other Maternal Aunt  died from tumor, brain?    ALLERGIES:  is allergic to paclitaxel.  MEDICATIONS:  Current Outpatient Medications  Medication Sig Dispense Refill   benztropine (COGENTIN) 0.5 MG tablet Take 1 tablet (0.5 mg total) by mouth 2 (two) times daily. 60 tablet 1   cetirizine (ZYRTEC) 10 MG tablet Take 1 tablet (10 mg total) by mouth daily as needed for allergies.     clonazePAM (KLONOPIN) 0.5 MG tablet Take 0.5 mg by mouth at bedtime.     divalproex (DEPAKOTE) 500 MG DR tablet Take 1 tablet (500 mg total) by mouth every 12 (twelve) hours. 60 tablet 1   fluticasone (FLONASE) 50 MCG/ACT nasal spray Place 1 spray into both nostrils daily. (Patient taking differently: Place 1 spray into both nostrils daily as needed for allergies.) 11.1 mL 9   haloperidol (HALDOL) 5 MG tablet Take 1 tablet (5 mg total) by mouth 3  (three) times daily. 90 tablet 1   haloperidol decanoate (HALDOL DECANOATE) 100 MG/ML injection Inject 100 mg into the muscle every 28 (twenty-eight) days.     HYDROcodone-acetaminophen (NORCO/VICODIN) 5-325 MG tablet Take 1 tablet by mouth every 4 (four) hours as needed for moderate pain. 20 tablet 0   hydrOXYzine (ATARAX/VISTARIL) 25 MG tablet Take 25 mg by mouth every 8 (eight) hours as needed.     KLOR-CON M20 20 MEQ tablet TAKE 1 TABLET BY MOUTH TWICE A DAY 60 tablet 0   levothyroxine (SYNTHROID) 88 MCG tablet TAKE 1 TABLET (88 MCG TOTAL) BY MOUTH DAILY AT 6 (SIX) AM. 30 tablet 1   lidocaine-prilocaine (EMLA) cream Apply 1 application topically as needed. 30 g 3   medroxyPROGESTERone (DEPO-PROVERA) 150 MG/ML injection Inject 150 mg into the muscle every 3 (three) months.     MELATONIN PO Take 1 tablet by mouth at bedtime as needed (sleep).     ondansetron (ZOFRAN) 8 MG tablet One pill every 8 hours as needed for nausea/vomitting. (Patient taking differently: Take 8 mg by mouth every 8 (eight) hours as needed for vomiting or nausea.) 40 tablet 1   prochlorperazine (COMPAZINE) 10 MG tablet Take 1 tablet (10 mg total) by mouth every 6 (six) hours as needed for nausea or vomiting. 40 tablet 1   silver sulfADIAZINE (SILVADENE) 1 % cream Apply 1 application topically daily. 50 g 1   sodium chloride 1 g tablet Take 1 tablet (1 g total) by mouth 3 (three) times daily. 90 tablet 0   No current facility-administered medications for this visit.   Facility-Administered Medications Ordered in Other Visits  Medication Dose Route Frequency Provider Last Rate Last Admin   sodium chloride flush (NS) 0.9 % injection 10 mL  10 mL Intravenous PRN Cammie Sickle, MD   10 mL at 06/14/20 0840      .  PHYSICAL EXAMINATION: ECOG PERFORMANCE STATUS: 1 - Symptomatic but completely ambulatory  Vitals:   01/04/21 0840  BP: (!) 121/45  Pulse: 90  Resp: 20  Temp: (!) 97.5 F (36.4 C)   Filed Weights    01/04/21 0840  Weight: 150 lb (68 kg)    Physical Exam Constitutional:      Comments: Accompanied by mother.  Ambulating independently.  HENT:     Head: Normocephalic and atraumatic.     Mouth/Throat:     Pharynx: No oropharyngeal exudate.  Eyes:     Pupils: Pupils are equal, round, and reactive to light.  Cardiovascular:     Rate and Rhythm: Normal rate and regular rhythm.  Pulmonary:     Effort: No respiratory distress.     Breath sounds: No wheezing.  Abdominal:     General: Bowel sounds are normal. There is no distension.     Palpations: Abdomen is soft. There is no mass.     Tenderness: no abdominal tenderness There is no guarding or rebound.  Musculoskeletal:        General: No tenderness. Normal range of motion.     Cervical back: Normal range of motion and neck supple.  Skin:    General: Skin is warm.     Comments: Left postmastectomy incision -healing well.  However significant local progression noted/see images below  Neurological:     Mental Status: She is alert and oriented to person, place, and time.  Psychiatric:        Mood and Affect: Affect normal.     Comments: Poor insight/tangential thoughts.  Unable to make a coherent conversation-underlying schizophrenia.     LABORATORY DATA:  I have reviewed the data as listed Lab Results  Component Value Date   WBC 6.0 01/04/2021   HGB 10.8 (L) 01/04/2021   HCT 31.0 (L) 01/04/2021   MCV 90.4 01/04/2021   PLT 186 01/04/2021   Recent Labs    10/02/20 0850 10/10/20 0755 12/07/20 1241 12/21/20 0916 01/04/21 0830  NA 123*   < > 121* 124* 124*  K 3.1*   < > 3.7 3.8 3.7  CL 86*   < > 90* 89* 90*  CO2 27   < > 19* 25 25  GLUCOSE 81   < > 107* 95 87  BUN 5*   < > <5* 6 <5*  CREATININE 0.47   < > 0.54 0.67 0.57  CALCIUM 8.8*   < > 9.0 9.5 9.0  GFRNONAA >60   < > >60 >60 >60  PROT 6.7  --  7.2  --  6.8  ALBUMIN 3.8  --  3.7  --  3.3*  AST 29  --  20  --  15  ALT 12  --  9  --  7  ALKPHOS 55  --  49  --   49  BILITOT 0.5  --  0.6  --  0.4   < > = values in this interval not displayed.    RADIOGRAPHIC STUDIES: I have personally reviewed the radiological images as listed and agreed with the findings in the report. No results found.     06/04/2020-     07/30/2020-    On 4/26-    09/26/2020    10/02/2020   5/25   12/04/2020   12/21/2020     ASSESSMENT & PLAN:   Carcinoma of overlapping sites of left breast in female, estrogen receptor negative ([place]) #Locally recurrent recurrent/stage IV -left breast cancer T4 N2 M1 [subcutaneous nodules].  Currently undergoing palliative RT [08/04- 9/22]. post palliative radiation to the chest wall-systemic chemotherapy [sacituziumab govi] could be considered.  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  Sodium today 123 stable; discussed re: free water restriction;  Potassium 3.1; continue 20 meq twice daily at home.  IV fluids today.  # schizophrenia: STABLE; Tritnity [phone]; Dr.Ahluwalia].   # DISPOSITION:  #  IVF 1 lit today. # 1 week- IVF 1 lit # follow up in 2 weeks- MD; labs- cbc/cmp; ca15-3; ca-27-29; cea;  IVFs 1 lit- Dr.B     All questions were answered. The patient knows to call the clinic with any problems, questions or concerns.  Cammie Sickle, MD 01/05/2021 10:19 AM

## 2021-01-04 NOTE — Progress Notes (Signed)
Patient received 1 liter of NS for hydration. VSS. Discharged to home at completion of fluids.

## 2021-01-04 NOTE — Assessment & Plan Note (Addendum)
#  Locally recurrent recurrent/stage IV -left breast cancer T4 N2 M1 [subcutaneous nodules].  Currently undergoing palliative RT [08/04- 9/22]. post palliative radiation to the chest wall-systemic chemotherapy [sacituziumab govi] could be considered.  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  Sodium today 123 stable; discussed re: free water restriction;  Potassium 3.1; continue 20 meq twice daily at home.  IV fluids today.  # schizophrenia: STABLE; Tritnity [phone]; Dr.Ahluwalia].   # DISPOSITION:  #  IVF 1 lit today. # 1 week- IVF 1 lit # follow up in 2 weeks- MD; labs- cbc/cmp; ca15-3; ca-27-29; cea;  IVFs 1 lit- Dr.B

## 2021-01-05 ENCOUNTER — Encounter: Payer: Self-pay | Admitting: Internal Medicine

## 2021-01-05 LAB — CEA: CEA: 0.9 ng/mL (ref 0.0–4.7)

## 2021-01-05 LAB — CANCER ANTIGEN 15-3: CA 15-3: 45.6 U/mL — ABNORMAL HIGH (ref 0.0–25.0)

## 2021-01-05 LAB — CANCER ANTIGEN 27.29: CA 27.29: 57.5 U/mL — ABNORMAL HIGH (ref 0.0–38.6)

## 2021-01-05 MED ORDER — SODIUM CHLORIDE 0.9 % IV SOLN
INTRAVENOUS | Status: DC | PRN
  Filled 2021-01-05: qty 250

## 2021-01-07 ENCOUNTER — Ambulatory Visit
Admission: RE | Admit: 2021-01-07 | Discharge: 2021-01-07 | Disposition: A | Discharge status: Home / Self Care | Payer: Medicaid Other | Source: Ambulatory Visit | Attending: Radiation Oncology | Admitting: Radiation Oncology

## 2021-01-07 DIAGNOSIS — Z510 Encounter for antineoplastic radiation therapy: Secondary | ICD-10-CM | POA: Diagnosis not present

## 2021-01-08 ENCOUNTER — Ambulatory Visit
Admission: RE | Admit: 2021-01-08 | Discharge: 2021-01-08 | Disposition: A | Discharge status: Home / Self Care | Payer: Medicaid Other | Source: Ambulatory Visit | Attending: Radiation Oncology | Admitting: Radiation Oncology

## 2021-01-08 DIAGNOSIS — Z510 Encounter for antineoplastic radiation therapy: Secondary | ICD-10-CM | POA: Diagnosis not present

## 2021-01-09 ENCOUNTER — Ambulatory Visit
Admission: RE | Admit: 2021-01-09 | Discharge: 2021-01-09 | Disposition: A | Discharge status: Home / Self Care | Payer: Medicaid Other | Source: Ambulatory Visit | Attending: Radiation Oncology | Admitting: Radiation Oncology

## 2021-01-09 DIAGNOSIS — Z510 Encounter for antineoplastic radiation therapy: Secondary | ICD-10-CM | POA: Diagnosis not present

## 2021-01-10 ENCOUNTER — Ambulatory Visit
Admission: RE | Admit: 2021-01-10 | Discharge: 2021-01-10 | Disposition: A | Discharge status: Home / Self Care | Payer: Medicaid Other | Source: Ambulatory Visit | Attending: Radiation Oncology | Admitting: Radiation Oncology

## 2021-01-10 DIAGNOSIS — Z510 Encounter for antineoplastic radiation therapy: Secondary | ICD-10-CM | POA: Diagnosis not present

## 2021-01-11 ENCOUNTER — Inpatient Hospital Stay: Payer: Medicaid Other

## 2021-01-11 ENCOUNTER — Ambulatory Visit
Admission: RE | Admit: 2021-01-11 | Discharge: 2021-01-11 | Disposition: A | Discharge status: Home / Self Care | Payer: Medicaid Other | Source: Ambulatory Visit | Attending: Radiation Oncology | Admitting: Radiation Oncology

## 2021-01-11 VITALS — BP 118/58 | HR 90 | Temp 99.1°F | Resp 18

## 2021-01-11 DIAGNOSIS — Z510 Encounter for antineoplastic radiation therapy: Secondary | ICD-10-CM | POA: Diagnosis not present

## 2021-01-11 DIAGNOSIS — Z171 Estrogen receptor negative status [ER-]: Secondary | ICD-10-CM

## 2021-01-11 MED ORDER — SODIUM CHLORIDE 0.9% FLUSH
10.0000 mL | Freq: Once | INTRAVENOUS | Status: DC | PRN
  Filled 2021-01-11: qty 10

## 2021-01-11 MED ORDER — SODIUM CHLORIDE 0.9 % IV SOLN
Freq: Once | INTRAVENOUS | Status: AC
  Filled 2021-01-11: qty 250

## 2021-01-11 MED ORDER — HEPARIN SOD (PORK) LOCK FLUSH 100 UNIT/ML IV SOLN
500.0000 [IU] | Freq: Once | INTRAVENOUS | Status: AC | PRN
  Administered 2021-01-11: 500 [IU]
  Filled 2021-01-11: qty 5

## 2021-01-14 ENCOUNTER — Ambulatory Visit
Admission: RE | Admit: 2021-01-14 | Discharge: 2021-01-14 | Disposition: A | Discharge status: Home / Self Care | Payer: Medicaid Other | Source: Ambulatory Visit | Attending: Radiation Oncology | Admitting: Radiation Oncology

## 2021-01-14 DIAGNOSIS — Z510 Encounter for antineoplastic radiation therapy: Secondary | ICD-10-CM | POA: Diagnosis not present

## 2021-01-15 ENCOUNTER — Ambulatory Visit
Admission: RE | Admit: 2021-01-15 | Discharge: 2021-01-15 | Disposition: A | Discharge status: Home / Self Care | Payer: Medicaid Other | Source: Ambulatory Visit | Attending: Radiation Oncology | Admitting: Radiation Oncology

## 2021-01-15 DIAGNOSIS — Z510 Encounter for antineoplastic radiation therapy: Secondary | ICD-10-CM | POA: Diagnosis not present

## 2021-01-16 ENCOUNTER — Ambulatory Visit
Admission: RE | Admit: 2021-01-16 | Discharge: 2021-01-16 | Disposition: A | Discharge status: Home / Self Care | Payer: Medicaid Other | Source: Ambulatory Visit | Attending: Radiation Oncology | Admitting: Radiation Oncology

## 2021-01-16 DIAGNOSIS — Z510 Encounter for antineoplastic radiation therapy: Secondary | ICD-10-CM | POA: Diagnosis not present

## 2021-01-17 ENCOUNTER — Ambulatory Visit
Admission: RE | Admit: 2021-01-17 | Discharge: 2021-01-17 | Disposition: A | Discharge status: Home / Self Care | Payer: Medicaid Other | Source: Ambulatory Visit | Attending: Radiation Oncology | Admitting: Radiation Oncology

## 2021-01-17 DIAGNOSIS — Z510 Encounter for antineoplastic radiation therapy: Secondary | ICD-10-CM | POA: Diagnosis not present

## 2021-01-18 ENCOUNTER — Inpatient Hospital Stay: Payer: Medicaid Other

## 2021-01-18 ENCOUNTER — Ambulatory Visit
Admission: RE | Admit: 2021-01-18 | Discharge: 2021-01-18 | Disposition: A | Discharge status: Home / Self Care | Payer: Medicaid Other | Source: Ambulatory Visit | Attending: Radiation Oncology | Admitting: Radiation Oncology

## 2021-01-18 ENCOUNTER — Inpatient Hospital Stay (HOSPITAL_BASED_OUTPATIENT_CLINIC_OR_DEPARTMENT_OTHER): Payer: Medicaid Other | Admitting: Internal Medicine

## 2021-01-18 ENCOUNTER — Encounter: Payer: Self-pay | Admitting: Internal Medicine

## 2021-01-18 ENCOUNTER — Other Ambulatory Visit: Payer: Self-pay

## 2021-01-18 DIAGNOSIS — C50812 Malignant neoplasm of overlapping sites of left female breast: Secondary | ICD-10-CM | POA: Diagnosis not present

## 2021-01-18 DIAGNOSIS — Z171 Estrogen receptor negative status [ER-]: Secondary | ICD-10-CM | POA: Diagnosis not present

## 2021-01-18 DIAGNOSIS — Z510 Encounter for antineoplastic radiation therapy: Secondary | ICD-10-CM | POA: Diagnosis not present

## 2021-01-18 LAB — CBC WITH DIFFERENTIAL/PLATELET
Abs Immature Granulocytes: 0.07 10*3/uL (ref 0.00–0.07)
Basophils Absolute: 0 10*3/uL (ref 0.0–0.1)
Basophils Relative: 0 %
Eosinophils Absolute: 0 10*3/uL (ref 0.0–0.5)
Eosinophils Relative: 0 %
HCT: 31.7 % — ABNORMAL LOW (ref 36.0–46.0)
Hemoglobin: 11.3 g/dL — ABNORMAL LOW (ref 12.0–15.0)
Immature Granulocytes: 1 %
Lymphocytes Relative: 7 %
Lymphs Abs: 0.4 10*3/uL — ABNORMAL LOW (ref 0.7–4.0)
MCH: 31.1 pg (ref 26.0–34.0)
MCHC: 35.6 g/dL (ref 30.0–36.0)
MCV: 87.3 fL (ref 80.0–100.0)
Monocytes Absolute: 1.3 10*3/uL — ABNORMAL HIGH (ref 0.1–1.0)
Monocytes Relative: 22 %
Neutro Abs: 4.1 10*3/uL (ref 1.7–7.7)
Neutrophils Relative %: 70 %
Platelets: 246 10*3/uL (ref 150–400)
RBC: 3.63 MIL/uL — ABNORMAL LOW (ref 3.87–5.11)
RDW: 13.2 % (ref 11.5–15.5)
WBC: 5.9 10*3/uL (ref 4.0–10.5)
nRBC: 0 % (ref 0.0–0.2)

## 2021-01-18 LAB — COMPREHENSIVE METABOLIC PANEL
ALT: 8 U/L (ref 0–44)
AST: 14 U/L — ABNORMAL LOW (ref 15–41)
Albumin: 3.4 g/dL — ABNORMAL LOW (ref 3.5–5.0)
Alkaline Phosphatase: 49 U/L (ref 38–126)
Anion gap: 9 (ref 5–15)
BUN: <5 mg/dL — ABNORMAL LOW (ref 6–20)
CO2: 27 mmol/L (ref 22–32)
Calcium: 9.1 mg/dL (ref 8.9–10.3)
Chloride: 89 mmol/L — ABNORMAL LOW (ref 98–111)
Creatinine, Ser: 0.54 mg/dL (ref 0.44–1.00)
GFR, Estimated: >60 mL/min (ref >60)
Glucose, Bld: 77 mg/dL (ref 70–99)
Potassium: 3.3 mmol/L — ABNORMAL LOW (ref 3.5–5.1)
Sodium: 125 mmol/L — ABNORMAL LOW (ref 135–145)
Total Bilirubin: 0.4 mg/dL (ref 0.3–1.2)
Total Protein: 7 g/dL (ref 6.5–8.1)

## 2021-01-18 MED ORDER — HEPARIN SOD (PORK) LOCK FLUSH 100 UNIT/ML IV SOLN
500.0000 [IU] | Freq: Once | INTRAVENOUS | Status: AC | PRN
  Administered 2021-01-18: 500 [IU]
  Filled 2021-01-18: qty 5

## 2021-01-18 MED ORDER — SODIUM CHLORIDE 0.9 % IV SOLN
Freq: Once | INTRAVENOUS | Status: AC
  Filled 2021-01-18: qty 250

## 2021-01-18 MED ORDER — HYDROXYZINE HCL 25 MG PO TABS: 25.0000 mg | ORAL_TABLET | Freq: Three times a day (TID) | ORAL | 3 refills | Status: AC | PRN

## 2021-01-18 MED ORDER — SODIUM CHLORIDE 0.9% FLUSH
10.0000 mL | Freq: Once | INTRAVENOUS | Status: AC | PRN
  Administered 2021-01-18: 10 mL
  Filled 2021-01-18: qty 10

## 2021-01-18 NOTE — Assessment & Plan Note (Addendum)
#  Locally recurrent recurrent/stage IV -left breast cancer T4 N2 M1 [subcutaneous nodules].  Currently undergoing palliative RT [08/04- 9/22].  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  #Given the progression noted in the left breast/new possible disease in the right-we will discuss with Dr. Donella Stade regarding clinically radiation versus systemic therapy.  Also discussed with Dr. Peyton Najjar regarding a punch biopsy of the right breast.  Discussed with the patient's mother.  #I would consider systemic chemotherapy [sacituziumab govi] -given the progressive disease; discussion awaited.  Surgery/radiation oncology. Will need interm scan to evaluate for visceral disease.   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  Sodium today 123 stable; discussed re: free water restriction;  Potassium 3.1; continue 20 meq twice daily at home.  IV fluids today.  # schizophrenia: STABLE; Tritnity [phone]; Dr.Ahluwalia].  Itching-hydroxyzine prescribed.  #Given progressive disease-recommend palliative care evaluation/next week-regarding CODE STATUS.   # DISPOSITION:  #  IVF 1 lit today.  # 1 week- IVF 1 lit # follow up in 1 weeks- MD; labs- cbc/cmp; ca15-3; ca-27-29; cea;  IVFs 1 lit- Dr.B  # 40 minutes face-to-face with the patient discussing the above plan of care; more than 50% of time spent on prognosis/ natural history; counseling and coordination.

## 2021-01-18 NOTE — Progress Notes (Signed)
[name] CONSULT NOTE  Patient Care Team: Physicians, [place] as PCP - General Vladimir Crofts, MD as Consulting Physician (Neurology) Cammie Sickle, MD as Consulting Physician (Internal Medicine) Randel Pigg, MD as Consulting Physician (Psychiatry) Herbert Pun, MD as Consulting Physician (General Surgery)  CHIEF COMPLAINTS/PURPOSE OF CONSULTATION: Breast cancer  #  Oncology History Overview Note  # NOV 2021- LEFT BREAST CANCER - [place]; TRIPLE NEGATIVE; Grade of invasive carcinoma: Grade 3; # LYMPH NODE, LEFT AXILLA; [Dr.Cintron]; ULTRASOUND-GUIDED CORE BIOPSY:  - METASTATIC MAMMARY CARCINOMA, AT LEAST 9 MM METASTASIS. ]; DEC 9th PET scan-Intensely hypermetabolic 09.2 cm left breast mass compatible with primary left breast malignancy, with left pectoralis muscle involvement posteriorly and cutaneous involvement by the mass. Multiple smaller hypermetabolic satellite malignant nodules scattered throughout the left breast. Bulky hypermetabolic left axillary and left retropectoral nodal metastases.   # 12/10- Carboplatin ONLY;  Taxol hypersensitivity reaction-discontinued  # 12/17- AC #1 q 3 W x4; MARCH 28th- CARBO-ABRAXANE-KEYTRUDA; #cycle 1-8 [abraxane dose reduced to 75/m2- sec to diarrhea-3]  DIAGNOSIS:  A. LEFT BREAST; RADICAL MASTECTOMY:  - INVASIVE MAMMARY CARCINOMA.  - METASTATIC CARCINOMA INVOLVING ONE OF TWELVE LYMPH NODES (1/12).  - SEE CANCER SUMMARY BELOW.   B. AXILLARY TISSUE, LEFT; RESECTION:  - TWO LYMPH NODES, NEGATIVE FOR METASTATIC CARCINOMA (0/2).   CANCER CASE SUMMARY: INVASIVE CARCINOMA OF THE BREAST  Standard(s): AJCC-UICC 8   SPECIMEN  Procedure: Radical mastectomy  Specimen Laterality: Left   TUMOR  Histologic Type: Invasive mammary carcinoma of no special type  Histologic Grade (Nottingham Histologic Score)                       Glandular (Acinar)/Tubular Differentiation: 3                       Nuclear  Pleomorphism: 3                       Mitotic Rate: 3                       Overall Grade: 3  Tumor Size: 82 x 77 x 68 mm, separate 2 mm focus within randomly sampled  upper inner quadrant  Ductal Carcinoma In Situ (DCIS): Not identified  Lymphovascular Invasion: Present  Treatment Effect in the Breast: Treatment Effect in the Breast: No  definite response to presurgical therapy in the invasive carcinoma  Treatment Effect in the Lymph Nodes: Probable or definite response to  presurgical therapy in metastatic carcinoma  Residual Cancer Burden (RCB) from [facility] calculator (website  below):       Primary Tumor Bed            Primary tumor bed: 82 mm x 77 mm            Overall cancer cellularity: 100%            Percentage of cancer that is in situ: 0%       Lymph nodes            Number of lymph nodes positive for metastasis: 1            Diameter of largest metastasis: 8 mm       Residual Cancer Burden: 4.37       Residual Cancer Burden Class: RCB-III   [email or web address]    MARGINS  Margin Status for Invasive Carcinoma: All margins  negative for invasive  carcinoma                       Distance from closest margin: 3 mm                       Specify closest margin: Posterior   Margin Status for DCIS: Not applicable   REGIONAL LYMPH NODES  Regional Lymph Node Status: Tumor present in regional lymph node(s)                       Number of Lymph Nodes with Macrometastases (greater  than 2 mm): 1                       Number of Lymph Nodes with Micrometastases (greater  than 0.2 mm to 2 mm and/or greater than 200 cells): 0                       Number of Lymph Nodes with Isolated Tumor Cells  (0.2 mm or less OR 200 cells or less): 0                       Size of Largest Metastatic Deposit: 8 mm                       Extranodal Extension: Not identified                       Total Number of Lymph Nodes Examined (sentinel and   non-sentinel): 14                       Number of Sentinel Nodes Examined: 0  DISTANT METASTASIS  Distant Site(s) Involved, if applicable: Not applicable   PATHOLOGIC STAGE CLASSIFICATION (pTNM, AJCC 8th Edition):  TNM Descriptors: M (multifocal)  pT3  Regional Lymph Nodes Modifier: Not applicable  pN 1a  pM - Not applicable   # NOV 1610-RUE4 (by immunohistochemistry): NEGATIVE (Score 0); Foundation 1: PD-L1 negative; no targets** RAD 51  # SURVIVORSHIP:   # GENETICS: p  DIAGNOSIS: Triple negative breast cancer  STAGE: Stage IV     ;  GOALS: palliative  CURRENT/MOST RECENT THERAPY : RT    Carcinoma of overlapping sites of left breast in female, estrogen receptor negative ([place])  04/24/2020 Initial Diagnosis   Carcinoma of overlapping sites of left breast in female, estrogen receptor negative ([place])   05/04/2020 - 05/04/2020 Chemotherapy   The patient had dexamethasone (DECADRON) 4 MG tablet, 8 mg, Oral, Daily, 1 of 1 cycle, Start date: --, End date: -- palonosetron (ALOXI) injection 0.25 mg, 0.25 mg, Intravenous,  Once, 1 of 12 cycles Administration: 0.25 mg (05/04/2020) CARBOplatin (PARAPLATIN) 220 mg in sodium chloride 0.9 % 250 mL chemo infusion, 220 mg (100 % of original dose 222.4 mg), Intravenous,  Once, 1 of 12 cycles Dose modification:   (original dose 222.4 mg, Cycle 1) Administration: 220 mg (05/04/2020) PACLitaxel (TAXOL) 132 mg in sodium chloride 0.9 % 250 mL chemo infusion (</= 44m/m2), 80 mg/m2 = 132 mg, Intravenous,  Once, 1 of 12 cycles Administration: 132 mg (05/04/2020)   for chemotherapy treatment.     05/11/2020 - 07/31/2020 Chemotherapy          06/14/2020 Cancer Staging   Staging form: Breast, AJCC 8th Edition -  Clinical: Stage IIIC (cT4, cN2b, cM0, G3, ER-, PR-, HER2-) - Signed by Cammie Sickle, MD on 06/14/2020   08/20/2020 - 09/26/2020 Chemotherapy          01/25/2021 -  Chemotherapy    Patient is on Treatment Plan: BREAST METASTATIC  SACITUZUMAB GOVITECAN-HZIY ([place]) Q21D         HISTORY OF PRESENTING ILLNESS: Patient is accompanied by her mother/patient is poor historian. Tamara [name] 50 y.o.  female with schizophrenia and left locally advanced/metastatic triple negative breast cancer currently undergoing palliative radiation is here for follow-up.  Patient/mother noted to have swelling/redness of the contralateral right breast.  Mild tenderness.  Also noted to have worsening lesions of the left breast.  Patient denies any nausea vomiting headache.  Review of Systems  Unable to perform ROS: Psychiatric disorder    MEDICAL HISTORY:  Past Medical History:  Diagnosis Date   Breast cancer ([place]) 03/2020   left   Family history of breast cancer    Family history of stomach cancer    Family history of stomach cancer    GERD (gastroesophageal reflux disease)    Hypothyroidism    IUD (intrauterine device) in place IUD was placed in 2016   Leiomyoma of uterus    Schizophrenia ([place])    Thyroid disease     SURGICAL HISTORY: Past Surgical History:  Procedure Laterality Date   ADJACENT TISSUE TRANSFER/TISSUE REARRANGEMENT N/A 10/29/2020   Procedure: POSSIBLE ADJACENT TISSUE TRANSFER, TISSUE REARRANGEMENT;  Surgeon: Wallace Going, DO;  Location: [facility] ORS;  Service: Plastics;  Laterality: N/A;   APPLICATION OF A-CELL OF CHEST/ABDOMEN N/A 10/29/2020   Procedure: POSSIBLE APPLICATION OF A-CELL OF CHEST/ABDOMEN;  Surgeon: Wallace Going, DO;  Location: [facility] OR[facility];  Service: Plastics;  Laterality: N/A;   APPLICATION OF WOUND VAC N/A 10/29/2020   Procedure: POSSIBLE APPLICATION OF WOUND VAC;  Surgeon: Wallace Going, DO;  Location: [facility] ORS;  Service: Plastics;  Laterality: N/A;   BREAST BIOPSY Left 03/2020   [facility]- Triple neg- Stage 3   PORTACATH PLACEMENT Right 05/02/2020   Procedure: INSERTION PORT-A-CATH;  Surgeon: Herbert Pun, MD;  Location: [facility] ORS;  Service: General;  Laterality: Right;    TOTAL MASTECTOMY Left 10/29/2020   Procedure: TOTAL MASTECTOMY --- RADICAL;  Surgeon: Herbert Pun, MD;  Location: [facility] ORS;  Service: General;  Laterality: Left;  needs 3 hrs    SOCIAL HISTORY: Social History   Socioeconomic History   Marital status: Single    Spouse name: Not on file   Number of children: Not on file   Years of education: Not on file   Highest education level: Not on file  Occupational History   Not on file  Tobacco Use   Smoking status: Never   Smokeless tobacco: Never  Vaping Use   Vaping Use: Never used  Substance and Sexual Activity   Alcohol use: No   Drug use: No   Sexual activity: Yes    Partners: Male    Birth control/protection: Surgical, Injection  Other Topics Concern   Not on file  Social History Narrative   Not on file   Social Determinants of Health   Financial Resource Strain: Not on file  Food Insecurity: Not on file  Transportation Needs: Not on file  Physical Activity: Not on file  Stress: Not on file  Social Connections: Not on file  Intimate Partner Violence: Not on file    FAMILY HISTORY: Family History  Problem Relation Age of Onset  Breast cancer Mother    Stomach cancer Maternal Grandfather    Other Maternal Aunt        died from tumor, brain?    ALLERGIES:  is allergic to paclitaxel.  MEDICATIONS:  Current Outpatient Medications  Medication Sig Dispense Refill   benztropine (COGENTIN) 0.5 MG tablet Take 1 tablet (0.5 mg total) by mouth 2 (two) times daily. 60 tablet 1   cetirizine (ZYRTEC) 10 MG tablet Take 1 tablet (10 mg total) by mouth daily as needed for allergies.     clonazePAM (KLONOPIN) 0.5 MG tablet Take 0.5 mg by mouth at bedtime.     divalproex (DEPAKOTE) 500 MG DR tablet Take 1 tablet (500 mg total) by mouth every 12 (twelve) hours. 60 tablet 1   fluticasone (FLONASE) 50 MCG/ACT nasal spray Place 1 spray into both nostrils daily. (Patient taking differently: Place 1 spray into both nostrils  daily as needed for allergies.) 11.1 mL 9   haloperidol (HALDOL) 5 MG tablet Take 1 tablet (5 mg total) by mouth 3 (three) times daily. 90 tablet 1   haloperidol decanoate (HALDOL DECANOATE) 100 MG/ML injection Inject 100 mg into the muscle every 28 (twenty-eight) days.     HYDROcodone-acetaminophen (NORCO/VICODIN) 5-325 MG tablet Take 1 tablet by mouth every 4 (four) hours as needed for moderate pain. 20 tablet 0   KLOR-CON M20 20 MEQ tablet TAKE 1 TABLET BY MOUTH TWICE A DAY 60 tablet 0   levothyroxine (SYNTHROID) 88 MCG tablet TAKE 1 TABLET (88 MCG TOTAL) BY MOUTH DAILY AT 6 (SIX) AM. 30 tablet 1   lidocaine-prilocaine (EMLA) cream Apply 1 application topically as needed. 30 g 3   medroxyPROGESTERone (DEPO-PROVERA) 150 MG/ML injection Inject 150 mg into the muscle every 3 (three) months.     MELATONIN PO Take 1 tablet by mouth at bedtime as needed (sleep).     ondansetron (ZOFRAN) 8 MG tablet One pill every 8 hours as needed for nausea/vomitting. (Patient taking differently: Take 8 mg by mouth every 8 (eight) hours as needed for vomiting or nausea.) 40 tablet 1   prochlorperazine (COMPAZINE) 10 MG tablet Take 1 tablet (10 mg total) by mouth every 6 (six) hours as needed for nausea or vomiting. 40 tablet 1   silver sulfADIAZINE (SILVADENE) 1 % cream Apply 1 application topically daily. 50 g 1   sodium chloride 1 g tablet Take 1 tablet (1 g total) by mouth 3 (three) times daily. 90 tablet 0   hydrOXYzine (ATARAX/VISTARIL) 25 MG tablet Take 1 tablet (25 mg total) by mouth every 8 (eight) hours as needed. 90 tablet 3   No current facility-administered medications for this visit.   Facility-Administered Medications Ordered in Other Visits  Medication Dose Route Frequency Provider Last Rate Last Admin   sodium chloride flush (NS) 0.9 % injection 10 mL  10 mL Intravenous PRN Cammie Sickle, MD   10 mL at 06/14/20 0840      .  PHYSICAL EXAMINATION: ECOG PERFORMANCE STATUS: 1 - Symptomatic  but completely ambulatory  Vitals:   01/18/21 0912  BP: 133/70  Pulse: 94  Resp: 18  Temp: (!) 97.4 F (36.3 C)   Filed Weights   01/18/21 0912  Weight: 156 lb (70.8 kg)    Physical Exam Constitutional:      Comments: Accompanied by mother.  Ambulating independently.  HENT:     Head: Normocephalic and atraumatic.     Mouth/Throat:     Pharynx: No oropharyngeal exudate.  Eyes:  Pupils: Pupils are equal, round, and reactive to light.  Cardiovascular:     Rate and Rhythm: Normal rate and regular rhythm.  Pulmonary:     Effort: No respiratory distress.     Breath sounds: No wheezing.  Abdominal:     General: Bowel sounds are normal. There is no distension.     Palpations: Abdomen is soft. There is no mass.     Tenderness: There is no abdominal tenderness. There is no guarding or rebound.  Musculoskeletal:        General: No tenderness. Normal range of motion.     Cervical back: Normal range of motion and neck supple.  Skin:    General: Skin is warm.     Comments: Left mastectomy site- significant local progression noted/see images below.  Contralateral right breast-erythema noted warmth noted.  Mild tenderness noted.  No lymphadenopathy in the right axilla  Neurological:     Mental Status: She is alert and oriented to person, place, and time.  Psychiatric:        Mood and Affect: Affect normal.     Comments: Poor insight/tangential thoughts.  Unable to make a coherent conversation-underlying schizophrenia.     LABORATORY DATA:  I have reviewed the data as listed Lab Results  Component Value Date   WBC 5.9 01/18/2021   HGB 11.3 (L) 01/18/2021   HCT 31.7 (L) 01/18/2021   MCV 87.3 01/18/2021   PLT 246 01/18/2021   Recent Labs    12/07/20 1241 12/21/20 0916 01/04/21 0830 01/18/21 0839  NA 121* 124* 124* 125*  K 3.7 3.8 3.7 3.3*  CL 90* 89* 90* 89*  CO2 19* _0 GLUCOSE 107* 95 87 77  BUN <5* 6 <5* <5*  CREATININE 0.54 0.67 0.57 0.54  CALCIUM 9.0  9.5 9.0 9.1  GFRNONAA >60 >60 >60 >60  PROT 7.2  --  6.8 7.0  ALBUMIN 3.7  --  3.3* 3.4*  AST 20  --  15 14*  ALT 9  --  7 8  ALKPHOS 49  --  49 49  BILITOT 0.6  --  0.4 0.4    RADIOGRAPHIC STUDIES: I have personally reviewed the radiological images as listed and agreed with the findings in the report. No results found.     06/04/2020-     07/30/2020-    On 4/26-    09/26/2020    10/02/2020   5/25   12/04/2020   12/21/2020   On 8/14- while on RT    08/26      Contralateral right breast 8/26.     ASSESSMENT & PLAN:   Carcinoma of overlapping sites of left breast in female, estrogen receptor negative ([place]) #Locally recurrent recurrent/stage IV -left breast cancer T4 N2 M1 [subcutaneous nodules].  Currently undergoing palliative RT [08/04- 9/22].  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  #Given the progression noted in the left breast/new possible disease in the right-we will discuss with Dr. Donella Stade regarding clinically radiation versus systemic therapy.  Also discussed with Dr. Peyton Najjar regarding a punch biopsy of the right breast.  #I would consider systemic chemotherapy [sacituziumab govi] -given the progressive disease; discussion awaited.  Surgery/radiation oncology. Will need interm scan to evaluate for visceral disease.   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  Sodium today 123 stable; discussed re: free water restriction;  Potassium 3.1; continue 20 meq twice daily at home.  IV fluids today.  # schizophrenia: STABLE; Tritnity [id number]; Dr.Ahluwalia].  Itching-hydroxyzine prescribed.  # DISPOSITION:  #  IVF 1 lit today.  # 1 week- IVF 1 lit # follow up in 1 weeks- MD; labs- cbc/cmp; ca15-3; ca-27-29; cea;  IVFs 1 lit- Dr.B  # 40 minutes face-to-face with  the patient discussing the above plan of care; more than 50% of time spent on prognosis/ natural history; counseling and coordination.  All questions were answered. The patient knows to call the clinic with any problems, questions or concerns.   Cammie Sickle, MD 01/20/2021 8:42 PM

## 2021-01-19 LAB — CANCER ANTIGEN 15-3: CA 15-3: 43.8 U/mL — ABNORMAL HIGH (ref 0.0–25.0)

## 2021-01-19 LAB — CANCER ANTIGEN 27.29: CA 27.29: 63.2 U/mL — ABNORMAL HIGH (ref 0.0–38.6)

## 2021-01-19 LAB — CEA: CEA: 0.9 ng/mL (ref 0.0–4.7)

## 2021-01-20 ENCOUNTER — Encounter: Payer: Self-pay | Admitting: Internal Medicine

## 2021-01-20 NOTE — Progress Notes (Signed)
DISCONTINUE ON PATHWAY REGIMEN - Breast     Cycles 1 through 4: A cycle is every 21 days:     Pembrolizumab      Paclitaxel      Carboplatin      Filgrastim-xxxx    Cycles 5 through 8: A cycle is every 21 days:     Pembrolizumab      Doxorubicin      Cyclophosphamide      Pegfilgrastim-xxxx   **Always confirm dose/schedule in your pharmacy ordering system**  REASON: Disease Progression PRIOR TREATMENT: [id number]: Pembrolizumab 200 mg D1 + Paclitaxel 80 mg/m2 D1, 8, 15 + Carboplatin AUC=5 D1 q21 Days x 12 Weeks, Followed by Pembrolizumab 200 mg + Doxorubicin + Cyclophosphamide q21 Days x 12 Weeks, Followed by Surgery TREATMENT RESPONSE: Progressive Disease (PD)  START ON PATHWAY REGIMEN - Breast     A cycle is every 21 days:     Sacituzumab govitecan-hziy   **Always confirm dose/schedule in your pharmacy ordering system**  Patient Characteristics: Distant Metastases or Locoregional Recurrent Disease - Unresected or Locally Advanced Unresectable Disease Progressing after Neoadjuvant and Local Therapies, HER2 Negative/Unknown/Equivocal, ER Negative/Unknown, Chemotherapy, Third Line and Beyond, Prior  or Contraindicated Anthracycline and Prior or Contraindicated Eribulin Therapeutic Status: Distant Metastases ER Status: Negative (-) HER2 Status: Negative (-) PR Status: Negative (-) Therapy Approach Indicated: Standard Chemotherapy/Endocrine Therapy Line of Therapy: Third Line and Beyond Intent of Therapy: Non-Curative / Palliative Intent, Discussed with Patient

## 2021-01-21 ENCOUNTER — Ambulatory Visit
Admission: RE | Admit: 2021-01-21 | Discharge: 2021-01-21 | Disposition: A | Discharge status: Home / Self Care | Payer: Medicaid Other | Source: Ambulatory Visit | Attending: Radiation Oncology | Admitting: Radiation Oncology

## 2021-01-21 ENCOUNTER — Other Ambulatory Visit: Payer: Self-pay | Admitting: Internal Medicine

## 2021-01-21 DIAGNOSIS — Z510 Encounter for antineoplastic radiation therapy: Secondary | ICD-10-CM | POA: Diagnosis not present

## 2021-01-21 NOTE — Telephone Encounter (Signed)
  Component Ref Range & Units 3 d ago  (01/18/21) 2 wk ago  (01/04/21) 1 mo ago  (12/21/20) 1 mo ago  (12/07/20) 2 mo ago  (10/30/20) 2 mo ago  (10/29/20) 3 mo ago  (10/16/20)  Potassium 3.5 - 5.1 mmol/L 3.3 Low   3.7  3.8  3.7  3.6  4.0  3.3 Low

## 2021-01-22 ENCOUNTER — Other Ambulatory Visit: Payer: Self-pay | Admitting: *Deleted

## 2021-01-22 ENCOUNTER — Ambulatory Visit
Admission: RE | Admit: 2021-01-22 | Discharge: 2021-01-22 | Disposition: A | Discharge status: Home / Self Care | Payer: Medicaid Other | Source: Ambulatory Visit | Attending: Radiation Oncology | Admitting: Radiation Oncology

## 2021-01-22 DIAGNOSIS — Z510 Encounter for antineoplastic radiation therapy: Secondary | ICD-10-CM | POA: Diagnosis not present

## 2021-01-22 DIAGNOSIS — C50812 Malignant neoplasm of overlapping sites of left female breast: Secondary | ICD-10-CM

## 2021-01-23 ENCOUNTER — Ambulatory Visit
Admission: RE | Admit: 2021-01-23 | Discharge: 2021-01-23 | Disposition: A | Discharge status: Home / Self Care | Payer: Medicaid Other | Source: Ambulatory Visit | Attending: Radiation Oncology | Admitting: Radiation Oncology

## 2021-01-23 DIAGNOSIS — Z510 Encounter for antineoplastic radiation therapy: Secondary | ICD-10-CM | POA: Diagnosis not present

## 2021-01-24 ENCOUNTER — Ambulatory Visit: Payer: Medicaid Other

## 2021-01-24 ENCOUNTER — Ambulatory Visit
Admission: RE | Admit: 2021-01-24 | Discharge: 2021-01-24 | Disposition: A | Discharge status: Home / Self Care | Payer: Medicaid Other | Source: Ambulatory Visit | Attending: Radiation Oncology | Admitting: Radiation Oncology

## 2021-01-24 DIAGNOSIS — Z5111 Encounter for antineoplastic chemotherapy: Secondary | ICD-10-CM | POA: Diagnosis not present

## 2021-01-24 DIAGNOSIS — E876 Hypokalemia: Secondary | ICD-10-CM | POA: Diagnosis not present

## 2021-01-24 DIAGNOSIS — Z923 Personal history of irradiation: Secondary | ICD-10-CM | POA: Diagnosis not present

## 2021-01-24 DIAGNOSIS — Z9012 Acquired absence of left breast and nipple: Secondary | ICD-10-CM | POA: Diagnosis not present

## 2021-01-24 DIAGNOSIS — C50812 Malignant neoplasm of overlapping sites of left female breast: Secondary | ICD-10-CM | POA: Diagnosis not present

## 2021-01-24 DIAGNOSIS — K219 Gastro-esophageal reflux disease without esophagitis: Secondary | ICD-10-CM | POA: Diagnosis not present

## 2021-01-24 DIAGNOSIS — Z800 Family history of malignant neoplasm of digestive organs: Secondary | ICD-10-CM | POA: Diagnosis not present

## 2021-01-24 DIAGNOSIS — Z5189 Encounter for other specified aftercare: Secondary | ICD-10-CM | POA: Diagnosis not present

## 2021-01-24 DIAGNOSIS — R197 Diarrhea, unspecified: Secondary | ICD-10-CM | POA: Diagnosis not present

## 2021-01-24 DIAGNOSIS — Z79899 Other long term (current) drug therapy: Secondary | ICD-10-CM | POA: Diagnosis not present

## 2021-01-24 DIAGNOSIS — Z510 Encounter for antineoplastic radiation therapy: Secondary | ICD-10-CM | POA: Insufficient documentation

## 2021-01-24 DIAGNOSIS — Z171 Estrogen receptor negative status [ER-]: Secondary | ICD-10-CM | POA: Insufficient documentation

## 2021-01-24 DIAGNOSIS — E039 Hypothyroidism, unspecified: Secondary | ICD-10-CM | POA: Diagnosis not present

## 2021-01-24 DIAGNOSIS — F209 Schizophrenia, unspecified: Secondary | ICD-10-CM | POA: Diagnosis not present

## 2021-01-24 DIAGNOSIS — Z803 Family history of malignant neoplasm of breast: Secondary | ICD-10-CM | POA: Diagnosis not present

## 2021-01-25 ENCOUNTER — Other Ambulatory Visit: Payer: Self-pay

## 2021-01-25 ENCOUNTER — Inpatient Hospital Stay: Payer: Medicaid Other | Attending: Internal Medicine

## 2021-01-25 ENCOUNTER — Ambulatory Visit
Admission: RE | Admit: 2021-01-25 | Discharge: 2021-01-25 | Disposition: A | Discharge status: Home / Self Care | Payer: Medicaid Other | Source: Ambulatory Visit | Attending: Radiation Oncology | Admitting: Radiation Oncology

## 2021-01-25 ENCOUNTER — Inpatient Hospital Stay: Payer: Medicaid Other

## 2021-01-25 ENCOUNTER — Encounter: Payer: Self-pay | Admitting: Internal Medicine

## 2021-01-25 ENCOUNTER — Inpatient Hospital Stay (HOSPITAL_BASED_OUTPATIENT_CLINIC_OR_DEPARTMENT_OTHER): Payer: Medicaid Other | Admitting: Internal Medicine

## 2021-01-25 DIAGNOSIS — Z800 Family history of malignant neoplasm of digestive organs: Secondary | ICD-10-CM | POA: Insufficient documentation

## 2021-01-25 DIAGNOSIS — Z171 Estrogen receptor negative status [ER-]: Secondary | ICD-10-CM | POA: Diagnosis not present

## 2021-01-25 DIAGNOSIS — E876 Hypokalemia: Secondary | ICD-10-CM | POA: Insufficient documentation

## 2021-01-25 DIAGNOSIS — Z9012 Acquired absence of left breast and nipple: Secondary | ICD-10-CM | POA: Insufficient documentation

## 2021-01-25 DIAGNOSIS — Z803 Family history of malignant neoplasm of breast: Secondary | ICD-10-CM | POA: Insufficient documentation

## 2021-01-25 DIAGNOSIS — R197 Diarrhea, unspecified: Secondary | ICD-10-CM | POA: Insufficient documentation

## 2021-01-25 DIAGNOSIS — Z5111 Encounter for antineoplastic chemotherapy: Secondary | ICD-10-CM | POA: Diagnosis not present

## 2021-01-25 DIAGNOSIS — E039 Hypothyroidism, unspecified: Secondary | ICD-10-CM | POA: Insufficient documentation

## 2021-01-25 DIAGNOSIS — C50812 Malignant neoplasm of overlapping sites of left female breast: Secondary | ICD-10-CM | POA: Diagnosis not present

## 2021-01-25 DIAGNOSIS — Z923 Personal history of irradiation: Secondary | ICD-10-CM | POA: Insufficient documentation

## 2021-01-25 DIAGNOSIS — Z5189 Encounter for other specified aftercare: Secondary | ICD-10-CM | POA: Insufficient documentation

## 2021-01-25 DIAGNOSIS — F209 Schizophrenia, unspecified: Secondary | ICD-10-CM | POA: Insufficient documentation

## 2021-01-25 DIAGNOSIS — K219 Gastro-esophageal reflux disease without esophagitis: Secondary | ICD-10-CM | POA: Insufficient documentation

## 2021-01-25 DIAGNOSIS — Z510 Encounter for antineoplastic radiation therapy: Secondary | ICD-10-CM | POA: Insufficient documentation

## 2021-01-25 DIAGNOSIS — Z79899 Other long term (current) drug therapy: Secondary | ICD-10-CM | POA: Insufficient documentation

## 2021-01-25 LAB — COMPREHENSIVE METABOLIC PANEL
ALT: 6 U/L (ref 0–44)
AST: 12 U/L — ABNORMAL LOW (ref 15–41)
Albumin: 3.3 g/dL — ABNORMAL LOW (ref 3.5–5.0)
Alkaline Phosphatase: 49 U/L (ref 38–126)
Anion gap: 10 (ref 5–15)
BUN: <5 mg/dL — ABNORMAL LOW (ref 6–20)
CO2: 25 mmol/L (ref 22–32)
Calcium: 9 mg/dL (ref 8.9–10.3)
Chloride: 88 mmol/L — ABNORMAL LOW (ref 98–111)
Creatinine, Ser: 0.6 mg/dL (ref 0.44–1.00)
GFR, Estimated: >60 mL/min (ref >60)
Glucose, Bld: 96 mg/dL (ref 70–99)
Potassium: 3.5 mmol/L (ref 3.5–5.1)
Sodium: 123 mmol/L — ABNORMAL LOW (ref 135–145)
Total Bilirubin: 0.5 mg/dL (ref 0.3–1.2)
Total Protein: 6.8 g/dL (ref 6.5–8.1)

## 2021-01-25 LAB — CBC WITH DIFFERENTIAL/PLATELET
Abs Immature Granulocytes: 0.05 10*3/uL (ref 0.00–0.07)
Basophils Absolute: 0 10*3/uL (ref 0.0–0.1)
Basophils Relative: 0 %
Eosinophils Absolute: 0 10*3/uL (ref 0.0–0.5)
Eosinophils Relative: 0 %
HCT: 29.6 % — ABNORMAL LOW (ref 36.0–46.0)
Hemoglobin: 10.3 g/dL — ABNORMAL LOW (ref 12.0–15.0)
Immature Granulocytes: 1 %
Lymphocytes Relative: 5 %
Lymphs Abs: 0.3 10*3/uL — ABNORMAL LOW (ref 0.7–4.0)
MCH: 30.5 pg (ref 26.0–34.0)
MCHC: 34.8 g/dL (ref 30.0–36.0)
MCV: 87.6 fL (ref 80.0–100.0)
Monocytes Absolute: 1.5 10*3/uL — ABNORMAL HIGH (ref 0.1–1.0)
Monocytes Relative: 25 %
Neutro Abs: 4.1 10*3/uL (ref 1.7–7.7)
Neutrophils Relative %: 69 %
Platelets: 190 10*3/uL (ref 150–400)
RBC: 3.38 MIL/uL — ABNORMAL LOW (ref 3.87–5.11)
RDW: 13.7 % (ref 11.5–15.5)
WBC: 6 10*3/uL (ref 4.0–10.5)
nRBC: 0 % (ref 0.0–0.2)

## 2021-01-25 MED ORDER — SODIUM CHLORIDE 0.9 % IV SOLN
Freq: Once | INTRAVENOUS | Status: AC
  Filled 2021-01-25: qty 250

## 2021-01-25 MED ORDER — HEPARIN SOD (PORK) LOCK FLUSH 100 UNIT/ML IV SOLN
500.0000 [IU] | Freq: Once | INTRAVENOUS | Status: AC | PRN
  Administered 2021-01-25: 500 [IU]
  Filled 2021-01-25: qty 5

## 2021-01-25 NOTE — Patient Instructions (Signed)
Hyponatremia Hyponatremia is when the amount of salt (sodium) in your blood is too low. When salt levels are low, your body may take in extra water. This can cause swelling throughout the body. The swelling oftenaffects the brain. What are the causes? This condition may be caused by: Certain medical problems or conditions. Vomiting a lot. Having watery poop (diarrhea) often. Certain medicines or illegal drugs. Not having enough water in the body (dehydration). Drinking too much water. Eating a diet that is low in salt. Large burns on your body. Too much sweating. What increases the risk? You are more likely to get this condition if you: Have long-term (chronic) kidney disease. Have heart failure. Have a medical condition that causes you to have watery poop often. Do very hard exercises. Take medicines that affect the amount of salt is in your blood. What are the signs or symptoms? Symptoms of this condition include: Headache. Feeling like you may vomit (nausea). Vomiting. Being very tired (lethargic). Muscle weakness and cramps. Not wanting to eat as much as normal (loss of appetite). Feeling weak or light-headed. Severe symptoms of this condition include: Confusion. Feeling restless (agitation). Having a fast heart rate. Passing out (fainting). Seizures. Coma. How is this treated? Treatment for this condition depends on the cause. Treatment may include: Getting fluids through an IV tube that is put into one of your veins. Taking medicines to fix the salt levels in your blood. If medicines are causing the problem, your medicines will need to be changed. Limiting how much water or fluid you take in. Monitoring in the hospital to watch your symptoms. Follow these instructions at home:  Take over-the-counter and prescription medicines only as told by your doctor. Many medicines can make this condition worse. Talk with your doctor about any medicines that you are taking. Eat  and drink exactly as you are told by your doctor. Eat only the foods you are told to eat. Limit how much fluid you take. Do not drink alcohol. Keep all follow-up visits as told by your doctor. This is important. Contact a doctor if: You feel more like you may vomit. You feel more tired. Your headache gets worse. You feel more confused. You feel weaker. Your symptoms go away and then they come back. You have trouble following the diet instructions. Get help right away if: You have a seizure. You pass out. You keep having watery poop. You keep vomiting. Summary Hyponatremia is when the amount of salt in your blood is too low. When salt levels are low, you can have swelling throughout the body. The swelling mostly affects the brain. Treatment depends on the cause. Treatment may include getting IV fluids, medicines, or not drinking as much fluid. This information is not intended to replace advice given to you by your health care provider. Make sure you discuss any questions you have with your healthcare provider. Document Revised: 07/29/2018 Document Reviewed: 04/15/2018 Elsevier Patient Education  2022 [employer]  

## 2021-01-25 NOTE — Progress Notes (Signed)
[name] CONSULT NOTE  Patient Care Team: Physicians, [place] as PCP - General Vladimir Crofts, MD as Consulting Physician (Neurology) Cammie Sickle, MD as Consulting Physician (Internal Medicine) Randel Pigg, MD as Consulting Physician (Psychiatry) Herbert Pun, MD as Consulting Physician (General Surgery)  CHIEF COMPLAINTS/PURPOSE OF CONSULTATION: Breast cancer  #  Oncology History Overview Note  # NOV 2021- LEFT BREAST CANCER - [place]; TRIPLE NEGATIVE; Grade of invasive carcinoma: Grade 3; # LYMPH NODE, LEFT AXILLA; [Dr.Cintron]; ULTRASOUND-GUIDED CORE BIOPSY:  - METASTATIC MAMMARY CARCINOMA, AT LEAST 9 MM METASTASIS. ]; DEC 9th PET scan-Intensely hypermetabolic 09.2 cm left breast mass compatible with primary left breast malignancy, with left pectoralis muscle involvement posteriorly and cutaneous involvement by the mass. Multiple smaller hypermetabolic satellite malignant nodules scattered throughout the left breast. Bulky hypermetabolic left axillary and left retropectoral nodal metastases.   # 12/10- Carboplatin ONLY;  Taxol hypersensitivity reaction-discontinued  # 12/17- AC #1 q 3 W x4; MARCH 28th- CARBO-ABRAXANE-KEYTRUDA; #cycle 1-8 [abraxane dose reduced to 75/m2- sec to diarrhea-3]  DIAGNOSIS:  A. LEFT BREAST; RADICAL MASTECTOMY:  - INVASIVE MAMMARY CARCINOMA.  - METASTATIC CARCINOMA INVOLVING ONE OF TWELVE LYMPH NODES (1/12).  - SEE CANCER SUMMARY BELOW.   B. AXILLARY TISSUE, LEFT; RESECTION:  - TWO LYMPH NODES, NEGATIVE FOR METASTATIC CARCINOMA (0/2).   CANCER CASE SUMMARY: INVASIVE CARCINOMA OF THE BREAST  Standard(s): AJCC-UICC 8   SPECIMEN  Procedure: Radical mastectomy  Specimen Laterality: Left   TUMOR  Histologic Type: Invasive mammary carcinoma of no special type  Histologic Grade (Nottingham Histologic Score)                       Glandular (Acinar)/Tubular Differentiation: 3                       Nuclear  Pleomorphism: 3                       Mitotic Rate: 3                       Overall Grade: 3  Tumor Size: 82 x 77 x 68 mm, separate 2 mm focus within randomly sampled  upper inner quadrant  Ductal Carcinoma In Situ (DCIS): Not identified  Lymphovascular Invasion: Present  Treatment Effect in the Breast: Treatment Effect in the Breast: No  definite response to presurgical therapy in the invasive carcinoma  Treatment Effect in the Lymph Nodes: Probable or definite response to  presurgical therapy in metastatic carcinoma  Residual Cancer Burden (RCB) from [facility] calculator (website  below):       Primary Tumor Bed            Primary tumor bed: 82 mm x 77 mm            Overall cancer cellularity: 100%            Percentage of cancer that is in situ: 0%       Lymph nodes            Number of lymph nodes positive for metastasis: 1            Diameter of largest metastasis: 8 mm       Residual Cancer Burden: 4.37       Residual Cancer Burden Class: RCB-III   [email or web address]    MARGINS  Margin Status for Invasive Carcinoma: All margins  negative for invasive  carcinoma                       Distance from closest margin: 3 mm                       Specify closest margin: Posterior   Margin Status for DCIS: Not applicable   REGIONAL LYMPH NODES  Regional Lymph Node Status: Tumor present in regional lymph node(s)                       Number of Lymph Nodes with Macrometastases (greater  than 2 mm): 1                       Number of Lymph Nodes with Micrometastases (greater  than 0.2 mm to 2 mm and/or greater than 200 cells): 0                       Number of Lymph Nodes with Isolated Tumor Cells  (0.2 mm or less OR 200 cells or less): 0                       Size of Largest Metastatic Deposit: 8 mm                       Extranodal Extension: Not identified                       Total Number of Lymph Nodes Examined (sentinel and   non-sentinel): 14                       Number of Sentinel Nodes Examined: 0  DISTANT METASTASIS  Distant Site(s) Involved, if applicable: Not applicable   PATHOLOGIC STAGE CLASSIFICATION (pTNM, AJCC 8th Edition):  TNM Descriptors: M (multifocal)  pT3  Regional Lymph Nodes Modifier: Not applicable  pN 1a  pM - Not applicable   # NOV 1610-RUE4 (by immunohistochemistry): NEGATIVE (Score 0); Foundation 1: PD-L1 negative; no targets** RAD 51  # SURVIVORSHIP:   # GENETICS: p  DIAGNOSIS: Triple negative breast cancer  STAGE: Stage IV     ;  GOALS: palliative  CURRENT/MOST RECENT THERAPY : RT    Carcinoma of overlapping sites of left breast in female, estrogen receptor negative ([place])  04/24/2020 Initial Diagnosis   Carcinoma of overlapping sites of left breast in female, estrogen receptor negative ([place])   05/04/2020 - 05/04/2020 Chemotherapy   The patient had dexamethasone (DECADRON) 4 MG tablet, 8 mg, Oral, Daily, 1 of 1 cycle, Start date: --, End date: -- palonosetron (ALOXI) injection 0.25 mg, 0.25 mg, Intravenous,  Once, 1 of 12 cycles Administration: 0.25 mg (05/04/2020) CARBOplatin (PARAPLATIN) 220 mg in sodium chloride 0.9 % 250 mL chemo infusion, 220 mg (100 % of original dose 222.4 mg), Intravenous,  Once, 1 of 12 cycles Dose modification:   (original dose 222.4 mg, Cycle 1) Administration: 220 mg (05/04/2020) PACLitaxel (TAXOL) 132 mg in sodium chloride 0.9 % 250 mL chemo infusion (</= 44m/m2), 80 mg/m2 = 132 mg, Intravenous,  Once, 1 of 12 cycles Administration: 132 mg (05/04/2020)   for chemotherapy treatment.     05/11/2020 - 07/31/2020 Chemotherapy          06/14/2020 Cancer Staging   Staging form: Breast, AJCC 8th Edition -  Clinical: Stage IIIC (cT4, cN2b, cM0, G3, ER-, PR-, HER2-) - Signed by Cammie Sickle, MD on 06/14/2020   08/20/2020 - 09/26/2020 Chemotherapy          01/25/2021 -  Chemotherapy    Patient is on Treatment Plan: BREAST METASTATIC  SACITUZUMAB GOVITECAN-HZIY ([place]) Q21D         HISTORY OF PRESENTING ILLNESS: Patient is accompanied by her mother/patient is poor historian. Tamara [name] 50 y.o.  female with schizophrenia and left locally advanced/metastatic triple negative breast cancer currently undergoing palliative radiation is here for follow-up.  Patient continues to notice swelling and redness of the contralateral right breast.  Possible mild tenderness.  Also lesions on the left mastectomy site-bruising.  Patient denies any nausea vomiting headache.   Review of Systems  Unable to perform ROS: Psychiatric disorder    MEDICAL HISTORY:  Past Medical History:  Diagnosis Date  . Breast cancer ([place]) 03/2020   left  . Family history of breast cancer   . Family history of stomach cancer   . Family history of stomach cancer   . GERD (gastroesophageal reflux disease)   . Hypothyroidism   . IUD (intrauterine device) in place IUD was placed in 2016  . Leiomyoma of uterus   . Schizophrenia ([place])   . Thyroid disease     SURGICAL HISTORY: Past Surgical History:  Procedure Laterality Date  . ADJACENT TISSUE TRANSFER/TISSUE REARRANGEMENT N/A 10/29/2020   Procedure: POSSIBLE ADJACENT TISSUE TRANSFER, TISSUE REARRANGEMENT;  Surgeon: Wallace Going, DO;  Location: [facility] ORS;  Service: Plastics;  Laterality: N/A;  . APPLICATION OF A-CELL OF CHEST/ABDOMEN N/A 10/29/2020   Procedure: POSSIBLE APPLICATION OF A-CELL OF CHEST/ABDOMEN;  Surgeon: Wallace Going, DO;  Location: [facility] OR[facility];  Service: Plastics;  Laterality: N/A;  . APPLICATION OF WOUND VAC N/A 10/29/2020   Procedure: POSSIBLE APPLICATION OF WOUND VAC;  Surgeon: Wallace Going, DO;  Location: [facility] OR[facility];  Service: Plastics;  Laterality: N/A;  . BREAST BIOPSY Left 03/2020   [facility]- Triple neg- Stage 3  . PORTACATH PLACEMENT Right 05/02/2020   Procedure: INSERTION PORT-A-CATH;  Surgeon: Herbert Pun, MD;  Location: [facility] OR[facility];  Service:  General;  Laterality: Right;  . TOTAL MASTECTOMY Left 10/29/2020   Procedure: TOTAL MASTECTOMY --- RADICAL;  Surgeon: Herbert Pun, MD;  Location: [facility] OR[facility];  Service: General;  Laterality: Left;  needs 3 hrs    SOCIAL HISTORY: Social History   Socioeconomic History  . Marital status: Single    Spouse name: Not on file  . Number of children: Not on file  . Years of education: Not on file  . Highest education level: Not on file  Occupational History  . Not on file  Tobacco Use  . Smoking status: Never  . Smokeless tobacco: Never  Vaping Use  . Vaping Use: Never used  Substance and Sexual Activity  . Alcohol use: No  . Drug use: No  . Sexual activity: Yes    Partners: Male    Birth control/protection: Surgical, Injection  Other Topics Concern  . Not on file  Social History Narrative  . Not on file   Social Determinants of Health   Financial Resource Strain: Not on file  Food Insecurity: Not on file  Transportation Needs: Not on file  Physical Activity: Not on file  Stress: Not on file  Social Connections: Not on file  Intimate Partner Violence: Not on file    FAMILY HISTORY: Family History  Problem Relation Age of  Onset  . Breast cancer Mother   . Stomach cancer Maternal Grandfather   . Other Maternal Aunt        died from tumor, brain?    ALLERGIES:  is allergic to paclitaxel.  MEDICATIONS:  Current Outpatient Medications  Medication Sig Dispense Refill  . benztropine (COGENTIN) 0.5 MG tablet Take 1 tablet (0.5 mg total) by mouth 2 (two) times daily. 60 tablet 1  . cetirizine (ZYRTEC) 10 MG tablet Take 1 tablet (10 mg total) by mouth daily as needed for allergies.    . clonazePAM (KLONOPIN) 0.5 MG tablet Take 0.5 mg by mouth at bedtime.    . divalproex (DEPAKOTE) 500 MG DR tablet Take 1 tablet (500 mg total) by mouth every 12 (twelve) hours. 60 tablet 1  . fluticasone (FLONASE) 50 MCG/ACT nasal spray Place 1 spray into both nostrils daily. (Patient  taking differently: Place 1 spray into both nostrils daily as needed for allergies.) 11.1 mL 9  . haloperidol (HALDOL) 5 MG tablet Take 1 tablet (5 mg total) by mouth 3 (three) times daily. 90 tablet 1  . haloperidol decanoate (HALDOL DECANOATE) 100 MG/ML injection Inject 100 mg into the muscle every 28 (twenty-eight) days.    Marland Kitchen HYDROcodone-acetaminophen (NORCO/VICODIN) 5-325 MG tablet Take 1 tablet by mouth every 4 (four) hours as needed for moderate pain. 20 tablet 0  . hydrOXYzine (ATARAX/VISTARIL) 25 MG tablet Take 1 tablet (25 mg total) by mouth every 8 (eight) hours as needed. 90 tablet 3  . KLOR-CON M20 20 MEQ tablet TAKE 1 TABLET BY MOUTH TWICE A DAY 60 tablet 0  . levothyroxine (SYNTHROID) 88 MCG tablet TAKE 1 TABLET (88 MCG TOTAL) BY MOUTH DAILY AT 6 (SIX) AM. 30 tablet 1  . lidocaine-prilocaine (EMLA) cream Apply 1 application topically as needed. 30 g 3  . medroxyPROGESTERone (DEPO-PROVERA) 150 MG/ML injection Inject 150 mg into the muscle every 3 (three) months.    Marland Kitchen MELATONIN PO Take 1 tablet by mouth at bedtime as needed (sleep).    . ondansetron (ZOFRAN) 8 MG tablet One pill every 8 hours as needed for nausea/vomitting. (Patient taking differently: Take 8 mg by mouth every 8 (eight) hours as needed for vomiting or nausea.) 40 tablet 1  . prochlorperazine (COMPAZINE) 10 MG tablet Take 1 tablet (10 mg total) by mouth every 6 (six) hours as needed for nausea or vomiting. 40 tablet 1  . silver sulfADIAZINE (SILVADENE) 1 % cream Apply 1 application topically daily. 50 g 1  . sodium chloride 1 g tablet Take 1 tablet (1 g total) by mouth 3 (three) times daily. 90 tablet 0   No current facility-administered medications for this visit.   Facility-Administered Medications Ordered in Other Visits  Medication Dose Route Frequency Provider Last Rate Last Admin  . sodium chloride flush (NS) 0.9 % injection 10 mL  10 mL Intravenous PRN Cammie Sickle, MD   10 mL at 06/14/20 0840       .  PHYSICAL EXAMINATION: ECOG PERFORMANCE STATUS: 1 - Symptomatic but completely ambulatory  Vitals:   01/25/21 0901  BP: 124/86  Pulse: 89  Resp: 16  Temp: (!) 97.1 F (36.2 C)  SpO2: 100%   Filed Weights   01/25/21 0901  Weight: 155 lb (70.3 kg)    Physical Exam Constitutional:      Comments: Accompanied by mother.  Ambulating independently.  HENT:     Head: Normocephalic and atraumatic.     Mouth/Throat:     Pharynx:  No oropharyngeal exudate.  Eyes:     Pupils: Pupils are equal, round, and reactive to light.  Cardiovascular:     Rate and Rhythm: Normal rate and regular rhythm.  Pulmonary:     Effort: No respiratory distress.     Breath sounds: No wheezing.  Abdominal:     General: Bowel sounds are normal. There is no distension.     Palpations: Abdomen is soft. There is no mass.     Tenderness: no abdominal tenderness There is no guarding or rebound.  Musculoskeletal:        General: No tenderness. Normal range of motion.     Cervical back: Normal range of motion and neck supple.  Skin:    General: Skin is warm.     Comments: Left mastectomy site- significant local progression noted/see images below.  Contralateral right breast-erythema noted warmth noted.  Mild tenderness noted.  No lymphadenopathy in the right axilla  Neurological:     Mental Status: She is alert and oriented to person, place, and time.  Psychiatric:        Mood and Affect: Affect normal.     Comments: Poor insight/tangential thoughts.  Unable to make a coherent conversation-underlying schizophrenia.     LABORATORY DATA:  I have reviewed the data as listed Lab Results  Component Value Date   WBC 6.0 01/25/2021   HGB 10.3 (L) 01/25/2021   HCT 29.6 (L) 01/25/2021   MCV 87.6 01/25/2021   PLT 190 01/25/2021   Recent Labs    01/04/21 0830 01/18/21 0839 01/25/21 0849  NA 124* 125* 123*  K 3.7 3.3* 3.5  CL 90* 89* 88*  CO2 $Re'25 27 25  'rkU$ GLUCOSE 87 77 96  BUN <5* <5* <5*   CREATININE 0.57 0.54 0.60  CALCIUM 9.0 9.1 9.0  GFRNONAA >60 >60 >60  PROT 6.8 7.0 6.8  ALBUMIN 3.3* 3.4* 3.3*  AST 15 14* 12*  ALT $Re'7 8 6  'HNO$ ALKPHOS 49 49 49  BILITOT 0.4 0.4 0.5    RADIOGRAPHIC STUDIES: I have personally reviewed the radiological images as listed and agreed with the findings in the report. No results found.     06/04/2020-     07/30/2020-    On 4/26-    09/26/2020    10/02/2020   5/25   12/04/2020   12/21/2020   On 8/14- while on RT    08/26      Contralateral right breast 8/26.     ASSESSMENT & PLAN:   Carcinoma of overlapping sites of left breast in female, estrogen receptor negative ([place]) #Locally recurrent recurrent/stage IV -left breast cancer T4 N2 M1 [subcutaneous nodules].  Currently undergoing palliative RT [08/04- 9/08].  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  #Given the progression noted in the left breast/new possible disease in the right- discussed with Dr. Donella Stade regarding clinically radiation versus systemic therapy. Plan start chemo- on 0/09.   Also discussed with Dr. Peyton Najjar regarding a punch biopsy of the right breast.  Discussed with the patient's mother.  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  Sodium today 123 stable; discussed re: free water restriction;  Potassium 3.1; continue 20 meq twice daily at home.  IV fluids today.  # schizophrenia: STABLE; Tritnity [phone]; Dr.Ahluwalia].  Itching-hydroxyzine prescribed.  #Prognosis : Unfortunately very poor-mother understands patient disease is incurable.  As per the mother patient very upset-at last visit with regards to  conversation regarding progressive disease/incurable disease. Given progressive disease-recommend palliative care evaluation/ next week regarding CODE STATUS.    # DISPOSITION:  #  IVF 1 lit today.  # No Chemo today # follow up in 1 weeks- MD; labs- cbc/cmp;Trodelvy- IVFs 1 lit- Dr.B  Addendum: Left a voicemail for the patient's sister [name] regarding the above plan.   All questions were answered. The patient knows to call the clinic with any problems, questions or concerns.   Cammie Sickle, MD 01/25/2021 4:04 PM

## 2021-01-25 NOTE — Assessment & Plan Note (Addendum)
#  Locally recurrent recurrent/stage IV -left breast cancer T4 N2 M1 [subcutaneous nodules].  Currently undergoing palliative RT [08/04- 9/08].  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  #Given the progression noted in the left breast/new possible disease in the right- discussed with Dr. Donella Stade regarding clinically radiation versus systemic therapy. Plan start chemo- on 0/09.   Also discussed with Dr. Peyton Najjar regarding a punch biopsy of the right breast.  Discussed with the patient's mother.  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  Sodium today 123 stable; discussed re: free water restriction;  Potassium 3.1; continue 20 meq twice daily at home.  IV fluids today.  # schizophrenia: STABLE; Tritnity [phone]; Dr.Ahluwalia].  Itching-hydroxyzine prescribed.  #Prognosis : Unfortunately very poor-mother understands patient disease is incurable.  As per the mother patient very upset-at last visit with regards to conversation regarding progressive disease/incurable disease. Given progressive disease-recommend palliative care evaluation/ next week regarding CODE STATUS.   # DISPOSITION:  #  IVF 1 lit today.  # No Chemo today # follow up in 1 weeks- MD; labs- cbc/cmp;Trodelvy- IVFs 1 lit- Dr.B  Addendum: Left a voicemail for the patient's sister [name] regarding the above plan.

## 2021-01-26 LAB — CANCER ANTIGEN 27.29: CA 27.29: 56.7 U/mL — ABNORMAL HIGH (ref 0.0–38.6)

## 2021-01-26 LAB — CEA: CEA: 0.8 ng/mL (ref 0.0–4.7)

## 2021-01-26 LAB — CANCER ANTIGEN 15-3: CA 15-3: 38 U/mL — ABNORMAL HIGH (ref 0.0–25.0)

## 2021-01-29 ENCOUNTER — Encounter: Payer: Self-pay | Admitting: Internal Medicine

## 2021-01-29 ENCOUNTER — Ambulatory Visit
Admission: RE | Admit: 2021-01-29 | Discharge: 2021-01-29 | Disposition: A | Discharge status: Home / Self Care | Payer: Medicaid Other | Source: Ambulatory Visit | Attending: Radiation Oncology | Admitting: Radiation Oncology

## 2021-01-29 DIAGNOSIS — Z5111 Encounter for antineoplastic chemotherapy: Secondary | ICD-10-CM | POA: Diagnosis not present

## 2021-01-29 NOTE — Progress Notes (Signed)
On September 6 I spoke to Tamara [name] regarding patient's right breast abnormality await punch biopsy.

## 2021-01-30 ENCOUNTER — Ambulatory Visit
Admission: RE | Admit: 2021-01-30 | Discharge: 2021-01-30 | Disposition: A | Discharge status: Home / Self Care | Payer: Medicaid Other | Source: Ambulatory Visit | Attending: Radiation Oncology | Admitting: Radiation Oncology

## 2021-01-30 ENCOUNTER — Inpatient Hospital Stay (HOSPITAL_BASED_OUTPATIENT_CLINIC_OR_DEPARTMENT_OTHER): Payer: Medicaid Other | Admitting: Hospice and Palliative Medicine

## 2021-01-30 VITALS — BP 107/72 | HR 100 | Temp 98.5°F | Resp 18

## 2021-01-30 DIAGNOSIS — G893 Neoplasm related pain (acute) (chronic): Secondary | ICD-10-CM

## 2021-01-30 DIAGNOSIS — Z171 Estrogen receptor negative status [ER-]: Secondary | ICD-10-CM

## 2021-01-30 DIAGNOSIS — Z515 Encounter for palliative care: Secondary | ICD-10-CM

## 2021-01-30 DIAGNOSIS — Z5111 Encounter for antineoplastic chemotherapy: Secondary | ICD-10-CM | POA: Diagnosis not present

## 2021-01-30 DIAGNOSIS — C50812 Malignant neoplasm of overlapping sites of left female breast: Secondary | ICD-10-CM | POA: Diagnosis not present

## 2021-01-30 NOTE — Progress Notes (Signed)
[place] [facility]  [phone] [phone]   Name: Tamara [name] Date: 01/30/2021 MRN: [id number]  DOB: 01/11/1971  Patient Care Team: Physicians, [place] as PCP - General Vladimir Crofts, MD as Consulting Physician (Neurology) Cammie Sickle, MD as Consulting Physician (Internal Medicine) Randel Pigg, MD as Consulting Physician (Psychiatry) Herbert Pun, MD as Consulting Physician (General Surgery)    REASON FOR CONSULTATION: Tamara [name] is a 50 y.o. female with multiple medical problems including including schizophrenia, and intellectual disability, with recurrent stage IV left breast cancer status post palliative RT and systemic chemo.  Palliative care was consulted to address goals.  SOCIAL HISTORY:     reports that she has never smoked. She has never used smokeless tobacco. She reports that she does not drink alcohol and does not use drugs.  Patient is unmarried.  She has no children.  She lives at home with her mother and father. She has two sisters who are involved in her care.   ADVANCE DIRECTIVES:  Reportedly patient's mother is her legal guardian  CODE STATUS: DNR/DNI (DNR form signed on 01/30/2021)  PAST MEDICAL HISTORY: Past Medical History:  Diagnosis Date   Breast cancer ([place]) 03/2020   left   Family history of breast cancer    Family history of stomach cancer    Family history of stomach cancer    GERD (gastroesophageal reflux disease)    Hypothyroidism    IUD (intrauterine device) in place IUD was placed in 2016   Leiomyoma of uterus    Schizophrenia ([place])    Thyroid disease     PAST SURGICAL HISTORY:  Past Surgical History:  Procedure Laterality Date   ADJACENT TISSUE TRANSFER/TISSUE REARRANGEMENT N/A 10/29/2020   Procedure: POSSIBLE ADJACENT TISSUE TRANSFER, TISSUE REARRANGEMENT;  Surgeon: Wallace Going, DO;  Location: [facility] ORS;  Service: Plastics;   Laterality: N/A;   APPLICATION OF A-CELL OF CHEST/ABDOMEN N/A 10/29/2020   Procedure: POSSIBLE APPLICATION OF A-CELL OF CHEST/ABDOMEN;  Surgeon: Wallace Going, DO;  Location: [facility] OR[facility];  Service: Plastics;  Laterality: N/A;   APPLICATION OF WOUND VAC N/A 10/29/2020   Procedure: POSSIBLE APPLICATION OF WOUND VAC;  Surgeon: Wallace Going, DO;  Location: [facility] OR[facility];  Service: Plastics;  Laterality: N/A;   BREAST BIOPSY Left 03/2020   [facility]- Triple neg- Stage 3   PORTACATH PLACEMENT Right 05/02/2020   Procedure: INSERTION PORT-A-CATH;  Surgeon: Herbert Pun, MD;  Location: [facility] ORS;  Service: General;  Laterality: Right;   TOTAL MASTECTOMY Left 10/29/2020   Procedure: TOTAL MASTECTOMY --- RADICAL;  Surgeon: Herbert Pun, MD;  Location: [facility] ORS;  Service: General;  Laterality: Left;  needs 3 hrs    HEMATOLOGY/ONCOLOGY HISTORY:  Oncology History Overview Note  # NOV 2021- LEFT BREAST CANCER - [place]; TRIPLE NEGATIVE; Grade of invasive carcinoma: Grade 3; # LYMPH NODE, LEFT AXILLA; [Dr.Cintron]; ULTRASOUND-GUIDED CORE BIOPSY:  - METASTATIC MAMMARY CARCINOMA, AT LEAST 9 MM METASTASIS. ]; DEC 9th PET scan-Intensely hypermetabolic 23.9 cm left breast mass compatible with primary left breast malignancy, with left pectoralis muscle involvement posteriorly and cutaneous involvement by the mass. Multiple smaller hypermetabolic satellite malignant nodules scattered throughout the left breast. Bulky hypermetabolic left axillary and left retropectoral nodal metastases.   # 12/10- Carboplatin ONLY;  Taxol hypersensitivity reaction-discontinued  # 12/17- AC #1 q 3 W x4; MARCH 28th- CARBO-ABRAXANE-KEYTRUDA; #cycle 1-8 [abraxane dose reduced to 75/m2- sec to diarrhea-3]  DIAGNOSIS:  A. LEFT BREAST; RADICAL  MASTECTOMY:  - INVASIVE MAMMARY CARCINOMA.  - METASTATIC CARCINOMA INVOLVING ONE OF TWELVE LYMPH NODES (1/12).  - SEE CANCER SUMMARY BELOW.   B. AXILLARY TISSUE, LEFT; RESECTION:  - TWO  LYMPH NODES, NEGATIVE FOR METASTATIC CARCINOMA (0/2).   CANCER CASE SUMMARY: INVASIVE CARCINOMA OF THE BREAST  Standard(s): AJCC-UICC 8   SPECIMEN  Procedure: Radical mastectomy  Specimen Laterality: Left   TUMOR  Histologic Type: Invasive mammary carcinoma of no special type  Histologic Grade (Nottingham Histologic Score)                       Glandular (Acinar)/Tubular Differentiation: 3                       Nuclear Pleomorphism: 3                       Mitotic Rate: 3                       Overall Grade: 3  Tumor Size: 82 x 77 x 68 mm, separate 2 mm focus within randomly sampled  upper inner quadrant  Ductal Carcinoma In Situ (DCIS): Not identified  Lymphovascular Invasion: Present  Treatment Effect in the Breast: Treatment Effect in the Breast: No  definite response to presurgical therapy in the invasive carcinoma  Treatment Effect in the Lymph Nodes: Probable or definite response to  presurgical therapy in metastatic carcinoma  Residual Cancer Burden (RCB) from [facility] calculator (website  below):       Primary Tumor Bed            Primary tumor bed: 82 mm x 77 mm            Overall cancer cellularity: 100%            Percentage of cancer that is in situ: 0%       Lymph nodes            Number of lymph nodes positive for metastasis: 1            Diameter of largest metastasis: 8 mm       Residual Cancer Burden: 4.37       Residual Cancer Burden Class: RCB-III   [email or web address]    MARGINS  Margin Status for Invasive Carcinoma: All margins negative for invasive  carcinoma                       Distance from closest margin: 3 mm                       Specify closest margin: Posterior   Margin Status for DCIS: Not applicable   REGIONAL LYMPH NODES  Regional Lymph Node Status: Tumor present in regional lymph node(s)                       Number of Lymph Nodes with Macrometastases (greater  than 2 mm): 1                        Number of Lymph Nodes with Micrometastases (greater  than 0.2 mm to 2 mm and/or greater than 200 cells): 0                       Number of Lymph Nodes with Isolated  Tumor Cells  (0.2 mm or less OR 200 cells or less): 0                       Size of Largest Metastatic Deposit: 8 mm                       Extranodal Extension: Not identified                       Total Number of Lymph Nodes Examined (sentinel and  non-sentinel): 14                       Number of Sentinel Nodes Examined: 0  DISTANT METASTASIS  Distant Site(s) Involved, if applicable: Not applicable   PATHOLOGIC STAGE CLASSIFICATION (pTNM, AJCC 8th Edition):  TNM Descriptors: M (multifocal)  pT3  Regional Lymph Nodes Modifier: Not applicable  pN 1a  pM - Not applicable   # NOV [id number] (by immunohistochemistry): NEGATIVE (Score 0); Foundation 1: PD-L1 negative; no targets** RAD 51  # SURVIVORSHIP:   # GENETICS: p  DIAGNOSIS: Triple negative breast cancer  STAGE: Stage IV     ;  GOALS: palliative  CURRENT/MOST RECENT THERAPY : RT    Carcinoma of overlapping sites of left breast in female, estrogen receptor negative ([place])  04/24/2020 Initial Diagnosis   Carcinoma of overlapping sites of left breast in female, estrogen receptor negative ([place])   05/04/2020 - 05/04/2020 Chemotherapy   The patient had dexamethasone (DECADRON) 4 MG tablet, 8 mg, Oral, Daily, 1 of 1 cycle, Start date: --, End date: -- palonosetron (ALOXI) injection 0.25 mg, 0.25 mg, Intravenous,  Once, 1 of 12 cycles Administration: 0.25 mg (05/04/2020) CARBOplatin (PARAPLATIN) 220 mg in sodium chloride 0.9 % 250 mL chemo infusion, 220 mg (100 % of original dose 222.4 mg), Intravenous,  Once, 1 of 12 cycles Dose modification:   (original dose 222.4 mg, Cycle 1) Administration: 220 mg (05/04/2020) PACLitaxel (TAXOL) 132 mg in sodium chloride 0.9 % 250 mL chemo infusion (</= 80m/m2), 80 mg/m2 = 132 mg, Intravenous,  Once, 1 of 12  cycles Administration: 132 mg (05/04/2020)  for chemotherapy treatment.    05/11/2020 - 07/31/2020 Chemotherapy         06/14/2020 Cancer Staging   Staging form: Breast, AJCC 8th Edition - Clinical: Stage IIIC (cT4, cN2b, cM0, G3, ER-, PR-, HER2-) - Signed by BCammie Sickle MD on 06/14/2020   08/20/2020 - 09/26/2020 Chemotherapy         01/25/2021 -  Chemotherapy    Patient is on Treatment Plan: BREAST METASTATIC SACITUZUMAB GOVITECAN-HZIY (TRODELVY) Q21D        ALLERGIES:  is allergic to paclitaxel.  MEDICATIONS:  Current Outpatient Medications  Medication Sig Dispense Refill   benztropine (COGENTIN) 0.5 MG tablet Take 1 tablet (0.5 mg total) by mouth 2 (two) times daily. 60 tablet 1   cetirizine (ZYRTEC) 10 MG tablet Take 1 tablet (10 mg total) by mouth daily as needed for allergies.     clonazePAM (KLONOPIN) 0.5 MG tablet Take 0.5 mg by mouth at bedtime.     divalproex (DEPAKOTE) 500 MG DR tablet Take 1 tablet (500 mg total) by mouth every 12 (twelve) hours. 60 tablet 1   fluticasone (FLONASE) 50 MCG/ACT nasal spray Place 1 spray into both nostrils daily. (Patient taking differently: Place 1 spray into both nostrils daily as needed for allergies.) 11.1 mL 9  haloperidol (HALDOL) 5 MG tablet Take 1 tablet (5 mg total) by mouth 3 (three) times daily. 90 tablet 1   haloperidol decanoate (HALDOL DECANOATE) 100 MG/ML injection Inject 100 mg into the muscle every 28 (twenty-eight) days.     HYDROcodone-acetaminophen (NORCO/VICODIN) 5-325 MG tablet Take 1 tablet by mouth every 4 (four) hours as needed for moderate pain. 20 tablet 0   hydrOXYzine (ATARAX/VISTARIL) 25 MG tablet Take 1 tablet (25 mg total) by mouth every 8 (eight) hours as needed. 90 tablet 3   KLOR-CON M20 20 MEQ tablet TAKE 1 TABLET BY MOUTH TWICE A DAY 60 tablet 0   levothyroxine (SYNTHROID) 88 MCG tablet TAKE 1 TABLET (88 MCG TOTAL) BY MOUTH DAILY AT 6 (SIX) AM. 30 tablet 1   lidocaine-prilocaine (EMLA) cream  Apply 1 application topically as needed. 30 g 3   medroxyPROGESTERone (DEPO-PROVERA) 150 MG/ML injection Inject 150 mg into the muscle every 3 (three) months.     MELATONIN PO Take 1 tablet by mouth at bedtime as needed (sleep).     ondansetron (ZOFRAN) 8 MG tablet One pill every 8 hours as needed for nausea/vomitting. (Patient taking differently: Take 8 mg by mouth every 8 (eight) hours as needed for vomiting or nausea.) 40 tablet 1   prochlorperazine (COMPAZINE) 10 MG tablet Take 1 tablet (10 mg total) by mouth every 6 (six) hours as needed for nausea or vomiting. 40 tablet 1   silver sulfADIAZINE (SILVADENE) 1 % cream Apply 1 application topically daily. 50 g 1   sodium chloride 1 g tablet Take 1 tablet (1 g total) by mouth 3 (three) times daily. 90 tablet 0   No current facility-administered medications for this visit.   Facility-Administered Medications Ordered in Other Visits  Medication Dose Route Frequency Provider Last Rate Last Admin   sodium chloride flush (NS) 0.9 % injection 10 mL  10 mL Intravenous PRN Charlaine Dalton R, MD   10 mL at 06/14/20 0840    VITAL SIGNS: BP 107/72   Pulse 100   Temp 98.5 F (36.9 C) (Tympanic)   Resp 18   SpO2 100%  There were no vitals filed for this visit.  Estimated body mass index is 28.35 kg/m as calculated from the following:   Height as of 01/25/21: 5' 2"  (1.575 m).   Weight as of 01/25/21: 155 lb (70.3 kg).  LABS: CBC:    Component Value Date/Time   WBC 6.0 01/25/2021 0849   HGB 10.3 (L) 01/25/2021 0849   HGB 13.4 11/11/2013 2142   HCT 29.6 (L) 01/25/2021 0849   HCT 39.3 11/11/2013 2142   PLT 190 01/25/2021 0849   PLT 295 11/11/2013 2142   MCV 87.6 01/25/2021 0849   MCV 90 11/11/2013 2142   NEUTROABS 4.1 01/25/2021 0849   LYMPHSABS 0.3 (L) 01/25/2021 0849   MONOABS 1.5 (H) 01/25/2021 0849   EOSABS 0.0 01/25/2021 0849   BASOSABS 0.0 01/25/2021 0849   Comprehensive Metabolic Panel:    Component Value Date/Time   NA 123  (L) 01/25/2021 0849   NA 135 (L) 11/14/2013 1309   K 3.5 01/25/2021 0849   K 3.9 11/14/2013 1309   CL 88 (L) 01/25/2021 0849   CL 97 (L) 11/14/2013 1309   CO2 25 01/25/2021 0849   CO2 29 11/14/2013 1309   BUN <5 (L) 01/25/2021 0849   BUN 3 (L) 11/14/2013 1309   CREATININE 0.60 01/25/2021 0849   CREATININE 0.88 11/14/2013 1309   GLUCOSE 96 01/25/2021 0849  GLUCOSE 117 (H) 11/14/2013 1309   CALCIUM 9.0 01/25/2021 0849   CALCIUM 9.6 11/14/2013 1309   AST 12 (L) 01/25/2021 0849   AST 25 11/11/2013 2142   ALT 6 01/25/2021 0849   ALT 18 11/11/2013 2142   ALKPHOS 49 01/25/2021 0849   ALKPHOS 111 11/11/2013 2142   BILITOT 0.5 01/25/2021 0849   BILITOT 0.3 11/11/2013 2142   PROT 6.8 01/25/2021 0849   PROT 8.1 11/11/2013 2142   ALBUMIN 3.3 (L) 01/25/2021 0849   ALBUMIN 3.8 11/11/2013 2142    RADIOGRAPHIC STUDIES: No results found.  PERFORMANCE STATUS (ECOG) : 1 - Symptomatic but completely ambulatory  Review of Systems Unless otherwise noted, a complete review of systems is negative.  Physical Exam General: NAD Pulmonary: Unlabored Extremities: no edema, no joint deformities Skin: Substantial fungating lesions to left breast Neurological: Weakness, confusion  IMPRESSION: Routine follow-up.  Patient was accompanied by her mother.  Patient has had progressive disease and left breast now with probable disease in the right.  She is pending punch biopsy tomorrow with Dr. Peyton Najjar.  Plan is likely to reinitiate systemic chemotherapy.  However, prognosis is poor.  I met privately with patient's mother who verbalized understanding that cancer is incurable and will ultimately result in patient's passing.  She spoke candidly about patient going to heaven and being with family.  We discussed CODE STATUS.  [name] stated clearly and repeatedly that she would not want to see patient resuscitated nor have her life prolonged artificially on machines.  She stated that she would also  not want that for herself.  She attributed heroic measures at end-of-life to be a cause of "suffering."  She was in agreement with DNR/DNI.  I signed a DNR order for her to take home.  Symptomatically, patient has had persistent and severe pain in the left breast.  Mother was not entirely clear on what she was giving patient for pain.  Patient is prescribed Norco and mother thought that she was giving that twice daily.  Additionally, patient was taking acetaminophen, which was not effective.  We discussed pain regimen in detail.  I recommended the patient liberalize dosing of Norco to every 4-6 hours around-the-clock.  Would consider starting her on a long-acting opioid if needed.  We discussed initiation of a bowel regimen if needed to prevent opioid-induced constipation.  Patient is currently having regular bowel movements.  Oral intake is stable.  PLAN: -Continue current scope of treatment --Norco 1 tablet every 4-6 hours as needed -Continue daily bowel regimen -DNR/DNI -Follow-up MyChart visit 2 to 3 weeks  Case discussed with Dr. Rogue Bussing   Patient expressed understanding and was in agreement with this plan. She also understands that She can call the clinic at any time with any questions, concerns, or complaints.     Time Total: 30 minutes  Visit consisted of counseling and education dealing with the complex and emotionally intense issues of symptom management and palliative care in the setting of serious and potentially life-threatening illness.Greater than 50%  of this time was spent counseling and coordinating care related to the above assessment and plan.  Signed by: Altha Harm, PhD, NP-C

## 2021-01-30 NOTE — Progress Notes (Signed)
Pt reports increased pain to right breast and left axillary area. Reports that it has worsened over the past week and disrupts her sleep. She normally takes Tylenol, but states she has ran out.

## 2021-01-31 ENCOUNTER — Ambulatory Visit
Admission: RE | Admit: 2021-01-31 | Discharge: 2021-01-31 | Disposition: A | Discharge status: Home / Self Care | Payer: Medicaid Other | Source: Ambulatory Visit | Attending: Radiation Oncology | Admitting: Radiation Oncology

## 2021-01-31 ENCOUNTER — Other Ambulatory Visit: Payer: Self-pay | Admitting: General Surgery

## 2021-01-31 DIAGNOSIS — Z5111 Encounter for antineoplastic chemotherapy: Secondary | ICD-10-CM | POA: Diagnosis not present

## 2021-02-01 ENCOUNTER — Inpatient Hospital Stay: Payer: Medicaid Other | Admitting: Hospice and Palliative Medicine

## 2021-02-01 ENCOUNTER — Ambulatory Visit: Payer: Medicaid Other

## 2021-02-01 ENCOUNTER — Inpatient Hospital Stay (HOSPITAL_BASED_OUTPATIENT_CLINIC_OR_DEPARTMENT_OTHER): Payer: Medicaid Other | Admitting: Internal Medicine

## 2021-02-01 ENCOUNTER — Other Ambulatory Visit: Payer: Self-pay

## 2021-02-01 ENCOUNTER — Inpatient Hospital Stay: Payer: Medicaid Other

## 2021-02-01 VITALS — BP 104/67 | HR 100

## 2021-02-01 DIAGNOSIS — C50812 Malignant neoplasm of overlapping sites of left female breast: Secondary | ICD-10-CM

## 2021-02-01 DIAGNOSIS — Z171 Estrogen receptor negative status [ER-]: Secondary | ICD-10-CM

## 2021-02-01 DIAGNOSIS — Z5111 Encounter for antineoplastic chemotherapy: Secondary | ICD-10-CM | POA: Diagnosis not present

## 2021-02-01 DIAGNOSIS — Z7189 Other specified counseling: Secondary | ICD-10-CM

## 2021-02-01 LAB — CBC WITH DIFFERENTIAL/PLATELET
Abs Immature Granulocytes: 0.06 10*3/uL (ref 0.00–0.07)
Basophils Absolute: 0 10*3/uL (ref 0.0–0.1)
Basophils Relative: 0 %
Eosinophils Absolute: 0 10*3/uL (ref 0.0–0.5)
Eosinophils Relative: 0 %
HCT: 30 % — ABNORMAL LOW (ref 36.0–46.0)
Hemoglobin: 10.5 g/dL — ABNORMAL LOW (ref 12.0–15.0)
Immature Granulocytes: 1 %
Lymphocytes Relative: 5 %
Lymphs Abs: 0.3 10*3/uL — ABNORMAL LOW (ref 0.7–4.0)
MCH: 30.8 pg (ref 26.0–34.0)
MCHC: 35 g/dL (ref 30.0–36.0)
MCV: 88 fL (ref 80.0–100.0)
Monocytes Absolute: 1.9 10*3/uL — ABNORMAL HIGH (ref 0.1–1.0)
Monocytes Relative: 27 %
Neutro Abs: 4.5 10*3/uL (ref 1.7–7.7)
Neutrophils Relative %: 67 %
Platelets: 199 10*3/uL (ref 150–400)
RBC: 3.41 MIL/uL — ABNORMAL LOW (ref 3.87–5.11)
RDW: 14 % (ref 11.5–15.5)
WBC: 6.9 10*3/uL (ref 4.0–10.5)
nRBC: 0 % (ref 0.0–0.2)

## 2021-02-01 LAB — COMPREHENSIVE METABOLIC PANEL
ALT: 7 U/L (ref 0–44)
AST: 14 U/L — ABNORMAL LOW (ref 15–41)
Albumin: 3.1 g/dL — ABNORMAL LOW (ref 3.5–5.0)
Alkaline Phosphatase: 54 U/L (ref 38–126)
Anion gap: 11 (ref 5–15)
BUN: <5 mg/dL — ABNORMAL LOW (ref 6–20)
CO2: 26 mmol/L (ref 22–32)
Calcium: 9.3 mg/dL (ref 8.9–10.3)
Chloride: 92 mmol/L — ABNORMAL LOW (ref 98–111)
Creatinine, Ser: 0.54 mg/dL (ref 0.44–1.00)
GFR, Estimated: >60 mL/min (ref >60)
Glucose, Bld: 92 mg/dL (ref 70–99)
Potassium: 3.5 mmol/L (ref 3.5–5.1)
Sodium: 129 mmol/L — ABNORMAL LOW (ref 135–145)
Total Bilirubin: 0.6 mg/dL (ref 0.3–1.2)
Total Protein: 6.8 g/dL (ref 6.5–8.1)

## 2021-02-01 MED ORDER — SODIUM CHLORIDE 0.9 % IV SOLN
Freq: Once | INTRAVENOUS | Status: AC
Start: 2021-02-01 — End: 2021-02-01
  Filled 2021-02-01: qty 250

## 2021-02-01 MED ORDER — SODIUM CHLORIDE 0.9 % IV SOLN
10.0000 mg | Freq: Once | INTRAVENOUS | Status: AC
  Administered 2021-02-01: 10 mg via INTRAVENOUS
  Filled 2021-02-01: qty 10

## 2021-02-01 MED ORDER — DIPHENOXYLATE-ATROPINE 2.5-0.025 MG PO TABS: 1.0000 | ORAL_TABLET | Freq: Four times a day (QID) | ORAL | 0 refills | Status: AC | PRN

## 2021-02-01 MED ORDER — SODIUM CHLORIDE 0.9 % IV SOLN
Freq: Once | INTRAVENOUS | Status: AC
  Filled 2021-02-01: qty 250

## 2021-02-01 MED ORDER — HEPARIN SOD (PORK) LOCK FLUSH 100 UNIT/ML IV SOLN
500.0000 [IU] | Freq: Once | INTRAVENOUS | Status: DC | PRN
  Filled 2021-02-01: qty 5

## 2021-02-01 MED ORDER — ACETAMINOPHEN 325 MG PO TABS
650.0000 mg | ORAL_TABLET | Freq: Once | ORAL | Status: AC
  Administered 2021-02-01: 650 mg via ORAL
  Filled 2021-02-01: qty 2

## 2021-02-01 MED ORDER — HEPARIN SOD (PORK) LOCK FLUSH 100 UNIT/ML IV SOLN
500.0000 [IU] | Freq: Once | INTRAVENOUS | Status: AC
  Administered 2021-02-01: 500 [IU] via INTRAVENOUS
  Filled 2021-02-01: qty 5

## 2021-02-01 MED ORDER — SODIUM CHLORIDE 0.9 % IV SOLN
150.0000 mg | Freq: Once | INTRAVENOUS | Status: AC
  Administered 2021-02-01: 150 mg via INTRAVENOUS
  Filled 2021-02-01: qty 150

## 2021-02-01 MED ORDER — HEPARIN SOD (PORK) LOCK FLUSH 100 UNIT/ML IV SOLN
INTRAVENOUS | Status: AC
  Filled 2021-02-01: qty 5

## 2021-02-01 MED ORDER — HYDROCODONE-ACETAMINOPHEN 5-325 MG PO TABS: 1.0000 | ORAL_TABLET | Freq: Two times a day (BID) | ORAL | 0 refills | Status: DC | PRN

## 2021-02-01 MED ORDER — FAMOTIDINE 20 MG IN NS 100 ML IVPB
20.0000 mg | Freq: Once | INTRAVENOUS | Status: AC
  Administered 2021-02-01: 20 mg via INTRAVENOUS
  Filled 2021-02-01: qty 20

## 2021-02-01 MED ORDER — SODIUM CHLORIDE 0.9% FLUSH
10.0000 mL | Freq: Once | INTRAVENOUS | Status: DC
  Filled 2021-02-01: qty 10

## 2021-02-01 MED ORDER — PALONOSETRON HCL INJECTION 0.25 MG/5ML
0.2500 mg | Freq: Once | INTRAVENOUS | Status: AC
  Administered 2021-02-01: 0.25 mg via INTRAVENOUS
  Filled 2021-02-01: qty 5

## 2021-02-01 MED ORDER — ATROPINE SULFATE 1 MG/ML IJ SOLN
0.5000 mg | Freq: Once | INTRAMUSCULAR | Status: AC | PRN
  Administered 2021-02-01: 0.5 mg via INTRAVENOUS
  Filled 2021-02-01: qty 1

## 2021-02-01 MED ORDER — SODIUM CHLORIDE 0.9 % IV SOLN
10.0000 mg/kg | Freq: Once | INTRAVENOUS | Status: AC
  Administered 2021-02-01: 720 mg via INTRAVENOUS
  Filled 2021-02-01: qty 72

## 2021-02-01 MED ORDER — DIPHENHYDRAMINE HCL 50 MG/ML IJ SOLN
50.0000 mg | Freq: Once | INTRAMUSCULAR | Status: AC
  Administered 2021-02-01: 50 mg via INTRAVENOUS
  Filled 2021-02-01: qty 1

## 2021-02-01 NOTE — Progress Notes (Signed)
Proceed with treatment with HR of 102

## 2021-02-01 NOTE — Patient Instructions (Signed)
[facility]  Discharge Instructions: Thank you for choosing [facility] to provide your oncology and hematology care.  If you have a lab appointment with the [facility], please go directly to the [facility] and check in at the registration area.  Wear comfortable clothing and clothing appropriate for easy access to any Portacath or PICC line.   We strive to give you quality time with your provider. You may need to reschedule your appointment if you arrive late (15 or more minutes).  Arriving late affects you and other patients whose appointments are after yours.  Also, if you miss three or more appointments without notifying the office, you may be dismissed from the clinic at the provider's discretion.      For prescription refill requests, have your pharmacy contact our office and allow 72 hours for refills to be completed.    Today you received the following chemotherapy and/or immunotherapy agents: Ivette Loyal      To help prevent nausea and vomiting after your treatment, we encourage you to take your nausea medication as directed.  BELOW ARE SYMPTOMS THAT SHOULD BE REPORTED IMMEDIATELY: *FEVER GREATER THAN 100.4 F (38 C) OR HIGHER *CHILLS OR SWEATING *NAUSEA AND VOMITING THAT IS NOT CONTROLLED WITH YOUR NAUSEA MEDICATION *UNUSUAL SHORTNESS OF BREATH *UNUSUAL BRUISING OR BLEEDING *URINARY PROBLEMS (pain or burning when urinating, or frequent urination) *BOWEL PROBLEMS (unusual diarrhea, constipation, pain near the anus) TENDERNESS IN MOUTH AND THROAT WITH OR WITHOUT PRESENCE OF ULCERS (sore throat, sores in mouth, or a toothache) UNUSUAL RASH, SWELLING OR PAIN  UNUSUAL VAGINAL DISCHARGE OR ITCHING   Items with * indicate a potential emergency and should be followed up as soon as possible or go to the Emergency Department if any problems should occur.  Please show the CHEMOTHERAPY ALERT CARD or IMMUNOTHERAPY ALERT CARD at check-in  to the Emergency Department and triage nurse.  Should you have questions after your visit or need to cancel or reschedule your appointment, please contact [facility]  [phone] and follow the prompts.  Office hours are 8:00 a.m. to 4:30 p.m. Monday - Friday. Please note that voicemails left after 4:00 p.m. may not be returned until the following business day.  We are closed weekends and major holidays. You have access to a nurse at all times for urgent questions. Please call the main number to the clinic [phone] and follow the prompts.  For any non-urgent questions, you may also contact your provider using MyChart. We now offer e-Visits for anyone 38 and older to request care online for non-urgent symptoms. For details visit [email or web address].   Also download the MyChart app! Go to the app store, search "MyChart", open the app, select [facility], and log in with your MyChart username and password.  Due to Covid, a mask is required upon entering the hospital/clinic. If you do not have a mask, one will be given to you upon arrival. For doctor visits, patients may have 1 support person aged 41 or older with them. For treatment visits, patients cannot have anyone with them due to current Covid guidelines and our immunocompromised population. Sacituzumab Govitecan Injection What is this medication? SACITUZUMAB GOVITECAN (SAK i TOOZ ue mab GOE vi TEE kan) is a monoclonal antibody combined with chemotherapy. It is used to treat breast cancer and urothelial cancer. This medicine may be used for other purposes; ask your health care provider or pharmacist if you have questions. COMMON BRAND NAME(S):  TRODELVY What should I tell my care team before I take this medication? They need to know if you have any of these conditions: diarrhea infection (especially a virus infection such as chickenpox, cold sores, or herpes) liver disease low blood counts, like low  white cell, platelet, or red cell counts an unusual or allergic reaction to sacituzumab govitecan, other medicines, foods, dyes, or preservatives pregnant or trying to get pregnant breast-feeding How should I use this medication? This medicine is for infusion into a vein. It is given by a healthcare professional in a hospital or clinic setting. Talk to your pediatrician about the use of this medicine in children. Special care may be needed. Overdosage: If you think you have taken too much of this medicine contact a poison control center or emergency room at once. NOTE: This medicine is only for you. Do not share this medicine with others. What if I miss a dose? Keep appointments for follow-up doses. It is important not to miss your dose. Call your doctor or health care professional if you are unable to keep an appointment. What may interact with this medication? certain antivirals for HIV or hepatitis gemfibrozil This list may not describe all possible interactions. Give your health care provider a list of all the medicines, herbs, non-prescription drugs, or dietary supplements you use. Also tell them if you smoke, drink alcohol, or use illegal drugs. Some items may interact with your medicine. What should I watch for while using this medication? Your condition will be monitored carefully while you are receiving this medicine. You may need blood work done while you are taking this medicine. This medicine can cause serious allergic reactions. To reduce your risk, you may need to take medicine before treatment with this medicine. Take your medicine as directed. Do not become pregnant while taking this medicine or for 6 months after stopping it. Women should inform their health care professional if they wish to become pregnant or think they might be pregnant. Men should not father a child while taking this medicine and for 3 months after stopping it. There is potential for serious side effects to an  unborn child. Talk to your health care professional for more information. Do not breast-feed a child while taking this medicine or for 1 month after stopping it. This medicine may increase your risk of getting an infection. Call your health care professional for advice if you get a fever, chills, or sore throat, or other symptoms of a cold or flu. Do not treat yourself. Try to avoid being around people who are sick. Avoid taking medicines that contain aspirin, acetaminophen, ibuprofen, naproxen, or ketoprofen unless instructed by your health care professional. These medicines may hide a fever. This medicine has caused ovarian failure in some women. This medicine may make it more difficult to get pregnant. Talk to your health care professional if you are concerned about your fertility. What side effects may I notice from receiving this medication? Side effects that you should report to your doctor or health care professional as soon as possible: allergic reactions like skin rash, itching or hives; swelling of the face, lips, or tongue diarrhea nausea/vomiting signs and symptoms of infection like fever; chills; cough; sore throat; pain or trouble passing urine signs and symptoms of low red blood cells or anemia such as unusually weak or tired; feeling faint or lightheaded; falls; breathing problems Side effects that usually do not require medical attention (report these to your doctor or health care professional if they  continue or are bothersome): constipation hair loss headache loss of appetite signs and symptoms of high blood sugar such as being more thirsty or hungry or having to urinate more than normal. You may also feel very tired or have blurry vision. trouble sleeping This list may not describe all possible side effects. Call your doctor for medical advice about side effects. You may report side effects to FDA at 1-800-FDA-1088. Where should I keep my medication? This medicine is given in  a hospital or clinic and will not be stored at home. NOTE: This sheet is a summary. It may not cover all possible information. If you have questions about this medicine, talk to your doctor, pharmacist, or health care provider.  2022 Elsevier/Gold Standard (2019-09-09 11:59:29)

## 2021-02-01 NOTE — Progress Notes (Signed)
HR 102. Okay to proceed per Dr Rogue Bussing  Patient tolerated treatment well. Stable at discharge. AVS reviewed and copy given to patient

## 2021-02-01 NOTE — Progress Notes (Signed)
[place] CONSULT NOTE  Patient Care Team: Physicians, [place] as PCP - General Vladimir Crofts, MD as Consulting Physician (Neurology) Cammie Sickle, MD as Consulting Physician (Internal Medicine) Randel Pigg, MD as Consulting Physician (Psychiatry) Herbert Pun, MD as Consulting Physician (General Surgery)  CHIEF COMPLAINTS/PURPOSE OF CONSULTATION: Breast cancer  #  Oncology History Overview Note  # NOV 2021- LEFT BREAST CANCER - [facility]; TRIPLE NEGATIVE; Grade of invasive carcinoma: Grade 3; # LYMPH NODE, LEFT AXILLA; [Dr.Cintron]; ULTRASOUND-GUIDED CORE BIOPSY:  - METASTATIC MAMMARY CARCINOMA, AT LEAST 9 MM METASTASIS. ]; DEC 9th PET scan-Intensely hypermetabolic 75.1 cm left breast mass compatible with primary left breast malignancy, with left pectoralis muscle involvement posteriorly and cutaneous involvement by the mass. Multiple smaller hypermetabolic satellite malignant nodules scattered throughout the left breast. Bulky hypermetabolic left axillary and left retropectoral nodal metastases.   # 12/10- Carboplatin ONLY;  Taxol hypersensitivity reaction-discontinued  # 12/17- AC #1 q 3 W x4; MARCH 28th- CARBO-ABRAXANE-KEYTRUDA; #cycle 1-8 [abraxane dose reduced to 75/m2- sec to diarrhea-3]  DIAGNOSIS:  A. LEFT BREAST; RADICAL MASTECTOMY:  - INVASIVE MAMMARY CARCINOMA.  - METASTATIC CARCINOMA INVOLVING ONE OF TWELVE LYMPH NODES (1/12).  - SEE CANCER SUMMARY BELOW.   B. AXILLARY TISSUE, LEFT; RESECTION:  - TWO LYMPH NODES, NEGATIVE FOR METASTATIC CARCINOMA (0/2).   CANCER CASE SUMMARY: INVASIVE CARCINOMA OF THE BREAST  Standard(s): AJCC-UICC 8   SPECIMEN  Procedure: Radical mastectomy  Specimen Laterality: Left   TUMOR  Histologic Type: Invasive mammary carcinoma of no special type  Histologic Grade (Nottingham Histologic Score)                       Glandular (Acinar)/Tubular Differentiation: 3                       Nuclear  Pleomorphism: 3                       Mitotic Rate: 3                       Overall Grade: 3  Tumor Size: 82 x 77 x 68 mm, separate 2 mm focus within randomly sampled  upper inner quadrant  Ductal Carcinoma In Situ (DCIS): Not identified  Lymphovascular Invasion: Present  Treatment Effect in the Breast: Treatment Effect in the Breast: No  definite response to presurgical therapy in the invasive carcinoma  Treatment Effect in the Lymph Nodes: Probable or definite response to  presurgical therapy in metastatic carcinoma  Residual Cancer Burden (RCB) from [facility] calculator (website  below):       Primary Tumor Bed            Primary tumor bed: 82 mm x 77 mm            Overall cancer cellularity: 100%            Percentage of cancer that is in situ: 0%       Lymph nodes            Number of lymph nodes positive for metastasis: 1            Diameter of largest metastasis: 8 mm       Residual Cancer Burden: 4.37       Residual Cancer Burden Class: RCB-III   [email or web address]    MARGINS  Margin Status for Invasive Carcinoma: All margins  negative for invasive  carcinoma                       Distance from closest margin: 3 mm                       Specify closest margin: Posterior   Margin Status for DCIS: Not applicable   REGIONAL LYMPH NODES  Regional Lymph Node Status: Tumor present in regional lymph node(s)                       Number of Lymph Nodes with Macrometastases (greater  than 2 mm): 1                       Number of Lymph Nodes with Micrometastases (greater  than 0.2 mm to 2 mm and/or greater than 200 cells): 0                       Number of Lymph Nodes with Isolated Tumor Cells  (0.2 mm or less OR 200 cells or less): 0                       Size of Largest Metastatic Deposit: 8 mm                       Extranodal Extension: Not identified                       Total Number of Lymph Nodes Examined (sentinel and   non-sentinel): 14                       Number of Sentinel Nodes Examined: 0  DISTANT METASTASIS  Distant Site(s) Involved, if applicable: Not applicable   PATHOLOGIC STAGE CLASSIFICATION (pTNM, AJCC 8th Edition):  TNM Descriptors: M (multifocal)  pT3  Regional Lymph Nodes Modifier: Not applicable  pN 1a  pM - Not applicable   # NOV [id number] (by immunohistochemistry): NEGATIVE (Score 0); Foundation 1: PD-L1 negative; no targets** RAD 51  # SURVIVORSHIP:   # GENETICS: p  DIAGNOSIS: Triple negative breast cancer  STAGE: Stage IV     ;  GOALS: palliative  CURRENT/MOST RECENT THERAPY : RT    Carcinoma of overlapping sites of left breast in female, estrogen receptor negative ([place])  04/24/2020 Initial Diagnosis   Carcinoma of overlapping sites of left breast in female, estrogen receptor negative ([place])   05/04/2020 - 05/04/2020 Chemotherapy   The patient had dexamethasone (DECADRON) 4 MG tablet, 8 mg, Oral, Daily, 1 of 1 cycle, Start date: --, End date: -- palonosetron (ALOXI) injection 0.25 mg, 0.25 mg, Intravenous,  Once, 1 of 12 cycles Administration: 0.25 mg (05/04/2020) CARBOplatin (PARAPLATIN) 220 mg in sodium chloride 0.9 % 250 mL chemo infusion, 220 mg (100 % of original dose 222.4 mg), Intravenous,  Once, 1 of 12 cycles Dose modification:   (original dose 222.4 mg, Cycle 1) Administration: 220 mg (05/04/2020) PACLitaxel (TAXOL) 132 mg in sodium chloride 0.9 % 250 mL chemo infusion (</= 99m/m2), 80 mg/m2 = 132 mg, Intravenous,  Once, 1 of 12 cycles Administration: 132 mg (05/04/2020)   for chemotherapy treatment.     05/11/2020 - 07/31/2020 Chemotherapy          06/14/2020 Cancer Staging   Staging form: Breast, AJCC 8th Edition -  Clinical: Stage IIIC (cT4, cN2b, cM0, G3, ER-, PR-, HER2-) - Signed by Cammie Sickle, MD on 06/14/2020   08/20/2020 - 09/26/2020 Chemotherapy          02/01/2021 -  Chemotherapy    Patient is on Treatment Plan: BREAST METASTATIC  SACITUZUMAB GOVITECAN-HZIY ([place]) Q21D         HISTORY OF PRESENTING ILLNESS: Patient is accompanied by her mother/patient is poor historian.  Tamara [name] 50 y.o.  female with schizophrenia and left locally advanced/metastatic triple negative breast cancer currently s/p  palliative radiation is here for follow-up.   Patient finished radiation on 09/08.   Patient in the interim evaluated by symptom management clinic for ongoing chest wall pain.  Patient also evaluated by surgery for punch biopsy of the contralateral right breast.  Pain is improved on current pain medication.-Taking hydrocodone 1 pill every day.  No nausea no vomiting.  Review of Systems  Unable to perform ROS: Psychiatric disorder    MEDICAL HISTORY:  Past Medical History:  Diagnosis Date   Breast cancer ([place]) 03/2020   left   Family history of breast cancer    Family history of stomach cancer    Family history of stomach cancer    GERD (gastroesophageal reflux disease)    Hypothyroidism    IUD (intrauterine device) in place IUD was placed in 2016   Leiomyoma of uterus    Schizophrenia ([place])    Thyroid disease     SURGICAL HISTORY: Past Surgical History:  Procedure Laterality Date   ADJACENT TISSUE TRANSFER/TISSUE REARRANGEMENT N/A 10/29/2020   Procedure: POSSIBLE ADJACENT TISSUE TRANSFER, TISSUE REARRANGEMENT;  Surgeon: Wallace Going, DO;  Location: [facility] ORS;  Service: Plastics;  Laterality: N/A;   APPLICATION OF A-CELL OF CHEST/ABDOMEN N/A 10/29/2020   Procedure: POSSIBLE APPLICATION OF A-CELL OF CHEST/ABDOMEN;  Surgeon: Wallace Going, DO;  Location: [facility] ORS;  Service: Plastics;  Laterality: N/A;   APPLICATION OF WOUND VAC N/A 10/29/2020   Procedure: POSSIBLE APPLICATION OF WOUND VAC;  Surgeon: Wallace Going, DO;  Location: [facility] ORS;  Service: Plastics;  Laterality: N/A;   BREAST BIOPSY Left 03/2020   [facility]- Triple neg- Stage 3   PORTACATH PLACEMENT Right 05/02/2020    Procedure: INSERTION PORT-A-CATH;  Surgeon: Herbert Pun, MD;  Location: [facility] ORS;  Service: General;  Laterality: Right;   TOTAL MASTECTOMY Left 10/29/2020   Procedure: TOTAL MASTECTOMY --- RADICAL;  Surgeon: Herbert Pun, MD;  Location: [facility] ORS;  Service: General;  Laterality: Left;  needs 3 hrs    SOCIAL HISTORY: Social History   Socioeconomic History   Marital status: Single    Spouse name: Not on file   Number of children: Not on file   Years of education: Not on file   Highest education level: Not on file  Occupational History   Not on file  Tobacco Use   Smoking status: Never   Smokeless tobacco: Never  Vaping Use   Vaping Use: Never used  Substance and Sexual Activity   Alcohol use: No   Drug use: No   Sexual activity: Yes    Partners: Male    Birth control/protection: Surgical, Injection  Other Topics Concern   Not on file  Social History Narrative   Not on file   Social Determinants of Health   Financial Resource Strain: Not on file  Food Insecurity: Not on file  Transportation Needs: Not on file  Physical Activity: Not on file  Stress: Not on file  Social Connections: Not on file  Intimate Partner Violence: Not on file    FAMILY HISTORY: Family History  Problem Relation Age of Onset   Breast cancer Mother    Stomach cancer Maternal Grandfather    Other Maternal Aunt        died from tumor, brain?    ALLERGIES:  is allergic to paclitaxel.  MEDICATIONS:  Current Outpatient Medications  Medication Sig Dispense Refill   benztropine (COGENTIN) 0.5 MG tablet Take 1 tablet (0.5 mg total) by mouth 2 (two) times daily. 60 tablet 1   cetirizine (ZYRTEC) 10 MG tablet Take 1 tablet (10 mg total) by mouth daily as needed for allergies.     clonazePAM (KLONOPIN) 0.5 MG tablet Take 0.5 mg by mouth at bedtime.     diphenoxylate-atropine (LOMOTIL) 2.5-0.025 MG tablet Take 1 tablet by mouth 4 (four) times daily as needed for diarrhea or loose  stools. Take it along with immodium 60 tablet 0   divalproex (DEPAKOTE) 500 MG DR tablet Take 1 tablet (500 mg total) by mouth every 12 (twelve) hours. 60 tablet 1   fluticasone (FLONASE) 50 MCG/ACT nasal spray Place 1 spray into both nostrils daily. (Patient taking differently: Place 1 spray into both nostrils daily as needed for allergies.) 11.1 mL 9   haloperidol (HALDOL) 5 MG tablet Take 1 tablet (5 mg total) by mouth 3 (three) times daily. 90 tablet 1   haloperidol decanoate (HALDOL DECANOATE) 100 MG/ML injection Inject 100 mg into the muscle every 28 (twenty-eight) days.     hydrOXYzine (ATARAX/VISTARIL) 25 MG tablet Take 1 tablet (25 mg total) by mouth every 8 (eight) hours as needed. 90 tablet 3   KLOR-CON M20 20 MEQ tablet TAKE 1 TABLET BY MOUTH TWICE A DAY 60 tablet 0   levothyroxine (SYNTHROID) 88 MCG tablet TAKE 1 TABLET (88 MCG TOTAL) BY MOUTH DAILY AT 6 (SIX) AM. 30 tablet 1   lidocaine-prilocaine (EMLA) cream Apply 1 application topically as needed. 30 g 3   medroxyPROGESTERone (DEPO-PROVERA) 150 MG/ML injection Inject 150 mg into the muscle every 3 (three) months.     MELATONIN PO Take 1 tablet by mouth at bedtime as needed (sleep).     ondansetron (ZOFRAN) 8 MG tablet One pill every 8 hours as needed for nausea/vomitting. (Patient taking differently: Take 8 mg by mouth every 8 (eight) hours as needed for vomiting or nausea.) 40 tablet 1   prochlorperazine (COMPAZINE) 10 MG tablet Take 1 tablet (10 mg total) by mouth every 6 (six) hours as needed for nausea or vomiting. 40 tablet 1   silver sulfADIAZINE (SILVADENE) 1 % cream Apply 1 application topically daily. 50 g 1   sodium chloride 1 g tablet Take 1 tablet (1 g total) by mouth 3 (three) times daily. 90 tablet 0   HYDROcodone-acetaminophen (NORCO/VICODIN) 5-325 MG tablet Take 1 tablet by mouth every 12 (twelve) hours as needed for moderate pain. 1 pill every 12 hours as needed 45 tablet 0   No current facility-administered  medications for this visit.   Facility-Administered Medications Ordered in Other Visits  Medication Dose Route Frequency Provider Last Rate Last Admin   heparin lock flush 100 UNIT/ML injection            heparin lock flush 100 unit/mL  500 Units Intracatheter Once PRN Cammie Sickle, MD       sodium chloride flush (NS) 0.9 % injection 10 mL  10 mL Intravenous PRN Cammie Sickle, MD  10 mL at 06/14/20 0840   sodium chloride flush (NS) 0.9 % injection 10 mL  10 mL Intravenous Once Charlaine Dalton R, MD          .  PHYSICAL EXAMINATION: ECOG PERFORMANCE STATUS: 1 - Symptomatic but completely ambulatory  Vitals:   02/01/21 0830  BP: 113/63  Pulse: (!) 102  Resp: 18  Temp: 99.3 F (37.4 C)  SpO2: 100%   Filed Weights   02/01/21 0830  Weight: 154 lb 1.6 oz (69.9 kg)    Physical Exam Constitutional:      Comments: Accompanied by mother.  Ambulating independently.  HENT:     Head: Normocephalic and atraumatic.     Mouth/Throat:     Pharynx: No oropharyngeal exudate.  Eyes:     Pupils: Pupils are equal, round, and reactive to light.  Cardiovascular:     Rate and Rhythm: Normal rate and regular rhythm.  Pulmonary:     Effort: No respiratory distress.     Breath sounds: No wheezing.  Abdominal:     General: Bowel sounds are normal. There is no distension.     Palpations: Abdomen is soft. There is no mass.     Tenderness: There is no abdominal tenderness. There is no guarding or rebound.  Musculoskeletal:        General: No tenderness. Normal range of motion.     Cervical back: Normal range of motion and neck supple.  Skin:    General: Skin is warm.     Comments: Left mastectomy site- significant local progression noted/see images below.  Contralateral right breast-erythema noted warmth noted.  Mild tenderness noted.  No lymphadenopathy in the right axilla  Neurological:     Mental Status: She is alert and oriented to person, place, and time.   Psychiatric:        Mood and Affect: Affect normal.     Comments: Poor insight/tangential thoughts.  Unable to make a coherent conversation-underlying schizophrenia.     LABORATORY DATA:  I have reviewed the data as listed Lab Results  Component Value Date   WBC 6.9 02/01/2021   HGB 10.5 (L) 02/01/2021   HCT 30.0 (L) 02/01/2021   MCV 88.0 02/01/2021   PLT 199 02/01/2021   Recent Labs    01/18/21 0839 01/25/21 0849 02/01/21 0758  NA 125* 123* 129*  K 3.3* 3.5 3.5  CL 89* 88* 92*  CO2 _0 GLUCOSE 77 96 92  BUN <5* <5* <5*  CREATININE 0.54 0.60 0.54  CALCIUM 9.1 9.0 9.3  GFRNONAA >60 >60 >60  PROT 7.0 6.8 6.8  ALBUMIN 3.4* 3.3* 3.1*  AST 14* 12* 14*  ALT _1 ALKPHOS 49 49 54  BILITOT 0.4 0.5 0.6    RADIOGRAPHIC STUDIES: I have personally reviewed the radiological images as listed and agreed with the findings in the report. No results found.     06/04/2020-     07/30/2020-    On 4/26-    09/26/2020    10/02/2020   5/25   12/04/2020   12/21/2020   On 8/14- while on RT    08/26      Contralateral right breast 8/26.    02/01/2021-     ASSESSMENT & PLAN:   Carcinoma of overlapping sites of left breast in female, estrogen receptor negative ([place]) #Locally recurrent recurrent/stage IV -left breast cancer T4 N2 M1 [subcutaneous nodules].  Currently undergoing palliative RT [08/04- 9/08].  However progressive disease noted  in the left breast [see images above]; also contralateral/right breast erythema-concerning for inflammatory breast cancer.   #Patient s/p palliative radiation on 09/08.  Proceed with Trodelvy-cycle number 1 day 1 today. Labs today reviewed;  acceptable for treatment today-except for low sodium 129.  Again reviewed the potential side effects including but not limited to neutropenia/diarrhea.  Prescribed Lomotil.  # Electrolytes abnormalities: chronic hyponatremia-likely secondary to SIDH/psychiatric  medication-stable.  Sodium today 129 stable; discussed re: free water restriction;  Potassium 3.1; continue 20 meq twice daily at home.  # schizophrenia: STABLE; Tritnity [phone]; Dr.Ahluwalia].  Itching-hydroxyzine prescribed.  #Prognosis : Unfortunately very poor-mother understands patient disease is incurable. s/p palliative care evaluation. DNR/DNI-  # DISPOSITION:  # Chemo today; IVFs 1 lit today  # follow up in 1 weeks- MD; labs- cbc/cmp;Trodelvy- IVFs 1 lit-  # Follow up in 2 weeks- labs- cbc/bmp; NO chemo; IVFs 1 lit  # follow up in 3 weeks- MD; labs- cbc/cmp;Trodelvy- IVFs 1 lit-Dr.B    All questions were answered. The patient knows to call the clinic with any problems, questions or concerns.   Cammie Sickle, MD 02/01/2021 7:13 PM

## 2021-02-01 NOTE — Assessment & Plan Note (Addendum)
#  Locally recurrent recurrent/stage IV -left breast cancer T4 N2 M1 [subcutaneous nodules].  Currently undergoing palliative RT [08/04- 9/08].  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   #Patient s/p palliative radiation on 09/08.  Proceed with Trodelvy-cycle number 1 day 1 today. Labs today reviewed;  acceptable for treatment today-except for low sodium 129.  Again reviewed the potential side effects including but not limited to neutropenia/diarrhea.  Prescribed Lomotil.  # Electrolytes abnormalities: chronic hyponatremia-likely secondary to SIDH/psychiatric medication-stable.  Sodium today 129 stable; discussed re: free water restriction;  Potassium 3.1; continue 20 meq twice daily at home.  # schizophrenia: STABLE; Tritnity [phone]; Dr.Ahluwalia].  Itching-hydroxyzine prescribed.  #Prognosis : Unfortunately very poor-mother understands patient disease is incurable. s/p palliative care evaluation. DNR/DNI-  # DISPOSITION:  # Chemo today; IVFs 1 lit today  # follow up in 1 weeks- MD; labs- cbc/cmp;Trodelvy- IVFs 1 lit-  # Follow up in 2 weeks- labs- cbc/bmp; NO chemo; IVFs 1 lit  # follow up in 3 weeks- MD; labs- cbc/cmp;Trodelvy- IVFs 1 lit-Dr.B

## 2021-02-04 ENCOUNTER — Other Ambulatory Visit: Payer: Self-pay | Admitting: Internal Medicine

## 2021-02-04 ENCOUNTER — Ambulatory Visit: Payer: Medicaid Other

## 2021-02-04 ENCOUNTER — Encounter: Payer: Self-pay | Admitting: Internal Medicine

## 2021-02-05 ENCOUNTER — Ambulatory Visit: Payer: Medicaid Other

## 2021-02-05 ENCOUNTER — Other Ambulatory Visit: Payer: Self-pay | Admitting: *Deleted

## 2021-02-05 ENCOUNTER — Telehealth: Payer: Self-pay | Admitting: *Deleted

## 2021-02-05 NOTE — Telephone Encounter (Signed)
Spoke with Wilhemena Durie, RN in radiation. Pt has silvadine cream script available- sent in by Dr. Sherlene Shams a few weeks ago.  Spoke with patient's sister [name]. Pt has Silvadine cream available to treat breast radiation site. RN will consult with Beckey Rutter, NP to see if any other treatment options for necrotic wound break down. Also will inquire if wound care consult would be appropriate.

## 2021-02-05 NOTE — Telephone Encounter (Signed)
Sister [name] called asking if patient is to be putting something on her scars. Please advise

## 2021-02-06 ENCOUNTER — Ambulatory Visit: Payer: Medicaid Other

## 2021-02-06 ENCOUNTER — Encounter: Payer: Self-pay | Admitting: Internal Medicine

## 2021-02-06 ENCOUNTER — Encounter: Payer: Self-pay | Admitting: *Deleted

## 2021-02-06 ENCOUNTER — Other Ambulatory Visit: Payer: Self-pay | Admitting: *Deleted

## 2021-02-06 DIAGNOSIS — C50812 Malignant neoplasm of overlapping sites of left female breast: Secondary | ICD-10-CM

## 2021-02-06 NOTE — Telephone Encounter (Signed)
Mychart msg sent to [name]- with NP's recommendations.

## 2021-02-07 ENCOUNTER — Other Ambulatory Visit: Payer: Self-pay | Admitting: Internal Medicine

## 2021-02-07 ENCOUNTER — Ambulatory Visit: Payer: Medicaid Other

## 2021-02-08 ENCOUNTER — Inpatient Hospital Stay: Payer: Medicaid Other

## 2021-02-08 ENCOUNTER — Encounter: Payer: Self-pay | Admitting: Internal Medicine

## 2021-02-08 ENCOUNTER — Other Ambulatory Visit: Payer: Self-pay

## 2021-02-08 ENCOUNTER — Ambulatory Visit: Payer: Medicaid Other

## 2021-02-08 ENCOUNTER — Inpatient Hospital Stay (HOSPITAL_BASED_OUTPATIENT_CLINIC_OR_DEPARTMENT_OTHER): Payer: Medicaid Other | Admitting: Internal Medicine

## 2021-02-08 VITALS — BP 118/70 | HR 87 | Resp 18

## 2021-02-08 DIAGNOSIS — Z95828 Presence of other vascular implants and grafts: Secondary | ICD-10-CM

## 2021-02-08 DIAGNOSIS — Z5111 Encounter for antineoplastic chemotherapy: Secondary | ICD-10-CM | POA: Diagnosis not present

## 2021-02-08 DIAGNOSIS — Z171 Estrogen receptor negative status [ER-]: Secondary | ICD-10-CM

## 2021-02-08 DIAGNOSIS — C50812 Malignant neoplasm of overlapping sites of left female breast: Secondary | ICD-10-CM

## 2021-02-08 DIAGNOSIS — Z7189 Other specified counseling: Secondary | ICD-10-CM

## 2021-02-08 LAB — COMPREHENSIVE METABOLIC PANEL
ALT: 11 U/L (ref 0–44)
AST: 13 U/L — ABNORMAL LOW (ref 15–41)
Albumin: 3.3 g/dL — ABNORMAL LOW (ref 3.5–5.0)
Alkaline Phosphatase: 74 U/L (ref 38–126)
Anion gap: 13 (ref 5–15)
BUN: <5 mg/dL — ABNORMAL LOW (ref 6–20)
CO2: 25 mmol/L (ref 22–32)
Calcium: 9 mg/dL (ref 8.9–10.3)
Chloride: 89 mmol/L — ABNORMAL LOW (ref 98–111)
Creatinine, Ser: 0.5 mg/dL (ref 0.44–1.00)
GFR, Estimated: >60 mL/min (ref >60)
Glucose, Bld: 98 mg/dL (ref 70–99)
Potassium: 3.3 mmol/L — ABNORMAL LOW (ref 3.5–5.1)
Sodium: 127 mmol/L — ABNORMAL LOW (ref 135–145)
Total Bilirubin: 0.8 mg/dL (ref 0.3–1.2)
Total Protein: 7.3 g/dL (ref 6.5–8.1)

## 2021-02-08 LAB — CBC WITH DIFFERENTIAL/PLATELET
Abs Immature Granulocytes: 0.01 10*3/uL (ref 0.00–0.07)
Basophils Absolute: 0 10*3/uL (ref 0.0–0.1)
Basophils Relative: 2 %
Eosinophils Absolute: 0.1 10*3/uL (ref 0.0–0.5)
Eosinophils Relative: 5 %
HCT: 24.7 % — ABNORMAL LOW (ref 36.0–46.0)
Hemoglobin: 8.7 g/dL — ABNORMAL LOW (ref 12.0–15.0)
Immature Granulocytes: 1 %
Lymphocytes Relative: 11 %
Lymphs Abs: 0.2 10*3/uL — ABNORMAL LOW (ref 0.7–4.0)
MCH: 31 pg (ref 26.0–34.0)
MCHC: 35.2 g/dL (ref 30.0–36.0)
MCV: 87.9 fL (ref 80.0–100.0)
Monocytes Absolute: 0.2 10*3/uL (ref 0.1–1.0)
Monocytes Relative: 11 %
Neutro Abs: 1.1 10*3/uL — ABNORMAL LOW (ref 1.7–7.7)
Neutrophils Relative %: 70 %
Platelets: 93 10*3/uL — ABNORMAL LOW (ref 150–400)
RBC: 2.81 MIL/uL — ABNORMAL LOW (ref 3.87–5.11)
RDW: 14.3 % (ref 11.5–15.5)
Smear Review: NORMAL
WBC Morphology: INCREASED
WBC: 1.6 10*3/uL — ABNORMAL LOW (ref 4.0–10.5)
nRBC: 0 % (ref 0.0–0.2)

## 2021-02-08 MED ORDER — SODIUM CHLORIDE 0.9 % IV SOLN
Freq: Once | INTRAVENOUS | Status: AC
  Administered 2021-02-08: 1000 mL via INTRAVENOUS
  Filled 2021-02-08: qty 250

## 2021-02-08 MED ORDER — DIPHENHYDRAMINE HCL 50 MG/ML IJ SOLN
50.0000 mg | Freq: Once | INTRAMUSCULAR | Status: AC
  Administered 2021-02-08: 50 mg via INTRAVENOUS
  Filled 2021-02-08: qty 1

## 2021-02-08 MED ORDER — HEPARIN SOD (PORK) LOCK FLUSH 100 UNIT/ML IV SOLN
500.0000 [IU] | Freq: Once | INTRAVENOUS | Status: AC
Start: 2021-02-08 — End: 2021-02-08
  Filled 2021-02-08: qty 5

## 2021-02-08 MED ORDER — PALONOSETRON HCL INJECTION 0.25 MG/5ML
0.2500 mg | Freq: Once | INTRAVENOUS | Status: AC
  Administered 2021-02-08: 0.25 mg via INTRAVENOUS
  Filled 2021-02-08: qty 5

## 2021-02-08 MED ORDER — ACETAMINOPHEN 325 MG PO TABS
650.0000 mg | ORAL_TABLET | Freq: Once | ORAL | Status: AC
  Administered 2021-02-08: 650 mg via ORAL
  Filled 2021-02-08: qty 2

## 2021-02-08 MED ORDER — SODIUM CHLORIDE 0.9 % IV SOLN
Freq: Once | INTRAVENOUS | Status: AC
  Filled 2021-02-08: qty 250

## 2021-02-08 MED ORDER — SODIUM CHLORIDE 0.9 % IV SOLN
10.0000 mg | Freq: Once | INTRAVENOUS | Status: AC
  Administered 2021-02-08: 10 mg via INTRAVENOUS
  Filled 2021-02-08: qty 10

## 2021-02-08 MED ORDER — FAMOTIDINE 20 MG IN NS 100 ML IVPB
20.0000 mg | Freq: Once | INTRAVENOUS | Status: AC
  Administered 2021-02-08: 20 mg via INTRAVENOUS
  Filled 2021-02-08: qty 20

## 2021-02-08 MED ORDER — SODIUM CHLORIDE 0.9 % IV SOLN
8.0000 mg/kg | Freq: Once | INTRAVENOUS | Status: AC
  Administered 2021-02-08: 540 mg via INTRAVENOUS
  Filled 2021-02-08: qty 54

## 2021-02-08 MED ORDER — HEPARIN SOD (PORK) LOCK FLUSH 100 UNIT/ML IV SOLN
INTRAVENOUS | Status: AC
  Administered 2021-02-08: 500 [IU] via INTRAVENOUS
  Filled 2021-02-08: qty 5

## 2021-02-08 MED ORDER — SODIUM CHLORIDE 0.9% FLUSH
10.0000 mL | Freq: Once | INTRAVENOUS | Status: AC
  Administered 2021-02-08: 10 mL via INTRAVENOUS
  Filled 2021-02-08: qty 10

## 2021-02-08 MED ORDER — SODIUM CHLORIDE 0.9 % IV SOLN
150.0000 mg | Freq: Once | INTRAVENOUS | Status: AC
  Administered 2021-02-08: 150 mg via INTRAVENOUS
  Filled 2021-02-08: qty 150

## 2021-02-08 MED ORDER — ATROPINE SULFATE 1 MG/ML IJ SOLN
0.5000 mg | Freq: Once | INTRAMUSCULAR | Status: AC | PRN
  Administered 2021-02-08: 0.5 mg via INTRAVENOUS
  Filled 2021-02-08: qty 1

## 2021-02-08 NOTE — Progress Notes (Signed)
HU:[id number]- Platelets: 93,000. MD, Dr. Rogue Bussing, notified and aware. Per MD order: proceed with scheduled Trodelvy treatment and IVF today.

## 2021-02-08 NOTE — Assessment & Plan Note (Addendum)
#   Stage IV -left breast cancer T4 N2 M1 [subcutaneous nodules].  Positive for contralateral right breast inflammatory/malignancy status postbiopsy.  The patient currently on Trodelvy-  #  Proceed with chemo cycle #1- day-8; Labs today reviewed;  acceptable for treatment today- except for ANC- 1.1' platlets-93; will dose reduced the chemo-20% dose reduction.   # Electrolytes abnormalities: chronic hyponatremia-likely secondary to SIDH/psychiatric medication-stable.  Sodium today 127-STABLE;  discussed re: free water restriction;  Potassium 3.1; continue 20 meq twice daily at home.  # schizophrenia: STABLE; Tritnity [phone]; Dr.Ahluwalia].  Itching-hydroxyzine prescribed.  #Pain control: Currently stable on hydrocodone 1 to 2 pills a day.  #Prognosis : Unfortunately very poor-mother understands patient disease is incurable. s/p palliative care evaluation. DNR/DNI-  # DISPOSITION:  # Chemo today; IVFs 1 lit today  # on 9/19- udenyca.   # Follow up as planned in 1 week- labs- cbc/bmp; NO chemo; IVFs 1 lit  # As planned follow up in 2 weeks- MD; labs- cbc/cmp;Trodelvy- IVFs 1 lit-Dr.B  #  Follow up 3 week- labs- cbc/cmp; Ivette Loyal- IVFs 1 lit; D-3 udenyca-Dr.B

## 2021-02-08 NOTE — Progress Notes (Signed)
[name] CONSULT NOTE  Patient Care Team: Physicians, [place] as PCP - General Vladimir Crofts, MD as Consulting Physician (Neurology) Cammie Sickle, MD as Consulting Physician (Internal Medicine) Randel Pigg, MD as Consulting Physician (Psychiatry) Herbert Pun, MD as Consulting Physician (General Surgery)  CHIEF COMPLAINTS/PURPOSE OF CONSULTATION: Breast cancer  #  Oncology History Overview Note  # NOV 2021- LEFT BREAST CANCER - [place]; TRIPLE NEGATIVE; Grade of invasive carcinoma: Grade 3; # LYMPH NODE, LEFT AXILLA; [Dr.Cintron]; ULTRASOUND-GUIDED CORE BIOPSY:  - METASTATIC MAMMARY CARCINOMA, AT LEAST 9 MM METASTASIS. ]; DEC 9th PET scan-Intensely hypermetabolic 09.2 cm left breast mass compatible with primary left breast malignancy, with left pectoralis muscle involvement posteriorly and cutaneous involvement by the mass. Multiple smaller hypermetabolic satellite malignant nodules scattered throughout the left breast. Bulky hypermetabolic left axillary and left retropectoral nodal metastases.   # 12/10- Carboplatin ONLY;  Taxol hypersensitivity reaction-discontinued  # 12/17- AC #1 q 3 W x4; MARCH 28th- CARBO-ABRAXANE-KEYTRUDA; #cycle 1-8 [abraxane dose reduced to 75/m2- sec to diarrhea-3]  DIAGNOSIS:  A. LEFT BREAST; RADICAL MASTECTOMY:  - INVASIVE MAMMARY CARCINOMA.  - METASTATIC CARCINOMA INVOLVING ONE OF TWELVE LYMPH NODES (1/12).  - SEE CANCER SUMMARY BELOW.   B. AXILLARY TISSUE, LEFT; RESECTION:  - TWO LYMPH NODES, NEGATIVE FOR METASTATIC CARCINOMA (0/2).   CANCER CASE SUMMARY: INVASIVE CARCINOMA OF THE BREAST  Standard(s): AJCC-UICC 8   SPECIMEN  Procedure: Radical mastectomy  Specimen Laterality: Left   TUMOR  Histologic Type: Invasive mammary carcinoma of no special type  Histologic Grade (Nottingham Histologic Score)                       Glandular (Acinar)/Tubular Differentiation: 3                       Nuclear  Pleomorphism: 3                       Mitotic Rate: 3                       Overall Grade: 3  Tumor Size: 82 x 77 x 68 mm, separate 2 mm focus within randomly sampled  upper inner quadrant  Ductal Carcinoma In Situ (DCIS): Not identified  Lymphovascular Invasion: Present  Treatment Effect in the Breast: Treatment Effect in the Breast: No  definite response to presurgical therapy in the invasive carcinoma  Treatment Effect in the Lymph Nodes: Probable or definite response to  presurgical therapy in metastatic carcinoma  Residual Cancer Burden (RCB) from [facility] calculator (website  below):       Primary Tumor Bed            Primary tumor bed: 82 mm x 77 mm            Overall cancer cellularity: 100%            Percentage of cancer that is in situ: 0%       Lymph nodes            Number of lymph nodes positive for metastasis: 1            Diameter of largest metastasis: 8 mm       Residual Cancer Burden: 4.37       Residual Cancer Burden Class: RCB-III   [email or web address]    MARGINS  Margin Status for Invasive Carcinoma: All margins  negative for invasive  carcinoma                       Distance from closest margin: 3 mm                       Specify closest margin: Posterior   Margin Status for DCIS: Not applicable   REGIONAL LYMPH NODES  Regional Lymph Node Status: Tumor present in regional lymph node(s)                       Number of Lymph Nodes with Macrometastases (greater  than 2 mm): 1                       Number of Lymph Nodes with Micrometastases (greater  than 0.2 mm to 2 mm and/or greater than 200 cells): 0                       Number of Lymph Nodes with Isolated Tumor Cells  (0.2 mm or less OR 200 cells or less): 0                       Size of Largest Metastatic Deposit: 8 mm                       Extranodal Extension: Not identified                       Total Number of Lymph Nodes Examined (sentinel and   non-sentinel): 14                       Number of Sentinel Nodes Examined: 0  DISTANT METASTASIS  Distant Site(s) Involved, if applicable: Not applicable   PATHOLOGIC STAGE CLASSIFICATION (pTNM, AJCC 8th Edition):  TNM Descriptors: M (multifocal)  pT3  Regional Lymph Nodes Modifier: Not applicable  pN 1a  pM - Not applicable   # NOV 3154-MGQ6 (by immunohistochemistry): NEGATIVE (Score 0); Foundation 1: PD-L1 negative; no targets** RAD 51  # SURVIVORSHIP:   # GENETICS: p  DIAGNOSIS: Triple negative breast cancer  STAGE: Stage IV     ;  GOALS: palliative  CURRENT/MOST RECENT THERAPY : RT    Carcinoma of overlapping sites of left breast in female, estrogen receptor negative ([place])  04/24/2020 Initial Diagnosis   Carcinoma of overlapping sites of left breast in female, estrogen receptor negative ([place])   05/04/2020 - 05/04/2020 Chemotherapy   The patient had dexamethasone (DECADRON) 4 MG tablet, 8 mg, Oral, Daily, 1 of 1 cycle, Start date: --, End date: -- palonosetron (ALOXI) injection 0.25 mg, 0.25 mg, Intravenous,  Once, 1 of 12 cycles Administration: 0.25 mg (05/04/2020) CARBOplatin (PARAPLATIN) 220 mg in sodium chloride 0.9 % 250 mL chemo infusion, 220 mg (100 % of original dose 222.4 mg), Intravenous,  Once, 1 of 12 cycles Dose modification:   (original dose 222.4 mg, Cycle 1) Administration: 220 mg (05/04/2020) PACLitaxel (TAXOL) 132 mg in sodium chloride 0.9 % 250 mL chemo infusion (</= 53m/m2), 80 mg/m2 = 132 mg, Intravenous,  Once, 1 of 12 cycles Administration: 132 mg (05/04/2020)   for chemotherapy treatment.     05/11/2020 - 07/31/2020 Chemotherapy          06/14/2020 Cancer Staging   Staging form: Breast, AJCC 8th Edition -  Clinical: Stage IIIC (cT4, cN2b, cM0, G3, ER-, PR-, HER2-) - Signed by Cammie Sickle, MD on 06/14/2020   08/20/2020 - 09/26/2020 Chemotherapy          02/01/2021 -  Chemotherapy    Patient is on Treatment Plan: BREAST METASTATIC  SACITUZUMAB GOVITECAN-HZIY ([place]) Q21D         HISTORY OF PRESENTING ILLNESS: Patient is accompanied by her mother/patient is poor historian.  Tamara [name] 50 y.o.  female with schizophrenia and left locally advanced/metastatic triple negative breast cancer currently on palliative chemotherapy with Ivette Loyal is here for follow-up.  Patient received cycle number 1 day 1 approximately 1 week ago.   Patient tolerated chemotherapy fairly well.  No diarrhea nausea vomiting.  Appetite is poor.  Lost weight.  Pain is well controlled on hydrocodone pain medication.  Review of Systems  Unable to perform ROS: Psychiatric disorder    MEDICAL HISTORY:  Past Medical History:  Diagnosis Date   Breast cancer ([place]) 03/2020   left   Family history of breast cancer    Family history of stomach cancer    Family history of stomach cancer    GERD (gastroesophageal reflux disease)    Hypothyroidism    IUD (intrauterine device) in place IUD was placed in 2016   Leiomyoma of uterus    Schizophrenia ([place])    Thyroid disease     SURGICAL HISTORY: Past Surgical History:  Procedure Laterality Date   ADJACENT TISSUE TRANSFER/TISSUE REARRANGEMENT N/A 10/29/2020   Procedure: POSSIBLE ADJACENT TISSUE TRANSFER, TISSUE REARRANGEMENT;  Surgeon: Wallace Going, DO;  Location: [facility] ORS;  Service: Plastics;  Laterality: N/A;   APPLICATION OF A-CELL OF CHEST/ABDOMEN N/A 10/29/2020   Procedure: POSSIBLE APPLICATION OF A-CELL OF CHEST/ABDOMEN;  Surgeon: Wallace Going, DO;  Location: [facility] ORS;  Service: Plastics;  Laterality: N/A;   APPLICATION OF WOUND VAC N/A 10/29/2020   Procedure: POSSIBLE APPLICATION OF WOUND VAC;  Surgeon: Wallace Going, DO;  Location: [facility] ORS;  Service: Plastics;  Laterality: N/A;   BREAST BIOPSY Left 03/2020   [facility]- Triple neg- Stage 3   PORTACATH PLACEMENT Right 05/02/2020   Procedure: INSERTION PORT-A-CATH;  Surgeon: Herbert Pun, MD;  Location: [facility] ORS;   Service: General;  Laterality: Right;   TOTAL MASTECTOMY Left 10/29/2020   Procedure: TOTAL MASTECTOMY --- RADICAL;  Surgeon: Herbert Pun, MD;  Location: [facility] ORS;  Service: General;  Laterality: Left;  needs 3 hrs    SOCIAL HISTORY: Social History   Socioeconomic History   Marital status: Single    Spouse name: Not on file   Number of children: Not on file   Years of education: Not on file   Highest education level: Not on file  Occupational History   Not on file  Tobacco Use   Smoking status: Never   Smokeless tobacco: Never  Vaping Use   Vaping Use: Never used  Substance and Sexual Activity   Alcohol use: No   Drug use: No   Sexual activity: Yes    Partners: Male    Birth control/protection: Surgical, Injection  Other Topics Concern   Not on file  Social History Narrative   Not on file   Social Determinants of Health   Financial Resource Strain: Not on file  Food Insecurity: Not on file  Transportation Needs: Not on file  Physical Activity: Not on file  Stress: Not on file  Social Connections: Not on file  Intimate Partner Violence: Not on file  FAMILY HISTORY: Family History  Problem Relation Age of Onset   Breast cancer Mother    Stomach cancer Maternal Grandfather    Other Maternal Aunt        died from tumor, brain?    ALLERGIES:  is allergic to paclitaxel.  MEDICATIONS:  Current Outpatient Medications  Medication Sig Dispense Refill   benztropine (COGENTIN) 0.5 MG tablet Take 1 tablet (0.5 mg total) by mouth 2 (two) times daily. 60 tablet 1   cetirizine (ZYRTEC) 10 MG tablet Take 1 tablet (10 mg total) by mouth daily as needed for allergies.     clonazePAM (KLONOPIN) 0.5 MG tablet Take 0.5 mg by mouth at bedtime.     diphenoxylate-atropine (LOMOTIL) 2.5-0.025 MG tablet Take 1 tablet by mouth 4 (four) times daily as needed for diarrhea or loose stools. Take it along with immodium 60 tablet 0   divalproex (DEPAKOTE) 500 MG DR tablet Take 1  tablet (500 mg total) by mouth every 12 (twelve) hours. 60 tablet 1   fluticasone (FLONASE) 50 MCG/ACT nasal spray Place 1 spray into both nostrils daily. (Patient taking differently: Place 1 spray into both nostrils daily as needed for allergies.) 11.1 mL 9   haloperidol (HALDOL) 5 MG tablet Take 1 tablet (5 mg total) by mouth 3 (three) times daily. 90 tablet 1   haloperidol decanoate (HALDOL DECANOATE) 100 MG/ML injection Inject 100 mg into the muscle every 28 (twenty-eight) days.     HYDROcodone-acetaminophen (NORCO/VICODIN) 5-325 MG tablet Take 1 tablet by mouth every 12 (twelve) hours as needed for moderate pain. 1 pill every 12 hours as needed 45 tablet 0   hydrOXYzine (ATARAX/VISTARIL) 25 MG tablet Take 1 tablet (25 mg total) by mouth every 8 (eight) hours as needed. 90 tablet 3   KLOR-CON M20 20 MEQ tablet TAKE 1 TABLET BY MOUTH TWICE A DAY 60 tablet 0   lidocaine-prilocaine (EMLA) cream Apply 1 application topically as needed. 30 g 3   medroxyPROGESTERone (DEPO-PROVERA) 150 MG/ML injection Inject 150 mg into the muscle every 3 (three) months.     MELATONIN PO Take 1 tablet by mouth at bedtime as needed (sleep).     ondansetron (ZOFRAN) 8 MG tablet Take 1 tablet (8 mg total) by mouth every 8 (eight) hours as needed for nausea or vomiting. 30 tablet 0   prochlorperazine (COMPAZINE) 10 MG tablet TAKE 1 TABLET BY MOUTH EVERY 6 HOURS AS NEEDED FOR NAUSEA OR VOMITING. 40 tablet 1   silver sulfADIAZINE (SILVADENE) 1 % cream Apply 1 application topically daily. 50 g 1   sodium chloride 1 g tablet Take 1 tablet (1 g total) by mouth 3 (three) times daily. 90 tablet 0   levothyroxine (SYNTHROID) 88 MCG tablet TAKE 1 TABLET (88 MCG TOTAL) BY MOUTH DAILY AT 6 (SIX) AM. 30 tablet 3   No current facility-administered medications for this visit.   Facility-Administered Medications Ordered in Other Visits  Medication Dose Route Frequency Provider Last Rate Last Admin   sodium chloride flush (NS) 0.9 %  injection 10 mL  10 mL Intravenous PRN ,  R, MD   10 mL at 06/14/20 0840      .  PHYSICAL EXAMINATION: ECOG PERFORMANCE STATUS: 1 - Symptomatic but completely ambulatory  Vitals:   02/08/21 0832  BP: 118/71  Pulse: (!) 101  Resp: 20  Temp: 97.6 F (36.4 C)   Filed Weights   02/08/21 0832  Weight: 150 lb (68 kg)    Physical Exam Constitutional:        Comments: Accompanied by mother.  Ambulating independently.  HENT:     Head: Normocephalic and atraumatic.     Mouth/Throat:     Pharynx: No oropharyngeal exudate.  Eyes:     Pupils: Pupils are equal, round, and reactive to light.  Cardiovascular:     Rate and Rhythm: Normal rate and regular rhythm.  Pulmonary:     Effort: No respiratory distress.     Breath sounds: No wheezing.  Abdominal:     General: Bowel sounds are normal. There is no distension.     Palpations: Abdomen is soft. There is no mass.     Tenderness: There is no abdominal tenderness. There is no guarding or rebound.  Musculoskeletal:        General: No tenderness. Normal range of motion.     Cervical back: Normal range of motion and neck supple.  Skin:    General: Skin is warm.     Comments: Left mastectomy site- significant local progression noted/see images below.  Contralateral right breast-erythema noted warmth noted.  Mild tenderness noted.  No lymphadenopathy in the right axilla  Neurological:     Mental Status: She is alert and oriented to person, place, and time.  Psychiatric:        Mood and Affect: Affect normal.     Comments: Poor insight/tangential thoughts.  Unable to make a coherent conversation-underlying schizophrenia.     LABORATORY DATA:  I have reviewed the data as listed Lab Results  Component Value Date   WBC 1.6 (L) 02/08/2021   HGB 8.7 (L) 02/08/2021   HCT 24.7 (L) 02/08/2021   MCV 87.9 02/08/2021   PLT 93 (L) 02/08/2021   Recent Labs    01/25/21 0849 02/01/21 0758 02/08/21 0823  NA 123* 129*  127*  K 3.5 3.5 3.3*  CL 88* 92* 89*  CO2 25 26 25  GLUCOSE 96 92 98  BUN <5* <5* <5*  CREATININE 0.60 0.54 0.50  CALCIUM 9.0 9.3 9.0  GFRNONAA >60 >60 >60  PROT 6.8 6.8 7.3  ALBUMIN 3.3* 3.1* 3.3*  AST 12* 14* 13*  ALT 6 7 11  ALKPHOS 49 54 74  BILITOT 0.5 0.6 0.8    RADIOGRAPHIC STUDIES: I have personally reviewed the radiological images as listed and agreed with the findings in the report. No results found.     06/04/2020-     07/30/2020-    On 4/26-    09/26/2020    10/02/2020   5/25   12/04/2020   12/21/2020   On 8/14- while on RT    08/26      Contralateral right breast 8/26.    02/01/2021-     9/16      ASSESSMENT & PLAN:   Carcinoma of overlapping sites of left breast in female, estrogen receptor negative (HCC) # Stage IV -left breast cancer T4 N2 M1 [subcutaneous nodules].  Positive for contralateral right breast inflammatory/malignancy status postbiopsy.  The patient currently on Trodelvy-  #  Proceed with chemo cycle #1- day-8; Labs today reviewed;  acceptable for treatment today- except for ANC- 1.1' platlets-93; will dose reduced the chemo-20% dose reduction.   # Electrolytes abnormalities: chronic hyponatremia-likely secondary to SIDH/psychiatric medication-stable.  Sodium today 127-STABLE;  discussed re: free water restriction;  Potassium 3.1; continue 20 meq twice daily at home.  # schizophrenia: STABLE; Tritnity [[phone]; Dr.Ahluwalia].  Itching-hydroxyzine prescribed.  #Pain control: Currently stable on hydrocodone 1 to 2 pills a day.  #Prognosis : Unfortunately very poor-mother   understands patient disease is incurable. s/p palliative care evaluation. DNR/DNI-  # DISPOSITION:  # Chemo today; IVFs 1 lit today  # on 9/19- udenyca.   # Follow up as planned in 1 week- labs- cbc/bmp; NO chemo; IVFs 1 lit  # As planned follow up in 2 weeks- MD; labs- cbc/cmp;Trodelvy- IVFs 1 lit-Dr.B  #  Follow up 3 week-  labs- cbc/cmp; Trodelvy- IVFs 1 lit; D-3 udenyca-Dr.B    All questions were answered. The patient knows to call the clinic with any problems, questions or concerns.    R , MD 02/09/2021 12:43 AM    

## 2021-02-08 NOTE — Patient Instructions (Signed)
[facility]   Discharge Instructions: Thank you for choosing [facility] to provide your oncology and hematology care.  If you have a lab appointment with the [facility], please go directly to the [facility] and check in at the registration area.  Wear comfortable clothing and clothing appropriate for easy access to any Portacath or PICC line.   We strive to give you quality time with your provider. You may need to reschedule your appointment if you arrive late (15 or more minutes).  Arriving late affects you and other patients whose appointments are after yours.  Also, if you miss three or more appointments without notifying the office, you may be dismissed from the clinic at the provider's discretion.      For prescription refill requests, have your pharmacy contact our office and allow 72 hours for refills to be completed.    Today you received the following chemotherapy and/or immunotherapy agents: Trodelvy.      To help prevent nausea and vomiting after your treatment, we encourage you to take your nausea medication as directed.  BELOW ARE SYMPTOMS THAT SHOULD BE REPORTED IMMEDIATELY: *FEVER GREATER THAN 100.4 F (38 C) OR HIGHER *CHILLS OR SWEATING *NAUSEA AND VOMITING THAT IS NOT CONTROLLED WITH YOUR NAUSEA MEDICATION *UNUSUAL SHORTNESS OF BREATH *UNUSUAL BRUISING OR BLEEDING *URINARY PROBLEMS (pain or burning when urinating, or frequent urination) *BOWEL PROBLEMS (unusual diarrhea, constipation, pain near the anus) TENDERNESS IN MOUTH AND THROAT WITH OR WITHOUT PRESENCE OF ULCERS (sore throat, sores in mouth, or a toothache) UNUSUAL RASH, SWELLING OR PAIN  UNUSUAL VAGINAL DISCHARGE OR ITCHING   Items with * indicate a potential emergency and should be followed up as soon as possible or go to the Emergency Department if any problems should occur.  Please show the CHEMOTHERAPY ALERT CARD or IMMUNOTHERAPY ALERT CARD at  check-in to the Emergency Department and triage nurse.  Should you have questions after your visit or need to cancel or reschedule your appointment, please contact [facility]  [phone] and follow the prompts.  Office hours are 8:00 a.m. to 4:30 p.m. Monday - Friday. Please note that voicemails left after 4:00 p.m. may not be returned until the following business day.  We are closed weekends and major holidays. You have access to a nurse at all times for urgent questions. Please call the main number to the clinic [phone] and follow the prompts.  For any non-urgent questions, you may also contact your provider using MyChart. We now offer e-Visits for anyone 20 and older to request care online for non-urgent symptoms. For details visit [email or web address].   Also download the MyChart app! Go to the app store, search "MyChart", open the app, select [facility], and log in with your MyChart username and password.  Due to Covid, a mask is required upon entering the hospital/clinic. If you do not have a mask, one will be given to you upon arrival. For doctor visits, patients may have 1 support person aged 49 or older with them. For treatment visits, patients cannot have anyone with them due to current Covid guidelines and our immunocompromised population.

## 2021-02-09 ENCOUNTER — Encounter: Payer: Self-pay | Admitting: Internal Medicine

## 2021-02-11 ENCOUNTER — Ambulatory Visit: Payer: Medicaid Other

## 2021-02-11 ENCOUNTER — Inpatient Hospital Stay: Payer: Medicaid Other

## 2021-02-11 DIAGNOSIS — Z5111 Encounter for antineoplastic chemotherapy: Secondary | ICD-10-CM | POA: Diagnosis not present

## 2021-02-11 DIAGNOSIS — Z7189 Other specified counseling: Secondary | ICD-10-CM

## 2021-02-11 DIAGNOSIS — C50812 Malignant neoplasm of overlapping sites of left female breast: Secondary | ICD-10-CM

## 2021-02-11 DIAGNOSIS — Z171 Estrogen receptor negative status [ER-]: Secondary | ICD-10-CM

## 2021-02-11 LAB — SURGICAL PATHOLOGY

## 2021-02-11 MED ORDER — PEGFILGRASTIM-CBQV 6 MG/0.6ML ~~LOC~~ SOSY
6.0000 mg | PREFILLED_SYRINGE | Freq: Once | SUBCUTANEOUS | Status: AC
  Administered 2021-02-11: 6 mg via SUBCUTANEOUS
  Filled 2021-02-11: qty 0.6

## 2021-02-12 ENCOUNTER — Ambulatory Visit: Payer: Medicaid Other

## 2021-02-12 ENCOUNTER — Other Ambulatory Visit: Payer: Self-pay | Admitting: Pathology

## 2021-02-13 ENCOUNTER — Inpatient Hospital Stay: Payer: Medicaid Other

## 2021-02-13 ENCOUNTER — Encounter: Payer: Self-pay | Admitting: Radiation Oncology

## 2021-02-13 ENCOUNTER — Inpatient Hospital Stay
Admission: EM | Admit: 2021-02-13 | Discharge: 2021-02-15 | DRG: 871 | Disposition: A | Discharge status: Hospice | Payer: Medicaid Other | Source: Ambulatory Visit | Attending: Internal Medicine | Admitting: Internal Medicine

## 2021-02-13 ENCOUNTER — Emergency Department: Payer: Medicaid Other

## 2021-02-13 ENCOUNTER — Inpatient Hospital Stay (HOSPITAL_BASED_OUTPATIENT_CLINIC_OR_DEPARTMENT_OTHER): Payer: Medicaid Other | Admitting: Hospice and Palliative Medicine

## 2021-02-13 ENCOUNTER — Other Ambulatory Visit: Payer: Self-pay

## 2021-02-13 ENCOUNTER — Ambulatory Visit
Admission: RE | Admit: 2021-02-13 | Discharge: 2021-02-13 | Disposition: A | Discharge status: Home / Self Care | Payer: Medicaid Other | Source: Ambulatory Visit | Attending: Radiation Oncology | Admitting: Radiation Oncology

## 2021-02-13 ENCOUNTER — Ambulatory Visit: Payer: Medicaid Other

## 2021-02-13 ENCOUNTER — Encounter: Payer: Self-pay | Admitting: Emergency Medicine

## 2021-02-13 VITALS — BP 91/57 | HR 126 | Temp 97.6°F | Resp 16 | Wt 144.3 lb

## 2021-02-13 VITALS — BP 88/50 | HR 114 | Temp 98.7°F | Resp 18

## 2021-02-13 DIAGNOSIS — A419 Sepsis, unspecified organism: Secondary | ICD-10-CM | POA: Diagnosis present

## 2021-02-13 DIAGNOSIS — F200 Paranoid schizophrenia: Secondary | ICD-10-CM | POA: Diagnosis present

## 2021-02-13 DIAGNOSIS — D6481 Anemia due to antineoplastic chemotherapy: Secondary | ICD-10-CM | POA: Diagnosis present

## 2021-02-13 DIAGNOSIS — C50812 Malignant neoplasm of overlapping sites of left female breast: Secondary | ICD-10-CM

## 2021-02-13 DIAGNOSIS — D703 Neutropenia due to infection: Secondary | ICD-10-CM

## 2021-02-13 DIAGNOSIS — E860 Dehydration: Secondary | ICD-10-CM | POA: Diagnosis present

## 2021-02-13 DIAGNOSIS — N179 Acute kidney failure, unspecified: Secondary | ICD-10-CM | POA: Diagnosis not present

## 2021-02-13 DIAGNOSIS — R87610 Atypical squamous cells of undetermined significance on cytologic smear of cervix (ASC-US): Secondary | ICD-10-CM | POA: Diagnosis present

## 2021-02-13 DIAGNOSIS — F319 Bipolar disorder, unspecified: Secondary | ICD-10-CM | POA: Diagnosis not present

## 2021-02-13 DIAGNOSIS — Z171 Estrogen receptor negative status [ER-]: Secondary | ICD-10-CM

## 2021-02-13 DIAGNOSIS — T451X5A Adverse effect of antineoplastic and immunosuppressive drugs, initial encounter: Secondary | ICD-10-CM | POA: Diagnosis present

## 2021-02-13 DIAGNOSIS — Z9189 Other specified personal risk factors, not elsewhere classified: Secondary | ICD-10-CM

## 2021-02-13 DIAGNOSIS — C50919 Malignant neoplasm of unspecified site of unspecified female breast: Secondary | ICD-10-CM | POA: Diagnosis present

## 2021-02-13 DIAGNOSIS — Z975 Presence of (intrauterine) contraceptive device: Secondary | ICD-10-CM

## 2021-02-13 DIAGNOSIS — A4152 Sepsis due to Pseudomonas: Principal | ICD-10-CM | POA: Diagnosis present

## 2021-02-13 DIAGNOSIS — E039 Hypothyroidism, unspecified: Secondary | ICD-10-CM | POA: Diagnosis present

## 2021-02-13 DIAGNOSIS — E876 Hypokalemia: Secondary | ICD-10-CM | POA: Diagnosis present

## 2021-02-13 DIAGNOSIS — K521 Toxic gastroenteritis and colitis: Secondary | ICD-10-CM

## 2021-02-13 DIAGNOSIS — Z20822 Contact with and (suspected) exposure to covid-19: Secondary | ICD-10-CM | POA: Diagnosis present

## 2021-02-13 DIAGNOSIS — Z9012 Acquired absence of left breast and nipple: Secondary | ICD-10-CM

## 2021-02-13 DIAGNOSIS — E222 Syndrome of inappropriate secretion of antidiuretic hormone: Secondary | ICD-10-CM

## 2021-02-13 DIAGNOSIS — G9341 Metabolic encephalopathy: Secondary | ICD-10-CM | POA: Diagnosis present

## 2021-02-13 DIAGNOSIS — D709 Neutropenia, unspecified: Secondary | ICD-10-CM | POA: Diagnosis present

## 2021-02-13 DIAGNOSIS — K219 Gastro-esophageal reflux disease without esophagitis: Secondary | ICD-10-CM | POA: Diagnosis present

## 2021-02-13 DIAGNOSIS — Z800 Family history of malignant neoplasm of digestive organs: Secondary | ICD-10-CM

## 2021-02-13 DIAGNOSIS — F819 Developmental disorder of scholastic skills, unspecified: Secondary | ICD-10-CM | POA: Diagnosis present

## 2021-02-13 DIAGNOSIS — N390 Urinary tract infection, site not specified: Secondary | ICD-10-CM | POA: Diagnosis present

## 2021-02-13 DIAGNOSIS — R6520 Severe sepsis without septic shock: Secondary | ICD-10-CM

## 2021-02-13 DIAGNOSIS — Z888 Allergy status to other drugs, medicaments and biological substances status: Secondary | ICD-10-CM

## 2021-02-13 DIAGNOSIS — A045 Campylobacter enteritis: Secondary | ICD-10-CM | POA: Diagnosis present

## 2021-02-13 DIAGNOSIS — Z923 Personal history of irradiation: Secondary | ICD-10-CM

## 2021-02-13 DIAGNOSIS — Z79890 Hormone replacement therapy (postmenopausal): Secondary | ICD-10-CM

## 2021-02-13 DIAGNOSIS — Z66 Do not resuscitate: Secondary | ICD-10-CM | POA: Diagnosis present

## 2021-02-13 DIAGNOSIS — Z79899 Other long term (current) drug therapy: Secondary | ICD-10-CM

## 2021-02-13 DIAGNOSIS — D61818 Other pancytopenia: Secondary | ICD-10-CM | POA: Diagnosis present

## 2021-02-13 DIAGNOSIS — R531 Weakness: Secondary | ICD-10-CM

## 2021-02-13 DIAGNOSIS — D259 Leiomyoma of uterus, unspecified: Secondary | ICD-10-CM | POA: Diagnosis present

## 2021-02-13 DIAGNOSIS — Z803 Family history of malignant neoplasm of breast: Secondary | ICD-10-CM

## 2021-02-13 DIAGNOSIS — R410 Disorientation, unspecified: Secondary | ICD-10-CM

## 2021-02-13 LAB — URINALYSIS, COMPLETE (UACMP) WITH MICROSCOPIC
Bilirubin Urine: NEGATIVE
Glucose, UA: NEGATIVE mg/dL
Ketones, ur: NEGATIVE mg/dL
Nitrite: NEGATIVE
Protein, ur: 30 mg/dL — AB
Specific Gravity, Urine: 1.009 (ref 1.005–1.030)
pH: 5 (ref 5.0–8.0)

## 2021-02-13 LAB — COMPREHENSIVE METABOLIC PANEL
ALT: 16 U/L (ref 0–44)
AST: 15 U/L (ref 15–41)
Albumin: 3.1 g/dL — ABNORMAL LOW (ref 3.5–5.0)
Alkaline Phosphatase: 59 U/L (ref 38–126)
Anion gap: 15 (ref 5–15)
BUN: 38 mg/dL — ABNORMAL HIGH (ref 6–20)
CO2: 21 mmol/L — ABNORMAL LOW (ref 22–32)
Calcium: 8.6 mg/dL — ABNORMAL LOW (ref 8.9–10.3)
Chloride: 89 mmol/L — ABNORMAL LOW (ref 98–111)
Creatinine, Ser: 2.94 mg/dL — ABNORMAL HIGH (ref 0.44–1.00)
GFR, Estimated: 19 mL/min — ABNORMAL LOW (ref >60)
Glucose, Bld: 100 mg/dL — ABNORMAL HIGH (ref 70–99)
Potassium: 3.2 mmol/L — ABNORMAL LOW (ref 3.5–5.1)
Sodium: 125 mmol/L — ABNORMAL LOW (ref 135–145)
Total Bilirubin: 1 mg/dL (ref 0.3–1.2)
Total Protein: 7.5 g/dL (ref 6.5–8.1)

## 2021-02-13 LAB — CBC WITH DIFFERENTIAL/PLATELET
Abs Immature Granulocytes: 0.04 10*3/uL (ref 0.00–0.07)
Basophils Absolute: 0 10*3/uL (ref 0.0–0.1)
Basophils Relative: 1 %
Eosinophils Absolute: 0 10*3/uL (ref 0.0–0.5)
Eosinophils Relative: 3 %
HCT: 19.7 % — ABNORMAL LOW (ref 36.0–46.0)
Hemoglobin: 6.9 g/dL — ABNORMAL LOW (ref 12.0–15.0)
Immature Granulocytes: 5 %
Lymphocytes Relative: 4 %
Lymphs Abs: 0 10*3/uL — ABNORMAL LOW (ref 0.7–4.0)
MCH: 30.5 pg (ref 26.0–34.0)
MCHC: 35 g/dL (ref 30.0–36.0)
MCV: 87.2 fL (ref 80.0–100.0)
Monocytes Absolute: 0 10*3/uL — ABNORMAL LOW (ref 0.1–1.0)
Monocytes Relative: 5 %
Neutro Abs: 0.7 10*3/uL — ABNORMAL LOW (ref 1.7–7.7)
Neutrophils Relative %: 82 %
Platelets: 80 10*3/uL — ABNORMAL LOW (ref 150–400)
RBC: 2.26 MIL/uL — ABNORMAL LOW (ref 3.87–5.11)
RDW: 14.6 % (ref 11.5–15.5)
WBC: 0.8 10*3/uL — CL (ref 4.0–10.5)
nRBC: 0 % (ref 0.0–0.2)

## 2021-02-13 LAB — RESP PANEL BY RT-PCR (FLU A&B, COVID) ARPGX2
Influenza A by PCR: NEGATIVE
Influenza B by PCR: NEGATIVE
SARS Coronavirus 2 by RT PCR: NEGATIVE

## 2021-02-13 LAB — MAGNESIUM: Magnesium: 1.5 mg/dL — ABNORMAL LOW (ref 1.7–2.4)

## 2021-02-13 LAB — POC URINE PREG, ED: Preg Test, Ur: NEGATIVE

## 2021-02-13 LAB — LACTIC ACID, PLASMA: Lactic Acid, Venous: 1 mmol/L (ref 0.5–1.9)

## 2021-02-13 LAB — PREPARE RBC (CROSSMATCH)

## 2021-02-13 MED ORDER — VANCOMYCIN HCL IN DEXTROSE 1-5 GM/200ML-% IV SOLN
1000.0000 mg | Freq: Once | INTRAVENOUS | Status: AC
  Administered 2021-02-13: 1000 mg via INTRAVENOUS
  Filled 2021-02-13: qty 200

## 2021-02-13 MED ORDER — CLONAZEPAM 0.5 MG PO TABS
0.5000 mg | ORAL_TABLET | Freq: Every day | ORAL | Status: DC
  Administered 2021-02-14 (×2): 0.5 mg via ORAL
  Filled 2021-02-13 (×2): qty 1

## 2021-02-13 MED ORDER — ACETAMINOPHEN 325 MG PO TABS: 650.0000 mg | ORAL_TABLET | Freq: Four times a day (QID) | ORAL | Status: DC | PRN

## 2021-02-13 MED ORDER — METRONIDAZOLE 500 MG/100ML IV SOLN
500.0000 mg | Freq: Two times a day (BID) | INTRAVENOUS | Status: DC
  Administered 2021-02-14 (×2): 500 mg via INTRAVENOUS
  Filled 2021-02-13 (×3): qty 100

## 2021-02-13 MED ORDER — FLUTICASONE PROPIONATE 50 MCG/ACT NA SUSP
1.0000 | Freq: Every day | NASAL | Status: DC | PRN
  Filled 2021-02-13: qty 16

## 2021-02-13 MED ORDER — BENZTROPINE MESYLATE 0.5 MG PO TABS
0.5000 mg | ORAL_TABLET | Freq: Two times a day (BID) | ORAL | Status: DC
  Administered 2021-02-14 – 2021-02-15 (×4): 0.5 mg via ORAL
  Filled 2021-02-13 (×5): qty 1

## 2021-02-13 MED ORDER — MELATONIN 5 MG PO TABS
2.5000 mg | ORAL_TABLET | Freq: Every evening | ORAL | Status: DC | PRN
  Filled 2021-02-13: qty 0.5

## 2021-02-13 MED ORDER — SODIUM CHLORIDE 0.9 % IV SOLN
Freq: Once | INTRAVENOUS | Status: DC
  Filled 2021-02-13: qty 250

## 2021-02-13 MED ORDER — HALOPERIDOL DECANOATE 100 MG/ML IM SOLN
100.0000 mg | INTRAMUSCULAR | Status: DC
Start: 2021-02-14 — End: 2021-02-15
  Administered 2021-02-14: 100 mg via INTRAMUSCULAR
  Filled 2021-02-13 (×2): qty 1

## 2021-02-13 MED ORDER — SODIUM CHLORIDE 0.9 % IV SOLN: 2.0000 g | INTRAVENOUS | Status: DC

## 2021-02-13 MED ORDER — SODIUM CHLORIDE 0.9 % IV SOLN
2.0000 g | Freq: Once | INTRAVENOUS | Status: AC
  Administered 2021-02-13: 2 g via INTRAVENOUS
  Filled 2021-02-13: qty 2

## 2021-02-13 MED ORDER — LACTATED RINGERS IV BOLUS (SEPSIS)
1000.0000 mL | Freq: Once | INTRAVENOUS | Status: AC
  Administered 2021-02-13: 1000 mL via INTRAVENOUS

## 2021-02-13 MED ORDER — HYDROXYZINE HCL 25 MG PO TABS
25.0000 mg | ORAL_TABLET | Freq: Three times a day (TID) | ORAL | Status: DC | PRN
  Filled 2021-02-13: qty 1

## 2021-02-13 MED ORDER — METRONIDAZOLE 500 MG/100ML IV SOLN: 500.0000 mg | Freq: Once | INTRAVENOUS | Status: DC

## 2021-02-13 MED ORDER — ACETAMINOPHEN 650 MG RE SUPP: 650.0000 mg | Freq: Four times a day (QID) | RECTAL | Status: DC | PRN

## 2021-02-13 MED ORDER — ONDANSETRON HCL 4 MG PO TABS: 4.0000 mg | ORAL_TABLET | Freq: Four times a day (QID) | ORAL | Status: DC | PRN

## 2021-02-13 MED ORDER — VANCOMYCIN HCL 500 MG/100ML IV SOLN
500.0000 mg | Freq: Once | INTRAVENOUS | Status: AC
  Administered 2021-02-14: 500 mg via INTRAVENOUS
  Filled 2021-02-13: qty 100

## 2021-02-13 MED ORDER — DIVALPROEX SODIUM 500 MG PO DR TAB
500.0000 mg | DELAYED_RELEASE_TABLET | Freq: Two times a day (BID) | ORAL | Status: DC
  Administered 2021-02-14 – 2021-02-15 (×4): 500 mg via ORAL
  Filled 2021-02-13 (×5): qty 1

## 2021-02-13 MED ORDER — VANCOMYCIN VARIABLE DOSE PER UNSTABLE RENAL FUNCTION (PHARMACIST DOSING): Status: DC

## 2021-02-13 MED ORDER — LACTATED RINGERS IV SOLN: INTRAVENOUS | Status: DC

## 2021-02-13 MED ORDER — MAGNESIUM SULFATE IN D5W 1-5 GM/100ML-% IV SOLN
1.0000 g | Freq: Once | INTRAVENOUS | Status: AC
  Administered 2021-02-13: 1 g via INTRAVENOUS
  Filled 2021-02-13: qty 100

## 2021-02-13 MED ORDER — LEVOTHYROXINE SODIUM 88 MCG PO TABS
88.0000 ug | ORAL_TABLET | Freq: Every day | ORAL | Status: DC
  Administered 2021-02-14 – 2021-02-15 (×2): 88 ug via ORAL
  Filled 2021-02-13 (×3): qty 1

## 2021-02-13 MED ORDER — HYDROCODONE-ACETAMINOPHEN 5-325 MG PO TABS: 1.0000 | ORAL_TABLET | Freq: Two times a day (BID) | ORAL | Status: DC | PRN

## 2021-02-13 MED ORDER — ONDANSETRON HCL 4 MG/2ML IJ SOLN
4.0000 mg | Freq: Four times a day (QID) | INTRAMUSCULAR | Status: DC | PRN
Start: 2021-02-13 — End: 2021-02-15

## 2021-02-13 MED ORDER — SODIUM CHLORIDE 0.9 % IV SOLN
10.0000 mL/h | Freq: Once | INTRAVENOUS | Status: AC
  Administered 2021-02-14: 10 mL/h via INTRAVENOUS

## 2021-02-13 MED ORDER — HALOPERIDOL 5 MG PO TABS
5.0000 mg | ORAL_TABLET | Freq: Two times a day (BID) | ORAL | Status: DC
  Administered 2021-02-14 – 2021-02-15 (×4): 5 mg via ORAL
  Filled 2021-02-13 (×3): qty 1
  Filled 2021-02-13: qty 3
  Filled 2021-02-13: qty 1

## 2021-02-13 NOTE — ED Provider Notes (Signed)
Emergency Medicine Provider Triage Evaluation Note  Tamara [name] , a 50 y.o. female  was evaluated in triage.  Pt complains of weakness, hypotension, nausea and vomiting. Sent from cancer center for evaluation.  Review of Systems  Positive: Nausea, vomiting, generalized weakness Negative: Fever  Physical Exam  BP 98/60 (BP Location: Right Arm)   Pulse (!) 113   Temp 98.7 F (37.1 C) (Oral)   Resp 20   Ht 5\' 2"  (1.575 m)   Wt 65.5 kg   SpO2 100%   BMI 26.39 kg/m  Gen:   Awake, no distress   Resp:  Normal effort  MSK:   Moves extremities without difficulty  Other:    Medical Decision Making  Medically screening exam initiated at 12:04 PM.  Appropriate orders placed.  IFE VITELLI was informed that the remainder of the evaluation will be completed by another provider, this initial triage assessment does not replace that evaluation, and the importance of remaining in the ED until their evaluation is complete.   Victorino Dike, FNP 02/13/21 1437    Lucrezia Starch, MD 02/13/21 [phone]

## 2021-02-13 NOTE — H&P (Addendum)
History and Physical   ELLER SWEIS IOX:[id number] DOB: 02/26/1971 DOA: 02/13/2021  PCP: [facility]  Outpatient Specialists: Dr. Rogue Bussing, medical oncology Patient coming from: Outpatient oncology  I have personally briefly reviewed patient's old medical records in [place].  Chief Concern: Weakness, lethargy, hypotension  HPI: Tamara [name] is a 50 y.o. female with medical history significant for stage IV left breast cancer with positive contralateral right breast inflammatory/malignancy status postbiopsy, learning disability, currently on antineoplastic medication with Ivette Loyal, status postchemotherapy cycle, status post palliative radiation therapy, schizophrenia, history of electrolyte imbalance including chronic hyponatremia presumed secondary to SIADH/psychiatric medication, who presents from oncology outpatient for chief concerns of lethargy, found to have neutropenia and hypotension.  Patient was sent to the ED from hospice and palliative medicine provider for concerns of sepsis.  At bedside she is able to tell me her full name, and location of hopsital. She doesn't know the current year or her age. She knows her birthday.  She is awake and arousable.  She appeared weak.  She follows commands by squeezing my hands removing her legs.  Social history: Patient states she lives with her mother and that her mother takes care of her. She denies tobacco, etoh.   Vaccination history: Patient does not know  ROS: Constitutional: no weight change, no fever ENT/Mouth: no sore throat, no rhinorrhea Eyes: no eye pain, no vision changes Cardiovascular: no chest pain, no dyspnea,  no edema, no palpitations Respiratory: no cough, no sputum, no wheezing Gastrointestinal: no nausea, no vomiting, no diarrhea, no constipation Genitourinary: no urinary incontinence, no dysuria, no hematuria Musculoskeletal: no arthralgias, no myalgias Skin: no skin lesions, no  pruritus, Neuro: + weakness, no loss of consciousness, no syncope Psych: no anxiety, no depression, + decrease appetite Heme/Lymph: no bruising, no bleeding  ED Course: Discussed with emergency medicine provider, patient requiring hospitalization for chief concerns of sepsis in immunocompromised patient.  Vitals in the emergency department was remarkable for temperature 98.7, respiration rate of 16, heart rate of 128, initial blood pressure 91/57 improved to 98/60, SPO2 of 100% on room air.  Labs in the emergency department was remarkable for serum sodium 125, potassium 3.2, chloride 89, bicarb 21, BUN 38, serum creatinine of 2.94, nonfasting blood glucose 100, WBC 0.8, hemoglobin 6.9, platelets 80, GFR 19.  Urine pregnancy was negative.  Magnesium level was 1.5.  In the emergency department patient was given lactated Ringer 1 L bolus, started on lactated ringer IVF at 150 mL/h, IV cefepime, IV vancomycin, IV metronidazole per sepsis protocol.  Assessment/Plan  Principal Problem:   Sepsis ([place]) Active Problems:   Schizophrenia, paranoid type (HCC)   GERD (gastroesophageal reflux disease)   Carcinoma of overlapping sites of left breast in female, estrogen receptor negative ([place])   Breast cancer ([place])   ASCUS of cervix with negative high risk HPV   Bipolar affective disorder ([place])   Leiomyoma of uterus   Hypothyroidism   Weakness   SIADH (syndrome of inappropriate ADH production) (HCC)   Hypomagnesemia   Hypokalemia   AKI (acute kidney injury) ([place])   At high risk for infection due to neutropenia   Neutropenia (HCC)   # Generalized malaise with neutropenia concerning for sepsis - Patient currently meets SIRS criteria for possible severe sepsis with elevated heart rate, neutropenia, renal involvement acute kidney injury - Etiology work-up in progress, possible source is urine - Patient is immunocompromised in setting of left stage IV breast cancer currently on chemotherapy -  Status  post cefepime, vancomycin, metronidazole IV per EDP - We will continue cefepime and vancomycin per pharmacy IV - CBC in the a.m. - Blood cultures x2, urine culture ordered - Check lactic acid - Check procalcitonin, C. difficile, GI panel  # Hypokalemia and hypomagnesemia-present on admission - Replace with magnesium 1 g IV - Check magnesium in a.m. - BMP in the a.m.  # Chronic hyponatremia-appears to be at baseline - Serum sodium range has been 123-129 since August 2022 - BMP in the a.m.  # Acute on chronic anemia-1 unit packed red blood ordered for transfusion per EDP - Repeat CBC scheduled at 2300, RN aware - Discussed with nursing staff via secure chat  # Neutropenia with absolute neutrophil count of 700 - CBC in the a.m. - Would recommend a.m. team to consider consultation to hematologist if her WBC does not improve  # Ovoid sclerotic focus in the right sphenoid bone adjacent to right orbital apex - 8 mm in size - Discuss with mother and sister over the phone and they aware of this lesion - Patient will need outpatient follow-up and sister and mother agrees  # Learning disability-at baseline  # Hypothyroid-levothyroxine 88 mcg daily resumed for 9/22  # Thrombocytopenia-CBC in the AM  # Stage IV triple negative left breast cancer-outpatient follow-up  # History of schizophrenia, paranoid type-resumed home Depakote 500 mg every 12 hours, haloperidol 5 mg p.o. twice daily - Per med reconciliation, patient is due for Haldol decanoate IM every 28 days on 02/14/2021, this has been resumed  Chart reviewed.   DVT prophylaxis: TED hose Code Status: Full code Diet: Heart healthy Family Communication: Updated mother and sister over the phone Disposition Plan: Pending clinical course Consults called: None at this time Admission status: MedSurg, observation, no telemetry  Past Medical History:  Diagnosis Date   Breast cancer ([place]) 03/2020   left   Family history of  breast cancer    Family history of stomach cancer    Family history of stomach cancer    GERD (gastroesophageal reflux disease)    Hypothyroidism    IUD (intrauterine device) in place IUD was placed in 2016   Leiomyoma of uterus    Schizophrenia ([place])    Thyroid disease    Past Surgical History:  Procedure Laterality Date   ADJACENT TISSUE TRANSFER/TISSUE REARRANGEMENT N/A 10/29/2020   Procedure: POSSIBLE ADJACENT TISSUE TRANSFER, TISSUE REARRANGEMENT;  Surgeon: Wallace Going, DO;  Location: [facility] ORS;  Service: Plastics;  Laterality: N/A;   APPLICATION OF A-CELL OF CHEST/ABDOMEN N/A 10/29/2020   Procedure: POSSIBLE APPLICATION OF A-CELL OF CHEST/ABDOMEN;  Surgeon: Wallace Going, DO;  Location: [facility] ORS;  Service: Plastics;  Laterality: N/A;   APPLICATION OF WOUND VAC N/A 10/29/2020   Procedure: POSSIBLE APPLICATION OF WOUND VAC;  Surgeon: Wallace Going, DO;  Location: [facility] ORS;  Service: Plastics;  Laterality: N/A;   BREAST BIOPSY Left 03/2020   [facility]- Triple neg- Stage 3   PORTACATH PLACEMENT Right 05/02/2020   Procedure: INSERTION PORT-A-CATH;  Surgeon: Herbert Pun, MD;  Location: [facility] ORS;  Service: General;  Laterality: Right;   TOTAL MASTECTOMY Left 10/29/2020   Procedure: TOTAL MASTECTOMY --- RADICAL;  Surgeon: Herbert Pun, MD;  Location: [facility] ORS;  Service: General;  Laterality: Left;  needs 3 hrs   Social History:  reports that she has never smoked. She has never used smokeless tobacco. She reports that she does not drink alcohol and does not use drugs.  Allergies  Allergen Reactions  Paclitaxel Anaphylaxis    Hypersensitivity reaction [flushing; shortness of breath; drop in blood pressure; tachycardia]   Family History  Problem Relation Age of Onset   Breast cancer Mother    Stomach cancer Maternal Grandfather    Other Maternal Aunt        died from tumor, brain?   Family history: Family history reviewed and not pertinent for sepsis.    Prior to Admission medications   Medication Sig Start Date End Date Taking? Authorizing Provider  benztropine (COGENTIN) 0.5 MG tablet Take 1 tablet (0.5 mg total) by mouth 2 (two) times daily. 06/08/19  Yes Money, Lowry Ram, FNP  cetirizine (ZYRTEC) 10 MG tablet Take 1 tablet (10 mg total) by mouth daily as needed for allergies. 09/13/15  Yes Hildred Priest, MD  clonazePAM (KLONOPIN) 0.5 MG tablet Take 0.5 mg by mouth at bedtime. 06/29/20  Yes [provider]  diphenoxylate-atropine (LOMOTIL) 2.5-0.025 MG tablet Take 1 tablet by mouth 4 (four) times daily as needed for diarrhea or loose stools. Take it along with immodium 02/01/21  Yes Cammie Sickle, MD  divalproex (DEPAKOTE) 500 MG DR tablet Take 1 tablet (500 mg total) by mouth every 12 (twelve) hours. 06/08/19  Yes Money, Lowry Ram, FNP  fluticasone (FLONASE) 50 MCG/ACT nasal spray Place 1 spray into both nostrils daily. Patient taking differently: Place 1 spray into both nostrils daily as needed for allergies. 10/16/20  Yes Cammie Sickle, MD  haloperidol (HALDOL) 5 MG tablet Take 1 tablet (5 mg total) by mouth 3 (three) times daily. Patient taking differently: Take 5 mg by mouth 2 (two) times daily. 06/08/19  Yes Money, Lowry Ram, FNP  haloperidol decanoate (HALDOL DECANOATE) 100 MG/ML injection Inject 100 mg into the muscle every 28 (twenty-eight) days.   Yes [provider]  HYDROcodone-acetaminophen (NORCO/VICODIN) 5-325 MG tablet Take 1 tablet by mouth every 12 (twelve) hours as needed for moderate pain. 1 pill every 12 hours as needed 02/01/21  Yes Cammie Sickle, MD  hydrOXYzine (ATARAX/VISTARIL) 25 MG tablet Take 1 tablet (25 mg total) by mouth every 8 (eight) hours as needed. 01/18/21  Yes Cammie Sickle, MD  KLOR-CON M20 20 MEQ tablet TAKE 1 TABLET BY MOUTH TWICE A DAY 01/21/21  Yes Cammie Sickle, MD  levothyroxine (SYNTHROID) 88 MCG tablet TAKE 1 TABLET (88 MCG TOTAL) BY MOUTH  DAILY AT 6 (SIX) AM. 02/08/21  Yes Cammie Sickle, MD  lidocaine-prilocaine (EMLA) cream Apply 1 application topically as needed. 08/29/20  Yes Cammie Sickle, MD  medroxyPROGESTERone (DEPO-PROVERA) 150 MG/ML injection Inject 150 mg into the muscle every 3 (three) months. 07/12/20 07/07/21 Yes [provider]  MELATONIN PO Take 1 tablet by mouth at bedtime as needed (sleep).   Yes [provider]  ondansetron (ZOFRAN) 8 MG tablet Take 1 tablet (8 mg total) by mouth every 8 (eight) hours as needed for nausea or vomiting. 02/04/21  Yes Cammie Sickle, MD  prochlorperazine (COMPAZINE) 10 MG tablet TAKE 1 TABLET BY MOUTH EVERY 6 HOURS AS NEEDED FOR NAUSEA OR VOMITING. 02/04/21  Yes Cammie Sickle, MD  silver sulfADIAZINE (SILVADENE) 1 % cream Apply 1 application topically daily. 01/03/21   [place], DO  sodium chloride 1 g tablet Take 1 tablet (1 g total) by mouth 3 (three) times daily. 07/30/20   Cammie Sickle, MD   Physical Exam: Vitals:   02/13/21 1158 02/13/21 1159  BP: 98/60   Pulse: (!) 113   Resp:  20   Temp: 98.7 F (37.1 C)   TempSrc: Oral   SpO2: 100%   Weight:  65.5 kg  Height:  5\' 2"  (1.575 m)   Constitutional: appears age-appropriate, NAD, calm, comfortable Eyes: PERRL, lids and conjunctivae normal ENMT: Mucous membranes are moist. Posterior pharynx clear of any exudate or lesions. Poor dentition. Hearing appropriate Neck: normal, supple, no masses, no thyromegaly Respiratory: clear to auscultation bilaterally, no wheezing, no crackles. Normal respiratory effort. No accessory muscle use.  Cardiovascular: Regular rate and rhythm, no murmurs / rubs / gallops. No extremity edema. 2+ pedal pulses. No carotid bruits.  Abdomen: no tenderness, no masses palpated, no hepatosplenomegaly. Bowel sounds positive.  Musculoskeletal: no clubbing / cyanosis. No joint deformity upper and lower extremities. Good ROM, no contractures, no  atrophy. Normal muscle tone.  Skin: Left breasts changes consistent with history of breast cancer with necrosis that appears chronic Neurologic: Sensation intact. Strength 5/5 in all 4.  Psychiatric: Normal judgment and insight. Alert and oriented x 3. Normal mood.   EKG: independently reviewed, showing sinus tachycardia with rate of 111, QTc 448  Chest x-ray on Admission: I personally reviewed and I agree with radiologist reading as below.  CT HEAD WO CONTRAST (5MM)  Result Date: 02/13/2021 CLINICAL DATA:  Neuro deficit, acute, stroke suspected. Additional history provided: Patient from cancer center with increased weakness, hypotension, nausea/vomiting, altered mental status. EXAM: CT HEAD WITHOUT CONTRAST TECHNIQUE: Contiguous axial images were obtained from the base of the skull through the vertex without intravenous contrast. COMPARISON:  Brain MRI 05/12/2020. FINDINGS: Brain: Mild generalized cerebral and cerebellar atrophy. There is no acute intracranial hemorrhage. No demarcated cortical infarct. No extra-axial fluid collection. No evidence of an intracranial mass. No midline shift. Vascular: No hyperdense vessel.  Atherosclerotic calcifications. Skull: Nonspecific 8 mm ovoid sclerotic focus within the right sphenoid bone, adjacent to the right orbital apex (series 3, image 13). Otherwise unremarkable. Sinuses/Orbits: Visualized orbits show no acute finding. No significant paranasal sinus disease at the imaged levels. IMPRESSION: No evidence of acute intracranial abnormality. Mild generalized cerebral and cerebellar atrophy. 8 mm ovoid sclerotic focus within the right sphenoid bone, adjacent to the right orbital apex. This finding is nonspecific, but an osseous metastasis cannot be excluded given the patient's history of malignancy. 3-6 month CT follow-up recommended to ensure stability. Electronically Signed   By: Kellie Simmering D.O.   On: 02/13/2021 15:38   DG Chest Port 1 View  Result Date:  02/13/2021 CLINICAL DATA:  Possible sepsis EXAM: PORTABLE CHEST 1 VIEW COMPARISON:  05/02/2020 FINDINGS: Right chest wall port catheter tip in similar position. Lung volumes are low. No new consolidation. No pleural effusion. Stable cardiomediastinal contours IMPRESSION: No acute process in the chest. Electronically Signed   By: Macy Mis M.D.   On: 02/13/2021 14:59    Labs on Admission: I have personally reviewed following labs  CBC: Recent Labs  Lab 02/08/21 0823 02/13/21 1057  WBC 1.6* 0.8*  NEUTROABS 1.1* 0.7*  HGB 8.7* 6.9*  HCT 24.7* 19.7*  MCV 87.9 87.2  PLT 93* 80*   Basic Metabolic Panel: Recent Labs  Lab 02/08/21 0823 02/13/21 1057 02/13/21 1115  NA 127* 125*  --   K 3.3* 3.2*  --   CL 89* 89*  --   CO2 25 21*  --   GLUCOSE 98 100*  --   BUN <5* 38*  --   CREATININE 0.50 2.94*  --   CALCIUM 9.0 8.6*  --  MG  --   --  1.5*   GFR: Estimated Creatinine Clearance: 20.6 mL/min (A) (by C-G formula based on SCr of 2.94 mg/dL (H)).  Liver Function Tests: Recent Labs  Lab 02/08/21 0823 02/13/21 1057  AST 13* 15  ALT 11 16  ALKPHOS 74 59  BILITOT 0.8 1.0  PROT 7.3 7.5  ALBUMIN 3.3* 3.1*   Urine analysis:    Component Value Date/Time   COLORURINE AMBER (A) 02/13/2021 1459   APPEARANCEUR HAZY (A) 02/13/2021 1459   APPEARANCEUR Clear 11/11/2013 2142   LABSPEC 1.009 02/13/2021 1459   LABSPEC 1.001 11/11/2013 2142   PHURINE 5.0 02/13/2021 1459   GLUCOSEU NEGATIVE 02/13/2021 1459   GLUCOSEU Negative 11/11/2013 2142   HGBUR MODERATE (A) 02/13/2021 1459   BILIRUBINUR NEGATIVE 02/13/2021 1459   BILIRUBINUR Negative 11/11/2013 2142   KETONESUR NEGATIVE 02/13/2021 1459   PROTEINUR 30 (A) 02/13/2021 1459   NITRITE NEGATIVE 02/13/2021 1459   LEUKOCYTESUR TRACE (A) 02/13/2021 1459   LEUKOCYTESUR 1+ 11/11/2013 2142   Dr. Tobie Poet [facility]  If 7PM-7AM, please contact overnight-coverage provider If 7AM-7PM, please contact day coverage  provider [email or web address]  02/13/2021, 4:46 PM

## 2021-02-13 NOTE — Progress Notes (Signed)
PHARMACY -  BRIEF ANTIBIOTIC NOTE   Pharmacy has received consult(s) for Cefepime and Vancomycin from an ED provider.  The patient's profile has been reviewed for ht/wt/allergies/indication/available labs.    One time order(s) placed for  Cefepime & Vancomycin  Further antibiotics/pharmacy consults should be ordered by admitting physician if indicated.                       Thank you, Almarie Kurdziel Rodriguez-Guzman PharmD, BCPS 02/13/2021 2:22 PM

## 2021-02-13 NOTE — Sepsis Progress Note (Signed)
Notified bedside nurse of need to draw lactic acid and blood cultures.  

## 2021-02-13 NOTE — ED Notes (Signed)
Pt walked to the restroom at this time. BM watery with solid pieces, green - Urine lime colored

## 2021-02-13 NOTE — Progress Notes (Signed)
Radiation Oncology Follow up Note  Name: Tamara [name]   Date:   02/13/2021 MRN:  [id number] DOB: [birth year]    This 50 y.o. female presents to the clinic today for 2-week follow-up status post palliative radiation therapy to her left chest wall and patient with stage IV triple negative invasive mammary carcinoma.  REFERRING PROVIDER: [facility]  HPI: Patient is a 50 year old female seen 2 weeks having completed palliative radiation therapy to her left chest wall for locally advanced ulcerative necrotic breast cancer.  She has developed right breast metastatic disease is currently on.Trodelvy.  She has multiple medical problems including hyponatremia SIADH and is schizophrenic.  Her pain is currently controlled with hydrocodone.  She took seen today and is doing poorly we are referring her to palliative care for evaluation.  COMPLICATIONS OF TREATMENT: none  FOLLOW UP COMPLIANCE: keeps appointments   PHYSICAL EXAM:  BP (!) 91/57 (BP Location: Right Arm, Patient Position: Sitting)   Pulse (!) 126   Temp 97.6 F (36.4 C) (Tympanic)   Resp 16   Wt 144 lb 4.8 oz (65.5 kg)   BMI 26.39 kg/m  Patient has widespread metastatic disease with subcutaneous infiltration of the right breast.  Area of treatment the left breast shows granulation tissue and scarring no evidence of all open ulcerations.  Well-developed well-nourished patient in NAD. HEENT reveals PERLA, EOMI, discs not visualized.  Oral cavity is clear. No oral mucosal lesions are identified. Neck is clear without evidence of cervical or supraclavicular adenopathy. Lungs are clear to A&P. Cardiac examination is essentially unremarkable with regular rate and rhythm without murmur rub or thrill. Abdomen is benign with no organomegaly or masses noted. Motor sensory and DTR levels are equal and symmetric in the upper and lower extremities. Cranial nerves II through XII are grossly intact. Proprioception is intact. No peripheral  adenopathy or edema is identified. No motor or sensory levels are noted. Crude visual fields are within normal range.  RADIOLOGY RESULTS: No current films to review  PLAN: This time patient is being seen by palliative care.  I see no further rationale for any further radiation therapy at this time.  [name] overall clinical course is rapidly deteriorating and hospice will be a option in the near future.  Like to take this up during flight and participate in this unfortunate patient's care.  I would like to take this opportunity to thank you for allowing me to participate in the care of your patient.Noreene Filbert, MD

## 2021-02-13 NOTE — ED Triage Notes (Signed)
Pt brought over by the cancer center for increased weakness, hypotension, N/V, and AMS.  Pt has been not feeling well for a couple days now and has been getting worse.  Pt currently has even and unlabored respirations.

## 2021-02-13 NOTE — Progress Notes (Signed)
Pharmacy Antibiotic Note  Tamara [name] is a 50 y.o. female admitted on 02/13/2021.  Pharmacy has been consulted for vancomycin and cefepime dosing for sepsis.  Plan: Cefepime 2 g IV q24h Pt received 1 g IV vanc in ED. Will order additional 500 mg for total loading dose of 1.5 g. Will hold off on maintenance dose given pt Scr 2.94 (baseline appears to be <1 based on previous labs) Obtain vanc levels at steady state if continued Monitor renal function and adjust dose as clinically indicated   Height: 5\' 2"  (157.5 cm) Weight: 65.5 kg (144 lb 4.7 oz) IBW/kg (Calculated) : 50.1  Temp (24hrs), Avg:98.4 F (36.9 C), Min:97.6 F (36.4 C), Max:98.7 F (37.1 C)  Recent Labs  Lab 02/08/21 0823 02/13/21 1057 02/13/21 1206  WBC 1.6* 0.8*  --   CREATININE 0.50 2.94*  --   LATICACIDVEN  --   --  1.0    Estimated Creatinine Clearance: 20.6 mL/min (A) (by C-G formula based on SCr of 2.94 mg/dL (H)).    Allergies  Allergen Reactions   Paclitaxel Anaphylaxis    Hypersensitivity reaction [flushing; shortness of breath; drop in blood pressure; tachycardia]    Antimicrobials this admission: 9/21 cefepime >>  9/21 vancomycin >>  9/21 metronidazole >>  Dose adjustments this admission:   Microbiology results: 9/21 BCx: sent 9/21 UCx: sent   Thank you for allowing pharmacy to be a part of this patient's care.  Tamara [name] 02/13/2021 9:58 PM

## 2021-02-13 NOTE — Consult Note (Signed)
CODE SEPSIS - PHARMACY COMMUNICATION  **Broad Spectrum Antibiotics should be administered within 1 hour of Sepsis diagnosis**  Time Code Sepsis Called/Page Received: 1420  Antibiotics Ordered: cefepime , metronidazole and vancomycin  Time of 1st antibiotic administration: 1601  Lynnann Knudsen Rodriguez-Guzman PharmD, BCPS 02/13/2021 2:20 PM

## 2021-02-13 NOTE — Progress Notes (Signed)
Pt's mother reports that pt has been confused, weak, not eating, and had diarrhea since Monday.

## 2021-02-13 NOTE — Sepsis Progress Note (Signed)
ELink tracking the Code Sepsis. 

## 2021-02-13 NOTE — ED Provider Notes (Signed)
[facility] Emergency Department Provider Note    Event Date/Time   First MD Initiated Contact with Patient 02/13/21 1411     (approximate)  I have reviewed the triage vital signs and the nursing notes.   HISTORY  Chief Complaint Weakness, Altered Mental Status, and Nausea  Level V Caveat:  AMS  HPI Tamara [name] is a 50 y.o. female on chemo and radiation therapy for breast cancer presents the ER for confusion for several days associated with generalized weakness nausea vomiting.  Patient was sent over from the cancer center she was found to be tachycardic as well as hypotensive and with increasing confusion.  Concern was for neutropenic fever and sepsis there is never any documented temperature and family has not reported any temperature at home.  Past Medical History:  Diagnosis Date   Breast cancer ([place]) 03/2020   left   Family history of breast cancer    Family history of stomach cancer    Family history of stomach cancer    GERD (gastroesophageal reflux disease)    Hypothyroidism    IUD (intrauterine device) in place IUD was placed in 2016   Leiomyoma of uterus    Schizophrenia ([place])    Thyroid disease    Family History  Problem Relation Age of Onset   Breast cancer Mother    Stomach cancer Maternal Grandfather    Other Maternal Aunt        died from tumor, brain?   Past Surgical History:  Procedure Laterality Date   ADJACENT TISSUE TRANSFER/TISSUE REARRANGEMENT N/A 10/29/2020   Procedure: POSSIBLE ADJACENT TISSUE TRANSFER, TISSUE REARRANGEMENT;  Surgeon: Wallace Going, DO;  Location: [facility] ORS;  Service: Plastics;  Laterality: N/A;   APPLICATION OF A-CELL OF CHEST/ABDOMEN N/A 10/29/2020   Procedure: POSSIBLE APPLICATION OF A-CELL OF CHEST/ABDOMEN;  Surgeon: Wallace Going, DO;  Location: [facility] ORS;  Service: Plastics;  Laterality: N/A;   APPLICATION OF WOUND VAC N/A 10/29/2020   Procedure: POSSIBLE APPLICATION OF WOUND VAC;   Surgeon: Wallace Going, DO;  Location: [facility] ORS;  Service: Plastics;  Laterality: N/A;   BREAST BIOPSY Left 03/2020   [facility]- Triple neg- Stage 3   PORTACATH PLACEMENT Right 05/02/2020   Procedure: INSERTION PORT-A-CATH;  Surgeon: Herbert Pun, MD;  Location: [facility] ORS;  Service: General;  Laterality: Right;   TOTAL MASTECTOMY Left 10/29/2020   Procedure: TOTAL MASTECTOMY --- RADICAL;  Surgeon: Herbert Pun, MD;  Location: [facility] ORS;  Service: General;  Laterality: Left;  needs 3 hrs   Patient Active Problem List   Diagnosis Date Noted   Breast cancer ([place]) 10/29/2020   Overweight BMI=27.7 10/08/2020   Family history of breast cancer    Family history of stomach cancer    Carcinoma of overlapping sites of left breast in female, estrogen receptor negative ([place]) 04/24/2020   Goals of care, counseling/discussion 04/24/2020   Breast mass in female    Palliative care encounter    Large mass of left breast 04/03/2020   Pre-diabetes 11/18/2019   Chronic neck and back pain 09/19/2019   Schizophrenia ([place]) 06/06/2019   Weight loss, unintentional 06/30/2017   Family history of hepatitis C 08/22/2016   History of exposure to tuberculosis 08/22/2016   Illiteracy and low-level literacy 02/13/2016   Obesity (BMI 30-39.9) 02/13/2016   Hypothyroidism 02/13/2016   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 Authorizing Provider  benztropine (COGENTIN) 0.5 MG tablet Take 1 tablet (0.5 mg total) by  mouth 2 (two) times daily. 06/08/19   Money, Lowry Ram, FNP  cetirizine (ZYRTEC) 10 MG tablet Take 1 tablet (10 mg total) by mouth daily as needed for allergies. 09/13/15   Hildred Priest, MD  clonazePAM (KLONOPIN) 0.5 MG tablet Take 0.5 mg by mouth at bedtime. 06/29/20   [provider]  diphenoxylate-atropine (LOMOTIL) 2.5-0.025 MG tablet Take 1 tablet by mouth 4 (four) times daily as needed for diarrhea or loose stools. Take it along with immodium 02/01/21   Cammie Sickle, MD  divalproex (DEPAKOTE) 500 MG DR tablet Take 1 tablet (500 mg total) by mouth every 12 (twelve) hours. 06/08/19   Money, Lowry Ram, FNP  fluticasone (FLONASE) 50 MCG/ACT nasal spray Place 1 spray into both nostrils daily. Patient taking differently: Place 1 spray into both nostrils daily as needed for allergies. 10/16/20   Cammie Sickle, MD  haloperidol (HALDOL) 5 MG tablet Take 1 tablet (5 mg total) by mouth 3 (three) times daily. 06/08/19   Money, Lowry Ram, FNP  haloperidol decanoate (HALDOL DECANOATE) 100 MG/ML injection Inject 100 mg into the muscle every 28 (twenty-eight) days.    [provider]  HYDROcodone-acetaminophen (NORCO/VICODIN) 5-325 MG tablet Take 1 tablet by mouth every 12 (twelve) hours as needed for moderate pain. 1 pill every 12 hours as needed 02/01/21   Cammie Sickle, MD  hydrOXYzine (ATARAX/VISTARIL) 25 MG tablet Take 1 tablet (25 mg total) by mouth every 8 (eight) hours as needed. 01/18/21   Cammie Sickle, MD  KLOR-CON M20 20 MEQ tablet TAKE 1 TABLET BY MOUTH TWICE A DAY 01/21/21   Cammie Sickle, MD  levothyroxine (SYNTHROID) 88 MCG tablet TAKE 1 TABLET (88 MCG TOTAL) BY MOUTH DAILY AT 6 (SIX) AM. 02/08/21   Cammie Sickle, MD  lidocaine-prilocaine (EMLA) cream Apply 1 application topically as needed. 08/29/20   Cammie Sickle, MD  medroxyPROGESTERone (DEPO-PROVERA) 150 MG/ML injection Inject 150 mg into the muscle every 3 (three) months.  07/12/20 07/07/21  [provider]  MELATONIN PO Take 1 tablet by mouth at bedtime as needed (sleep).    [provider]  ondansetron (ZOFRAN) 8 MG tablet Take 1 tablet (8 mg total) by mouth every 8 (eight) hours as needed for nausea or vomiting. 02/04/21   Cammie Sickle, MD  prochlorperazine (COMPAZINE) 10 MG tablet TAKE 1 TABLET BY MOUTH EVERY 6 HOURS AS NEEDED FOR NAUSEA OR VOMITING. 02/04/21   Cammie Sickle, MD  silver sulfADIAZINE (SILVADENE) 1 % cream Apply 1 application topically daily. 01/03/21   Thornton Park, DO  sodium chloride 1 g tablet Take 1 tablet (1 g total) by mouth 3 (three) times daily. 07/30/20   Cammie Sickle, MD    Allergies Paclitaxel    Social History Social History   Tobacco Use   Smoking status: Never   Smokeless tobacco: Never  Vaping Use   Vaping Use: Never used  Substance Use Topics   Alcohol use: No   Drug use: No    Review of Systems Patient denies headaches,  rhinorrhea, blurry vision, numbness, shortness of breath, chest pain, edema, cough, abdominal pain, nausea, vomiting, diarrhea, dysuria, fevers, rashes or hallucinations unless otherwise stated above in HPI. ____________________________________________   PHYSICAL EXAM:  VITAL SIGNS: Vitals:   02/13/21 1158  BP: 98/60  Pulse: (!) 113  Resp: 20  Temp: 98.7 F (37.1 C)  SpO2: 100%    Constitutional: Alert but encephalopathic, slow to respond to questions, protecting airway Eyes: Conjunctivae are normal.  Head: Atraumatic. Nose: No congestion/rhinnorhea. Mouth/Throat: Mucous membranes are moist.   Neck: No stridor. Painless ROM.  Cardiovascular: tachycardic, regular rhythm. Grossly normal heart sounds.  Good peripheral circulation. Respiratory: Normal respiratory effort.  No retractions. Lungs CTAB. Gastrointestinal: Soft and nontender. No distention. No abdominal bruits. No CVA tenderness. Genitourinary:  Musculoskeletal: No lower extremity  tenderness nor edema.  No joint effusions. Neurologic:  Normal speech and language. No gross focal neurologic deficits are appreciated. No facial droop Skin:  Skin is warm, dry. Extensive radiation changes to anterior chest wall Psychiatric: Mood and affect are normal. Speech and behavior are normal.  ____________________________________________   LABS (all labs ordered are listed, but only abnormal results are displayed)  Results for orders placed or performed during the hospital encounter of 02/13/21 (from the past 24 hour(s))  Lactic acid, plasma     Status: None   Collection Time: 02/13/21 12:06 PM  Result Value Ref Range   Lactic Acid, Venous 1.0 0.5 - 1.9 mmol/L  Urinalysis, Complete w Microscopic     Status: Abnormal   Collection Time: 02/13/21  2:59 PM  Result Value Ref Range   Color, Urine AMBER (A) YELLOW   APPearance HAZY (A) CLEAR   Specific Gravity, Urine 1.009 1.005 - 1.030   pH 5.0 5.0 - 8.0   Glucose, UA NEGATIVE NEGATIVE mg/dL   Hgb urine dipstick MODERATE (A) NEGATIVE   Bilirubin Urine NEGATIVE NEGATIVE   Ketones, ur NEGATIVE NEGATIVE mg/dL   Protein, ur 30 (A) NEGATIVE mg/dL   Nitrite NEGATIVE NEGATIVE   Leukocytes,Ua TRACE (A) NEGATIVE   RBC / HPF 0-5 0 - 5 RBC/hpf   WBC, UA 11-20 0 - 5 WBC/hpf   Bacteria, UA MANY (A) NONE SEEN   Squamous Epithelial / LPF 0-5 0 - 5   Mucus PRESENT   POC urine preg, ED     Status: None   Collection Time: 02/13/21  3:06 PM  Result Value Ref Range   Preg Test, Ur NEGATIVE NEGATIVE   ____________________________________________  EKG My review and personal interpretation at Time: 12:01   Indication: ams  Rate: 110  Rhythm: sinus Axis: normal Other: normal intervals, no stemi ____________________________________________  RADIOLOGY  I personally reviewed all radiographic images ordered to evaluate for the above acute complaints and reviewed radiology reports and findings.  These findings were personally discussed with the  patient.  Please see medical record for radiology report.  ____________________________________________   PROCEDURES  Procedure(s) performed:  .Critical Care Performed by: Merlyn Lot, MD Authorized by: Merlyn Lot, MD   Critical care provider statement:    Critical care time (minutes):  40   Critical care time was exclusive of:  Separately billable procedures and treating other patients   Critical care was necessary to treat or prevent imminent or life-threatening deterioration of the following conditions:  Sepsis   Critical care was time spent personally by me on the following activities:  Development of treatment plan with patient or surrogate, discussions with consultants, evaluation of patient's response to treatment, examination of  patient, obtaining history from patient or surrogate, ordering and performing treatments and interventions, ordering and review of laboratory studies, ordering and review of radiographic studies, pulse oximetry, re-evaluation of patient's condition and review of old [place] performed: yes ____________________________________________   INITIAL IMPRESSION / ASSESSMENT AND PLAN / ED COURSE  Pertinent labs & imaging results that were available during my care of the patient were reviewed by me and considered in my medical decision making (see chart for details).   DDX: sepsis, dehydration, aki, electrolyte abn, anemia, mass  Tamara [name] is a 50 y.o. who presents to the ED with presentation as described above patient is ill-appearing but vital signs have improved since being in clinic she is now tachycardic she not complaining of any shortness of breath she not hypoxic.  Does have AKI based on labs as well as leukopenia.  Broad-spectrum antibiotics ordered.  Lactate is normal.  Cultures ordered to complete sepsis work-up.  CT imaging of the brain ordered to evaluate for edema or mets.  Patient will require hospitalization for  continued IV fluids and resuscitation.    Clinical Course as of 02/13/21 1551  Wed Feb 13, 2021  1546 There has been a delay in initiating antibiotics due to delay in accessing port and poor vascular access.  14 patient is afebrile her initial lactate is normal.  Does have many bacteria in her urine.  Will consult hospitalist for admission. [PR]    Clinical Course User Index [PR] Merlyn Lot, MD    The patient was evaluated in Emergency Department today for the symptoms described in the history of present illness. He/she was evaluated in the context of the global COVID-19 pandemic, which necessitated consideration that the patient might be at risk for infection with the SARS-CoV-2 virus that causes COVID-19. Institutional protocols and algorithms that pertain to the evaluation of patients at risk for COVID-19 are in a state of rapid change based on information released by regulatory bodies including the CDC and federal and state organizations. These policies and algorithms were followed during the patient's care in the ED.  As part of my medical decision making, I reviewed the following data within the [place] notes reviewed and incorporated, Labs reviewed, notes from prior ED visits and [place] Controlled Substance Database   ____________________________________________   FINAL CLINICAL IMPRESSION(S) / ED DIAGNOSES  Final diagnoses:  Delirium  Sepsis with encephalopathy without septic shock, due to unspecified organism [facility])      NEW MEDICATIONS STARTED DURING THIS VISIT:  New Prescriptions   No medications on file     Note:  This document was prepared using Dragon voice recognition software and may include unintentional dictation errors.    Merlyn Lot, MD 02/13/21 [phone]

## 2021-02-13 NOTE — ED Notes (Addendum)
(  Sister-) Earline [place] [phone] & (Mother- confirmed legal guardian) Shanzay Hepworth [phone] were called on 3 way to given verbal authorization to administer the blood transfusion as ordered. Entire Blood Product Consent form was verbally read to both parties simultaneously- and both were given the opportunity to ask questions to this RN. No questions or concerns were posed to this RN.   Mother Donnice Nielsen gave verbal authorization to proceed with blood transfusion. MD Cox notified of delay of administration related to consent. This RN signed as a witness with RN Katie who was present during phone call.

## 2021-02-13 NOTE — Progress Notes (Signed)
Pt transported to ED per verbal order from Billey Chang, NP. Report given to triage RN.

## 2021-02-13 NOTE — ED Notes (Signed)
RN unable to get second blood cultures due to pt having poor vascular.

## 2021-02-13 NOTE — Progress Notes (Signed)
Patient found to be lethargic.  Labs consistent with ARF and neutropenia.  Decision made to transfer patient to the ER for care. Concern for possible sepsis given neutropenia, tachycardia, and hypotension.

## 2021-02-14 ENCOUNTER — Other Ambulatory Visit: Payer: Self-pay | Admitting: *Deleted

## 2021-02-14 ENCOUNTER — Ambulatory Visit: Payer: Medicaid Other

## 2021-02-14 ENCOUNTER — Other Ambulatory Visit: Payer: Self-pay | Admitting: Internal Medicine

## 2021-02-14 DIAGNOSIS — A419 Sepsis, unspecified organism: Secondary | ICD-10-CM | POA: Diagnosis not present

## 2021-02-14 DIAGNOSIS — E876 Hypokalemia: Secondary | ICD-10-CM

## 2021-02-14 DIAGNOSIS — N179 Acute kidney failure, unspecified: Secondary | ICD-10-CM

## 2021-02-14 DIAGNOSIS — R6520 Severe sepsis without septic shock: Secondary | ICD-10-CM | POA: Diagnosis not present

## 2021-02-14 DIAGNOSIS — D61818 Other pancytopenia: Secondary | ICD-10-CM | POA: Diagnosis present

## 2021-02-14 DIAGNOSIS — R7881 Bacteremia: Secondary | ICD-10-CM | POA: Diagnosis not present

## 2021-02-14 DIAGNOSIS — Z9189 Other specified personal risk factors, not elsewhere classified: Secondary | ICD-10-CM | POA: Diagnosis not present

## 2021-02-14 DIAGNOSIS — F200 Paranoid schizophrenia: Secondary | ICD-10-CM | POA: Diagnosis present

## 2021-02-14 DIAGNOSIS — Z79899 Other long term (current) drug therapy: Secondary | ICD-10-CM | POA: Diagnosis not present

## 2021-02-14 DIAGNOSIS — Z888 Allergy status to other drugs, medicaments and biological substances status: Secondary | ICD-10-CM | POA: Diagnosis not present

## 2021-02-14 DIAGNOSIS — A4152 Sepsis due to Pseudomonas: Secondary | ICD-10-CM | POA: Diagnosis present

## 2021-02-14 DIAGNOSIS — Z79890 Hormone replacement therapy (postmenopausal): Secondary | ICD-10-CM | POA: Diagnosis not present

## 2021-02-14 DIAGNOSIS — F319 Bipolar disorder, unspecified: Secondary | ICD-10-CM

## 2021-02-14 DIAGNOSIS — Z20822 Contact with and (suspected) exposure to covid-19: Secondary | ICD-10-CM | POA: Diagnosis present

## 2021-02-14 DIAGNOSIS — K219 Gastro-esophageal reflux disease without esophagitis: Secondary | ICD-10-CM | POA: Diagnosis present

## 2021-02-14 DIAGNOSIS — N390 Urinary tract infection, site not specified: Secondary | ICD-10-CM | POA: Diagnosis present

## 2021-02-14 DIAGNOSIS — G9341 Metabolic encephalopathy: Secondary | ICD-10-CM | POA: Diagnosis present

## 2021-02-14 DIAGNOSIS — Z66 Do not resuscitate: Secondary | ICD-10-CM | POA: Diagnosis present

## 2021-02-14 DIAGNOSIS — R87610 Atypical squamous cells of undetermined significance on cytologic smear of cervix (ASC-US): Secondary | ICD-10-CM | POA: Diagnosis not present

## 2021-02-14 DIAGNOSIS — E039 Hypothyroidism, unspecified: Secondary | ICD-10-CM | POA: Diagnosis present

## 2021-02-14 DIAGNOSIS — E222 Syndrome of inappropriate secretion of antidiuretic hormone: Secondary | ICD-10-CM

## 2021-02-14 DIAGNOSIS — A045 Campylobacter enteritis: Secondary | ICD-10-CM | POA: Diagnosis present

## 2021-02-14 DIAGNOSIS — F819 Developmental disorder of scholastic skills, unspecified: Secondary | ICD-10-CM | POA: Diagnosis present

## 2021-02-14 DIAGNOSIS — C50812 Malignant neoplasm of overlapping sites of left female breast: Secondary | ICD-10-CM | POA: Diagnosis present

## 2021-02-14 DIAGNOSIS — R4182 Altered mental status, unspecified: Secondary | ICD-10-CM | POA: Diagnosis present

## 2021-02-14 DIAGNOSIS — Z171 Estrogen receptor negative status [ER-]: Secondary | ICD-10-CM

## 2021-02-14 DIAGNOSIS — D6481 Anemia due to antineoplastic chemotherapy: Secondary | ICD-10-CM | POA: Diagnosis present

## 2021-02-14 DIAGNOSIS — R531 Weakness: Secondary | ICD-10-CM

## 2021-02-14 DIAGNOSIS — B965 Pseudomonas (aeruginosa) (mallei) (pseudomallei) as the cause of diseases classified elsewhere: Secondary | ICD-10-CM

## 2021-02-14 DIAGNOSIS — Z9012 Acquired absence of left breast and nipple: Secondary | ICD-10-CM | POA: Diagnosis not present

## 2021-02-14 DIAGNOSIS — Z803 Family history of malignant neoplasm of breast: Secondary | ICD-10-CM | POA: Diagnosis not present

## 2021-02-14 DIAGNOSIS — Z975 Presence of (intrauterine) contraceptive device: Secondary | ICD-10-CM | POA: Diagnosis not present

## 2021-02-14 DIAGNOSIS — Z800 Family history of malignant neoplasm of digestive organs: Secondary | ICD-10-CM | POA: Diagnosis not present

## 2021-02-14 LAB — GASTROINTESTINAL PANEL BY PCR, STOOL (REPLACES STOOL CULTURE)

## 2021-02-14 LAB — C DIFFICILE QUICK SCREEN W PCR REFLEX
C Diff antigen: NEGATIVE
C Diff interpretation: NOT DETECTED
C Diff toxin: NEGATIVE

## 2021-02-14 LAB — BLOOD CULTURE ID PANEL (REFLEXED) - BCID2

## 2021-02-14 LAB — TYPE AND SCREEN
ABO/RH(D): O POS
Antibody Screen: NEGATIVE
Unit division: 0

## 2021-02-14 LAB — BASIC METABOLIC PANEL
Anion gap: 9 (ref 5–15)
BUN: 21 mg/dL — ABNORMAL HIGH (ref 6–20)
CO2: 21 mmol/L — ABNORMAL LOW (ref 22–32)
Calcium: 8.3 mg/dL — ABNORMAL LOW (ref 8.9–10.3)
Chloride: 95 mmol/L — ABNORMAL LOW (ref 98–111)
Creatinine, Ser: 1.05 mg/dL — ABNORMAL HIGH (ref 0.44–1.00)
GFR, Estimated: >60 mL/min (ref >60)
Glucose, Bld: 110 mg/dL — ABNORMAL HIGH (ref 70–99)
Potassium: 3.7 mmol/L (ref 3.5–5.1)
Sodium: 125 mmol/L — ABNORMAL LOW (ref 135–145)

## 2021-02-14 LAB — ABO/RH: ABO/RH(D): O POS

## 2021-02-14 LAB — BPAM RBC
Blood Product Expiration Date: 202210262359
ISSUE DATE / TIME: 202209212348
Unit Type and Rh: 5100

## 2021-02-14 LAB — CBC WITH DIFFERENTIAL/PLATELET
Abs Immature Granulocytes: 0 10*3/uL (ref 0.00–0.07)
Basophils Absolute: 0 10*3/uL (ref 0.0–0.1)
Basophils Relative: 2 %
Eosinophils Absolute: 0 10*3/uL (ref 0.0–0.5)
Eosinophils Relative: 2 %
HCT: 20.3 % — ABNORMAL LOW (ref 36.0–46.0)
Hemoglobin: 7.2 g/dL — ABNORMAL LOW (ref 12.0–15.0)
Immature Granulocytes: 0 %
Lymphocytes Relative: 5 %
Lymphs Abs: 0 10*3/uL — ABNORMAL LOW (ref 0.7–4.0)
MCH: 30.3 pg (ref 26.0–34.0)
MCHC: 35.5 g/dL (ref 30.0–36.0)
MCV: 85.3 fL (ref 80.0–100.0)
Monocytes Absolute: 0.1 10*3/uL (ref 0.1–1.0)
Monocytes Relative: 8 %
Neutro Abs: 0.7 10*3/uL — ABNORMAL LOW (ref 1.7–7.7)
Neutrophils Relative %: 83 %
Platelets: 71 10*3/uL — ABNORMAL LOW (ref 150–400)
RBC: 2.38 MIL/uL — ABNORMAL LOW (ref 3.87–5.11)
RDW: 14.6 % (ref 11.5–15.5)
Smear Review: NORMAL
WBC: 0.8 10*3/uL — CL (ref 4.0–10.5)
nRBC: 0 % (ref 0.0–0.2)

## 2021-02-14 LAB — GLUCOSE, CAPILLARY: Glucose-Capillary: 94 mg/dL (ref 70–99)

## 2021-02-14 LAB — PROTIME-INR
INR: 1.4 — ABNORMAL HIGH (ref 0.8–1.2)
Prothrombin Time: 16.8 seconds — ABNORMAL HIGH (ref 11.4–15.2)

## 2021-02-14 LAB — PHOSPHORUS: Phosphorus: 3.2 mg/dL (ref 2.5–4.6)

## 2021-02-14 LAB — PATHOLOGIST SMEAR REVIEW

## 2021-02-14 LAB — MAGNESIUM: Magnesium: 3.3 mg/dL — ABNORMAL HIGH (ref 1.7–2.4)

## 2021-02-14 LAB — CORTISOL-AM, BLOOD: Cortisol - AM: 22.5 ug/dL (ref 6.7–22.6)

## 2021-02-14 LAB — PROCALCITONIN: Procalcitonin: 5.42 ng/mL

## 2021-02-14 MED ORDER — MAGNESIUM SULFATE 2 GM/50ML IV SOLN
2.0000 g | Freq: Once | INTRAVENOUS | Status: AC
  Administered 2021-02-14: 2 g via INTRAVENOUS
  Filled 2021-02-14: qty 50

## 2021-02-14 MED ORDER — SODIUM CHLORIDE 0.9 % IV SOLN
2.0000 g | Freq: Two times a day (BID) | INTRAVENOUS | Status: DC
  Administered 2021-02-14 – 2021-02-15 (×3): 2 g via INTRAVENOUS
  Filled 2021-02-14 (×4): qty 2

## 2021-02-14 MED ORDER — ZINC OXIDE 40 % EX OINT
TOPICAL_OINTMENT | CUTANEOUS | Status: DC | PRN
  Filled 2021-02-14: qty 113

## 2021-02-14 MED ORDER — LOPERAMIDE HCL 2 MG PO CAPS: 4.0000 mg | ORAL_CAPSULE | Freq: Once | ORAL | Status: DC

## 2021-02-14 MED ORDER — POTASSIUM CHLORIDE 10 MEQ/100ML IV SOLN
10.0000 meq | INTRAVENOUS | Status: AC
  Administered 2021-02-14 (×4): 10 meq via INTRAVENOUS
  Filled 2021-02-14 (×5): qty 100

## 2021-02-14 MED ORDER — SODIUM CHLORIDE 0.9 % IV BOLUS: 500.0000 mL | Freq: Once | INTRAVENOUS | Status: DC

## 2021-02-14 MED ORDER — VANCOMYCIN HCL IN DEXTROSE 1-5 GM/200ML-% IV SOLN
1000.0000 mg | INTRAVENOUS | Status: DC
  Filled 2021-02-14: qty 200

## 2021-02-14 MED ORDER — CHLORHEXIDINE GLUCONATE CLOTH 2 % EX PADS
6.0000 | MEDICATED_PAD | Freq: Every day | CUTANEOUS | Status: DC
  Administered 2021-02-14 – 2021-02-15 (×2): 6 via TOPICAL

## 2021-02-14 MED ORDER — DIPHENOXYLATE-ATROPINE 2.5-0.025 MG PO TABS
1.0000 | ORAL_TABLET | Freq: Four times a day (QID) | ORAL | Status: DC | PRN
  Administered 2021-02-14 – 2021-02-15 (×2): 1 via ORAL
  Filled 2021-02-14 (×2): qty 1

## 2021-02-14 MED ORDER — AZITHROMYCIN 500 MG PO TABS
500.0000 mg | ORAL_TABLET | Freq: Every day | ORAL | Status: DC
  Administered 2021-02-15: 05:00:00 500 mg via ORAL
  Filled 2021-02-14 (×2): qty 1

## 2021-02-14 MED ORDER — SODIUM CHLORIDE 0.9 % IV SOLN: INTRAVENOUS | Status: DC

## 2021-02-14 NOTE — Consult Note (Addendum)
NAME: Tamara [name]  DOB: 1970/12/30  MRN: [id number]  Date/Time: 02/14/2021 2:22 PM  REQUESTING PROVIDER: Dr.Pokhrel Subjective:  REASON FOR CONSULT: pseudomonas bacteremia, campylobacter infection ?pt is a poor historian- chart reviewed Tamara [name] is a 50 y.o. 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 Underwent needle biopsy of rt breast on 01/31/21 and it showed poorly differentiated carcinoma similar to the left breast Started Trodelvy on 02/01/21, 2nd treatment on 02/08/21. Pt went to oncology center yesterday and was weak, very lethargic and was sent to the ED. Had ARF and neutropenia which prompted them to send her to ED as well  In the ED Temp 98.7  Labs showed Na 125, K 3.2, cr 2.94 ( baseline 0.50), Wbc 0.8 , HB 6.9, PLT 80 Blood culture sent Stool sent for diarrhea Started on broad spectrum antibiotic coverage Campylobacter positive I have been asked to see patient for pseudomonas bacteremia and campylobacter diarrhea    Past Medical History:  Diagnosis Date   Breast cancer ([place]) 03/2020   left   Family history of breast cancer    Family history of stomach cancer    Family history of stomach cancer    GERD (gastroesophageal reflux disease)    Hypothyroidism    IUD (intrauterine device) in place IUD was placed in 2016   Leiomyoma of uterus    Schizophrenia ([place])    Thyroid disease     Past Surgical History:  Procedure Laterality Date   ADJACENT TISSUE TRANSFER/TISSUE REARRANGEMENT N/A 10/29/2020   Procedure: POSSIBLE ADJACENT TISSUE  TRANSFER, TISSUE REARRANGEMENT;  Surgeon: Wallace Going, DO;  Location: [facility] OR[facility];  Service: Plastics;  Laterality: N/A;   APPLICATION OF A-CELL OF CHEST/ABDOMEN N/A 10/29/2020   Procedure: POSSIBLE APPLICATION OF A-CELL OF CHEST/ABDOMEN;  Surgeon: Wallace Going, DO;  Location: [facility];  Service: Plastics;  Laterality: N/A;   APPLICATION OF WOUND VAC N/A 10/29/2020   Procedure: POSSIBLE APPLICATION OF WOUND VAC;  Surgeon: Wallace Going, DO;  Location: [facility];  Service: Plastics;  Laterality: N/A;   BREAST BIOPSY Left 03/2020   [facility]- Triple neg- Stage 3   PORTACATH PLACEMENT Right 05/02/2020   Procedure: INSERTION PORT-A-CATH;  Surgeon: Herbert Pun, MD;  Location: [facility];  Service: General;  Laterality: Right;   TOTAL MASTECTOMY Left 10/29/2020   Procedure: TOTAL MASTECTOMY --- RADICAL;  Surgeon: Herbert Pun, MD;  Location: [facility];  Service: General;  Laterality: Left;  needs 3 hrs    Social History   Socioeconomic History   Marital status: Single    Spouse name: Not on file   Number of children: Not on file   Years of education: Not on file   Highest education level: Not on file  Occupational History   Not on file  Tobacco Use   Smoking status: Never   Smokeless tobacco: Never  Vaping Use   Vaping Use: Never used  Substance and Sexual Activity   Alcohol use: No   Drug use: No   Sexual activity: Yes    Partners: Male    Birth control/protection: Surgical, Injection  Other Topics Concern   Not on  file  Social History Narrative   Not on file   Social Determinants of Health   Financial Resource Strain: Not on file  Food Insecurity: Not on file  Transportation Needs: Not on file  Physical Activity: Not on file  Stress: Not on file  Social Connections: Not on file  Intimate Partner Violence: Not on file    Family History  Problem Relation Age of Onset   Breast cancer Mother    Stomach cancer Maternal Grandfather    Other Maternal  Aunt        died from tumor, brain?   Allergies  Allergen Reactions   Paclitaxel Anaphylaxis    Hypersensitivity reaction [flushing; shortness of breath; drop in blood pressure; tachycardia]   I? Current Facility-Administered Medications  Medication Dose Route Frequency Provider Last Rate Last Admin   0.9 %  sodium chloride infusion   Intravenous Continuous Sharion Settler, NP 150 mL/hr at 02/14/21 0227 New Bag at 02/14/21 0227   acetaminophen (TYLENOL) tablet 650 mg  650 mg Oral Q6H PRN Cox, Amy N, DO       Or   acetaminophen (TYLENOL) suppository 650 mg  650 mg Rectal Q6H PRN Cox, Amy N, DO       benztropine (COGENTIN) tablet 0.5 mg  0.5 mg Oral BID Cox, Amy N, DO   0.5 mg at 02/14/21 1151   ceFEPIme (MAXIPIME) 2 g in sodium chloride 0.9 % 100 mL IVPB  2 g Intravenous Q12H Pokhrel, Laxman, MD   Stopped at 02/14/21 1221   clonazePAM (KLONOPIN) tablet 0.5 mg  0.5 mg Oral QHS Cox, Amy N, DO   0.5 mg at 02/14/21 0043   divalproex (DEPAKOTE) DR tablet 500 mg  500 mg Oral Q12H Cox, Amy N, DO   500 mg at 02/14/21 1152   fluticasone (FLONASE) 50 MCG/ACT nasal spray 1 spray  1 spray Each Nare Daily PRN Cox, Amy N, DO       haloperidol (HALDOL) tablet 5 mg  5 mg Oral BID Cox, Amy N, DO   5 mg at 02/14/21 1153   haloperidol decanoate (HALDOL DECANOATE) 100 MG/ML injection 100 mg  100 mg Intramuscular Q28 days Cox, Amy N, DO       HYDROcodone-acetaminophen (NORCO/VICODIN) 5-325 MG per tablet 1 tablet  1 tablet Oral Q12H PRN Cox, Amy N, DO       hydrOXYzine (ATARAX/VISTARIL) tablet 25 mg  25 mg Oral Q8H PRN Cox, Amy N, DO       levothyroxine (SYNTHROID) tablet 88 mcg  88 mcg Oral Q0600 Cox, Amy N, DO   88 mcg at 02/14/21 0800   melatonin tablet 2.5 mg  2.5 mg Oral QHS PRN Cox, Amy N, DO       metroNIDAZOLE (FLAGYL) IVPB 500 mg  500 mg Intravenous Q12H Cox, Amy N, DO   Stopped at 02/14/21 1253   ondansetron (ZOFRAN) tablet 4 mg  4 mg Oral Q6H PRN Cox, Amy N, DO       Or   ondansetron (ZOFRAN)  injection 4 mg  4 mg Intravenous Q6H PRN Cox, Amy N, DO       sodium chloride 0.9 % bolus 500 mL  500 mL Intravenous Once Sharion Settler, NP   Held at 02/14/21 0228   [START ON 02/15/2021] vancomycin (VANCOCIN) IVPB 1000 mg/200 mL premix  1,000 mg Intravenous Q24H Pokhrel, Laxman, MD       vancomycin variable dose per unstable renal function (pharmacist dosing)   Does not  apply See admin instructions Cox, Amy N, DO       Current Outpatient Medications  Medication Sig Dispense Refill   benztropine (COGENTIN) 0.5 MG tablet Take 1 tablet (0.5 mg total) by mouth 2 (two) times daily. 60 tablet 1   cetirizine (ZYRTEC) 10 MG tablet Take 1 tablet (10 mg total) by mouth daily as needed for allergies.     clonazePAM (KLONOPIN) 0.5 MG tablet Take 0.5 mg by mouth at bedtime.     diphenoxylate-atropine (LOMOTIL) 2.5-0.025 MG tablet Take 1 tablet by mouth 4 (four) times daily as needed for diarrhea or loose stools. Take it along with immodium 60 tablet 0   divalproex (DEPAKOTE) 500 MG DR tablet Take 1 tablet (500 mg total) by mouth every 12 (twelve) hours. 60 tablet 1   fluticasone (FLONASE) 50 MCG/ACT nasal spray Place 1 spray into both nostrils daily. (Patient taking differently: Place 1 spray into both nostrils daily as needed for allergies.) 11.1 mL 9   haloperidol (HALDOL) 5 MG tablet Take 1 tablet (5 mg total) by mouth 3 (three) times daily. (Patient taking differently: Take 5 mg by mouth 2 (two) times daily.) 90 tablet 1   haloperidol decanoate (HALDOL DECANOATE) 100 MG/ML injection Inject 100 mg into the muscle every 28 (twenty-eight) days.     HYDROcodone-acetaminophen (NORCO/VICODIN) 5-325 MG tablet Take 1 tablet by mouth every 12 (twelve) hours as needed for moderate pain. 1 pill every 12 hours as needed 45 tablet 0   hydrOXYzine (ATARAX/VISTARIL) 25 MG tablet Take 1 tablet (25 mg total) by mouth every 8 (eight) hours as needed. 90 tablet 3   KLOR-CON M20 20 MEQ tablet TAKE 1 TABLET BY MOUTH TWICE A  DAY 60 tablet 0   levothyroxine (SYNTHROID) 88 MCG tablet TAKE 1 TABLET (88 MCG TOTAL) BY MOUTH DAILY AT 6 (SIX) AM. 30 tablet 3   lidocaine-prilocaine (EMLA) cream Apply 1 application topically as needed. 30 g 3   medroxyPROGESTERone (DEPO-PROVERA) 150 MG/ML injection Inject 150 mg into the muscle every 3 (three) months.     MELATONIN PO Take 1 tablet by mouth at bedtime as needed (sleep).     ondansetron (ZOFRAN) 8 MG tablet Take 1 tablet (8 mg total) by mouth every 8 (eight) hours as needed for nausea or vomiting. 30 tablet 0   prochlorperazine (COMPAZINE) 10 MG tablet TAKE 1 TABLET BY MOUTH EVERY 6 HOURS AS NEEDED FOR NAUSEA OR VOMITING. 40 tablet 1   silver sulfADIAZINE (SILVADENE) 1 % cream Apply 1 application topically daily. (Patient not taking: Reported on 02/13/2021) 50 g 1   sodium chloride 1 g tablet Take 1 tablet (1 g total) by mouth 3 (three) times daily. (Patient not taking: Reported on 02/13/2021) 90 tablet 0   Facility-Administered Medications Ordered in Other Encounters  Medication Dose Route Frequency Provider Last Rate Last Admin   sodium chloride flush (NS) 0.9 % injection 10 mL  10 mL Intravenous PRN Cammie Sickle, MD   10 mL at 06/14/20 0840     Abtx:  Anti-infectives (From admission, onward)    Start     Dose/Rate Route Frequency Ordered Stop   02/15/21 0100  vancomycin (VANCOCIN) IVPB 1000 mg/200 mL premix        1,000 mg 200 mL/hr over 60 Minutes Intravenous Every 24 hours 02/14/21 1148     02/14/21 1600  ceFEPIme (MAXIPIME) 2 g in sodium chloride 0.9 % 100 mL IVPB  Status:  Discontinued  2 g 200 mL/hr over 30 Minutes Intravenous Every 24 hours 02/13/21 2202 02/14/21 1130   02/14/21 1145  ceFEPIme (MAXIPIME) 2 g in sodium chloride 0.9 % 100 mL IVPB        2 g 200 mL/hr over 30 Minutes Intravenous Every 12 hours 02/14/21 1130     02/13/21 2300  metroNIDAZOLE (FLAGYL) IVPB 500 mg        500 mg 100 mL/hr over 60 Minutes Intravenous Every 12 hours  02/13/21 2138     02/13/21 2300  vancomycin (VANCOREADY) IVPB 500 mg/100 mL        500 mg 100 mL/hr over 60 Minutes Intravenous  Once 02/13/21 2157 02/14/21 0313   02/13/21 2211  vancomycin variable dose per unstable renal function (pharmacist dosing)         Does not apply See admin instructions 02/13/21 2211     02/13/21 1415  ceFEPIme (MAXIPIME) 2 g in sodium chloride 0.9 % 100 mL IVPB        2 g 200 mL/hr over 30 Minutes Intravenous  Once 02/13/21 1414 02/13/21 1632   02/13/21 1415  metroNIDAZOLE (FLAGYL) IVPB 500 mg  Status:  Discontinued        500 mg 100 mL/hr over 60 Minutes Intravenous  Once 02/13/21 1414 02/13/21 2205   02/13/21 1415  vancomycin (VANCOCIN) IVPB 1000 mg/200 mL premix        1,000 mg 200 mL/hr over 60 Minutes Intravenous  Once 02/13/21 1414 02/13/21 1734       REVIEW OF SYSTEMS:  NA Objective:  VITALS:  BP 104/65   Pulse 97   Temp (!) 97.5 F (36.4 C)   Resp 18   Ht 5\' 2"  (1.575 m)   Wt 65.5 kg   SpO2 100%   BMI 26.39 kg/m  PHYSICAL EXAM:  General: Awake, incomprehensible speech, oriented in person Head: Normocephalic, without obvious abnormality, atraumatic. Eyes: Conjunctivae clear, anicteric sclerae. Pupils are equal ENT Nares normal. No drainage or sinus tenderness. Lips, mucosa, and tongue normal. No Thrush Poor dentition Neck: , symmetrical, no adenopathy, thyroid: non tender no carotid bruit and no JVD. Back: did not examine Lungs: b/l air entry Heart: Regular rate and rhythm, no murmur, rub or gallop. Abdomen: Soft, non-tender,not distended. Bowel sounds normal. No masses Extremities: atraumatic, no cyanosis. No edema. No clubbing Skin: No rashes or lesions. Or bruising Lymph: Cervical, supraclavicular normal. Neurologic: Grossly non-focal Breasts were not examined- but pictures reviewed PORT rt side of chest Pertinent Labs Lab Results CBC    Component Value Date/Time   WBC 0.8 (LL) 02/14/2021 0448   RBC 2.38 (L) 02/14/2021  0448   HGB 7.2 (L) 02/14/2021 0448   HGB 13.4 11/11/2013 2142   HCT 20.3 (L) 02/14/2021 0448   HCT 39.3 11/11/2013 2142   PLT 71 (L) 02/14/2021 0448   PLT 295 11/11/2013 2142   MCV 85.3 02/14/2021 0448   MCV 90 11/11/2013 2142   MCH 30.3 02/14/2021 0448   MCHC 35.5 02/14/2021 0448   RDW 14.6 02/14/2021 0448   RDW 13.8 11/11/2013 2142   LYMPHSABS 0.0 (L) 02/14/2021 0448   MONOABS 0.1 02/14/2021 0448   EOSABS 0.0 02/14/2021 0448   BASOSABS 0.0 02/14/2021 0448    CMP Latest Ref Rng & Units 02/14/2021 02/13/2021 02/08/2021  Glucose 70 - 99 mg/dL 110(H) 100(H) 98  BUN 6 - 20 mg/dL 21(H) 38(H) <5(L)  Creatinine 0.44 - 1.00 mg/dL 1.05(H) 2.94(H) 0.50  Sodium 135 - 145 mmol/L 125(L)  125(L) 127(L)  Potassium 3.5 - 5.1 mmol/L 3.7 3.2(L) 3.3(L)  Chloride 98 - 111 mmol/L 95(L) 89(L) 89(L)  CO2 22 - 32 mmol/L 21(L) 21(L) 25  Calcium 8.9 - 10.3 mg/dL 8.3(L) 8.6(L) 9.0  Total Protein 6.5 - 8.1 g/dL - 7.5 7.3  Total Bilirubin 0.3 - 1.2 mg/dL - 1.0 0.8  Alkaline Phos 38 - 126 U/L - 59 74  AST 15 - 41 U/L - 15 13(L)  ALT 0 - 44 U/L - 16 11      Microbiology: Recent Results (from the past 240 hour(s))  Urine Culture     Status: Abnormal (Preliminary result)   Collection Time: 02/13/21  2:59 PM   Specimen: Urine, Clean Catch  Result Value Ref Range Status   Specimen Description   Final    URINE, CLEAN CATCH Performed at [facility], [address] [place], [place] [address]    Special Requests   Final    NONE Performed at [facility], [address] [place], [place] [address]    Culture (A)  Final    >=100,000 COLONIES/mL GRAM NEGATIVE RODS CULTURE REINCUBATED FOR BETTER GROWTH Performed at [facility], [place] [address], [place], [place] [address]    Report Status PENDING  Incomplete  Blood Culture (routine x 2)     Status: None (Preliminary result)   Collection Time: 02/13/21  3:50 PM   Specimen: BLOOD  Result Value Ref Range Status   Specimen  Description BLOOD PORTA CATH  Final   Special Requests   Final    BOTTLES DRAWN AEROBIC AND ANAEROBIC Blood Culture adequate volume   Culture  Setup Time   Final    AEROBIC BOTTLE ONLY GRAM NEGATIVE RODS Organism ID to follow CRITICAL RESULT CALLED TO, READ BACK BY AND VERIFIED WITH: SUSAN WATSON 02/14/21 1054 KLW Performed at [facility], [address] [place], [place] [address]    Culture GRAM NEGATIVE RODS  Final   Report Status PENDING  Incomplete  Blood Culture ID Panel (Reflexed)     Status: Abnormal   Collection Time: 02/13/21  3:50 PM  Result Value Ref Range Status   Enterococcus faecalis NOT DETECTED NOT DETECTED Final   Enterococcus Faecium NOT DETECTED NOT DETECTED Final   Listeria monocytogenes NOT DETECTED NOT DETECTED Final   Staphylococcus species NOT DETECTED NOT DETECTED Final   Staphylococcus aureus (BCID) NOT DETECTED NOT DETECTED Final   Staphylococcus epidermidis NOT DETECTED NOT DETECTED Final   Staphylococcus lugdunensis NOT DETECTED NOT DETECTED Final   Streptococcus species NOT DETECTED NOT DETECTED Final   Streptococcus agalactiae NOT DETECTED NOT DETECTED Final   Streptococcus pneumoniae NOT DETECTED NOT DETECTED Final   Streptococcus pyogenes NOT DETECTED NOT DETECTED Final   A.calcoaceticus-baumannii NOT DETECTED NOT DETECTED Final   Bacteroides fragilis NOT DETECTED NOT DETECTED Final   Enterobacterales NOT DETECTED NOT DETECTED Final   Enterobacter cloacae complex NOT DETECTED NOT DETECTED Final   Escherichia coli NOT DETECTED NOT DETECTED Final   Klebsiella aerogenes NOT DETECTED NOT DETECTED Final   Klebsiella oxytoca NOT DETECTED NOT DETECTED Final   Klebsiella pneumoniae NOT DETECTED NOT DETECTED Final   Proteus species NOT DETECTED NOT DETECTED Final   Salmonella species NOT DETECTED NOT DETECTED Final   Serratia marcescens NOT DETECTED NOT DETECTED Final   Haemophilus influenzae NOT DETECTED NOT DETECTED Final   Neisseria  meningitidis NOT DETECTED NOT DETECTED Final   Pseudomonas aeruginosa DETECTED (A) NOT DETECTED Final  Comment: CRITICAL RESULT CALLED TO, READ BACK BY AND VERIFIED WITH: SUSAN WATSON 02/14/21 1054 KLW    Stenotrophomonas maltophilia NOT DETECTED NOT DETECTED Final   Candida albicans NOT DETECTED NOT DETECTED Final   Candida auris NOT DETECTED NOT DETECTED Final   Candida glabrata NOT DETECTED NOT DETECTED Final   Candida krusei NOT DETECTED NOT DETECTED Final   Candida parapsilosis NOT DETECTED NOT DETECTED Final   Candida tropicalis NOT DETECTED NOT DETECTED Final   Cryptococcus neoformans/gattii NOT DETECTED NOT DETECTED Final   CTX-M ESBL NOT DETECTED NOT DETECTED Final   Carbapenem resistance IMP NOT DETECTED NOT DETECTED Final   Carbapenem resistance KPC NOT DETECTED NOT DETECTED Final   Carbapenem resistance NDM NOT DETECTED NOT DETECTED Final   Carbapenem resistance VIM NOT DETECTED NOT DETECTED Final    Comment: Performed at [facility], [address] [place], [place] [address]  Resp Panel by RT-PCR (Flu A&B, Covid) Nasopharyngeal Swab     Status: None   Collection Time: 02/13/21  4:45 PM   Specimen: Nasopharyngeal Swab; Nasopharyngeal(NP) swabs in vial transport medium  Result Value Ref Range Status   SARS Coronavirus 2 by RT PCR NEGATIVE NEGATIVE Final    Comment: (NOTE) SARS-CoV-2 target nucleic acids are NOT DETECTED.  The SARS-CoV-2 RNA is generally detectable in upper respiratory specimens during the acute phase of infection. The lowest concentration of SARS-CoV-2 viral copies this assay can detect is 138 copies/mL. A negative result does not preclude SARS-Cov-2 infection and should not be used as the sole basis for treatment or other patient management decisions. A negative result Tamara occur with  improper specimen collection/handling, submission of specimen other than nasopharyngeal swab, presence of viral mutation(s) within the areas targeted by this  assay, and inadequate number of viral copies(<138 copies/mL). A negative result must be combined with clinical observations, patient history, and epidemiological information. The expected result is Negative.  Fact Sheet for Patients:  EntrepreneurPulse.com.au  Fact Sheet for Healthcare Providers:  IncredibleEmployment.be  This test is no t yet approved or cleared by the [place] FDA and  has been authorized for detection and/or diagnosis of SARS-CoV-2 by FDA under an Emergency Use Authorization (EUA). This EUA will remain  in effect (meaning this test can be used) for the duration of the COVID-19 declaration under Section 564(b)(1) of the Act, 21 U.S.C.section 360bbb-3(b)(1), unless the authorization is terminated  or revoked sooner.       Influenza A by PCR NEGATIVE NEGATIVE Final   Influenza B by PCR NEGATIVE NEGATIVE Final    Comment: (NOTE) The Xpert Xpress SARS-CoV-2/FLU/RSV plus assay is intended as an aid in the diagnosis of influenza from Nasopharyngeal swab specimens and should not be used as a sole basis for treatment. Nasal washings and aspirates are unacceptable for Xpert Xpress SARS-CoV-2/FLU/RSV testing.  Fact Sheet for Patients: EntrepreneurPulse.com.au  Fact Sheet for Healthcare Providers: IncredibleEmployment.be  This test is not yet approved or cleared by the [place] FDA and has been authorized for detection and/or diagnosis of SARS-CoV-2 by FDA under an Emergency Use Authorization (EUA). This EUA will remain in effect (meaning this test can be used) for the duration of the COVID-19 declaration under Section 564(b)(1) of the Act, 21 U.S.C. section 360bbb-3(b)(1), unless the authorization is terminated or revoked.  Performed at [facility], [address], [place], [place] [address]   C Difficile Quick Screen w PCR reflex     Status: None   Collection Time:  02/14/21 12:08  AM   Specimen: STOOL  Result Value Ref Range Status   C Diff antigen NEGATIVE NEGATIVE Final   C Diff toxin NEGATIVE NEGATIVE Final   C Diff interpretation No C. difficile detected.  Final    Comment: Performed at [facility], [address] [place], [place] [address]  Gastrointestinal Panel by PCR , Stool     Status: Abnormal   Collection Time: 02/14/21 12:08 AM   Specimen: STOOL  Result Value Ref Range Status   Campylobacter species DETECTED (A) NOT DETECTED Final    Comment: RESULT CALLED TO, READ BACK BY AND VERIFIED WITH: SARA SCHIFFELBEIN@[id number]  02/14/21 RH    Plesimonas shigelloides NOT DETECTED NOT DETECTED Final   Salmonella species NOT DETECTED NOT DETECTED Final   Yersinia enterocolitica NOT DETECTED NOT DETECTED Final   Vibrio species NOT DETECTED NOT DETECTED Final   Vibrio cholerae NOT DETECTED NOT DETECTED Final   Enteroaggregative E coli (EAEC) NOT DETECTED NOT DETECTED Final   Enteropathogenic E coli (EPEC) NOT DETECTED NOT DETECTED Final   Enterotoxigenic E coli (ETEC) NOT DETECTED NOT DETECTED Final   Shiga like toxin producing E coli (STEC) NOT DETECTED NOT DETECTED Final   Shigella/Enteroinvasive E coli (EIEC) NOT DETECTED NOT DETECTED Final   Cryptosporidium NOT DETECTED NOT DETECTED Final   Cyclospora cayetanensis NOT DETECTED NOT DETECTED Final   Entamoeba histolytica NOT DETECTED NOT DETECTED Final   Giardia lamblia NOT DETECTED NOT DETECTED Final   Adenovirus F40/41 NOT DETECTED NOT DETECTED Final   Astrovirus NOT DETECTED NOT DETECTED Final   Norovirus GI/GII NOT DETECTED NOT DETECTED Final   Rotavirus A NOT DETECTED NOT DETECTED Final   Sapovirus (I, II, IV, and V) NOT DETECTED NOT DETECTED Final    Comment: Performed at [facility], [address] [place], [place] [address]    IMAGING RESULTS:  CXR no infiltrate CT head no acute process I have personally reviewed the  films ? Impression/Recommendation ? 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

## 2021-02-14 NOTE — Progress Notes (Addendum)
PROGRESS NOTE  Tamara [name] LHT:[id number] DOB: 02/18/1971 DOA: 02/13/2021 PCP: [facility]   LOS: 0 days   Brief narrative:   Tamara [name] is a 50 y.o. female with medical history significant for stage IV left breast cancer with positive contralateral right breast inflammatory/malignancy status postbiopsy, learning disability, currently on antineoplastic medication with Ivette Loyal, status postchemotherapy status post palliative radiation therapy, schizophrenia,  chronic hyponatremia presumed secondary to SIADH/psychiatric medication, to hospital from oncology office for concerns of lethargy.  She was found to have neutropenia with hypotension.  In the ED patient was hypotensive with tachycardia. Labs in the emergency department was remarkable for serum sodium 125, potassium 3.2, chloride 89, bicarb 21, BUN 38, serum creatinine of 2.94, nonfasting blood glucose 100, WBC 0.8, hemoglobin 6.9, platelets 80, GFR 19.  Urine pregnancy test was negative.  Magnesium level is 1.5.  In the ED, patient received Ringer lactate 1 L bolus followed by 150 mill per hour IV cefepime vancomycin and metronidazole and was admitted hospital for further evaluation and treatment.   Assessment/Plan:  Principal Problem:   Sepsis ([place]) Active Problems:   Schizophrenia, paranoid type ([place])   GERD (gastroesophageal reflux disease)   Carcinoma of overlapping sites of left breast in female, estrogen receptor negative ([place])   Breast cancer ([place])   ASCUS of cervix with negative high risk HPV   Bipolar affective disorder ([place])   Leiomyoma of uterus   Hypothyroidism   Weakness   SIADH (syndrome of inappropriate ADH production) (HCC)   Hypomagnesemia   Hypokalemia   AKI (acute kidney injury) ([place])   At high risk for infection due to neutropenia   Neutropenia (H[facility])   Generalized malaise with neutropenia concerning for sepsis Patient had tachycardia, neutropenia, acute kidney injury with possible  source being urine.  C. difficile was negative.  COVID was negative.  UA was abnormal.  GI pathogen panel showed some Campylobacter.  Currently on broad-spectrum antibiotic including vancomycin and cefepime.  Patient is immunocompromised currently on chemotherapy for stage IV breast cancer.  Follow blood cultures, urine cultures.  Preliminary blood cultures showing Pseudomonas in 1 bottle.  Patient is already on cefepime.  Procalcitonin elevated at 5.4.  Pseudomonas bacteremia on blood culture, preliminary.  Already on cefepime..  We will continue to monitor closely.  We will get ID opinion.  Campylobacter diarrhea.  C. difficile was negative.  We will get ID opinion.   Neutropenia with absolute neutrophil count of 700. On presentation.  Continue to monitor blood counts.   Hypokalemia and hypomagnesemia Improved after replacement.   Chronic hyponatremia At baseline.  Baseline range of 123-129.  Sodium level of 125 at this time.    Acute on chronic anemia Patient has received 1 unit of packed RBC transfusion.  We will continue to monitor closely.   Ovoid sclerotic focus in the right sphenoid bone adjacent to right orbital apex - 8 mm in size.  Mother and sister are aware of this lesion and will need outpatient follow-up.  Learning disability-at baseline   Hypothyroidism.   Continue Synthroid   Thrombocytopenia Spectrum of pancytopenia.  We will continue to monitor.  No evidence of bleeding.   Stage IV triple negative left breast cancer-follows up with oncology as outpatient.  Multiple nodular lesions on the chest wall.  Patient was getting chemotherapy and radiation as treatment.  [name] tells me that he was not a candidate for further radiation treatment and palliative care was following.  Discussed the poor prognosis of  metastatic cancer.  She is agreeable to palliative care discussion at this time.   History of schizophrenia, paranoid type-on Depakote 500 mg every 12 hours,  haloperidol 5 mg p.o. twice daily,  patient is due for Haldol decanoate   DVT prophylaxis: Place TED hose Start: 02/13/21 1620  Code Status: DNR/DNI.  Spoke with the patient's mom on the phone   Family Communication:  Spoke with the mother and updated her about the clinical condition of the patient. Discussed poor prognosis with her.  She is agreeable to hospice evaluation and discussion.  We will get palliative care consultation as well..  Status is: Observation  The patient will require care spanning > 2 midnights and should be moved to inpatient because: Ongoing diagnostic testing needed not appropriate for outpatient work up, Unsafe d/c plan, IV treatments appropriate due to intensity of illness or inability to take PO, and Inpatient level of care appropriate due to severity of illness  Dispo: The patient is from: Home              Anticipated d/c is to: Home              Patient currently is not medically stable to d/c.   Difficult to place patient No   Consultants: Palliative care.  Procedures: 1 unit of packed RBC transfusion.  Anti-infectives:  Cefepime, Flagyl and vancomycin  Anti-infectives (From admission, onward)    Start     Dose/Rate Route Frequency Ordered Stop   02/14/21 1600  ceFEPIme (MAXIPIME) 2 g in sodium chloride 0.9 % 100 mL IVPB        2 g 200 mL/hr over 30 Minutes Intravenous Every 24 hours 02/13/21 2202     02/13/21 2300  metroNIDAZOLE (FLAGYL) IVPB 500 mg        500 mg 100 mL/hr over 60 Minutes Intravenous Every 12 hours 02/13/21 2138     02/13/21 2300  vancomycin (VANCOREADY) IVPB 500 mg/100 mL        500 mg 100 mL/hr over 60 Minutes Intravenous  Once 02/13/21 2157 02/14/21 0313   02/13/21 2211  vancomycin variable dose per unstable renal function (pharmacist dosing)         Does not apply See admin instructions 02/13/21 2211     02/13/21 1415  ceFEPIme (MAXIPIME) 2 g in sodium chloride 0.9 % 100 mL IVPB        2 g 200 mL/hr over 30 Minutes  Intravenous  Once 02/13/21 1414 02/13/21 1632   02/13/21 1415  metroNIDAZOLE (FLAGYL) IVPB 500 mg  Status:  Discontinued        500 mg 100 mL/hr over 60 Minutes Intravenous  Once 02/13/21 1414 02/13/21 2205   02/13/21 1415  vancomycin (VANCOCIN) IVPB 1000 mg/200 mL premix        1,000 mg 200 mL/hr over 60 Minutes Intravenous  Once 02/13/21 1414 02/13/21 1734      Subjective: Today, patient was seen and examined at bedside. Denies any nausea vomiting fever or chills.  Patient denies obvious shortness of breath, cough, sputum production  Objective: Vitals:   02/14/21 0630 02/14/21 0838  BP: (!) 97/59 (!) 108/57  Pulse: 100 100  Resp: 17 20  Temp:    SpO2: 100% 100%    Intake/Output Summary (Last 24 hours) at 02/14/2021 1115 Last data filed at 02/14/2021 0148 Gross per 24 hour  Intake 852 ml  Output --  Net 852 ml   Filed Weights   02/13/21 1159  Weight:  65.5 kg   Body mass index is 26.39 kg/m.   Physical Exam:  GENERAL: Patient is alert awake and communicative at this time. Not in obvious distress. HENT: No scleral pallor or icterus. Pupils equally reactive to light. Oral mucosa is moist NECK: is supple, no gross swelling noted. CHEST: Clear to auscultation. No crackles or wheezes.  Diminished breath sounds bilaterally.  Chest wall erythema from previous surgery radiation with multiple nodules.   Right chest wall Port-A-Cath in place. CVS: S1 and S2 heard, no murmur. Regular rate and rhythm.  ABDOMEN: Soft, non-tender, bowel sounds are present. EXTREMITIES: No edema. CNS: Cranial nerves are intact. No focal motor deficits. SKIN: warm and dry without rashes.  Data Review: I have personally reviewed the following laboratory data and studies,  CBC: Recent Labs  Lab 02/08/21 0823 02/13/21 1057 02/14/21 0448  WBC 1.6* 0.8* 0.8*  NEUTROABS 1.1* 0.7* 0.7*  HGB 8.7* 6.9* 7.2*  HCT 24.7* 19.7* 20.3*  MCV 87.9 87.2 85.3  PLT 93* 80* 71*   Basic Metabolic  Panel: Recent Labs  Lab 02/08/21 0823 02/13/21 1057 02/13/21 1115 02/14/21 0448  NA 127* 125*  --  125*  K 3.3* 3.2*  --  3.7  CL 89* 89*  --  95*  CO2 25 21*  --  21*  GLUCOSE 98 100*  --  110*  BUN <5* 38*  --  21*  CREATININE 0.50 2.94*  --  1.05*  CALCIUM 9.0 8.6*  --  8.3*  MG  --   --  1.5* 3.3*  PHOS  --   --   --  3.2   Liver Function Tests: Recent Labs  Lab 02/08/21 0823 02/13/21 1057  AST 13* 15  ALT 11 16  ALKPHOS 74 59  BILITOT 0.8 1.0  PROT 7.3 7.5  ALBUMIN 3.3* 3.1*   No results for input(s): LIPASE, AMYLASE in the last 168 hours. No results for input(s): AMMONIA in the last 168 hours. Cardiac Enzymes: No results for input(s): CKTOTAL, CKMB, CKMBINDEX, TROPONINI in the last 168 hours. BNP (last 3 results) No results for input(s): BNP in the last 8760 hours.  ProBNP (last 3 results) No results for input(s): PROBNP in the last 8760 hours.  CBG: No results for input(s): GLUCAP in the last 168 hours. Recent Results (from the past 240 hour(s))  Blood Culture (routine x 2)     Status: None (Preliminary result)   Collection Time: 02/13/21  3:50 PM   Specimen: BLOOD  Result Value Ref Range Status   Specimen Description BLOOD PORTA CATH  Final   Special Requests   Final    BOTTLES DRAWN AEROBIC AND ANAEROBIC Blood Culture adequate volume   Culture  Setup Time   Final    AEROBIC BOTTLE ONLY GRAM NEGATIVE RODS Organism ID to follow CRITICAL RESULT CALLED TO, READ BACK BY AND VERIFIED WITH: SUSAN WATSON 02/14/21 1054 KLW Performed at [facility], [address] [place], [place] [address]    Culture GRAM NEGATIVE RODS  Final   Report Status PENDING  Incomplete  Blood Culture ID Panel (Reflexed)     Status: Abnormal   Collection Time: 02/13/21  3:50 PM  Result Value Ref Range Status   Enterococcus faecalis NOT DETECTED NOT DETECTED Final   Enterococcus Faecium NOT DETECTED NOT DETECTED Final   Listeria monocytogenes NOT DETECTED NOT DETECTED  Final   Staphylococcus species NOT DETECTED NOT DETECTED Final   Staphylococcus aureus (BCID) NOT DETECTED NOT DETECTED Final  Staphylococcus epidermidis NOT DETECTED NOT DETECTED Final   Staphylococcus lugdunensis NOT DETECTED NOT DETECTED Final   Streptococcus species NOT DETECTED NOT DETECTED Final   Streptococcus agalactiae NOT DETECTED NOT DETECTED Final   Streptococcus pneumoniae NOT DETECTED NOT DETECTED Final   Streptococcus pyogenes NOT DETECTED NOT DETECTED Final   A.calcoaceticus-baumannii NOT DETECTED NOT DETECTED Final   Bacteroides fragilis NOT DETECTED NOT DETECTED Final   Enterobacterales NOT DETECTED NOT DETECTED Final   Enterobacter cloacae complex NOT DETECTED NOT DETECTED Final   Escherichia coli NOT DETECTED NOT DETECTED Final   Klebsiella aerogenes NOT DETECTED NOT DETECTED Final   Klebsiella oxytoca NOT DETECTED NOT DETECTED Final   Klebsiella pneumoniae NOT DETECTED NOT DETECTED Final   Proteus species NOT DETECTED NOT DETECTED Final   Salmonella species NOT DETECTED NOT DETECTED Final   Serratia marcescens NOT DETECTED NOT DETECTED Final   Haemophilus influenzae NOT DETECTED NOT DETECTED Final   Neisseria meningitidis NOT DETECTED NOT DETECTED Final   Pseudomonas aeruginosa DETECTED (A) NOT DETECTED Final    Comment: CRITICAL RESULT CALLED TO, READ BACK BY AND VERIFIED WITH: SUSAN WATSON 02/14/21 1054 KLW    Stenotrophomonas maltophilia NOT DETECTED NOT DETECTED Final   Candida albicans NOT DETECTED NOT DETECTED Final   Candida auris NOT DETECTED NOT DETECTED Final   Candida glabrata NOT DETECTED NOT DETECTED Final   Candida krusei NOT DETECTED NOT DETECTED Final   Candida parapsilosis NOT DETECTED NOT DETECTED Final   Candida tropicalis NOT DETECTED NOT DETECTED Final   Cryptococcus neoformans/gattii NOT DETECTED NOT DETECTED Final   CTX-M ESBL NOT DETECTED NOT DETECTED Final   Carbapenem resistance IMP NOT DETECTED NOT DETECTED Final   Carbapenem  resistance KPC NOT DETECTED NOT DETECTED Final   Carbapenem resistance NDM NOT DETECTED NOT DETECTED Final   Carbapenem resistance VIM NOT DETECTED NOT DETECTED Final    Comment: Performed at [facility], [address] [place], [place] [address]  Resp Panel by RT-PCR (Flu A&B, Covid) Nasopharyngeal Swab     Status: None   Collection Time: 02/13/21  4:45 PM   Specimen: Nasopharyngeal Swab; Nasopharyngeal(NP) swabs in vial transport medium  Result Value Ref Range Status   SARS Coronavirus 2 by RT PCR NEGATIVE NEGATIVE Final    Comment: (NOTE) SARS-CoV-2 target nucleic acids are NOT DETECTED.  The SARS-CoV-2 RNA is generally detectable in upper respiratory specimens during the acute phase of infection. The lowest concentration of SARS-CoV-2 viral copies this assay can detect is 138 copies/mL. A negative result does not preclude SARS-Cov-2 infection and should not be used as the sole basis for treatment or other patient management decisions. A negative result may occur with  improper specimen collection/handling, submission of specimen other than nasopharyngeal swab, presence of viral mutation(s) within the areas targeted by this assay, and inadequate number of viral copies(<138 copies/mL). A negative result must be combined with clinical observations, patient history, and epidemiological information. The expected result is Negative.  Fact Sheet for Patients:  EntrepreneurPulse.com.au  Fact Sheet for Healthcare Providers:  IncredibleEmployment.be  This test is no t yet approved or cleared by the [place] FDA and  has been authorized for detection and/or diagnosis of SARS-CoV-2 by FDA under an Emergency Use Authorization (EUA). This EUA will remain  in effect (meaning this test can be used) for the duration of the COVID-19 declaration under Section 564(b)(1) of the Act, 21 U.S.C.section 360bbb-3(b)(1), unless the authorization is  terminated  or revoked sooner.  Influenza A by PCR NEGATIVE NEGATIVE Final   Influenza B by PCR NEGATIVE NEGATIVE Final    Comment: (NOTE) The Xpert Xpress SARS-CoV-2/FLU/RSV plus assay is intended as an aid in the diagnosis of influenza from Nasopharyngeal swab specimens and should not be used as a sole basis for treatment. Nasal washings and aspirates are unacceptable for Xpert Xpress SARS-CoV-2/FLU/RSV testing.  Fact Sheet for Patients: EntrepreneurPulse.com.au  Fact Sheet for Healthcare Providers: IncredibleEmployment.be  This test is not yet approved or cleared by the [place] FDA and has been authorized for detection and/or diagnosis of SARS-CoV-2 by FDA under an Emergency Use Authorization (EUA). This EUA will remain in effect (meaning this test can be used) for the duration of the COVID-19 declaration under Section 564(b)(1) of the Act, 21 U.S.C. section 360bbb-3(b)(1), unless the authorization is terminated or revoked.  Performed at [facility], [place], [place] [address]   C Difficile Quick Screen w PCR reflex     Status: None   Collection Time: 02/14/21 12:08 AM   Specimen: STOOL  Result Value Ref Range Status   C Diff antigen NEGATIVE NEGATIVE Final   C Diff toxin NEGATIVE NEGATIVE Final   C Diff interpretation No C. difficile detected.  Final    Comment: Performed at [facility], [address] [place], [place] [address]  Gastrointestinal Panel by PCR , Stool     Status: Abnormal   Collection Time: 02/14/21 12:08 AM   Specimen: STOOL  Result Value Ref Range Status   Campylobacter species DETECTED (A) NOT DETECTED Final    Comment: RESULT CALLED TO, READ BACK BY AND VERIFIED WITH: SARA SCHIFFELBEIN@[id number]  02/14/21 RH    Plesimonas shigelloides NOT DETECTED NOT DETECTED Final   Salmonella species NOT DETECTED NOT DETECTED Final   Yersinia enterocolitica NOT DETECTED NOT  DETECTED Final   Vibrio species NOT DETECTED NOT DETECTED Final   Vibrio cholerae NOT DETECTED NOT DETECTED Final   Enteroaggregative E coli (EAEC) NOT DETECTED NOT DETECTED Final   Enteropathogenic E coli (EPEC) NOT DETECTED NOT DETECTED Final   Enterotoxigenic E coli (ETEC) NOT DETECTED NOT DETECTED Final   Shiga like toxin producing E coli (STEC) NOT DETECTED NOT DETECTED Final   Shigella/Enteroinvasive E coli (EIEC) NOT DETECTED NOT DETECTED Final   Cryptosporidium NOT DETECTED NOT DETECTED Final   Cyclospora cayetanensis NOT DETECTED NOT DETECTED Final   Entamoeba histolytica NOT DETECTED NOT DETECTED Final   Giardia lamblia NOT DETECTED NOT DETECTED Final   Adenovirus F40/41 NOT DETECTED NOT DETECTED Final   Astrovirus NOT DETECTED NOT DETECTED Final   Norovirus GI/GII NOT DETECTED NOT DETECTED Final   Rotavirus A NOT DETECTED NOT DETECTED Final   Sapovirus (I, II, IV, and V) NOT DETECTED NOT DETECTED Final    Comment: Performed at [facility], [address] [place], [place] [address]     Studies: CT HEAD WO CONTRAST (5MM)  Result Date: 02/13/2021 CLINICAL DATA:  Neuro deficit, acute, stroke suspected. Additional history provided: Patient from cancer center with increased weakness, hypotension, nausea/vomiting, altered mental status. EXAM: CT HEAD WITHOUT CONTRAST TECHNIQUE: Contiguous axial images were obtained from the base of the skull through the vertex without intravenous contrast. COMPARISON:  Brain MRI 05/12/2020. FINDINGS: Brain: Mild generalized cerebral and cerebellar atrophy. There is no acute intracranial hemorrhage. No demarcated cortical infarct. No extra-axial fluid collection. No evidence of an intracranial mass. No midline shift. Vascular: No hyperdense vessel.  Atherosclerotic calcifications. Skull: Nonspecific 8 mm ovoid  sclerotic focus within the right sphenoid bone, adjacent to the right orbital apex (series 3, image 13). Otherwise unremarkable.  Sinuses/Orbits: Visualized orbits show no acute finding. No significant paranasal sinus disease at the imaged levels. IMPRESSION: No evidence of acute intracranial abnormality. Mild generalized cerebral and cerebellar atrophy. 8 mm ovoid sclerotic focus within the right sphenoid bone, adjacent to the right orbital apex. This finding is nonspecific, but an osseous metastasis cannot be excluded given the patient's history of malignancy. 3-6 month CT follow-up recommended to ensure stability. Electronically Signed   By: Kellie Simmering D.O.   On: 02/13/2021 15:38   DG Chest Port 1 View  Result Date: 02/13/2021 CLINICAL DATA:  Possible sepsis EXAM: PORTABLE CHEST 1 VIEW COMPARISON:  05/02/2020 FINDINGS: Right chest wall port catheter tip in similar position. Lung volumes are low. No new consolidation. No pleural effusion. Stable cardiomediastinal contours IMPRESSION: No acute process in the chest. Electronically Signed   By: Macy Mis M.D.   On: 02/13/2021 14:59      Flora Lipps, MD  [facility] 02/14/2021  If 7PM-7AM, please contact night-coverage

## 2021-02-14 NOTE — ED Notes (Signed)
Pt resting comfortably at this time. Pt hypotensive per night shift RN and has been like this. Call bell in reach.

## 2021-02-14 NOTE — Progress Notes (Signed)
PHARMACY - PHYSICIAN COMMUNICATION CRITICAL VALUE ALERT - BLOOD CULTURE IDENTIFICATION (BCID)  Tamara [name] is an 50 y.o. female who presented to [facility] on 02/13/2021 with a chief complaint of weakness, lethargy, hypotension  Assessment:  Urine/Neutropenia  Name of physician (or Provider) Contacted: Dr Louanne Belton  Current antibiotics: cefepime 2gm q 24hrs & Vancomycin  Changes to prescribed antibiotics recommended:  Patient on recommended antibiotic minus tobramycin. Dr Louanne Belton want to continue current therapy and consult ID team.  Results for orders placed or performed during the hospital encounter of 02/13/21  Blood Culture ID Panel (Reflexed) (Collected: 02/13/2021  3:50 PM)  Result Value Ref Range   Enterococcus faecalis NOT DETECTED NOT DETECTED   Enterococcus Faecium NOT DETECTED NOT DETECTED   Listeria monocytogenes NOT DETECTED NOT DETECTED   Staphylococcus species NOT DETECTED NOT DETECTED   Staphylococcus aureus (BCID) NOT DETECTED NOT DETECTED   Staphylococcus epidermidis NOT DETECTED NOT DETECTED   Staphylococcus lugdunensis NOT DETECTED NOT DETECTED   Streptococcus species NOT DETECTED NOT DETECTED   Streptococcus agalactiae NOT DETECTED NOT DETECTED   Streptococcus pneumoniae NOT DETECTED NOT DETECTED   Streptococcus pyogenes NOT DETECTED NOT DETECTED   A.calcoaceticus-baumannii NOT DETECTED NOT DETECTED   Bacteroides fragilis NOT DETECTED NOT DETECTED   Enterobacterales NOT DETECTED NOT DETECTED   Enterobacter cloacae complex NOT DETECTED NOT DETECTED   Escherichia coli NOT DETECTED NOT DETECTED   Klebsiella aerogenes NOT DETECTED NOT DETECTED   Klebsiella oxytoca NOT DETECTED NOT DETECTED   Klebsiella pneumoniae NOT DETECTED NOT DETECTED   Proteus species NOT DETECTED NOT DETECTED   Salmonella species NOT DETECTED NOT DETECTED   Serratia marcescens NOT DETECTED NOT DETECTED   Haemophilus influenzae NOT DETECTED NOT DETECTED   Neisseria meningitidis NOT  DETECTED NOT DETECTED   Pseudomonas aeruginosa DETECTED (A) NOT DETECTED   Stenotrophomonas maltophilia NOT DETECTED NOT DETECTED   Candida albicans NOT DETECTED NOT DETECTED   Candida auris NOT DETECTED NOT DETECTED   Candida glabrata NOT DETECTED NOT DETECTED   Candida krusei NOT DETECTED NOT DETECTED   Candida parapsilosis NOT DETECTED NOT DETECTED   Candida tropicalis NOT DETECTED NOT DETECTED   Cryptococcus neoformans/gattii NOT DETECTED NOT DETECTED   CTX-M ESBL NOT DETECTED NOT DETECTED   Carbapenem resistance IMP NOT DETECTED NOT DETECTED   Carbapenem resistance KPC NOT DETECTED NOT DETECTED   Carbapenem resistance NDM NOT DETECTED NOT DETECTED   Carbapenem resistance VIM NOT DETECTED NOT DETECTED    Kym Scannell Rodriguez-Guzman PharmD, BCPS 02/14/2021 1:25 PM

## 2021-02-14 NOTE — Assessment & Plan Note (Addendum)
#  50 year old female patient with a history of schizophrenia and metastatic/stage IV breast cancer currently on palliative chemotherapy is currently admitted to hospital for severe neutropenia/sepsis/mental status changes.  # Stage IV -left breast cancer T4 N2 M1 [subcutaneous nodules].  Triple negative- positive for contralateral right breast inflammatory/malignancy status postbiopsy.  The patient currently on Trodelvy; patient currently s/p cycle #1- day-8 [on 9/16]; growth factor on 9/19.  Poor tolerance to therapy-recommend hospice.  Discussed with patient's Sister [name]; the agreement.  #Neutropenic sepsis/bacteremia with Pseudomonas/diarrhea-ciprofloxacin.;  Campylobacter-azithromycin as per ID.  # Electrolytes abnormalities: chronic hyponatremia-sodium 125 likely secondary to dehydration/SIADH/psychiatric medication/diarrhea from chemotherapy  # Mental status changes-likely metabolic encephalopathy/-improved.  Discussed with the sister and they are interested in follow-up in the cancer center/or else the patient will be followed by hospice at home.

## 2021-02-14 NOTE — Consult Note (Signed)
[place] CONSULT NOTE  Patient Care Team: Physicians, [place] as PCP - General Vladimir Crofts, MD as Consulting Physician (Neurology) Cammie Sickle, MD as Consulting Physician (Internal Medicine) Randel Pigg, MD as Consulting Physician (Psychiatry) Herbert Pun, MD as Consulting Physician (General Surgery)  CHIEF COMPLAINTS/PURPOSE OF CONSULTATION:  Metastatic breast cancer  HISTORY OF PRESENTING ILLNESS: Patient is a poor historian/given mental status changes/history of schizophrenia.  Mother not at bedside.  Discussed with patient's Sister Tamara [name] over the phone.  Tamara [name] 50 y.o.  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  Patient received Ivette Loyal cycle number 1 day 8 approximately 6 days ago.  Day #8-dose reduced by 20%. Patient received growth factor on 9/19.   Patient was evaluated in the cancer center the day of admission-noted to have significant hypotension; significant hyponatremia/severe neutropenia.  Patient is currently admitted to hospital for neutropenic sepsis.  Chest x-ray negative/blood culture positive for Pseudomonas.  Patient is on cefepime plus vancomycin.   s/p evaluation with ID.  As per nursing patient having significant diarrhea.  No abdominal pain.  Review of Systems  Unable to perform ROS: Psychiatric disorder    MEDICAL HISTORY:  Past Medical History:  Diagnosis Date   Breast cancer ([place]) 03/2020   left   Family history of breast cancer    Family history of stomach cancer    Family history of stomach cancer    GERD (gastroesophageal reflux disease)    Hypothyroidism    IUD (intrauterine device) in place IUD was placed in 2016   Leiomyoma of uterus    Schizophrenia ([place])    Thyroid disease     SURGICAL HISTORY: Past Surgical  History:  Procedure Laterality Date   ADJACENT TISSUE TRANSFER/TISSUE REARRANGEMENT N/A 10/29/2020   Procedure: POSSIBLE ADJACENT TISSUE TRANSFER, TISSUE REARRANGEMENT;  Surgeon: Wallace Going, DO;  Location: [facility] ORS;  Service: Plastics;  Laterality: N/A;   APPLICATION OF A-CELL OF CHEST/ABDOMEN N/A 10/29/2020   Procedure: POSSIBLE APPLICATION OF A-CELL OF CHEST/ABDOMEN;  Surgeon: Wallace Going, DO;  Location: [facility] ORS;  Service: Plastics;  Laterality: N/A;   APPLICATION OF WOUND VAC N/A 10/29/2020   Procedure: POSSIBLE APPLICATION OF WOUND VAC;  Surgeon: Wallace Going, DO;  Location: [facility] ORS;  Service: Plastics;  Laterality: N/A;   BREAST BIOPSY Left 03/2020   [facility]- Triple neg- Stage 3   PORTACATH PLACEMENT Right 05/02/2020   Procedure: INSERTION PORT-A-CATH;  Surgeon: Herbert Pun, MD;  Location: [facility] ORS;  Service: General;  Laterality: Right;   TOTAL MASTECTOMY Left 10/29/2020   Procedure: TOTAL MASTECTOMY --- RADICAL;  Surgeon: Herbert Pun, MD;  Location: [facility] ORS;  Service: General;  Laterality: Left;  needs 3 hrs    SOCIAL HISTORY: Social History   Socioeconomic History   Marital status: Single    Spouse name: Not on file   Number of children: Not on file   Years of education: Not on file   Highest education level: Not on file  Occupational History   Not on file  Tobacco Use   Smoking status: Never   Smokeless tobacco: Never  Vaping Use   Vaping Use: Never used  Substance and Sexual Activity   Alcohol use: No   Drug use: No   Sexual activity: Yes    Partners: Male    Birth control/protection: Surgical, Injection  Other Topics  Concern   Not on file  Social History Narrative   Not on file   Social Determinants of Health   Financial Resource Strain: Not on file  Food Insecurity: Not on file  Transportation Needs: Not on file  Physical Activity: Not on file  Stress: Not on file  Social Connections: Not on file  Intimate Partner  Violence: Not on file    FAMILY HISTORY: Family History  Problem Relation Age of Onset   Breast cancer Mother    Stomach cancer Maternal Grandfather    Other Maternal Aunt        died from tumor, brain?    ALLERGIES:  is allergic to paclitaxel.  MEDICATIONS:  Current Facility-Administered Medications  Medication Dose Route Frequency Provider Last Rate Last Admin   0.9 %  sodium chloride infusion   Intravenous Continuous Sharion Settler, NP 150 mL/hr at 02/14/21 2204 New Bag at 02/14/21 2204   acetaminophen (TYLENOL) tablet 650 mg  650 mg Oral Q6H PRN Cox, Amy N, DO       Or   acetaminophen (TYLENOL) suppository 650 mg  650 mg Rectal Q6H PRN Cox, Amy N, DO       benztropine (COGENTIN) tablet 0.5 mg  0.5 mg Oral BID Cox, Amy N, DO   0.5 mg at 02/14/21 2203   ceFEPIme (MAXIPIME) 2 g in sodium chloride 0.9 % 100 mL IVPB  2 g Intravenous Q12H Pokhrel, Laxman, MD 200 mL/hr at 02/14/21 2206 2 g at 02/14/21 2206   Chlorhexidine Gluconate Cloth 2 % PADS 6 each  6 each Topical Daily Pokhrel, Laxman, MD   6 each at 02/14/21 2204   clonazePAM (KLONOPIN) tablet 0.5 mg  0.5 mg Oral QHS Cox, Amy N, DO   0.5 mg at 02/14/21 2203   diphenoxylate-atropine (LOMOTIL) 2.5-0.025 MG per tablet 1 tablet  1 tablet Oral QID PRN Cammie Sickle, MD   1 tablet at 02/14/21 1610   divalproex (DEPAKOTE) DR tablet 500 mg  500 mg Oral Q12H Cox, Amy N, DO   500 mg at 02/14/21 2203   fluticasone (FLONASE) 50 MCG/ACT nasal spray 1 spray  1 spray Each Nare Daily PRN Cox, Amy N, DO       haloperidol (HALDOL) tablet 5 mg  5 mg Oral BID Cox, Amy N, DO   5 mg at 02/14/21 2203   haloperidol decanoate (HALDOL DECANOATE) 100 MG/ML injection 100 mg  100 mg Intramuscular Q28 days Cox, Amy N, DO   100 mg at 02/14/21 1610   HYDROcodone-acetaminophen (NORCO/VICODIN) 5-325 MG per tablet 1 tablet  1 tablet Oral Q12H PRN Cox, Amy N, DO       hydrOXYzine (ATARAX/VISTARIL) tablet 25 mg  25 mg Oral Q8H PRN Cox, Amy N, DO        levothyroxine (SYNTHROID) tablet 88 mcg  88 mcg Oral Q0600 Cox, Amy N, DO   88 mcg at 02/14/21 0800   liver oil-zinc oxide (DESITIN) 40 % ointment   Topical PRN Pokhrel, Corrie Mckusick, MD   Given at 02/14/21 2203   melatonin tablet 2.5 mg  2.5 mg Oral QHS PRN Cox, Amy N, DO       ondansetron (ZOFRAN) tablet 4 mg  4 mg Oral Q6H PRN Cox, Amy N, DO       Or   ondansetron (ZOFRAN) injection 4 mg  4 mg Intravenous Q6H PRN Cox, Amy N, DO       sodium chloride 0.9 % bolus 500 mL  500  mL Intravenous Once Sharion Settler, NP   Held at 02/14/21 0228   Facility-Administered Medications Ordered in Other Encounters  Medication Dose Route Frequency Provider Last Rate Last Admin   sodium chloride flush (NS) 0.9 % injection 10 mL  10 mL Intravenous PRN Cammie Sickle, MD   10 mL at 06/14/20 0840      .  PHYSICAL EXAMINATION:  Vitals:   02/14/21 1600 02/14/21 1950  BP: 107/68 (!) 128/59  Pulse: (!) 104 100  Resp: 17 19  Temp: 97.7 F (36.5 C) 98.8 F (37.1 C)  SpO2: 100% 100%   Filed Weights   02/13/21 1159  Weight: 144 lb 4.7 oz (65.5 kg)    Physical Exam Vitals and nursing note reviewed.  HENT:     Head: Normocephalic and atraumatic.     Mouth/Throat:     Pharynx: Oropharynx is clear.  Eyes:     Extraocular Movements: Extraocular movements intact.     Pupils: Pupils are equal, round, and reactive to light.  Cardiovascular:     Rate and Rhythm: Normal rate and regular rhythm.  Pulmonary:     Comments: Decreased breath sounds bilaterally.  Abdominal:     Palpations: Abdomen is soft.  Musculoskeletal:        General: Normal range of motion.     Cervical back: Normal range of motion.  Skin:    General: Skin is warm.     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  Neurological:     General: No focal deficit present.     Mental Status: She is alert.     Comments: Oriented x2.   Psychiatric:     Comments: Poor insight.     LABORATORY DATA:  I have reviewed the data as listed Lab Results  Component Value Date   WBC 0.8 (LL) 02/14/2021   HGB 7.2 (L) 02/14/2021   HCT 20.3 (L) 02/14/2021   MCV 85.3 02/14/2021   PLT 71 (L) 02/14/2021   Recent Labs    02/01/21 0758 02/08/21 0823 02/13/21 1057 02/14/21 0448  NA 129* 127* 125* 125*  K 3.5 3.3* 3.2* 3.7  CL 92* 89* 89* 95*  CO2 26 25 21* 21*  GLUCOSE 92 98 100* 110*  BUN <5* <5* 38* 21*  CREATININE 0.54 0.50 2.94* 1.05*  CALCIUM 9.3 9.0 8.6* 8.3*  GFRNONAA >60 >60 19* >60  PROT 6.8 7.3 7.5  --   ALBUMIN 3.1* 3.3* 3.1*  --   AST 14* 13* 15  --   ALT 7 11 16   --   ALKPHOS 54 74 59  --   BILITOT 0.6 0.8 1.0  --     RADIOGRAPHIC STUDIES: I have personally reviewed the radiological images as listed and agreed with the findings in the report. CT HEAD WO CONTRAST (5MM)  Result Date: 02/13/2021 CLINICAL DATA:  Neuro deficit, acute, stroke suspected. Additional history provided: Patient from cancer center with increased weakness, hypotension, nausea/vomiting, altered mental status. EXAM: CT HEAD WITHOUT CONTRAST TECHNIQUE: Contiguous axial images were obtained from the base of the skull through the vertex without intravenous contrast. COMPARISON:  Brain MRI 05/12/2020. FINDINGS: Brain: Mild generalized cerebral and cerebellar atrophy. There is no acute intracranial hemorrhage. No demarcated cortical infarct. No extra-axial fluid collection. No evidence of an intracranial mass. No midline shift. Vascular: No hyperdense vessel.  Atherosclerotic calcifications. Skull: Nonspecific 8 mm ovoid sclerotic focus within the right sphenoid bone, adjacent  to the right orbital apex (series 3, image 13). Otherwise unremarkable. Sinuses/Orbits: Visualized orbits show no acute finding. No significant paranasal sinus disease at the imaged levels. IMPRESSION: No evidence of acute intracranial abnormality. Mild generalized cerebral  and cerebellar atrophy. 8 mm ovoid sclerotic focus within the right sphenoid bone, adjacent to the right orbital apex. This finding is nonspecific, but an osseous metastasis cannot be excluded given the patient's history of malignancy. 3-6 month CT follow-up recommended to ensure stability. Electronically Signed   By: Kellie Simmering D.O.   On: 02/13/2021 15:38   DG Chest Port 1 View  Result Date: 02/13/2021 CLINICAL DATA:  Possible sepsis EXAM: PORTABLE CHEST 1 VIEW COMPARISON:  05/02/2020 FINDINGS: Right chest wall port catheter tip in similar position. Lung volumes are low. No new consolidation. No pleural effusion. Stable cardiomediastinal contours IMPRESSION: No acute process in the chest. Electronically Signed   By: Macy Mis M.D.   On: 02/13/2021 14:59    Carcinoma of overlapping sites of left breast in female, estrogen receptor negative [facility]) #50 year old female patient with a history of schizophrenia and metastatic/stage IV breast cancer currently on palliative chemotherapy is currently admitted to hospital for severe neutropenia/sepsis/mental status changes.  # Stage IV -left breast cancer T4 N2 M1 [subcutaneous nodules].  Triple negative- positive for contralateral right breast inflammatory/malignancy status postbiopsy.  The patient currently on Trodelvy; patient currently s/p cycle #1- day-8 [on 9/16]; growth factor on 9/19.   #Neutropenic sepsis/bacteremia with Pseudomonas-currently on vancomycin plus cefepime as per ID.  # Electrolytes abnormalities: chronic hyponatremia-sodium 125 likely secondary to dehydration/SIADH/psychiatric medication/diarrhea from chemotherapy  # Mental status changes-likely metabolic encephalopathy/CT scan brain negative for any metastatic disease.  # schizophrenia: Stable.  Plan:   #Continue supportive therapy/broad-spectrum antibiotics; patient received growth factor on 9/19.  [name] white count should improve in the next few days.  #Recommend  Lomotil every 4-6 hours as needed.  #I had a long discussion with the patient's sister's Audrey-regarding the patient's overall extreme poor prognosis/and the extreme intolerance to ongoing chemotherapy.  Given the patient's probated toxicity/overall poor prognosis-I think it is reasonable to hold further chemotherapy.  Discussed that once patient is clinically stable-she will be eligible for hospice care at home.  In general the life expectancy for hospice patients would be 6 months or less.  However given patient's aggressive malignancy it is likely patient's life expectancy is less than 6 months.  [name] understands the patient's clinical situation.  She is in agreement.  Would recommend evaluation with palliative care early next week/in anticipation of discharge on hospice when clinically stable.   Thank you  for allowing me to participate in the care of your pleasant patient. Please do not hesitate to contact me with questions or concerns in the interim.  All questions were answered. The patient knows to call the clinic with any problems, questions or concerns.    Cammie Sickle, MD 02/14/2021 10:29 PM

## 2021-02-14 NOTE — ED Notes (Signed)
IV team at bedside for venous access placement

## 2021-02-14 NOTE — ED Notes (Signed)
Pt ambulatory to restroom with steady gait and standby assist from staff.

## 2021-02-14 NOTE — Progress Notes (Signed)
Pt received fulphila on 9/19.   GB

## 2021-02-14 NOTE — ED Notes (Signed)
Oncologist present at bedside who states he will order medication to treat diarrhea.

## 2021-02-14 NOTE — Progress Notes (Signed)
Pharmacy Antibiotic Note  Tamara [name] is a 50 y.o. female admitted on 02/13/2021.  Pharmacy has been consulted for vancomycin and cefepime dosing for sepsis.  Height: 5\' 2"  (157.5 cm) Weight: 65.5 kg (144 lb 4.7 oz) IBW/kg (Calculated) : 50.1  Temp (24hrs), Avg:98.8 F (37.1 C), Min:98.4 F (36.9 C), Max:99.5 F (37.5 C)  Recent Labs  Lab 02/08/21 0823 02/13/21 1057 02/13/21 1206 02/14/21 0448  WBC 1.6* 0.8*  --  0.8*  CREATININE 0.50 2.94*  --  1.05*  LATICACIDVEN  --   --  1.0  --      Estimated Creatinine Clearance: 57.6 mL/min (A) (by C-G formula based on SCr of 1.05 mg/dL (H)).    Allergies  Allergen Reactions   Paclitaxel Anaphylaxis    Hypersensitivity reaction [flushing; shortness of breath; drop in blood pressure; tachycardia]    Antimicrobials this admission: 9/21 cefepime >>  9/21 vancomycin >>  9/21 metronidazole >>  Microbiology results: 9/21 BCx: sent 9/21 UCx: sent   Plan: Vancomycin: Vancomycin 1500mg  loading , then Vancomycin 1000 mg IV Q 24 hrs. Goal AUC 400-550. Expected AUC: 451 SCr used: 1.05 Obtain vanc levels at steady state if continued  Cefepime: Cefepime dose adjusted for Pseudomonas infection and improved renal function to 2gm every 12 hours.  Monitor renal function and adjust dose as clinically indicated    Thank you for allowing pharmacy to be a part of this patient's care.  Neka Bise Rodriguez-Guzman PharmD, BCPS 02/14/2021 11:57 AM

## 2021-02-14 NOTE — ED Notes (Signed)
Messaged attending provider DR Pokhrel to see if PRN medications are available to treat pt's diarrhea.

## 2021-02-15 ENCOUNTER — Inpatient Hospital Stay: Payer: Medicaid Other

## 2021-02-15 DIAGNOSIS — N179 Acute kidney failure, unspecified: Secondary | ICD-10-CM | POA: Diagnosis not present

## 2021-02-15 DIAGNOSIS — R87610 Atypical squamous cells of undetermined significance on cytologic smear of cervix (ASC-US): Secondary | ICD-10-CM

## 2021-02-15 DIAGNOSIS — A419 Sepsis, unspecified organism: Secondary | ICD-10-CM | POA: Diagnosis not present

## 2021-02-15 DIAGNOSIS — Z171 Estrogen receptor negative status [ER-]: Secondary | ICD-10-CM | POA: Diagnosis not present

## 2021-02-15 DIAGNOSIS — Z9189 Other specified personal risk factors, not elsewhere classified: Secondary | ICD-10-CM | POA: Diagnosis not present

## 2021-02-15 DIAGNOSIS — Z515 Encounter for palliative care: Secondary | ICD-10-CM

## 2021-02-15 DIAGNOSIS — Z7189 Other specified counseling: Secondary | ICD-10-CM

## 2021-02-15 DIAGNOSIS — D703 Neutropenia due to infection: Secondary | ICD-10-CM

## 2021-02-15 DIAGNOSIS — C50812 Malignant neoplasm of overlapping sites of left female breast: Secondary | ICD-10-CM | POA: Diagnosis not present

## 2021-02-15 LAB — CBC
HCT: 20.2 % — ABNORMAL LOW (ref 36.0–46.0)
Hemoglobin: 7.2 g/dL — ABNORMAL LOW (ref 12.0–15.0)
MCH: 30.9 pg (ref 26.0–34.0)
MCHC: 35.6 g/dL (ref 30.0–36.0)
MCV: 86.7 fL (ref 80.0–100.0)
Platelets: 53 10*3/uL — ABNORMAL LOW (ref 150–400)
RBC: 2.33 MIL/uL — ABNORMAL LOW (ref 3.87–5.11)
RDW: 15.1 % (ref 11.5–15.5)
WBC: 0.9 10*3/uL — CL (ref 4.0–10.5)
nRBC: 0 % (ref 0.0–0.2)

## 2021-02-15 LAB — BASIC METABOLIC PANEL
Anion gap: 9 (ref 5–15)
BUN: 8 mg/dL (ref 6–20)
CO2: 20 mmol/L — ABNORMAL LOW (ref 22–32)
Calcium: 8.2 mg/dL — ABNORMAL LOW (ref 8.9–10.3)
Chloride: 102 mmol/L (ref 98–111)
Creatinine, Ser: 0.55 mg/dL (ref 0.44–1.00)
GFR, Estimated: >60 mL/min (ref >60)
Glucose, Bld: 102 mg/dL — ABNORMAL HIGH (ref 70–99)
Potassium: 3 mmol/L — ABNORMAL LOW (ref 3.5–5.1)
Sodium: 131 mmol/L — ABNORMAL LOW (ref 135–145)

## 2021-02-15 LAB — PHOSPHORUS: Phosphorus: 2.6 mg/dL (ref 2.5–4.6)

## 2021-02-15 LAB — MAGNESIUM: Magnesium: 2.3 mg/dL (ref 1.7–2.4)

## 2021-02-15 MED ORDER — CIPROFLOXACIN HCL 500 MG PO TABS: 500.0000 mg | ORAL_TABLET | Freq: Two times a day (BID) | ORAL | 0 refills | Status: AC

## 2021-02-15 MED ORDER — AZITHROMYCIN 500 MG PO TABS: 500.0000 mg | ORAL_TABLET | Freq: Every day | ORAL | 0 refills | Status: AC

## 2021-02-15 MED ORDER — SODIUM CHLORIDE 0.9 % IV SOLN
2.0000 g | Freq: Three times a day (TID) | INTRAVENOUS | Status: DC
  Filled 2021-02-15 (×3): qty 2

## 2021-02-15 MED ORDER — HEPARIN SOD (PORK) LOCK FLUSH 100 UNIT/ML IV SOLN
500.0000 [IU] | Freq: Once | INTRAVENOUS | Status: AC
  Administered 2021-02-15: 18:00:00 500 [IU] via INTRAVENOUS
  Filled 2021-02-15: qty 5

## 2021-02-15 NOTE — Consult Note (Signed)
[place] Nurse Consult Note: Patient receiving care in [facility]. Reason for Consult: chest wall wound Wound type: remnants of breast cancer area Pressure Injury POA: Yes/No/NA Measurement: Wound bed: See photo Drainage (amount, consistency, odor) I spoke with the patient's nurse, Raquel Sarna, via telephone and reviewed the most recent photos with her. She had the patient yesterday. The most recent photos of the area are very similar to what is present at this time. Raquel Sarna explained there is thick drainage from the wound. Periwound: Dressing procedure/placement/frequency: Place Mepitel Kellie Simmering [phone]) over the left chest wound. Then place Aquacel Kellie Simmering [phone]) over the [place]. Cover with an ABD pad, tape in place. THE MEPITEL CAN REMAIN IN PLACE UP TO 5 DAYS. THE AQUACEL AND ABD PAD MUST BE CHANGED DAILY.  Thank you for the consult.  Discussed plan of care with the bedside nurse.  [place] nurse will not follow at this time.  Please re-consult the [place] team if needed.  Val Riles, RN, MSN, CWOCN, CNS-BC, pager [phone]

## 2021-02-15 NOTE — Progress Notes (Addendum)
[facility]) Hospital Liaison Note  Received request from Transitions of Care Manager Dreama Saa, RN, for hospice services at home after discharge. Chart and patient information reviewed by [facility] physician. Hospice eligibility confirmed.  Spoke with sister Earline [place] to initiate education related to hospice philosophy, services and team approach to care. Patient/family verbalized understanding of information provided.   DME needs discussed. There are no DME needs at this time.  Please send signed and completed DNR home with patient/family. Please provide prescriptions at discharge as needed to ensure ongoing symptom management.   [facility] information and contact numbers given to family. Above information shared with [place].   Please do not hesitate to call with any hospice related questions or concerns.   Thank you for the opportunity to participate in this patient's care.   Bobbie "Loren Racer, RN, BSN [facility], [place] Liaison [phone]

## 2021-02-15 NOTE — TOC Progression Note (Signed)
Transition of Care [facility]) - Progression Note    Patient Details  Name: Tamara [name] MRN: [id number] Date of Birth: 1970-12-07  Transition of [facility]) CM/SW [place], RN Phone Number: 02/15/2021, 2:02 PM  Clinical Narrative:   Patient recommended to Hospice by care team. Sonia Baller at  [facility] notified.  Patient and family choose to go home with hospice.  TOC  To follow         Expected Discharge Plan and Services           Expected Discharge Date: 02/15/21                                     Social Determinants of Health (SDOH) Interventions    Readmission Risk Interventions No flowsheet data found.

## 2021-02-15 NOTE — Discharge Summary (Signed)
Physician Discharge Summary  Tamara [name] KGM:[id number] DOB: September 21, 1970 DOA: 02/13/2021  PCP: [facility] [place] date: 02/13/2021 Discharge date: 02/15/2021  Admitted From: Home  Discharge disposition: Home with hospice  Recommendations for Outpatient Follow-Up:   Follow up with your hospice care provider as outpatient.  Discharge Diagnosis:   Principal Problem:   Sepsis ([place]) Active Problems:   Schizophrenia, paranoid type ([place])   GERD (gastroesophageal reflux disease)   Carcinoma of overlapping sites of left breast in female, estrogen receptor negative ([place])   Breast cancer ([place])   ASCUS of cervix with negative high risk HPV   Bipolar affective disorder ([place])   Leiomyoma of uterus   Hypothyroidism   Weakness   SIADH (syndrome of inappropriate ADH production) ([facility])   Hypomagnesemia   Hypokalemia   AKI (acute kidney injury) ([place])   At high risk for infection due to neutropenia   Neutropenia ([place])   Discharge Condition: Improved.  Diet recommendation:   Regular.  Wound care: Continue at hospice.  Code status: DNR   History of Present Illness:   Tamara [name] is a 50 y.o. female with medical history significant for stage IV left breast cancer with positive contralateral right breast inflammatory/malignancy status postbiopsy, learning disability, currently on antineoplastic medication with Ivette Loyal, status postchemotherapy status post palliative radiation therapy, schizophrenia,  chronic hyponatremia presumed secondary to SIADH/psychiatric medication, to hospital from oncology office for concerns of lethargy.  She was found to have neutropenia with hypotension.  In the ED, patient was hypotensive with tachycardia. Labs in the emergency department was remarkable for serum sodium 125, potassium 3.2, chloride 89, bicarb 21, BUN 38, serum creatinine of 2.94, nonfasting blood glucose 100, WBC 0.8, hemoglobin 6.9, platelets 80, GFR 19.  Urine pregnancy test  was negative.  Magnesium level is 1.5.  In the ED, patient received Ringer lactate 1 L bolus followed by 150 mill per hour, IV cefepime vancomycin and metronidazole and was admitted hospital for further evaluation and treatment.   Hospital Course:   Following conditions were addressed during hospitalization as listed below,  Generalized malaise with neutropenia concerning for sepsis possibly secondary to Pseudomonas bacteremia and E. coli UTI. Patient had tachycardia, neutropenia, acute kidney injury with possible source being urine.  Blood cultures showed Pseudomonas in 1 bottle.  Patient had a stool test done and C. difficile was negative.  COVID was negative.  UA was abnormal.  GI pathogen panel showed some Campylobacter.    Procalcitonin was elevated at 5.4.  We will continue ciprofloxacin on discharge.   Pseudomonas bacteremia on blood culture, preliminary.  Received cefepime during hospitalization.   Campylobacter diarrhea.  C. difficile was negative.  Continue azithromycin for 2 more doses.  Continue antidiarrheal.    Neutropenia with absolute neutrophil count of 700.    Hypokalemia and hypomagnesemia Continue potassium supplement from home.   Chronic hyponatremia At baseline.  Baseline range of 123-129.  Sodium level of 131 at this time.     Acute on chronic anemia Patient has received 1 unit of packed RBC transfusion.     Ovoid sclerotic focus in the right sphenoid bone adjacent to right orbital apex - 8 mm in size.  Mother and sister are aware of this lesion and will need outpatient follow-up.   Learning disability-at baseline   Hypothyroidism.   Continue Synthroid   Thrombocytopenia Spectrum of pancytopenia.  We will continue to monitor.  No evidence of bleeding.  Latest platelet count of 53.   History  of schizophrenia, paranoid type-on Depakote 500 mg every 12 hours, haloperidol 5 mg p.o. twice daily,  patient is due for Haldol decanoate   Stage IV triple negative  left breast cancer-follows up with oncology as outpatient.  Multiple nodular lesions on the chest wall with ulceration.Marland Kitchen  Has been seen by oncology palliative care.  Patient has poor prognosis and is not a candidate for further chemotherapy and radiation treatment.  At this time family has decided for hospice at home..   Disposition.  At this time, patient is stable for disposition home with hospice.  Spoke with the patient's mother yesterday and spoke with the patient's sister today regarding the plan for disposition.  Medical Consultants:   Palliative care  Procedures:    Transfusion of 1 unit of packed RBC. Subjective:   Today, patient was seen and examined at bedside.  No interval complaints.  Discharge Exam:   Vitals:   02/15/21 0945 02/15/21 1206  BP: (!) 108/56 (!) 110/52  Pulse: 89   Resp: 16 16  Temp: 98.8 F (37.1 C) 97.9 F (36.6 C)  SpO2: 100% 100%   Vitals:   02/14/21 2334 02/15/21 0407 02/15/21 0945 02/15/21 1206  BP: (!) 103/58 (!) 107/53 (!) 108/56 (!) 110/52  Pulse: (!) 105 (!) 101 89   Resp: 17 16 16 16   Temp: 98 F (36.7 C) 98.4 F (36.9 C) 98.8 F (37.1 C) 97.9 F (36.6 C)  TempSrc:   Oral   SpO2: 97% 100% 100% 100%  Weight:      Height:       General: Alert awake, not in obvious distress HENT: pupils equally reacting to light,  No scleral pallor or icterus noted. Oral mucosa is moist.  Chest: Diminished breath sounds bilaterally.  Chest wall erythema nodular lesions and ulceration.  Right chest wall Port-A-Cath in place CVS: S1 &S2 heard. No murmur.  Regular rate and rhythm. Abdomen: Soft, nontender, nondistended.  Bowel sounds are heard.   Extremities: No cyanosis, clubbing or edema.  Peripheral pulses are palpable. Psych: Alert, awake and communicative CNS:  No cranial nerve deficits.  Power equal in all extremities.   Skin: Warm and dry.  Chest wall nodules, erythema, ulceration,  The results of significant diagnostics from this  hospitalization (including imaging, microbiology, ancillary and laboratory) are listed below for reference.     Diagnostic Studies:   CT HEAD WO CONTRAST (5MM)  Result Date: 02/13/2021 CLINICAL DATA:  Neuro deficit, acute, stroke suspected. Additional history provided: Patient from cancer center with increased weakness, hypotension, nausea/vomiting, altered mental status. EXAM: CT HEAD WITHOUT CONTRAST TECHNIQUE: Contiguous axial images were obtained from the base of the skull through the vertex without intravenous contrast. COMPARISON:  Brain MRI 05/12/2020. FINDINGS: Brain: Mild generalized cerebral and cerebellar atrophy. There is no acute intracranial hemorrhage. No demarcated cortical infarct. No extra-axial fluid collection. No evidence of an intracranial mass. No midline shift. Vascular: No hyperdense vessel.  Atherosclerotic calcifications. Skull: Nonspecific 8 mm ovoid sclerotic focus within the right sphenoid bone, adjacent to the right orbital apex (series 3, image 13). Otherwise unremarkable. Sinuses/Orbits: Visualized orbits show no acute finding. No significant paranasal sinus disease at the imaged levels. IMPRESSION: No evidence of acute intracranial abnormality. Mild generalized cerebral and cerebellar atrophy. 8 mm ovoid sclerotic focus within the right sphenoid bone, adjacent to the right orbital apex. This finding is nonspecific, but an osseous metastasis cannot be excluded given the patient's history of malignancy. 3-6 month CT follow-up recommended to ensure stability.  Electronically Signed   By: Kellie Simmering D.O.   On: 02/13/2021 15:38   DG Chest Port 1 View  Result Date: 02/13/2021 CLINICAL DATA:  Possible sepsis EXAM: PORTABLE CHEST 1 VIEW COMPARISON:  05/02/2020 FINDINGS: Right chest wall port catheter tip in similar position. Lung volumes are low. No new consolidation. No pleural effusion. Stable cardiomediastinal contours IMPRESSION: No acute process in the chest. Electronically  Signed   By: Macy Mis M.D.   On: 02/13/2021 14:59     Labs:   Basic Metabolic Panel: Recent Labs  Lab 02/13/21 1057 02/13/21 1115 02/14/21 0448 02/15/21 0544  NA 125*  --  125* 131*  K 3.2*  --  3.7 3.0*  CL 89*  --  95* 102  CO2 21*  --  21* 20*  GLUCOSE 100*  --  110* 102*  BUN 38*  --  21* 8  CREATININE 2.94*  --  1.05* 0.55  CALCIUM 8.6*  --  8.3* 8.2*  MG  --  1.5* 3.3* 2.3  PHOS  --   --  3.2 2.6   GFR Estimated Creatinine Clearance: 75.6 mL/min (by C-G formula based on SCr of 0.55 mg/dL). Liver Function Tests: Recent Labs  Lab 02/13/21 1057  AST 15  ALT 16  ALKPHOS 59  BILITOT 1.0  PROT 7.5  ALBUMIN 3.1*   No results for input(s): LIPASE, AMYLASE in the last 168 hours. No results for input(s): AMMONIA in the last 168 hours. Coagulation profile Recent Labs  Lab 02/14/21 0448  INR 1.4*    CBC: Recent Labs  Lab 02/13/21 1057 02/14/21 0448 02/15/21 0544  WBC 0.8* 0.8* 0.9*  NEUTROABS 0.7* 0.7*  --   HGB 6.9* 7.2* 7.2*  HCT 19.7* 20.3* 20.2*  MCV 87.2 85.3 86.7  PLT 80* 71* 53*   Cardiac Enzymes: No results for input(s): CKTOTAL, CKMB, CKMBINDEX, TROPONINI in the last 168 hours. BNP: Invalid input(s): POCBNP CBG: Recent Labs  Lab 02/14/21 2004  GLUCAP 94   D-Dimer No results for input(s): DDIMER in the last 72 hours. Hgb A1c No results for input(s): HGBA1C in the last 72 hours. Lipid Profile No results for input(s): CHOL, HDL, LDLCALC, TRIG, CHOLHDL, LDLDIRECT in the last 72 hours. Thyroid function studies No results for input(s): TSH, T4TOTAL, T3FREE, THYROIDAB in the last 72 hours.  Invalid input(s): FREET3 Anemia work up No results for input(s): VITAMINB12, FOLATE, FERRITIN, TIBC, IRON, RETICCTPCT in the last 72 hours. Microbiology Recent Results (from the past 240 hour(s))  Urine Culture     Status: Abnormal (Preliminary result)   Collection Time: 02/13/21  2:59 PM   Specimen: Urine, Clean Catch  Result Value Ref Range  Status   Specimen Description   Final    URINE, CLEAN CATCH Performed at [facility], [address] [place], [place] [address]    Special Requests   Final    NONE Performed at [facility], [address], [place], [place] [address]    Culture (A)  Final    >=100,000 COLONIES/mL ESCHERICHIA COLI CULTURE REINCUBATED FOR BETTER GROWTH Performed at [facility], [place] [address], [place], [place] [address]    Report Status PENDING  Incomplete  Blood Culture (routine x 2)     Status: Abnormal (Preliminary result)   Collection Time: 02/13/21  3:50 PM   Specimen: BLOOD  Result Value Ref Range Status   Specimen Description   Final    BLOOD PORTA CATH Performed at [facility], [address] [place], [place] [address]    Special Requests   Final    BOTTLES DRAWN AEROBIC AND ANAEROBIC Blood Culture adequate volume Performed at [facility], [address] [place], [place] [address]    Culture  Setup Time   Final    AEROBIC BOTTLE ONLY GRAM NEGATIVE RODS Organism ID to follow CRITICAL RESULT CALLED TO, READ BACK BY AND VERIFIED WITH: SUSAN WATSON 02/14/21 1054 KLW Performed at [facility], [address] [place], [place] [address]    Culture (A)  Final    PSEUDOMONAS AERUGINOSA SUSCEPTIBILITIES TO FOLLOW Performed at [facility], [place] [address] [place], [place] [address]    Report Status PENDING  Incomplete  Blood Culture ID Panel (Reflexed)     Status: Abnormal   Collection Time: 02/13/21  3:50 PM  Result Value Ref Range Status   Enterococcus faecalis NOT DETECTED NOT DETECTED Final   Enterococcus Faecium NOT DETECTED NOT DETECTED Final   Listeria monocytogenes NOT DETECTED NOT DETECTED Final   Staphylococcus species NOT DETECTED NOT DETECTED Final   Staphylococcus aureus (BCID) NOT DETECTED NOT DETECTED Final   Staphylococcus epidermidis NOT DETECTED NOT DETECTED Final   Staphylococcus lugdunensis NOT  DETECTED NOT DETECTED Final   Streptococcus species NOT DETECTED NOT DETECTED Final   Streptococcus agalactiae NOT DETECTED NOT DETECTED Final   Streptococcus pneumoniae NOT DETECTED NOT DETECTED Final   Streptococcus pyogenes NOT DETECTED NOT DETECTED Final   A.calcoaceticus-baumannii NOT DETECTED NOT DETECTED Final   Bacteroides fragilis NOT DETECTED NOT DETECTED Final   Enterobacterales NOT DETECTED NOT DETECTED Final   Enterobacter cloacae complex NOT DETECTED NOT DETECTED Final   Escherichia coli NOT DETECTED NOT DETECTED Final   Klebsiella aerogenes NOT DETECTED NOT DETECTED Final   Klebsiella oxytoca NOT DETECTED NOT DETECTED Final   Klebsiella pneumoniae NOT DETECTED NOT DETECTED Final   Proteus species NOT DETECTED NOT DETECTED Final   Salmonella species NOT DETECTED NOT DETECTED Final   Serratia marcescens NOT DETECTED NOT DETECTED Final   Haemophilus influenzae NOT DETECTED NOT DETECTED Final   Neisseria meningitidis NOT DETECTED NOT DETECTED Final   Pseudomonas aeruginosa DETECTED (A) NOT DETECTED Final    Comment: CRITICAL RESULT CALLED TO, READ BACK BY AND VERIFIED WITH: SUSAN WATSON 02/14/21 1054 KLW    Stenotrophomonas maltophilia NOT DETECTED NOT DETECTED Final   Candida albicans NOT DETECTED NOT DETECTED Final   Candida auris NOT DETECTED NOT DETECTED Final   Candida glabrata NOT DETECTED NOT DETECTED Final   Candida krusei NOT DETECTED NOT DETECTED Final   Candida parapsilosis NOT DETECTED NOT DETECTED Final   Candida tropicalis NOT DETECTED NOT DETECTED Final   Cryptococcus neoformans/gattii NOT DETECTED NOT DETECTED Final   CTX-M ESBL NOT DETECTED NOT DETECTED Final   Carbapenem resistance IMP NOT DETECTED NOT DETECTED Final   Carbapenem resistance KPC NOT DETECTED NOT DETECTED Final   Carbapenem resistance NDM NOT DETECTED NOT DETECTED Final   Carbapenem resistance VIM NOT DETECTED NOT DETECTED Final    Comment: Performed at [facility], [address] [place], [place] [address]  Resp Panel by RT-PCR (Flu A&B, Covid) Nasopharyngeal Swab     Status: None   Collection Time: 02/13/21  4:45 PM   Specimen: Nasopharyngeal Swab; Nasopharyngeal(NP) swabs in vial transport medium  Result Value Ref Range Status   SARS Coronavirus 2 by RT PCR NEGATIVE NEGATIVE Final    Comment: (NOTE) SARS-CoV-2 target nucleic acids are NOT DETECTED.  The SARS-CoV-2 RNA is generally detectable in upper respiratory specimens during the acute phase of infection. The lowest concentration of SARS-CoV-2 viral copies this assay can detect is 138 copies/mL. A negative result does not preclude SARS-Cov-2 infection and should not be used as the sole basis for treatment or other patient management decisions. A negative result may occur with  improper specimen collection/handling, submission of specimen other than nasopharyngeal swab, presence of viral mutation(s) within the areas targeted by this assay, and inadequate number of viral copies(<138 copies/mL). A negative result must be combined with clinical observations, patient history, and epidemiological information. The expected result is Negative.  Fact Sheet for Patients:  EntrepreneurPulse.com.au  Fact Sheet for Healthcare Providers:  IncredibleEmployment.be  This test is no t yet approved or cleared by the [place] FDA and  has been authorized for detection and/or diagnosis of SARS-CoV-2 by FDA under an Emergency Use Authorization (EUA). This EUA will remain  in effect (meaning this test can be used) for the duration of the COVID-19 declaration under Section 564(b)(1) of the Act, 21 U.S.C.section 360bbb-3(b)(1), unless the authorization is terminated  or revoked sooner.       Influenza A by PCR NEGATIVE NEGATIVE Final   Influenza B by PCR NEGATIVE NEGATIVE Final    Comment: (NOTE) The Xpert Xpress SARS-CoV-2/FLU/RSV plus assay is intended as an aid in the  diagnosis of influenza from Nasopharyngeal swab specimens and should not be used as a sole basis for treatment. Nasal washings and aspirates are unacceptable for Xpert Xpress SARS-CoV-2/FLU/RSV testing.  Fact Sheet for Patients: EntrepreneurPulse.com.au  Fact Sheet for Healthcare Providers: IncredibleEmployment.be  This test is not yet approved or cleared by the [place] FDA and has been authorized for detection and/or diagnosis of SARS-CoV-2 by FDA under an Emergency Use Authorization (EUA). This EUA will remain in effect (meaning this test can be used) for the duration of the COVID-19 declaration under Section 564(b)(1) of the Act, 21 U.S.C. section 360bbb-3(b)(1), unless the authorization is terminated or revoked.  Performed at [facility], [place], [place] [address]   C Difficile Quick Screen w PCR reflex     Status: None   Collection Time: 02/14/21 12:08 AM   Specimen: STOOL  Result Value Ref Range Status   C Diff antigen NEGATIVE NEGATIVE Final   C Diff toxin NEGATIVE NEGATIVE Final   C Diff interpretation No C. difficile detected.  Final    Comment: Performed at [facility], [address] [place], [place] [address]  Gastrointestinal Panel by PCR , Stool     Status: Abnormal   Collection Time: 02/14/21 12:08 AM   Specimen: STOOL  Result Value Ref Range Status   Campylobacter species DETECTED (A) NOT DETECTED Final    Comment: RESULT CALLED TO, READ BACK BY AND VERIFIED WITH: SARA SCHIFFELBEIN@[id number]  02/14/21 RH    Plesimonas shigelloides NOT DETECTED NOT DETECTED Final   Salmonella species NOT DETECTED NOT DETECTED Final   Yersinia enterocolitica NOT DETECTED NOT DETECTED Final   Vibrio species NOT DETECTED NOT DETECTED Final   Vibrio cholerae NOT DETECTED NOT DETECTED Final   Enteroaggregative E coli (EAEC) NOT DETECTED NOT DETECTED Final   Enteropathogenic E coli (EPEC) NOT DETECTED NOT  DETECTED Final   Enterotoxigenic E coli (ETEC) NOT DETECTED NOT DETECTED Final   Shiga like toxin producing E coli (STEC) NOT DETECTED NOT DETECTED Final   Shigella/Enteroinvasive E coli (EIEC) NOT DETECTED NOT DETECTED Final   Cryptosporidium NOT DETECTED  NOT DETECTED Final   Cyclospora cayetanensis NOT DETECTED NOT DETECTED Final   Entamoeba histolytica NOT DETECTED NOT DETECTED Final   Giardia lamblia NOT DETECTED NOT DETECTED Final   Adenovirus F40/41 NOT DETECTED NOT DETECTED Final   Astrovirus NOT DETECTED NOT DETECTED Final   Norovirus GI/GII NOT DETECTED NOT DETECTED Final   Rotavirus A NOT DETECTED NOT DETECTED Final   Sapovirus (I, II, IV, and V) NOT DETECTED NOT DETECTED Final    Comment: Performed at [facility], [address] [place],  [address]   Allergies as of 02/15/2021       Reactions   Paclitaxel Anaphylaxis   Hypersensitivity reaction [flushing; shortness of breath; drop in blood pressure; tachycardia]        Medication List     TAKE these medications    azithromycin 500 MG tablet Commonly known as: ZITHROMAX Take 1 tablet (500 mg total) by mouth daily for 2 days. Start taking on: February 16, 2021   benztropine 0.5 MG tablet Commonly known as: COGENTIN Take 1 tablet (0.5 mg total) by mouth 2 (two) times daily.   cetirizine 10 MG tablet Commonly known as: ZYRTEC Take 1 tablet (10 mg total) by mouth daily as needed for allergies.   ciprofloxacin 500 MG tablet Commonly known as: Cipro Take 1 tablet (500 mg total) by mouth 2 (two) times daily for 7 days.   clonazePAM 0.5 MG tablet Commonly known as: KLONOPIN Take 0.5 mg by mouth at bedtime.   diphenoxylate-atropine 2.5-0.025 MG tablet Commonly known as: LOMOTIL Take 1 tablet by mouth 4 (four) times daily as needed for diarrhea or loose stools. Take it along with immodium   divalproex 500 MG DR tablet Commonly known as: DEPAKOTE Take 1 tablet (500 mg total) by mouth every 12  (twelve) hours.   fluticasone 50 MCG/ACT nasal spray Commonly known as: Flonase Place 1 spray into both nostrils daily. What changed:  when to take this reasons to take this   haloperidol 5 MG tablet Commonly known as: HALDOL Take 1 tablet (5 mg total) by mouth 3 (three) times daily. What changed: when to take this   haloperidol decanoate 100 MG/ML injection Commonly known as: HALDOL DECANOATE Inject 100 mg into the muscle every 28 (twenty-eight) days.   HYDROcodone-acetaminophen 5-325 MG tablet Commonly known as: NORCO/VICODIN Take 1 tablet by mouth every 12 (twelve) hours as needed for moderate pain. 1 pill every 12 hours as needed   hydrOXYzine 25 MG tablet Commonly known as: ATARAX/VISTARIL Take 1 tablet (25 mg total) by mouth every 8 (eight) hours as needed.   Klor-Con M20 20 MEQ tablet Generic drug: potassium chloride SA TAKE 1 TABLET BY MOUTH TWICE A DAY   levothyroxine 88 MCG tablet Commonly known as: SYNTHROID TAKE 1 TABLET (88 MCG TOTAL) BY MOUTH DAILY AT 6 (SIX) AM.   lidocaine-prilocaine cream Commonly known as: EMLA Apply 1 application topically as needed.   medroxyPROGESTERone 150 MG/ML injection Commonly known as: DEPO-PROVERA Inject 150 mg into the muscle every 3 (three) months.   MELATONIN PO Take 1 tablet by mouth at bedtime as needed (sleep).   ondansetron 8 MG tablet Commonly known as: ZOFRAN Take 1 tablet (8 mg total) by mouth every 8 (eight) hours as needed for nausea or vomiting.   prochlorperazine 10 MG tablet Commonly known as: COMPAZINE TAKE 1 TABLET BY MOUTH EVERY 6 HOURS AS NEEDED FOR NAUSEA OR VOMITING.   silver sulfADIAZINE 1 % cream Commonly known as: Silvadene Apply  1 application topically daily.   sodium chloride 1 g tablet Take 1 tablet (1 g total) by mouth 3 (three) times daily.               Discharge Care Instructions  (From admission, onward)           Start     Ordered   02/15/21 0000  Discharge wound  care:       Comments: Place Mepitel Kellie Simmering [phone]) over the left chest wound. Then place Aquacel Kellie Simmering [phone]) over the [place]. Cover with an ABD pad, tape in place. THE MEPITEL CAN REMAIN IN PLACE UP TO 5 DAYS. THE AQUACEL AND ABD PAD MUST BE CHANGED DAILY.   02/15/21 1315             Discharge Instructions:   Discharge Instructions     Diet general   Complete by: As directed    Discharge instructions   Complete by: As directed    Follow-up with your hospice care provider as outpatient.   Discharge wound care:   Complete by: As directed    Place Mepitel Kellie Simmering [phone]) over the left chest wound. Then place Aquacel Kellie Simmering [phone]) over the [place]. Cover with an ABD pad, tape in place. THE MEPITEL CAN REMAIN IN PLACE UP TO 5 DAYS. THE AQUACEL AND ABD PAD MUST BE CHANGED DAILY.   Increase activity slowly   Complete by: As directed         Time coordinating discharge: 39 minutes  Signed:  Mayumi Summerson  [facility] 02/15/2021, 1:15 PM

## 2021-02-15 NOTE — Progress Notes (Signed)
PHARMACY NOTE:  ANTIMICROBIAL RENAL DOSAGE ADJUSTMENT  Current antimicrobial regimen includes a mismatch between antimicrobial dosage and estimated renal function.  As per policy approved by the Pharmacy & Therapeutics and Medical Executive Committees, the antimicrobial dosage will be adjusted accordingly.  Current antimicrobial dosage:  Cefepime 2 gm IV q 12 hours  Indication: Pseudomonas bacteremia  Renal Function:  Estimated Creatinine Clearance: 75.6 mL/min (by C-G formula based on SCr of 0.55 mg/dL). []      On intermittent HD, scheduled: []      On CRRT    Antimicrobial dosage has been changed to:  Cefepime 2 gm IV Q 8 hours   Additional comments:   Thank you for allowing pharmacy to be a part of this patient's care.  Jimmy Footman, PharmD, BCPS, [facility] Clinical Pharmacist Phone: [phone] 02/15/2021 10:22 AM

## 2021-02-15 NOTE — Progress Notes (Signed)
Tamara [name]   DOB:1971-01-19   AJ#:[id number]    Subjective: Patient denies any significant discomfort.  She is mentally more alert.  Met with hospice.  Objective:  Vitals:   02/15/21 0945 02/15/21 1206  BP: (!) 108/56 (!) 110/52  Pulse: 89   Resp: 16 16  Temp: 98.8 F (37.1 C) 97.9 F (36.6 C)  SpO2: 100% 100%     Intake/Output Summary (Last 24 hours) at 02/15/2021 2120 Last data filed at 02/15/2021 1617 Gross per 24 hour  Intake 3100.44 ml  Output --  Net 3100.44 ml    Physical Exam Vitals and nursing note reviewed.  HENT:     Head: Normocephalic and atraumatic.     Mouth/Throat:     Pharynx: Oropharynx is clear.  Eyes:     Extraocular Movements: Extraocular movements intact.     Pupils: Pupils are equal, round, and reactive to light.  Cardiovascular:     Rate and Rhythm: Normal rate and regular rhythm.  Pulmonary:     Comments: Decreased breath sounds bilaterally.  Abdominal:     Palpations: Abdomen is soft.  Musculoskeletal:        General: Normal range of motion.     Cervical back: Normal range of motion.  Skin:    General: Skin is warm.  Neurological:     General: No focal deficit present.     Mental Status: She is alert and oriented to person, place, and time.  Psychiatric:     Comments: Poor insight/judgment.     Labs:  Lab Results  Component Value Date   WBC 0.9 (LL) 02/15/2021   HGB 7.2 (L) 02/15/2021   HCT 20.2 (L) 02/15/2021   MCV 86.7 02/15/2021   PLT 53 (L) 02/15/2021   NEUTROABS 0.7 (L) 02/14/2021    Lab Results  Component Value Date   NA 131 (L) 02/15/2021   K 3.0 (L) 02/15/2021   CL 102 02/15/2021   CO2 20 (L) 02/15/2021    Studies:  No results found.  Carcinoma of overlapping sites of left breast in female, estrogen receptor negative ([place]) #50 year old female patient with a history of schizophrenia and metastatic/stage IV breast cancer currently on palliative chemotherapy is currently admitted to hospital for severe  neutropenia/sepsis/mental status changes.  # Stage IV -left breast cancer T4 N2 M1 [subcutaneous nodules].  Triple negative- positive for contralateral right breast inflammatory/malignancy status postbiopsy.  The patient currently on Trodelvy; patient currently s/p cycle #1- day-8 [on 9/16]; growth factor on 9/19.  Poor tolerance to therapy-recommend hospice.  Discussed with patient's Sister [name]; the agreement.  #Neutropenic sepsis/bacteremia with Pseudomonas/diarrhea-ciprofloxacin.;  Campylobacter-azithromycin as per ID.  # Electrolytes abnormalities: chronic hyponatremia-sodium 125 likely secondary to dehydration/SIADH/psychiatric medication/diarrhea from chemotherapy  # Mental status changes-likely metabolic encephalopathy/-improved.  Discussed with the sister and they are interested in follow-up in the cancer center/or else the patient will be followed by hospice at home.  Cammie Sickle, MD 02/15/2021  9:20 PM

## 2021-02-15 NOTE — Progress Notes (Signed)
Changed patient dressing to left breast. Applied mepitel dressing along with aquacel, ABD pads and paper tape. Will send those instructions with family for D/C home. Removed IV to right AC, flushed port with heparin before removing access and D/C home. Called sister, [name] and notified of instructions along with medication add-ons. She stated she couldn't come get patient until 1730.

## 2021-02-15 NOTE — Progress Notes (Signed)
Consult to wound care placed yesterday by dayshift nurse. Wound covered by day nurse. Upon giving AM medications, nurse noticed purulent drainage on dressing. Wound is very large with a moderate amount of purulent drainage. No wound care management order in chart. Overnight doctor messaged and a wet to dry dressing was recommended. Wound was dressed.

## 2021-02-15 NOTE — Progress Notes (Signed)
     Referral received for Tamara [name] for goals of care discussion. Chart reviewed and updates received from RN, hospitalist.  Patient sent to hospital by Billey Chang, NP with outpatient palliative medicine at [facility]. Review of chart shows oncology has discussed recommendation for hospice with family, who agreed.  Discussed patient with Merrily Pew, who knows her well. Informed him of likely hospice. He recommended residential hospice over home hospice. States he will see the patient this afternoon if able or on Monday if she's still here.  Later in the day TOC/ACC states patient's family has refused residential hospice and accepted home hospice. Billey Chang, NP was updated. Planning d/c, possibly today.  Thank you for your referral and allowing [facility] to assist in [place]'s care.   Walden Field, NP Palliative Medicine Team Phone: [phone]  NO CHARGE

## 2021-02-16 ENCOUNTER — Encounter: Payer: Self-pay | Admitting: Internal Medicine

## 2021-02-16 LAB — URINE CULTURE: Culture: >=100000 — AB

## 2021-02-16 LAB — CULTURE, BLOOD (ROUTINE X 2): Special Requests: ADEQUATE

## 2021-02-18 ENCOUNTER — Other Ambulatory Visit: Payer: Self-pay | Admitting: *Deleted

## 2021-02-18 MED ORDER — POTASSIUM CHLORIDE CRYS ER 20 MEQ PO TBCR: 20.0000 meq | EXTENDED_RELEASE_TABLET | Freq: Two times a day (BID) | ORAL | 0 refills | Status: DC

## 2021-02-22 ENCOUNTER — Inpatient Hospital Stay: Payer: Medicaid Other

## 2021-02-22 ENCOUNTER — Inpatient Hospital Stay: Payer: Medicaid Other | Admitting: Internal Medicine

## 2021-03-01 ENCOUNTER — Encounter: Payer: Self-pay | Admitting: Internal Medicine

## 2021-03-01 ENCOUNTER — Ambulatory Visit: Payer: Medicaid Other

## 2021-03-01 ENCOUNTER — Other Ambulatory Visit: Payer: Medicaid Other

## 2021-03-01 ENCOUNTER — Ambulatory Visit: Payer: Medicaid Other | Admitting: Internal Medicine

## 2021-03-04 ENCOUNTER — Other Ambulatory Visit: Payer: Self-pay | Admitting: *Deleted

## 2021-03-04 MED ORDER — PROCHLORPERAZINE MALEATE 10 MG PO TABS: 10.0000 mg | ORAL_TABLET | Freq: Four times a day (QID) | ORAL | 1 refills | Status: DC | PRN

## 2021-03-04 MED ORDER — PROCHLORPERAZINE MALEATE 10 MG PO TABS: 10.0000 mg | ORAL_TABLET | Freq: Four times a day (QID) | ORAL | 1 refills | Status: AC | PRN

## 2021-03-04 MED ORDER — HYDROCODONE-ACETAMINOPHEN 5-325 MG PO TABS: 1.0000 | ORAL_TABLET | Freq: Two times a day (BID) | ORAL | 0 refills | Status: DC | PRN

## 2021-03-11 ENCOUNTER — Other Ambulatory Visit: Payer: Self-pay | Admitting: *Deleted

## 2021-03-11 MED ORDER — HALOPERIDOL 5 MG PO TABS: 2.5000 mg | ORAL_TABLET | Freq: Four times a day (QID) | ORAL | 1 refills | Status: AC | PRN

## 2021-03-19 ENCOUNTER — Other Ambulatory Visit: Payer: Self-pay | Admitting: Internal Medicine

## 2021-03-29 ENCOUNTER — Telehealth: Payer: Self-pay | Admitting: *Deleted

## 2021-03-29 MED ORDER — VALACYCLOVIR HCL 1 G PO TABS: 1000.0000 mg | ORAL_TABLET | Freq: Three times a day (TID) | ORAL | 0 refills | Status: AC

## 2021-03-29 NOTE — Telephone Encounter (Signed)
Rusty with Hospice called reporting that patient has shingles on her left arm and side and is asking if we will order medicine for it. He states that he can send pics if needed of the area. Please Arnoldo Hooker

## 2021-03-29 NOTE — Telephone Encounter (Signed)
Per Stormy Card, He initially thought Monday that she had bed bug bites due to the condition of the home, but now it appears to be shingles, It is blistering, but not crusted yet looks like it will be soon crusting

## 2021-04-03 ENCOUNTER — Other Ambulatory Visit: Payer: Self-pay | Admitting: *Deleted

## 2021-04-03 MED ORDER — HYDROCODONE-ACETAMINOPHEN 5-325 MG PO TABS: 1.0000 | ORAL_TABLET | Freq: Two times a day (BID) | ORAL | 0 refills | Status: DC | PRN

## 2021-04-08 ENCOUNTER — Other Ambulatory Visit: Payer: Self-pay | Admitting: Internal Medicine

## 2021-04-08 DIAGNOSIS — C50812 Malignant neoplasm of overlapping sites of left female breast: Secondary | ICD-10-CM

## 2021-04-08 DIAGNOSIS — Z171 Estrogen receptor negative status [ER-]: Secondary | ICD-10-CM

## 2021-04-09 ENCOUNTER — Other Ambulatory Visit: Payer: Self-pay

## 2021-04-09 ENCOUNTER — Other Ambulatory Visit: Payer: Self-pay | Admitting: Internal Medicine

## 2021-04-09 ENCOUNTER — Encounter: Payer: Self-pay | Admitting: Internal Medicine

## 2021-04-09 MED ORDER — SODIUM CHLORIDE 1 G PO TABS
1.0000 g | ORAL_TABLET | Freq: Three times a day (TID) | ORAL | 0 refills | Status: DC
  Filled 2021-04-09: qty 90, 30d supply, fill #0

## 2021-04-10 ENCOUNTER — Encounter: Payer: Self-pay | Admitting: Internal Medicine

## 2021-04-11 ENCOUNTER — Other Ambulatory Visit: Payer: Self-pay

## 2021-04-11 DIAGNOSIS — Z171 Estrogen receptor negative status [ER-]: Secondary | ICD-10-CM

## 2021-04-11 MED ORDER — CETIRIZINE HCL 10 MG PO TABS: 10.0000 mg | ORAL_TABLET | Freq: Every day | ORAL | Status: AC | PRN

## 2021-04-11 MED ORDER — SODIUM CHLORIDE 1 G PO TABS: 1.0000 g | ORAL_TABLET | Freq: Three times a day (TID) | ORAL | 0 refills | Status: AC

## 2021-04-11 NOTE — Telephone Encounter (Signed)
Patient can get these prescriptions filled at [facility] (pharmacy changed) at a more affordable price using [facility].

## 2021-04-15 ENCOUNTER — Other Ambulatory Visit: Payer: Self-pay

## 2021-04-16 ENCOUNTER — Other Ambulatory Visit: Payer: Self-pay | Admitting: Internal Medicine

## 2021-04-16 NOTE — Telephone Encounter (Signed)
  Component Ref Range & Units 2 mo ago  (02/15/21) 2 mo ago  (02/14/21) 2 mo ago  (02/13/21) 2 mo ago  (02/08/21) 2 mo ago  (02/01/21) 2 mo ago  (01/25/21) 2 mo ago  (01/18/21)  Potassium 3.5 - 5.1 mmol/L 3.0 Low   3.7  3.2 Low   3.3 Low   3.5  3.5  3.3 Low

## 2021-04-17 ENCOUNTER — Encounter: Payer: Self-pay | Admitting: Internal Medicine

## 2021-04-17 ENCOUNTER — Other Ambulatory Visit: Payer: Self-pay

## 2021-04-17 DIAGNOSIS — C50812 Malignant neoplasm of overlapping sites of left female breast: Secondary | ICD-10-CM

## 2021-04-17 DIAGNOSIS — Z171 Estrogen receptor negative status [ER-]: Secondary | ICD-10-CM

## 2021-04-22 ENCOUNTER — Encounter: Payer: Self-pay | Admitting: Internal Medicine

## 2021-04-25 ENCOUNTER — Other Ambulatory Visit: Payer: Self-pay | Admitting: Hospice and Palliative Medicine

## 2021-04-25 MED ORDER — HYDROCODONE-ACETAMINOPHEN 5-325 MG PO TABS: 1.0000 | ORAL_TABLET | ORAL | 0 refills | Status: AC | PRN

## 2021-04-25 NOTE — Progress Notes (Signed)
Received a call from patient's hospice nurse, Rusty.  He says that patient has had worsening fungating disease from breast cancer.  Pain is subsequently also been worse.  Overall, patient is declining.  We discussed future involvement of the hospice home when appropriate.  We will liberalize her Norco and send refill.

## 2021-05-26 DEATH — deceased

## 2021-05-29 ENCOUNTER — Encounter: Payer: Self-pay | Admitting: Internal Medicine

## 2021-05-29 NOTE — Progress Notes (Signed)
I spoke to Patients sister and expressed my condolences. Very thank you for the call.

## 2021-07-28 IMAGING — US US BREAST*L* LIMITED INC AXILLA
1 series · 12 of 12 positions shown · non-contrast
Comparison: 04/03/2020 ultrasound.

CLINICAL DATA: 49-year-old female for follow-up of known LEFT
breast cancer and lymph node metastases, currently on neoadjuvant
therapy.

EXAM:
DIGITAL DIAGNOSTIC BILATERAL MAMMOGRAM WITH TOMOSYNTHESIS AND CAD;
ULTRASOUND LEFT BREAST LIMITED
TECHNIQUE: Bilateral digital diagnostic mammography and breast tomosynthesis
was performed. The images were evaluated with computer-aided
detection.; Targeted ultrasound examination of the left breast was
performed

[Series 1: us breast*left* limited inc axilla · 0.09mm/px · 12 of 12 slices shown]
[im 1/12]
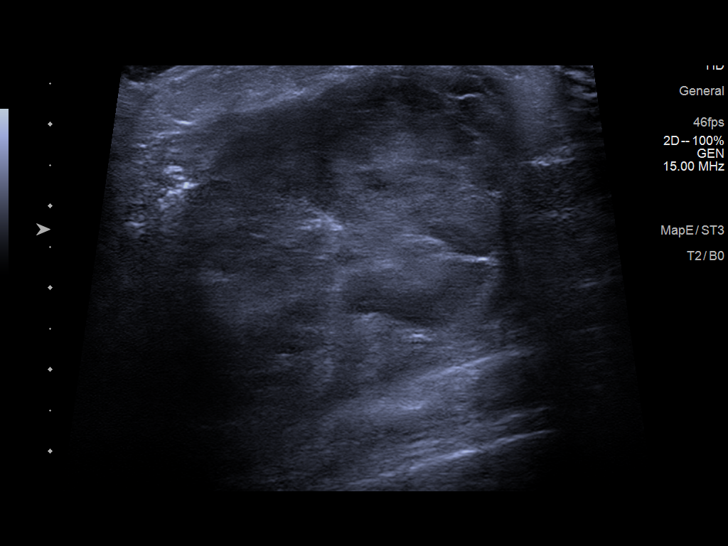
[im 2/12]
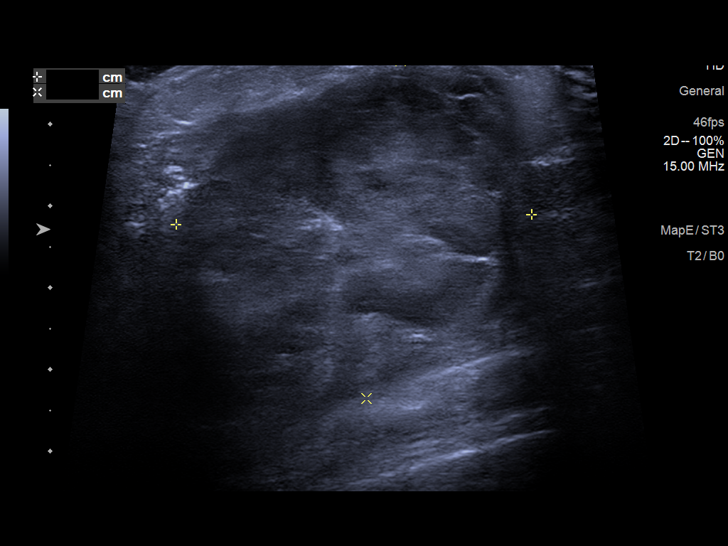
[im 3/12]
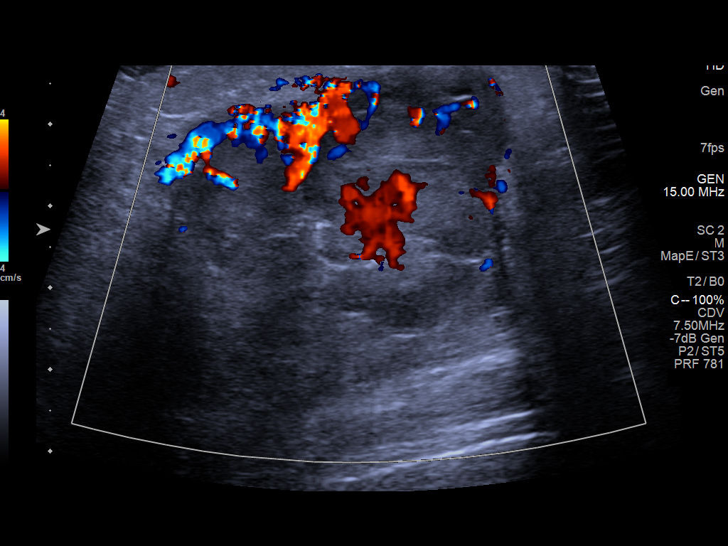
[im 4/12]
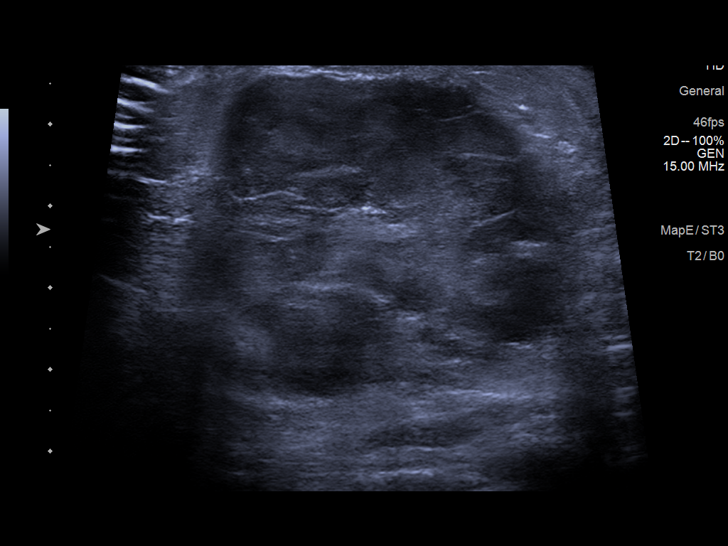
[im 5/12]
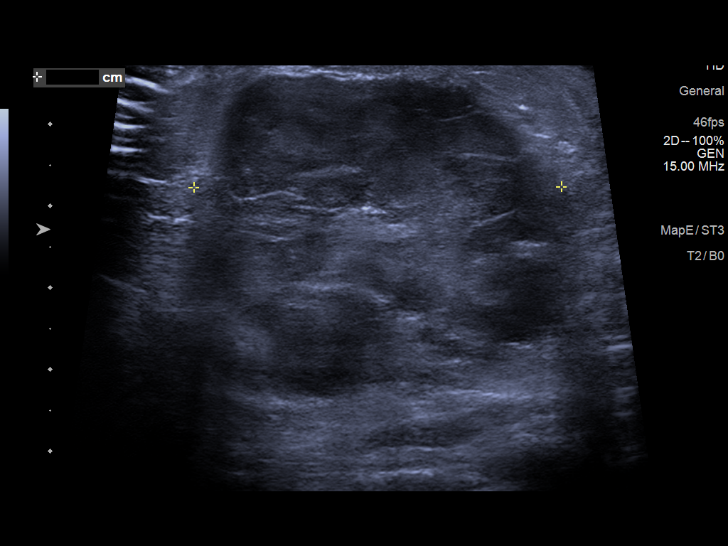
[im 6/12]
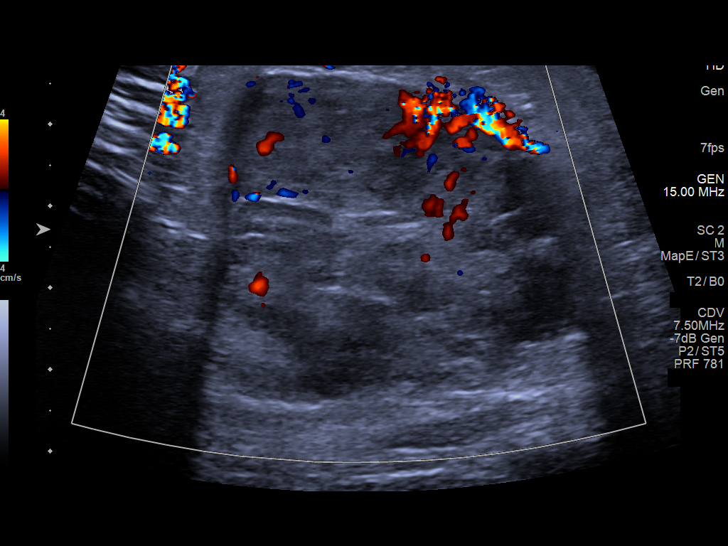
[im 7/12]
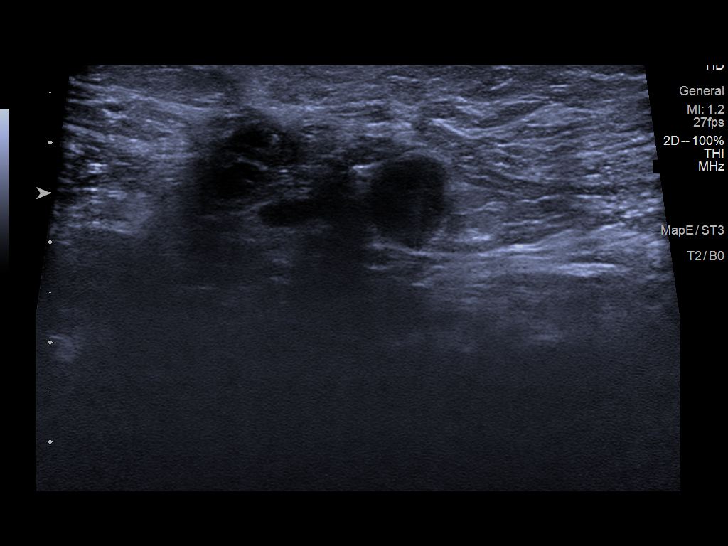
[im 8/12]
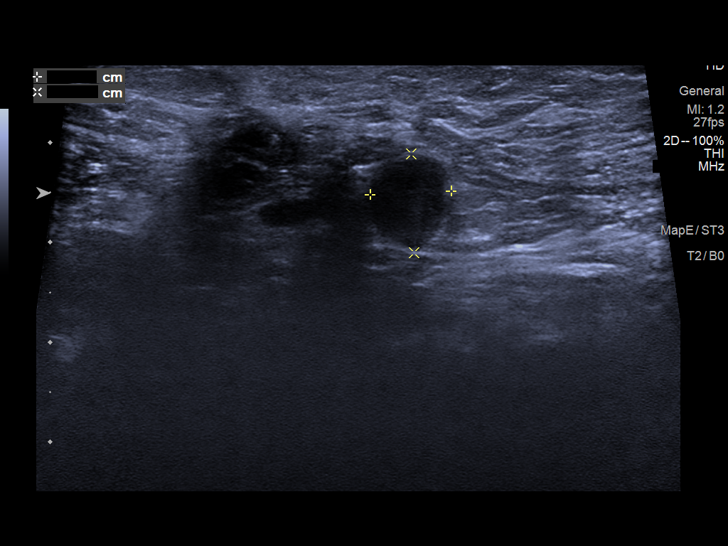
[im 9/12]
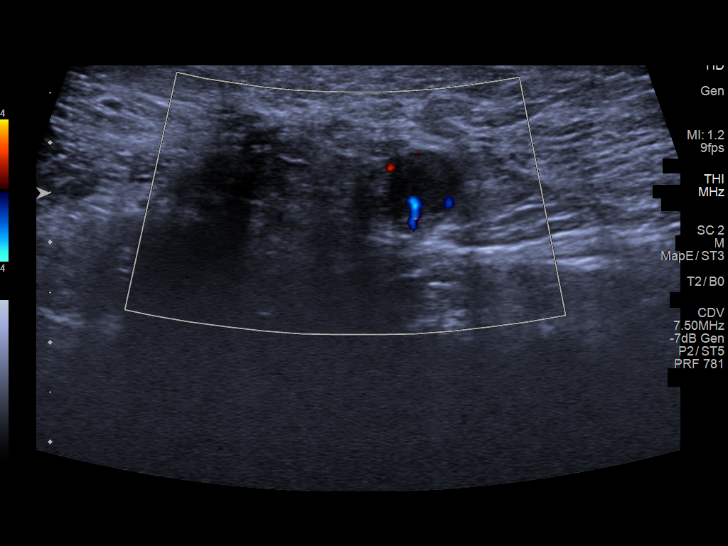
[im 10/12]
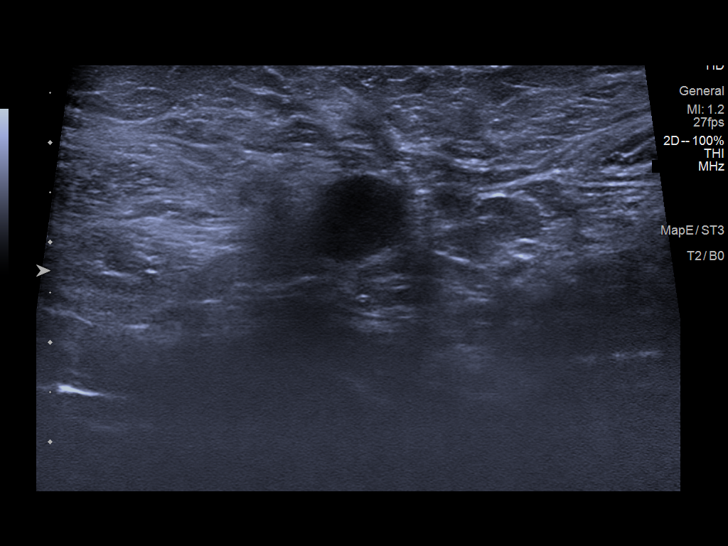
[im 11/12]
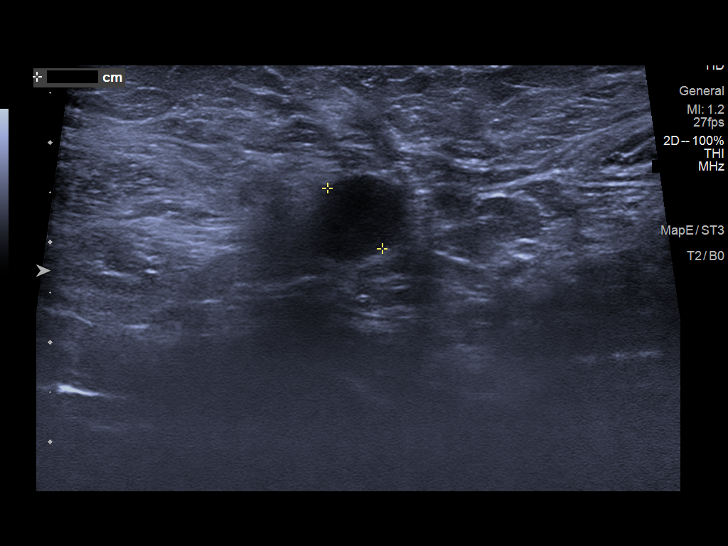
[im 12/12]
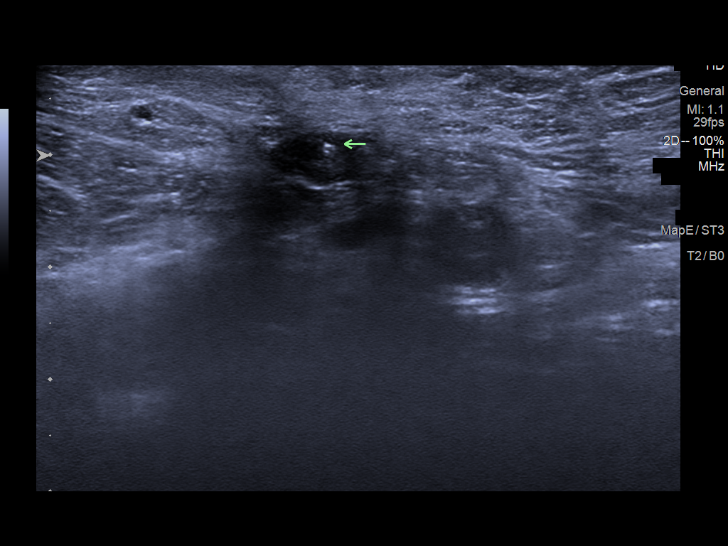

[12 of 12 positions shown; findings below may reference images not displayed]

No prior mammograms are
available for comparison.

ACR Breast Density Category b: There are scattered areas of
fibroglandular density.
FINDINGS: 2D/3D full field views of both breasts demonstrate a large obscured
mass within the UPPER OUTER LEFT breast, containing a biopsy clip.
Trabecular thickening within the central and UPPER OUTER LEFT breast
noted.

No suspicious mammographic findings within the RIGHT breast are
noted.

Targeted ultrasound is performed, showing a 4.4 x 4.2 x 4.5 cm
mass/malignancy at the 1 o'clock position of the LEFT breast 4 cm
from the nipple, previously 6.3 x 6.2 x 7.1 cm on 04/03/2020.

Enlarged LEFT axillary lymph nodes have decreased in size, the
largest now with 0.8 cm of cortical thickness, previously 2.6 cm.
IMPRESSION: 1. Decreased size of known malignancy within the UPPER-OUTER LEFT
breast, now measuring 4.5 cm in greatest diameter, previously
cm.
2. Decreased size of LEFT axillary lymph nodes, now with largest
measuring 0.8 cm of cortical thickness, previously 2.6 cm.
3. No mammographic evidence of RIGHT breast malignancy.

RECOMMENDATION:
Treatment plan

I have discussed the findings and recommendations with the patient.
If applicable, a reminder letter will be sent to the patient
regarding the next appointment.

BI-RADS CATEGORY  6: Known biopsy-proven malignancy.

## 2021-09-28 IMAGING — PT NM PET TUM IMG RESTAG (PS) SKULL BASE T - THIGH
1 of 9 series · 1 of 25 positions shown · non-contrast
Comparison: Chest abdomen and pelvic CTs of 10/15/2020. Most recent
PET of 05/02/2020 for

CLINICAL DATA: Subsequent treatment strategy for left breast
cancer. Evaluate treatment response. Chemotherapy currently on hold.

EXAM:
NUCLEAR MEDICINE PET SKULL BASE TO THIGH
TECHNIQUE: 8.3 mCi F-18 FDG was injected intravenously. Full-ring PET imaging
was performed from the skull base to thigh after the radiotracer. CT
data was obtained and used for attenuation correction and anatomic
localization.
Fasting blood glucose: 82 mg/dl

[Series 3: ct wb 5.0 b30f · axial · 5.0mm · 0.98mm/px · 1 of 251 slices shown]
[im 251/251  brain]
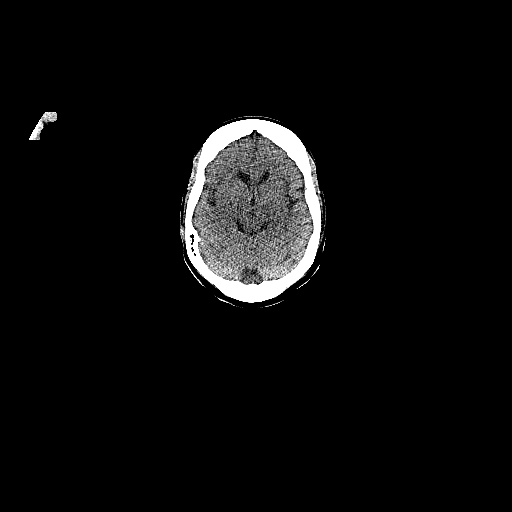

[1 of 25 positions shown; findings below may reference images not displayed]

FINDINGS: Mediastinal blood pool activity: SUV max

Liver activity: SUV max NA

NECK: No areas of abnormal hypermetabolism.

Incidental CT findings: No cervical adenopathy.

CHEST: Left axillary nodes measure maximally 8 mm and a S.U.V. max
of 2.3 on 63/3. Compare 1.6 cm and a S.U.V. max of 17.3 on the prior
exam (when remeasured).

A right subpectoral node measures 8 mm and a S.U.V. max of 1.8 on
62/3 versus similar in size and a S.U.V. max of 2.0 on the prior
exam.

Lateral left breast mass measures 8.5 x 6.6 cm and a S.U.V. max of
32.0 on 81/3. Compare 10.6 x 8.4 cm and a S.U.V. max of 66.8 on the
prior exam.

No pulmonary parenchymal hypermetabolism.

Incidental CT findings: Deferred to recent diagnostic CT. Right
Port-A-Cath tip high right atrium.

ABDOMEN/PELVIS: No abdominopelvic nodal or parenchymal
hypermetabolism identified.

No hypermetabolism to correspond to the subtle soft tissue
thickening within the left upper quadrant omentum. This is present
back to the diagnostic CT of 04/04/2020.

Incidental CT findings: Deferred to recent diagnostic CT. Mild
bilateral caliectasis is similar over multiple prior exams and is
without hydroureter. Possibly physiologic. Abdominal aortic
atherosclerosis. Fibroid uterus.

SKELETON: No abnormal marrow activity.

Incidental CT findings: none
IMPRESSION: 1. Response to therapy of left breast mass and ipsilateral
axillary/subpectoral nodal metastasis.
2. Similar small right subpectoral node with low-level
hypermetabolism, favored to be reactive.
3. No significant hypermetabolism to correspond to the chronic
omental interstitial thickening. Felt unlikely to represent
metastatic disease, given chronicity and lack of hypermetabolism.
Recommend attention on follow-up.
4. Fibroid uterus.

## 2022-01-24 IMAGING — DX DG CHEST 1V PORT
1 series · 1 of 1 positions shown · non-contrast
Comparison: 05/02/2020

CLINICAL DATA: Possible sepsis

EXAM:
PORTABLE CHEST 1 VIEW

[chest ap]
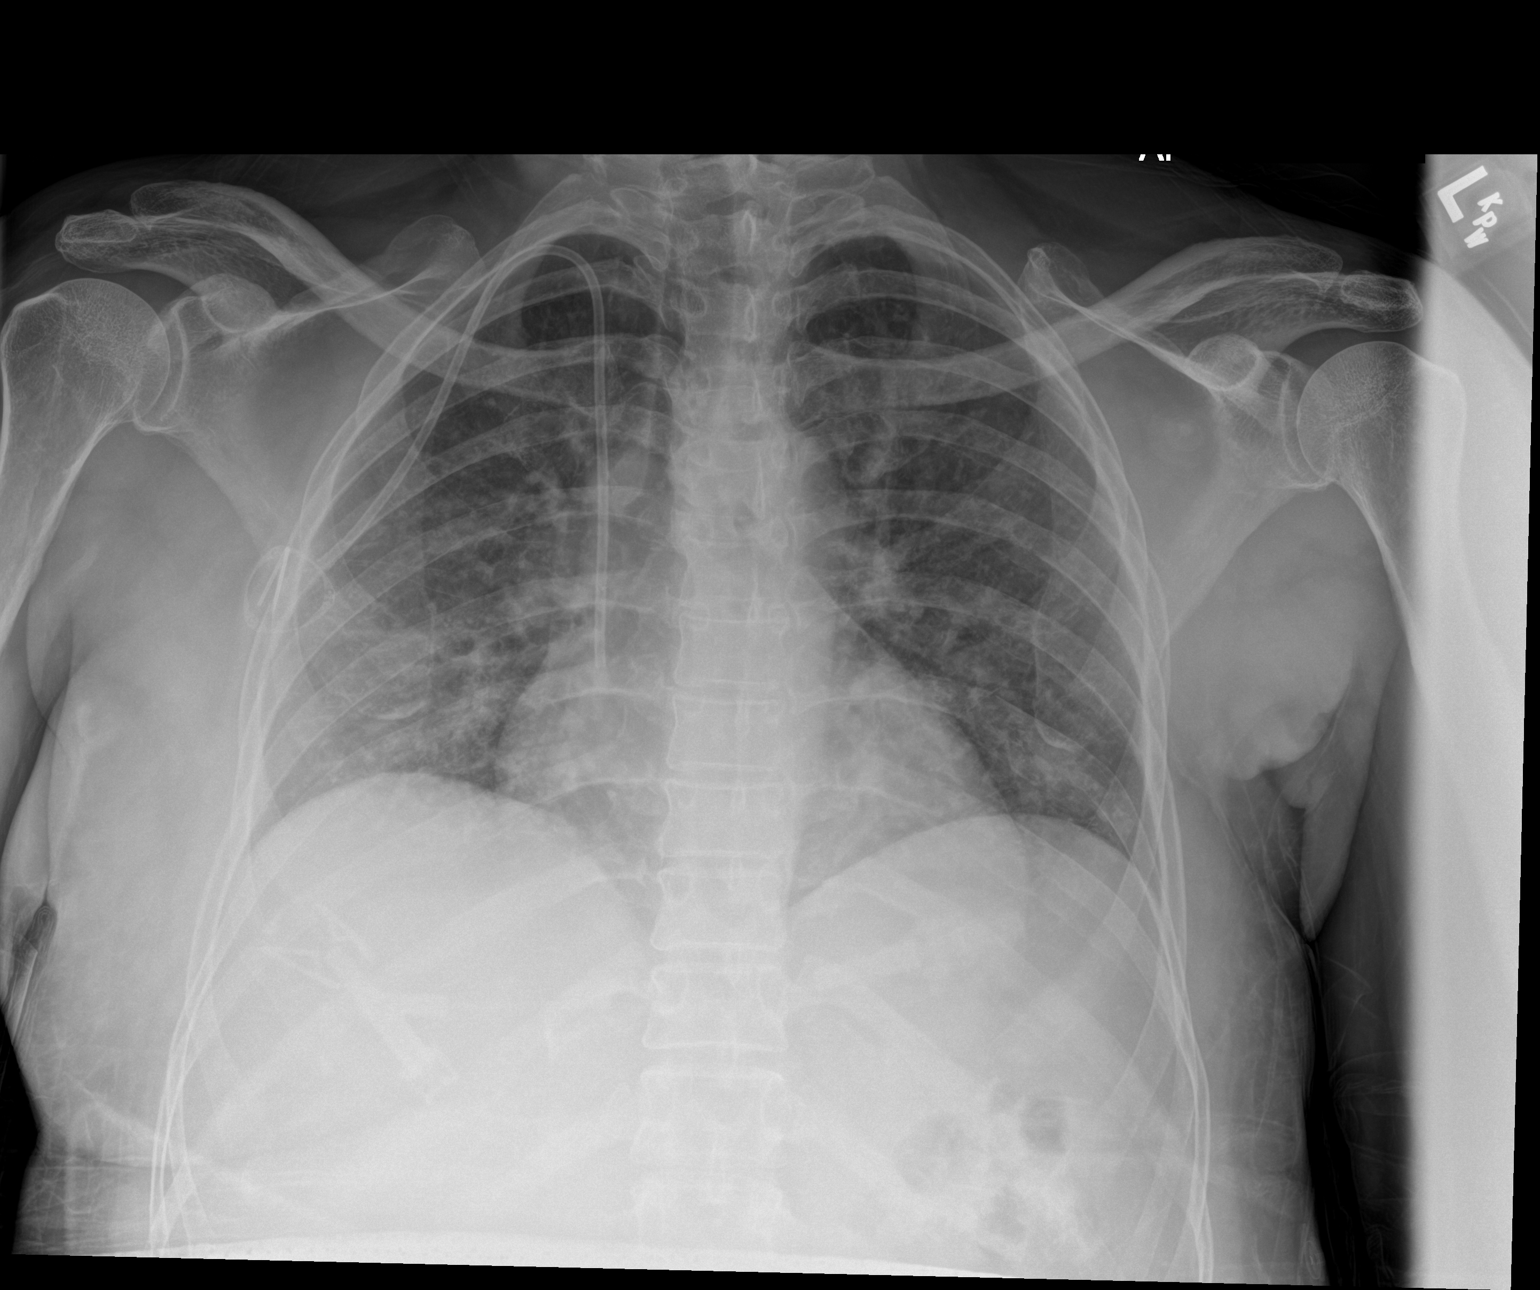

[1 of 1 positions shown; findings below may reference images not displayed]

FINDINGS: Right chest wall port catheter tip in similar position. Lung volumes
are low. No new consolidation. No pleural effusion. Stable
cardiomediastinal contours
IMPRESSION: No acute process in the chest.

## 2022-09-12 ENCOUNTER — Other Ambulatory Visit: Payer: Self-pay
